# Patient Record
Sex: Male | Born: 1940 | Race: White | Hispanic: No | Marital: Married | Smoking: Former smoker
Health system: Southern US, Community
[De-identification: ages and names within clinical notes are randomized; demographics above are authoritative.]

## PROBLEM LIST (undated history)

## (undated) DIAGNOSIS — I219 Acute myocardial infarction, unspecified: Secondary | ICD-10-CM

## (undated) DIAGNOSIS — K810 Acute cholecystitis: Secondary | ICD-10-CM

## (undated) DIAGNOSIS — Z9221 Personal history of antineoplastic chemotherapy: Secondary | ICD-10-CM

## (undated) DIAGNOSIS — I10 Essential (primary) hypertension: Secondary | ICD-10-CM

## (undated) DIAGNOSIS — I712 Thoracic aortic aneurysm, without rupture, unspecified: Secondary | ICD-10-CM

## (undated) DIAGNOSIS — E7800 Pure hypercholesterolemia, unspecified: Secondary | ICD-10-CM

## (undated) DIAGNOSIS — I639 Cerebral infarction, unspecified: Secondary | ICD-10-CM

## (undated) DIAGNOSIS — Q2112 Patent foramen ovale: Secondary | ICD-10-CM

## (undated) DIAGNOSIS — Z923 Personal history of irradiation: Secondary | ICD-10-CM

## (undated) DIAGNOSIS — C4492 Squamous cell carcinoma of skin, unspecified: Secondary | ICD-10-CM

## (undated) DIAGNOSIS — C4490 Unspecified malignant neoplasm of skin, unspecified: Secondary | ICD-10-CM

## (undated) DIAGNOSIS — C61 Malignant neoplasm of prostate: Secondary | ICD-10-CM

## (undated) DIAGNOSIS — R5383 Other fatigue: Secondary | ICD-10-CM

## (undated) DIAGNOSIS — S02609A Fracture of mandible, unspecified, initial encounter for closed fracture: Secondary | ICD-10-CM

## (undated) DIAGNOSIS — I2510 Atherosclerotic heart disease of native coronary artery without angina pectoris: Secondary | ICD-10-CM

## (undated) DIAGNOSIS — C4491 Basal cell carcinoma of skin, unspecified: Secondary | ICD-10-CM

## (undated) DIAGNOSIS — C109 Malignant neoplasm of oropharynx, unspecified: Principal | ICD-10-CM

## (undated) DIAGNOSIS — Q211 Atrial septal defect: Secondary | ICD-10-CM

## (undated) DIAGNOSIS — I739 Peripheral vascular disease, unspecified: Secondary | ICD-10-CM

## (undated) DIAGNOSIS — R202 Paresthesia of skin: Secondary | ICD-10-CM

## (undated) DIAGNOSIS — K819 Cholecystitis, unspecified: Secondary | ICD-10-CM

## (undated) DIAGNOSIS — R200 Anesthesia of skin: Secondary | ICD-10-CM

## (undated) HISTORY — DX: Basal cell carcinoma of skin, unspecified: C44.91

## (undated) HISTORY — DX: Malignant neoplasm of oropharynx, unspecified: C10.9

## (undated) HISTORY — PX: MULTIPLE TOOTH EXTRACTIONS: SHX2053

## (undated) HISTORY — PX: PEG TUBE PLACEMENT: SUR1034

## (undated) HISTORY — DX: Acute cholecystitis: K81.0

## (undated) HISTORY — DX: Malignant neoplasm of prostate: C61

## (undated) HISTORY — PX: ELBOW SURGERY: SHX618

## (undated) HISTORY — DX: Personal history of irradiation: Z92.3

## (undated) HISTORY — DX: Squamous cell carcinoma of skin, unspecified: C44.92

## (undated) HISTORY — PX: CATARACT EXTRACTION W/ INTRAOCULAR LENS  IMPLANT, BILATERAL: SHX1307

## (undated) HISTORY — DX: Atherosclerotic heart disease of native coronary artery without angina pectoris: I25.10

## (undated) HISTORY — DX: Unspecified malignant neoplasm of skin, unspecified: C44.90

## (undated) HISTORY — DX: Cholecystitis, unspecified: K81.9

## (undated) HISTORY — PX: TONSILLECTOMY: SUR1361

## (undated) HISTORY — DX: Cerebral infarction, unspecified: I63.9

## (undated) HISTORY — DX: Other fatigue: R53.83

---

## 1989-09-17 HISTORY — PX: CORONARY ARTERY BYPASS GRAFT: SHX141

## 1998-10-20 ENCOUNTER — Ambulatory Visit (HOSPITAL_COMMUNITY): Admission: RE | Admit: 1998-10-20 | Discharge: 1998-10-20 | Payer: Self-pay | Admitting: Family Medicine

## 1998-10-20 ENCOUNTER — Encounter: Payer: Self-pay | Admitting: Family Medicine

## 2000-01-24 ENCOUNTER — Ambulatory Visit (HOSPITAL_COMMUNITY): Admission: RE | Admit: 2000-01-24 | Discharge: 2000-01-24 | Payer: Self-pay | Admitting: Gastroenterology

## 2000-05-21 ENCOUNTER — Other Ambulatory Visit: Admission: RE | Admit: 2000-05-21 | Discharge: 2000-05-21 | Payer: Self-pay | Admitting: Plastic Surgery

## 2000-05-21 ENCOUNTER — Encounter (INDEPENDENT_AMBULATORY_CARE_PROVIDER_SITE_OTHER): Payer: Self-pay

## 2000-09-17 DIAGNOSIS — C61 Malignant neoplasm of prostate: Secondary | ICD-10-CM

## 2000-09-17 HISTORY — DX: Malignant neoplasm of prostate: C61

## 2000-09-17 HISTORY — PX: PROSTATECTOMY: SHX69

## 2000-12-23 ENCOUNTER — Ambulatory Visit (HOSPITAL_COMMUNITY): Admission: RE | Admit: 2000-12-23 | Discharge: 2000-12-23 | Payer: Self-pay | Admitting: Urology

## 2000-12-23 ENCOUNTER — Encounter: Payer: Self-pay | Admitting: Urology

## 2000-12-27 ENCOUNTER — Ambulatory Visit (HOSPITAL_COMMUNITY): Admission: RE | Admit: 2000-12-27 | Discharge: 2000-12-27 | Payer: Self-pay | Admitting: Urology

## 2000-12-27 ENCOUNTER — Encounter: Payer: Self-pay | Admitting: Urology

## 2001-01-14 ENCOUNTER — Inpatient Hospital Stay (HOSPITAL_COMMUNITY): Admission: RE | Admit: 2001-01-14 | Discharge: 2001-01-17 | Payer: Self-pay | Admitting: Urology

## 2001-01-14 ENCOUNTER — Encounter (INDEPENDENT_AMBULATORY_CARE_PROVIDER_SITE_OTHER): Payer: Self-pay | Admitting: Specialist

## 2002-09-11 ENCOUNTER — Encounter: Payer: Self-pay | Admitting: Family Medicine

## 2002-09-11 ENCOUNTER — Ambulatory Visit (HOSPITAL_COMMUNITY): Admission: RE | Admit: 2002-09-11 | Discharge: 2002-09-11 | Payer: Self-pay | Admitting: Family Medicine

## 2005-09-17 DIAGNOSIS — I639 Cerebral infarction, unspecified: Secondary | ICD-10-CM

## 2005-09-17 HISTORY — DX: Cerebral infarction, unspecified: I63.9

## 2007-04-09 ENCOUNTER — Ambulatory Visit: Payer: Self-pay | Admitting: Cardiology

## 2007-05-12 ENCOUNTER — Ambulatory Visit: Payer: Self-pay

## 2007-05-12 ENCOUNTER — Encounter: Payer: Self-pay | Admitting: Cardiology

## 2007-05-22 ENCOUNTER — Ambulatory Visit: Payer: Self-pay | Admitting: Cardiology

## 2007-05-22 LAB — CONVERTED CEMR LAB
ALT: 24 units/L (ref 0–53)
AST: 24 units/L (ref 0–37)
Albumin: 3.5 g/dL (ref 3.5–5.2)
Alkaline Phosphatase: 50 units/L (ref 39–117)
BUN: 22 mg/dL (ref 6–23)
Bilirubin, Direct: 0.1 mg/dL (ref 0.0–0.3)
CO2: 28 meq/L (ref 19–32)
Calcium: 9.2 mg/dL (ref 8.4–10.5)
Chloride: 108 meq/L (ref 96–112)
Cholesterol: 131 mg/dL (ref 0–200)
Creatinine, Ser: 1.1 mg/dL (ref 0.4–1.5)
GFR calc Af Amer: 86 mL/min
GFR calc non Af Amer: 71 mL/min
Glucose, Bld: 101 mg/dL — ABNORMAL HIGH (ref 70–99)
HDL: 53.4 mg/dL (ref >39.0)
LDL Cholesterol: 68 mg/dL (ref 0–99)
Potassium: 4.2 meq/L (ref 3.5–5.1)
Sodium: 140 meq/L (ref 135–145)
Total Bilirubin: 1.1 mg/dL (ref 0.3–1.2)
Total CHOL/HDL Ratio: 2.5
Total Protein: 6.2 g/dL (ref 6.0–8.3)
Triglycerides: 48 mg/dL (ref 0–149)
VLDL: 10 mg/dL (ref 0–40)

## 2007-05-27 ENCOUNTER — Ambulatory Visit: Payer: Self-pay | Admitting: Cardiology

## 2007-08-12 ENCOUNTER — Ambulatory Visit: Payer: Self-pay | Admitting: Cardiology

## 2007-09-04 ENCOUNTER — Ambulatory Visit: Payer: Self-pay | Admitting: Cardiology

## 2008-02-18 ENCOUNTER — Ambulatory Visit: Payer: Self-pay | Admitting: Cardiology

## 2008-03-02 ENCOUNTER — Encounter: Admission: RE | Admit: 2008-03-02 | Discharge: 2008-03-02 | Payer: Self-pay | Admitting: Neurology

## 2008-04-19 ENCOUNTER — Encounter: Payer: Self-pay | Admitting: Cardiology

## 2008-04-19 ENCOUNTER — Ambulatory Visit: Payer: Self-pay | Admitting: Internal Medicine

## 2008-04-19 ENCOUNTER — Ambulatory Visit (HOSPITAL_COMMUNITY): Admission: RE | Admit: 2008-04-19 | Discharge: 2008-04-19 | Payer: Self-pay | Admitting: Cardiology

## 2008-04-19 ENCOUNTER — Encounter: Payer: Self-pay | Admitting: Internal Medicine

## 2008-07-20 ENCOUNTER — Ambulatory Visit: Payer: Self-pay | Admitting: Cardiology

## 2008-07-20 LAB — CONVERTED CEMR LAB
BUN: 23 mg/dL (ref 6–23)
CO2: 28 meq/L (ref 19–32)
Calcium: 9.1 mg/dL (ref 8.4–10.5)
Chloride: 107 meq/L (ref 96–112)
Creatinine, Ser: 1 mg/dL (ref 0.4–1.5)
GFR calc Af Amer: 96 mL/min
GFR calc non Af Amer: 79 mL/min
Glucose, Bld: 91 mg/dL (ref 70–99)
Potassium: 4.6 meq/L (ref 3.5–5.1)
Sodium: 141 meq/L (ref 135–145)

## 2008-07-22 ENCOUNTER — Ambulatory Visit: Payer: Self-pay | Admitting: Cardiovascular Disease

## 2008-07-27 ENCOUNTER — Encounter: Admission: RE | Admit: 2008-07-27 | Discharge: 2008-07-27 | Payer: Self-pay | Admitting: Orthopedic Surgery

## 2008-08-19 ENCOUNTER — Ambulatory Visit: Payer: Self-pay | Admitting: Cardiology

## 2008-11-18 ENCOUNTER — Ambulatory Visit: Payer: Self-pay | Admitting: Cardiology

## 2008-12-10 ENCOUNTER — Encounter: Payer: Self-pay | Admitting: Cardiology

## 2009-04-17 HISTORY — PX: CHOLECYSTECTOMY: SHX55

## 2009-05-13 ENCOUNTER — Emergency Department (HOSPITAL_COMMUNITY): Admission: EM | Admit: 2009-05-13 | Discharge: 2009-05-13 | Payer: Self-pay | Admitting: Emergency Medicine

## 2009-05-13 ENCOUNTER — Encounter: Admission: RE | Admit: 2009-05-13 | Discharge: 2009-05-13 | Payer: Self-pay | Admitting: Family Medicine

## 2009-05-16 ENCOUNTER — Inpatient Hospital Stay (HOSPITAL_COMMUNITY): Admission: AD | Admit: 2009-05-16 | Discharge: 2009-05-18 | Payer: Self-pay

## 2009-05-17 ENCOUNTER — Encounter (INDEPENDENT_AMBULATORY_CARE_PROVIDER_SITE_OTHER): Payer: Self-pay

## 2009-05-23 ENCOUNTER — Emergency Department (HOSPITAL_COMMUNITY): Admission: EM | Admit: 2009-05-23 | Discharge: 2009-05-23 | Payer: Self-pay | Admitting: Emergency Medicine

## 2009-07-10 ENCOUNTER — Inpatient Hospital Stay (HOSPITAL_COMMUNITY): Admission: EM | Admit: 2009-07-10 | Discharge: 2009-07-16 | Payer: Self-pay | Admitting: Emergency Medicine

## 2009-07-28 ENCOUNTER — Encounter: Admission: RE | Admit: 2009-07-28 | Discharge: 2009-07-28 | Payer: Self-pay | Admitting: General Surgery

## 2009-08-17 ENCOUNTER — Encounter: Admission: RE | Admit: 2009-08-17 | Discharge: 2009-08-17 | Payer: Self-pay | Admitting: Family Medicine

## 2009-08-30 ENCOUNTER — Ambulatory Visit (HOSPITAL_COMMUNITY): Admission: RE | Admit: 2009-08-30 | Discharge: 2009-08-30 | Payer: Self-pay | Admitting: General Surgery

## 2009-11-23 ENCOUNTER — Encounter (INDEPENDENT_AMBULATORY_CARE_PROVIDER_SITE_OTHER): Payer: Self-pay | Admitting: *Deleted

## 2009-11-23 DIAGNOSIS — E785 Hyperlipidemia, unspecified: Secondary | ICD-10-CM | POA: Insufficient documentation

## 2009-11-23 DIAGNOSIS — Z9089 Acquired absence of other organs: Secondary | ICD-10-CM | POA: Insufficient documentation

## 2009-11-23 DIAGNOSIS — R071 Chest pain on breathing: Secondary | ICD-10-CM | POA: Insufficient documentation

## 2009-11-23 DIAGNOSIS — R5381 Other malaise: Secondary | ICD-10-CM | POA: Insufficient documentation

## 2009-11-23 DIAGNOSIS — R5383 Other fatigue: Secondary | ICD-10-CM

## 2009-11-24 DIAGNOSIS — Z951 Presence of aortocoronary bypass graft: Secondary | ICD-10-CM | POA: Insufficient documentation

## 2009-11-24 DIAGNOSIS — Z8546 Personal history of malignant neoplasm of prostate: Secondary | ICD-10-CM | POA: Insufficient documentation

## 2009-11-24 DIAGNOSIS — Z8679 Personal history of other diseases of the circulatory system: Secondary | ICD-10-CM | POA: Insufficient documentation

## 2009-11-24 DIAGNOSIS — I252 Old myocardial infarction: Secondary | ICD-10-CM | POA: Insufficient documentation

## 2009-11-25 ENCOUNTER — Encounter: Admission: RE | Admit: 2009-11-25 | Discharge: 2009-11-25 | Payer: Self-pay | Admitting: Family Medicine

## 2009-11-28 ENCOUNTER — Ambulatory Visit: Payer: Self-pay | Admitting: Cardiology

## 2009-11-28 DIAGNOSIS — Q211 Atrial septal defect: Secondary | ICD-10-CM | POA: Insufficient documentation

## 2009-11-29 ENCOUNTER — Ambulatory Visit (HOSPITAL_COMMUNITY): Admission: RE | Admit: 2009-11-29 | Discharge: 2009-11-29 | Payer: Self-pay | Admitting: General Surgery

## 2009-11-29 ENCOUNTER — Encounter: Payer: Self-pay | Admitting: Infectious Disease

## 2009-11-29 DIAGNOSIS — K6819 Other retroperitoneal abscess: Secondary | ICD-10-CM | POA: Insufficient documentation

## 2009-12-05 ENCOUNTER — Encounter: Payer: Self-pay | Admitting: Infectious Disease

## 2009-12-07 ENCOUNTER — Encounter: Admission: RE | Admit: 2009-12-07 | Discharge: 2009-12-07 | Payer: Self-pay | Admitting: General Surgery

## 2009-12-07 ENCOUNTER — Encounter: Payer: Self-pay | Admitting: Internal Medicine

## 2009-12-12 ENCOUNTER — Encounter (INDEPENDENT_AMBULATORY_CARE_PROVIDER_SITE_OTHER): Payer: Self-pay | Admitting: *Deleted

## 2009-12-20 ENCOUNTER — Encounter: Admission: RE | Admit: 2009-12-20 | Discharge: 2009-12-20 | Payer: Self-pay | Admitting: General Surgery

## 2009-12-27 ENCOUNTER — Ambulatory Visit: Payer: Self-pay | Admitting: Internal Medicine

## 2009-12-28 ENCOUNTER — Encounter: Payer: Self-pay | Admitting: Internal Medicine

## 2010-01-02 ENCOUNTER — Encounter: Payer: Self-pay | Admitting: Infectious Disease

## 2010-01-22 ENCOUNTER — Telehealth: Payer: Self-pay | Admitting: Internal Medicine

## 2010-01-23 ENCOUNTER — Ambulatory Visit: Payer: Self-pay | Admitting: Internal Medicine

## 2010-01-23 LAB — CONVERTED CEMR LAB
BUN: 15 mg/dL (ref 6–23)
CO2: 23 meq/L (ref 19–32)
Calcium: 8.6 mg/dL (ref 8.4–10.5)
Chloride: 104 meq/L (ref 96–112)
Creatinine, Ser: 0.73 mg/dL (ref 0.40–1.50)
Glucose, Bld: 188 mg/dL — ABNORMAL HIGH (ref 70–99)
HCT: 36.8 % — ABNORMAL LOW (ref 39.0–52.0)
Hemoglobin: 11.4 g/dL — ABNORMAL LOW (ref 13.0–17.0)
MCHC: 31 g/dL (ref 30.0–36.0)
MCV: 81.4 fL (ref 78.0–100.0)
Platelets: 345 10*3/uL (ref 150–400)
Potassium: 4.1 meq/L (ref 3.5–5.3)
RBC: 4.52 M/uL (ref 4.22–5.81)
RDW: 16.4 % — ABNORMAL HIGH (ref 11.5–15.5)
Sodium: 138 meq/L (ref 135–145)
WBC: 9.5 10*3/uL (ref 4.0–10.5)

## 2010-01-24 ENCOUNTER — Encounter: Payer: Self-pay | Admitting: Internal Medicine

## 2010-01-31 ENCOUNTER — Telehealth (INDEPENDENT_AMBULATORY_CARE_PROVIDER_SITE_OTHER): Payer: Self-pay | Admitting: *Deleted

## 2010-02-07 ENCOUNTER — Encounter: Payer: Self-pay | Admitting: Internal Medicine

## 2010-03-15 ENCOUNTER — Encounter: Payer: Self-pay | Admitting: Internal Medicine

## 2010-03-23 ENCOUNTER — Telehealth (INDEPENDENT_AMBULATORY_CARE_PROVIDER_SITE_OTHER): Payer: Self-pay | Admitting: *Deleted

## 2010-04-17 HISTORY — PX: EXPLORATORY LAPAROTOMY: SUR591

## 2010-04-21 ENCOUNTER — Inpatient Hospital Stay (HOSPITAL_COMMUNITY): Admission: EM | Admit: 2010-04-21 | Discharge: 2010-05-01 | Payer: Self-pay | Admitting: Emergency Medicine

## 2010-04-21 ENCOUNTER — Encounter: Admission: RE | Admit: 2010-04-21 | Discharge: 2010-04-21 | Payer: Self-pay | Admitting: Family Medicine

## 2010-04-27 ENCOUNTER — Ambulatory Visit: Payer: Self-pay | Admitting: Internal Medicine

## 2010-05-02 ENCOUNTER — Encounter: Payer: Self-pay | Admitting: Internal Medicine

## 2010-05-05 ENCOUNTER — Encounter: Payer: Self-pay | Admitting: Internal Medicine

## 2010-05-09 ENCOUNTER — Telehealth: Payer: Self-pay | Admitting: Internal Medicine

## 2010-05-09 ENCOUNTER — Encounter: Payer: Self-pay | Admitting: Internal Medicine

## 2010-05-16 ENCOUNTER — Ambulatory Visit: Payer: Self-pay | Admitting: Internal Medicine

## 2010-05-24 ENCOUNTER — Encounter: Payer: Self-pay | Admitting: Internal Medicine

## 2010-05-30 ENCOUNTER — Ambulatory Visit: Payer: Self-pay | Admitting: Internal Medicine

## 2010-06-06 ENCOUNTER — Encounter: Payer: Self-pay | Admitting: Internal Medicine

## 2010-07-05 ENCOUNTER — Encounter: Payer: Self-pay | Admitting: Internal Medicine

## 2010-08-23 ENCOUNTER — Encounter: Payer: Self-pay | Admitting: Cardiology

## 2010-09-05 ENCOUNTER — Ambulatory Visit: Payer: Self-pay | Admitting: Cardiology

## 2010-09-05 ENCOUNTER — Encounter: Payer: Self-pay | Admitting: Cardiology

## 2010-09-05 DIAGNOSIS — I712 Thoracic aortic aneurysm, without rupture: Secondary | ICD-10-CM | POA: Insufficient documentation

## 2010-09-17 DIAGNOSIS — C109 Malignant neoplasm of oropharynx, unspecified: Secondary | ICD-10-CM

## 2010-09-17 HISTORY — DX: Malignant neoplasm of oropharynx, unspecified: C10.9

## 2010-10-08 ENCOUNTER — Encounter: Payer: Self-pay | Admitting: Orthopedic Surgery

## 2010-10-08 ENCOUNTER — Encounter: Payer: Self-pay | Admitting: Internal Medicine

## 2010-10-15 LAB — CONVERTED CEMR LAB
ALT: 21 units/L (ref 0–53)
AST: 17 units/L (ref 0–37)
Albumin: 3.7 g/dL (ref 3.5–5.2)
Alkaline Phosphatase: 65 units/L (ref 39–117)
Bilirubin, Direct: 0.1 mg/dL (ref 0.0–0.3)
Cholesterol: 182 mg/dL (ref 0–200)
HDL: 58.4 mg/dL (ref >39.0)
LDL Cholesterol: 107 mg/dL — ABNORMAL HIGH (ref 0–99)
Total Bilirubin: 1.3 mg/dL — ABNORMAL HIGH (ref 0.3–1.2)
Total CHOL/HDL Ratio: 3.1
Total Protein: 7.2 g/dL (ref 6.0–8.3)
Triglycerides: 81 mg/dL (ref 0–149)
VLDL: 16 mg/dL (ref 0–40)

## 2010-10-19 NOTE — Miscellaneous (Signed)
Summary: [facility]:  [facility]:   Imported By: Florinda Marker 06/08/2010 16:05:12  _____________________________________________________________________  External Attachment:    Type:   Image     Comment:   External Document

## 2010-10-19 NOTE — Assessment & Plan Note (Signed)
Summary: 2WKS F/U [MKJ]   Primary Provider:  Tiburcio Noble  CC:  follow-up visit, more energy, and appetite improving.  History of Present Illness: Jason Noble is in for his routine visit.  He continues to take Augmentin but his last dose was this morning.  He has not had any problems tolerating it.  He's not had any fever, chills, or sweats.  His appetite has improved and he has gained another 5 pounds.  He is not had any further abdominal pain.   Prior Medication List:  AUGMENTIN 875-125 MG TABS (AMOXICILLIN-POT CLAVULANATE) Take 1 tablet by mouth two times a day LIPITOR 40 MG TABS (ATORVASTATIN CALCIUM) Take 1 tablet by mouth once a day per PCP, Dr. Dossie Noble IBUPROFEN 200 MG TABS (IBUPROFEN) Take 1 tablet by mouth every 6 hours per PCP, Dr. Dossie Noble CALCIUM 500 MG TABS (CALCIUM) Take 1 tablet by mouth two times a day VITAMIN C 500 MG TABS (ASCORBIC ACID) Take 1 tablet by mouth two times a day MULTIVITAMINS  TABS (MULTIPLE VITAMIN) Take 1 tablet by mouth once a day VITAMIN E 400 UNIT CAPS (VITAMIN E) Take 1 capsule by mouth two times a day GARLIC OIL 1000 MG CAPS (GARLIC) Take 1 capsule by mouth once a day * UBIG-50 CAPSULE Take 1 capsule by mouth once a day FISH OIL 1000 MG CAPS (OMEGA-3 FATTY ACIDS) Take 1 capsule by mouth two times a day VITAMIN D 2000 UNIT CAPS (CHOLECALCIFEROL) Take 1 capsule by mouth once a day VITAMIN B-2 100 MG TABS (RIBOFLAVIN) Take 1 tablet by mouth once a day GLUCOSAMINE-CHONDROITIN 500-400 MG CAPS (GLUCOSAMINE-CHONDROITIN) Take 1 capsule by mouth once a day PYCNOGENOL COMPLEX  TABS (MISC NATURAL PRODUCTS) Take 1 tablet by mouth once a day RED YEAST RICE EXTRACT 600 MG CAPS (RED YEAST RICE EXTRACT) Take 1 capsule by mouth once a day * MAX GXK (GLUTAMINE, N- ACETYL) two times a day TURMERIC CURCUMIN  CAPS (MISC NATURAL PRODUCTS) Take 1 capsule by mouth two times a day ASPIRIN 81 MG TBEC (ASPIRIN) Take one tablet by mouth daily   Current Allergies (reviewed  today): No known allergies  Vital Signs:  Patient profile:   70 year old male Height:      68 inches (172.72 cm) Weight:      172.5 pounds (78.41 kg) BMI:     26.32 Temp:     97.9 degrees F (36.61 degrees C) oral Pulse rate:   66 / minute BP sitting:   122 / 75  (left arm) Cuff size:   regular  Vitals Entered By: Jason Maduro RN (May 30, 2010 3:05 PM) CC: follow-up visit, more energy, appetite improving Is Patient Diabetic? No Pain Assessment Patient in pain? no      Nutritional Status BMI of 25 - 29 = overweight Nutritional Status Detail appetite "improving"  Have you ever been in a relationship where you felt threatened, hurt or afraid?not asked   Does patient need assistance? Functional Status Self care Ambulation Normal   Physical Exam  General:  alert and well-nourished.   Lungs:  normal breath sounds, no crackles, and no wheezes.   Heart:  normal rate, regular rhythm, and no murmur.   Abdomen:  The incision appears to be healing well without any signs of infection.  His abdomen is soft and nontender.   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 He will stop  Augmentin therapy today. Since he has had such a difficult time with recurring infection over the past year her I have agreed to have him travel on his upcoming trip to [place] with a two week supply of Augmentin.  He should restart it if he has a definite signs of recurrent infection prior to returning to this country.  He knows to call me if he has any concerns about recurrent infection before his next visit. Orders: Est. Patient Level III (16109)  Patient Instructions: 1)  Please schedule a follow-up appointment in 2 months.

## 2010-10-19 NOTE — Letter (Signed)
Summary: Telephone triage form  Telephone triage form   Imported By: Kassie Mends 10/31/2009 11:31:22  _____________________________________________________________________  External Attachment:    Type:   Image     Comment:   External Document

## 2010-10-19 NOTE — Consult Note (Signed)
Summary: [facility]  [facility]   Imported By: Florinda Marker 02/15/2010 13:59:34  _____________________________________________________________________  External Attachment:    Type:   Image     Comment:   External Document

## 2010-10-19 NOTE — Progress Notes (Signed)
Summary: Pt. not eatting, speak to MD  Phone Note Call from Patient Call back at Home Phone [phone]   Caller: Spouse Call For: Jason Noble, [facility] Reason for Call: Talk to Doctor Summary of Call: Wife called stating that her husband was not eating well since he's been on the Invance IV, she states that he is not getting in 500 calories a day. She  wants to know if she needs a return  appointment with Dr. Orvan Falconer, he is going to see the surgeon today. Initial call taken by: Starleen Arms CMA,  May 08, 2010 9:18 AM      Follow-up for Phone Call        [name] has been having severe problems with nausea since he left the hospital on IV Invanz. He saw Dr. Andrey Campanile yesterday who called me to discuss the situation.  We agreed to stop the Invanz and switch him to oral Augmentin.  He says he is already feeling much better with resolution of his nausea.  He has been able to eat today.  I asked his wife to call me tomorrow.  If he continues to do well I will have his PICC removed.  He will continue his Augmentin at least until his visit with me in one week. Follow-up by: Jason Asters MD,  May 09, 2010 12:06 PM    New/Updated Medications: AUGMENTIN 875-125 MG TABS (AMOXICILLIN-POT CLAVULANATE) Take 1 tablet by mouth two times a day  Appended Document: Pt. not eatting, speak to MD Spoke with [facility] RN.  She has already spoken with Dr. Orvan Falconer today.  She was instructed to pull the [facility].

## 2010-10-19 NOTE — Consult Note (Signed)
Summary: [facility]:   [facility]:   Imported By: Florinda Marker 03/08/2010 15:52:54  _____________________________________________________________________  External Attachment:    Type:   Image     Comment:   External Document

## 2010-10-19 NOTE — Progress Notes (Signed)
  Phone Note Call from Patient   Caller: Patient Reason for Call: Acute Illness Summary of Call: Feeling worse recently with return of R sided pain. Worried that infection is back. No fever. I will see him 5/9 at 10:45 AM. Initial call taken by: Cliffton Asters MD,  Jan 22, 2010 2:10 PM

## 2010-10-19 NOTE — Assessment & Plan Note (Addendum)
Summary: f21m   Visit Type:  Follow-up Primary Provider:  Tiburcio Pea  CC:  Some chest tightness.  History of Present Illness: Jason Noble is doing pretty well overall.  He has noted a lit bit of chest tightness.  He has been through an ordeal with regard to his abdominal surgery, but seems overall improved from that standpoint.  We discussed his coronary situation in detail, and his choice was to defer with early followup in the absence of significant symptoms.  We have had previous communication from neuro, and there has been no recommendation regarding PFO closure in light of his CNS event.  Also, with all that went on last year after his abdominal procedure he did not have follow up.  Generally he is doing pretty well.   Problems Prior to Update: 1)  Other Retroperitoneal Abscess  (ICD-567.38) 2)  Patent Foramen Ovale  (ICD-745.5) 3)  Cad, Artery Bypass Graft  (ICD-414.04) 4)  Myocardial Infarction, Hx of  (ICD-412) 5)  Chest Wall Pain, Acute  (ICD-786.52) 6)  Hypercholesterolemia  (ICD-272.0) 7)  Cerebrovascular Accident, Hx of  (ICD-V12.50) 8)  Cholecystectomy, Hx of  (ICD-V45.79) 9)  Malaise and Fatigue  (ICD-780.79) 10)  Prostate Cancer, Hx of  (ICD-V10.46)  Current Medications (verified): 1)  Lipitor 40 Mg Tabs (Atorvastatin Calcium) .... Take 1 Tablet By Mouth Once A Day Per Pcp, Dr. Dossie Arbour 2)  Ibuprofen 200 Mg Tabs (Ibuprofen) .... Take 1 Tablet By Mouth Every 6 Hours Per Pcp, Dr. Dossie Arbour 3)  Calcium 500 Mg Tabs (Calcium) .... Take 1 Tablet By Mouth Two Times A Day 4)  Vitamin C 500 Mg Tabs (Ascorbic Acid) .... Take 1 Tablet By Mouth Two Times A Day 5)  Multivitamins  Tabs (Multiple Vitamin) .... Take 1 Tablet By Mouth Once A Day 6)  Vitamin E 400 Unit Caps (Vitamin E) .... Take 1 Capsule By Mouth Two Times A Day 7)  Garlic Oil 1000 Mg Caps (Garlic) .... Take 1 Capsule By Mouth Once A Day 8)  Ubig-50 Capsule .... Take 1 Capsule By Mouth Once A Day 9)  Fish Oil 1000 Mg Caps  (Omega-3 Fatty Acids) .... Take 1 Capsule By Mouth Two Times A Day 10)  Vitamin D 2000 Unit Caps (Cholecalciferol) .... Take 1 Capsule By Mouth Once A Day 11)  Vitamin B-2 100 Mg Tabs (Riboflavin) .... Take 1 Tablet By Mouth Once A Day 12)  Glucosamine-Chondroitin 500-400 Mg Caps (Glucosamine-Chondroitin) .... Take 1 Capsule By Mouth Once A Day 13)  Pycnogenol Complex  Tabs (Misc Natural Products) .... Take 1 Tablet By Mouth Once A Day 14)  Red Yeast Rice Extract 600 Mg Caps (Red Yeast Rice Extract) .... Take 1 Capsule By Mouth Once A Day 15)  Max Gxk (Glutamine, N- Acetyl) .... Two Times A Day 16)  Turmeric Curcumin  Caps (Misc Natural Products) .... Take 1 Capsule By Mouth Two Times A Day 17)  Aspirin 81 Mg Tbec (Aspirin) .... Take One Tablet By Mouth Daily  Allergies (verified): No Known Drug Allergies  Past History:  Past Medical History: Current Problems:  CAD, ARTERY BYPASS GRAFT (ICD-414.04)- prior coronary   revascularization surgery in 1991 with saphenous vein graft   occlusion and successful percutaneous stenting in May 2008.  MYOCARDIAL INFARCTION, HX OF (ICD-412) CHEST WALL PAIN, ACUTE (ICD-786.52) HYPERCHOLESTEROLEMIA (ICD-272.0) CEREBROVASCULAR ACCIDENT, HX OF (ICD-V12.50) CHOLECYSTECTOMY, HX OF (ICD-V45.79) MALAISE AND FATIGUE (ICD-780.79) PROSTATE CANCER, HX OF (ICD-V10.46) Thoracic aortic enlargement  Vital Signs:  Patient profile:  70 year old male Height:      68 inches Weight:      190.75 pounds BMI:     29.11 Pulse rate:   64 / minute Pulse rhythm:   regular Resp:     18 per minute BP sitting:   154 / 80  (left arm) Cuff size:   large  Vitals Entered By: Vikki Ports (September 05, 2010 11:04 AM)  Physical Exam  General:  Well developed, well nourished, in no acute distress. Head:  normocephalic and atraumatic Eyes:  PERRLA/EOM intact; conjunctiva and lids normal. Chest Wall:  MS well healed Lungs:  Clear bilaterally to auscultation and  percussion. Heart:  PMI non displaced.  Normal S1 and S2.  No definite murmur noted.   Abdomen:  Bowel sounds positive; abdomen soft and non-tender without masses, organomegaly, or hernias noted. No hepatosplenomegaly. Pulses:  pulses normal in all 4 extremities Extremities:  No clubbing or cyanosis. Neurologic:  Alert and oriented x 3.   EKG  Procedure date:  09/05/2010  Findings:      NSR. WNL.  Impression & Recommendations:  Problem # 1:  CAD, ARTERY BYPASS GRAFT (ICD-414.04) Has some mild chest pain from time to time.  Not exertional.  He prefers to be seen in early followup, and will contact us in the case that any symptoms recur.   Has prior SVG intervention.   His updated medication list for this problem includes:    Aspirin Ec 325 Mg Tbec (Aspirin) .Marland Kitchen... Take one tablet by mouth daily  Orders: EKG w/ Interpretation (93000)  Problem # 2:  PATENT FORAMEN OVALE (ICD-745.5) Stable symptoms, without recurrent neurologic events.  Recommendation was for continued antiplatelet treatment.    Problem # 3:  HYPERCHOLESTEROLEMIA (ICD-272.0) Followed by Dr. Tiburcio Pea.  His updated medication list for this problem includes:    Lipitor 40 Mg Tabs (Atorvastatin calcium) .Marland Kitchen... Take 1 tablet by mouth once a day per pcp, dr. 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  Will need repeat study.   Patient Instructions: 1)  Your physician has recommended you make the following change in your medication: INCREASE Aspirin to 325mg  daily 2)  Your physician recommends that you have a FASTING lipid profile with your PCP.  3)  Your physician recommends that you schedule a follow-up appointment in: 2 MONTHS

## 2010-10-19 NOTE — Progress Notes (Signed)
Summary: Pt. canceled CT.  Going to [facility] to see Dr. Marilynn Rail  Phone Note Outgoing Call   Call placed by: Jennet Maduro RN,  Jan 31, 2010 12:06 PM Call placed to: Patient Action Taken: Phone Call Completed Summary of Call: RN called pt to determine status of CT appt.  Pt. stated that he cancelled the CT appt. after discussion w/ Dr. Andrey Campanile.  Pt. requested referral to [facility] to a "biliary" specialist.  Pt. to see Dr. Marilynn Rail @ [facility] 02/07/2010.  Dr. Orvan Falconer informed.  RN offered pt. opportunity to sign ROI for Dr. Blair Dolphin OV notes to be sent to Dr. Marilynn Rail.  Pt. to decide whether to sign ROI. Jennet Maduro RN  Jan 31, 2010 12:09 PM

## 2010-10-19 NOTE — Consult Note (Signed)
Summary: New Pt. Referral: Dr. Andrey Campanile  New Pt. Referral: Dr. Andrey Campanile   Imported By: Florinda Marker 12/29/2009 15:49:34  _____________________________________________________________________  External Attachment:    Type:   Image     Comment:   External Document

## 2010-10-19 NOTE — Assessment & Plan Note (Signed)
Summary: f1y  Medications Added VITAMIN E 400 UNIT CAPS (VITAMIN E) Take 1 capsule by mouth two times a day * UBIG-50 CAPSULE Take 1 capsule by mouth once a day MULTIVITAMINS  TABS (MULTIPLE VITAMIN) Take 1 tablet by mouth once a day FISH OIL 1000 MG CAPS (OMEGA-3 FATTY ACIDS) Take 1 capsule by mouth two times a day VITAMIN D 2000 UNIT CAPS (CHOLECALCIFEROL) Take 1 capsule by mouth once a day VITAMIN B-2 100 MG TABS (RIBOFLAVIN) Take 1 tablet by mouth once a day GLUCOSAMINE-CHONDROITIN 500-400 MG CAPS (GLUCOSAMINE-CHONDROITIN) Take 1 capsule by mouth once a day PYCNOGENOL COMPLEX  TABS (MISC NATURAL PRODUCTS) Take 1 tablet by mouth once a day RED YEAST RICE EXTRACT 600 MG CAPS (RED YEAST RICE EXTRACT) Take 1 capsule by mouth once a day * MAX GXK (GLUTAMINE, N- ACETYL) two times a day TURMERIC CURCUMIN  CAPS (MISC NATURAL PRODUCTS) Take 1 capsule by mouth two times a day ROYAL JELLY 200 MG CAPS (ROYAL JELLY) Take 1 capsule by mouth once a day AUGMENTIN 500-125 MG TABS (AMOXICILLIN-POT CLAVULANATE) Take 1 tablet by mouth two times a day ASPIRIN 81 MG TBEC (ASPIRIN) Take one tablet by mouth daily      Allergies Added: NKDA  Visit Type:  1 year follow up Primary Provider:  Tiburcio Pea  CC:  Irregular heart beat sometimes.  History of Present Illness: Has been having trouble since his orginal gallstone procedure.  Cardiac situation has been ok.  Was on antibiotics.  Had to have an additional surgery laparoscopic surgery.  Had to wait on having plavix.  He develped some fever.  He had night sweats, wtih fevers up to 103.9.  Was seen by Dr. Andrey Campanile and Dr. Tiburcio Pea.  His wbc then came down.  He was splitting wood with his son, and the next day he had pain in his right chest in the back.  Pain over R back is still hurting.  Had CT scan and found an abscess in right upper quadrant.  He is awaiting to go back and see Dr. Andrey Campanile at this time.  His fevers are down currently, and his wbc is apparently down.   While we were doing the office visit, Dr. Andrey Campanile called him on his cell phone.    Current Medications (verified): 1)  Lipitor 40 Mg Tabs (Atorvastatin Calcium) .... Take 1 Tablet By Mouth Once A Day Per Pcp, Dr. Dossie Arbour 2)  Ibuprofen 200 Mg Tabs (Ibuprofen) .... Take 1 Tablet By Mouth Every 6 Hours Per Pcp, Dr. Dossie Arbour 3)  Calcium 500 Mg Tabs (Calcium) .... Take 1 Tablet By Mouth Two Times A Day 4)  Vitamin C 500 Mg Tabs (Ascorbic Acid) .... Take 1 Tablet By Mouth Two Times A Day 5)  Multivitamins  Tabs (Multiple Vitamin) .... Take 1 Tablet By Mouth Once A Day 6)  Vitamin E 400 Unit Caps (Vitamin E) .... Take 1 Capsule By Mouth Two Times A Day 7)  Garlic Oil 1000 Mg Caps (Garlic) .... Take 1 Capsule By Mouth Once A Day 8)  Nitrostat 0.4 Mg Subl (Nitroglycerin) .Marland Kitchen.. 1 Tablet Under Tongue At Onset of Chest Pain; You May Repeat Every 5 Minutes For Up To 3 Doses. 9)  Ubig-50 Capsule .... Take 1 Capsule By Mouth Once A Day 10)  Multivitamins  Tabs (Multiple Vitamin) .... Take 1 Tablet By Mouth Once A Day 11)  Fish Oil 1000 Mg Caps (Omega-3 Fatty Acids) .... Take 1 Capsule By Mouth Two Times A Day  12)  Vitamin D 2000 Unit Caps (Cholecalciferol) .... Take 1 Capsule By Mouth Once A Day 13)  Vitamin B-2 100 Mg Tabs (Riboflavin) .... Take 1 Tablet By Mouth Once A Day 14)  Glucosamine-Chondroitin 500-400 Mg Caps (Glucosamine-Chondroitin) .... Take 1 Capsule By Mouth Once A Day 15)  Pycnogenol Complex  Tabs (Misc Natural Products) .... Take 1 Tablet By Mouth Once A Day 16)  Red Yeast Rice Extract 600 Mg Caps (Red Yeast Rice Extract) .... Take 1 Capsule By Mouth Once A Day 17)  Max Gxk (Glutamine, N- Acetyl) .... Two Times A Day 18)  Turmeric Curcumin  Caps (Misc Natural Products) .... Take 1 Capsule By Mouth Two Times A Day 19)  Royal Jelly 200 Mg Caps (Royal Jelly) .... Take 1 Capsule By Mouth Once A Day 20)  Augmentin 500-125 Mg Tabs (Amoxicillin-Pot Clavulanate) .... Take 1 Tablet By Mouth Two Times  A Day 21)  Aspirin 81 Mg Tbec (Aspirin) .... Take One Tablet By Mouth Daily  Allergies (verified): No Known Drug Allergies  Vital Signs:  Patient profile:   70 year old male Height:      68 inches Weight:      177.50 pounds BMI:     27.09 Pulse rate:   87 / minute Pulse rhythm:   regular Resp:     18 per minute BP sitting:   124 / 70  (left arm) Cuff size:   large  Vitals Entered By: Vikki Ports (November 28, 2009 2:21 PM)  Physical Exam  General:  Well developed, well nourished, in no acute distress. Head:  normocephalic and atraumatic Eyes:  PERRLA/EOM intact; conjunctiva and lids normal. Lungs:  Clear bilaterally to auscultation and percussion. Heart:  PMI non displaced.   Normal S1 and S2.  No S4 gallop.     EKG  Procedure date:  11/28/2009  Findings:      NSR.  Nonspecific ST abnormality.  Impression & Recommendations:  Problem # 1:  CAD, ARTERY BYPASS GRAFT (ICD-414.04)  The following medications were removed from the medication list:    Plavix 75 Mg Tabs (Clopidogrel bisulfate) .Marland Kitchen... Take one tablet by mouth daily His updated medication list for this problem includes:    Nitrostat 0.4 Mg Subl (Nitroglycerin) .Marland Kitchen... 1 tablet under tongue at onset of chest pain; you may repeat every 5 minutes for up to 3 doses.    Aspirin 81 Mg Tbec (Aspirin) .Marland Kitchen... Take one tablet by mouth daily  Orders: EKG w/ Interpretation (93000)  Problem # 2:  PATENT FORAMEN OVALE (ICD-745.5)  Has patent foramen with prior small CVA.  Was placed on antiplatelet agents.  Previously spoke with Dr. Pearlean Brownie, and he has no further episodes.    Orders: EKG w/ Interpretation (93000)  Patient Instructions: 1)  Your physician recommends that you continue on your current medications as directed. Please refer to the Current Medication list given to you today. 2)  Your physician wants you to follow-up in: 6 MONTHS.   You will receive a reminder letter in the mail two months in advance. If you don't  receive a letter, please call our office to schedule the follow-up appointment. 3)  Your physician has requested that you have an echocardiogram in 6 MONTHS.  Echocardiography is a painless test that uses sound waves to create images of your heart. It provides your doctor with information about the size and shape of your heart and how well your heart's chambers and valves are working.  This procedure takes  approximately one hour. There are no restrictions for this procedure.

## 2010-10-19 NOTE — Medication Information (Signed)
Summary: [facility]: RX  [facility]: RX   Imported By: Florinda Marker 05/10/2010 12:22:44  _____________________________________________________________________  External Attachment:    Type:   Image     Comment:   External Document

## 2010-10-19 NOTE — Miscellaneous (Signed)
Summary: [facility]: Home Health Cert. & Plan Of Care  [facility]: Home Health Cert. & Plan Of Care   Imported By: Florinda Marker 05/26/2010 13:58:02  _____________________________________________________________________  External Attachment:    Type:   Image     Comment:   External Document

## 2010-10-19 NOTE — Assessment & Plan Note (Signed)
Summary: 11:30 hsfu need chart/liver abscess   Primary Provider:  Tiburcio Pea  CC:  HSFU, taking augmentin now, and not causing problems with taste like the Invanz did while he was on that.  History of Present Illness: Jason Noble is in for his hospital follow-up visit.  He recently had another recurrence of his polymicrobial intra-abdominal abscess these and was rehospitalized.  He underwent exploratory laparotomy with drainage of the abscess he is on August 10.  He was discharged on IV Invanz but developed anorexia, nausea and intermittent vomiting as soon as he began it.  I spoke with Dr. Gaynelle Adu last week and we agreed to switch him to oral Augmentin.  Since that time he has felt much better.  He has regained his appetite and the nausea and vomiting have resolved.  He is back gaining weight. He has not had any fever or chills.   Prior Medication List:  AUGMENTIN 875-125 MG TABS (AMOXICILLIN-POT CLAVULANATE) Take 1 tablet by mouth two times a day LIPITOR 40 MG TABS (ATORVASTATIN CALCIUM) Take 1 tablet by mouth once a day per PCP, Dr. Dossie Arbour IBUPROFEN 200 MG TABS (IBUPROFEN) Take 1 tablet by mouth every 6 hours per PCP, Dr. Dossie Arbour CALCIUM 500 MG TABS (CALCIUM) Take 1 tablet by mouth two times a day VITAMIN C 500 MG TABS (ASCORBIC ACID) Take 1 tablet by mouth two times a day MULTIVITAMINS  TABS (MULTIPLE VITAMIN) Take 1 tablet by mouth once a day VITAMIN E 400 UNIT CAPS (VITAMIN E) Take 1 capsule by mouth two times a day GARLIC OIL 1000 MG CAPS (GARLIC) Take 1 capsule by mouth once a day * UBIG-50 CAPSULE Take 1 capsule by mouth once a day FISH OIL 1000 MG CAPS (OMEGA-3 FATTY ACIDS) Take 1 capsule by mouth two times a day VITAMIN D 2000 UNIT CAPS (CHOLECALCIFEROL) Take 1 capsule by mouth once a day VITAMIN B-2 100 MG TABS (RIBOFLAVIN) Take 1 tablet by mouth once a day GLUCOSAMINE-CHONDROITIN 500-400 MG CAPS (GLUCOSAMINE-CHONDROITIN) Take 1 capsule by mouth once a day PYCNOGENOL  COMPLEX  TABS (MISC NATURAL PRODUCTS) Take 1 tablet by mouth once a day RED YEAST RICE EXTRACT 600 MG CAPS (RED YEAST RICE EXTRACT) Take 1 capsule by mouth once a day * MAX GXK (GLUTAMINE, N- ACETYL) two times a day TURMERIC CURCUMIN  CAPS (MISC NATURAL PRODUCTS) Take 1 capsule by mouth two times a day ASPIRIN 81 MG TBEC (ASPIRIN) Take one tablet by mouth daily   Current Allergies (reviewed today): No known allergies  Vital Signs:  Patient profile:   70 year old male Height:      68 inches (172.72 cm) Weight:      167.5 pounds (76.14 kg) BMI:     25.56 Temp:     98.1 degrees F (36.72 degrees C) oral Pulse rate:   73 / minute BP sitting:   136 / 85  (left arm) Cuff size:   large  Vitals Entered By: Jennet Maduro RN (May 16, 2010 11:42 AM) CC: HSFU, taking augmentin now, not causing problems with taste like the Invanz did while he was on that Is Patient Diabetic? No Pain Assessment Patient in pain? no      Nutritional Status BMI of 25 - 29 = overweight Nutritional Status Detail qppetite "returning"  Have you ever been in a relationship where you felt threatened, hurt or afraid?not asked   Does patient need assistance? Functional Status Self care Ambulation Normal   Physical Exam  General:  alert and well-nourished.   Lungs:  normal breath sounds, no crackles, and no wheezes.   Heart:  normal rate, regular rhythm, and no murmur.   Abdomen:  PS Steri-Strips on his midline incision.  The incision appears to be healing well without any signs of infection.  His abdomen is soft and nontender.   Impression & Recommendations:  Problem # 1:  OTHER RETROPERITONEAL ABSCESS (ICD-567.38) He has had a very stubborn poly-microbic intra-abdominal infection probably related to his retained gallstones.  He is doing much better and I will continue his Augmentin for two more weeks. Orders: Est. Patient Level III (98119)  Patient Instructions: 1)  Please schedule a follow-up  appointment in 2 weeks.

## 2010-10-19 NOTE — Assessment & Plan Note (Signed)
Summary: f/u appt. per Dr. Orvan Falconer /dde   Primary Provider:  Tiburcio Pea  CC:  f/u from previous visit.  History of Present Illness: [name] called me yesterday to tell me that he has been feeling worse over the last 3 weeks.  He completed one month of Augmentin for his Klebsiella right retroperitoneal abscess on April 15.  At that time he was weak and had lost weight but was feeling much better.  Shortly after that visit he began to develop some aching pain in his right flank.  He noticed that it was worse when he laid on his right side or took a deep breath.  He has also had a few fleeting abdominal pains.  He has not had any fever that he is aware of but has had a few nights when he dampened his T-shirt.  He has also had a dry cough over the last few weeks but believes that the aching pain began after the cough started.  He has not had any sore throat, sinus congestion or shortness of breath. He past not had any nausea, vomiting, or diarrhea.   Current Allergies (reviewed today): No known allergies  Vital Signs:  Patient profile:   70 year old male Height:      68 inches (172.72 cm) Weight:      174.5 pounds (79.32 kg) BMI:     26.63 Temp:     98.8 degrees F (37.11 degrees C) oral Pulse rate:   81 / minute BP sitting:   138 / 80  (left arm) Cuff size:   regular  Vitals Entered By: Jennet Maduro RN (Jan 23, 2010 10:42 AM) CC: f/u from previous visit   Physical Exam  General:  alert and well-nourished.  his weight is unchanged from his last visit. Lungs:  normal breath sounds, no dullness, no crackles, and no wheezes.   Heart:  normal rate, regular rhythm, and no murmur.   Abdomen:  soft, non-tender, normal bowel sounds, no distention, and no masses.  his surgical incisions are well healed.   Impression & Recommendations:  Problem # 1:  OTHER RETROPERITONEAL ABSCESS (ICD-567.38) I am not sure if his abscess has recurred but I will repeat a CT scan to evaluate for  that possibility.  Although he thinks the dry cough again after the pain, it is also possible he has had a mild but persistent case of bronchitis that has triggered pleuritic pain unrelated to the past abscess. Orders: Est. Patient Level III (82956) T-CBC No Diff ([id number]) T-Basic Metabolic Panel ([id number]) CT with Contrast (CT w/ contrast)  Patient Instructions: 1)  I will call you with your CT scan results.

## 2010-10-19 NOTE — Medication Information (Signed)
Summary: [facility]   Imported By: Florinda Marker 05/12/2010 15:49:12  _____________________________________________________________________  External Attachment:    Type:   Image     Comment:   External Document

## 2010-10-19 NOTE — Consult Note (Signed)
Summary: [facility]  [facility]   Imported By: Florinda Marker 07/05/2010 09:13:26  _____________________________________________________________________  External Attachment:    Type:   Image     Comment:   External Document

## 2010-10-19 NOTE — Letter (Signed)
Summary: [facility]: Denial Notice  [facility]: Denial Notice   Imported By: Florinda Marker 05/12/2010 15:38:25  _____________________________________________________________________  External Attachment:    Type:   Image     Comment:   External Document

## 2010-10-19 NOTE — Miscellaneous (Signed)
Summary: HIPAA Restrictions  HIPAA Restrictions   Imported By: Florinda Marker 12/28/2009 16:10:08  _____________________________________________________________________  External Attachment:    Type:   Image     Comment:   External Document

## 2010-10-19 NOTE — Consult Note (Signed)
Summary: Consultation Report  Consultation Report   Imported By: Florinda Marker 01/02/2010 14:27:23  _____________________________________________________________________  External Attachment:    Type:   Image     Comment:   External Document

## 2010-10-19 NOTE — Miscellaneous (Signed)
Summary: [facility]: Health Cert. & Plan Of Care  [facility]: Health Cert. & Plan Of Care   Imported By: Florinda Marker 05/24/2010 14:41:32  _____________________________________________________________________  External Attachment:    Type:   Image     Comment:   External Document

## 2010-10-19 NOTE — Consult Note (Signed)
Summary: Dr. Andrey Campanile  Dr. Andrey Campanile   Imported By: Florinda Marker 03/29/2010 10:16:22  _____________________________________________________________________  External Attachment:    Type:   Image     Comment:   External Document

## 2010-10-19 NOTE — Miscellaneous (Signed)
Summary: Problem list updated  Clinical Lists Changes  Problems: Added new problem of OTHER RETROPERITONEAL ABSCESS (ICD-567.38) - posterior right hemidiaphragm Observations: Added new observation of NKA: T (12/12/2009 12:52)

## 2010-10-19 NOTE — Miscellaneous (Signed)
Summary: Problems, Medications and Allergies  Clinical Lists Changes  Problems: Added new problem of CHEST WALL PAIN, ACUTE (ICD-786.52) Added new problem of MALAISE AND FATIGUE (ICD-780.79) Added new problem of CAD (ICD-414.00) Added new problem of MI (ICD-410.90) - 2008, stent Added new problem of HYPERCHOLESTEROLEMIA (ICD-272.0) Added new problem of CVA (ICD-434.91) - 5/09 Added new problem of CHOLECYSTECTOMY, HX OF (ICD-V45.79) Medications: Added new medication of LIPITOR 40 MG TABS (ATORVASTATIN CALCIUM) Take 1 tablet by mouth once a day per PCP, Dr. Dossie Arbour Added new medication of IBUPROFEN 200 MG TABS (IBUPROFEN) Take 1 tablet by mouth every 6 hours per PCP, Dr. Dossie Arbour Observations: Added new observation of NKA: T (11/23/2009 15:40)

## 2010-10-19 NOTE — Assessment & Plan Note (Signed)
Summary: FEVERS/ABSCESSES/KAM   Primary Provider:  Tiburcio Pea  CC:  post gallbleadder syergery and 2nd surgery in January and drain out 12/20/2009.  History of Present Illness: [name] is a 70 year old who is referred to me by Dr. Gaynelle Adu for evaluation of a retroperitoneal abscess.  He underwent two surgeries last fall for cholecystitis and apparently had some spillage of small gallstones.  He was treated with amoxicillin for about one month in November and December and felt much better.  In January began to have fever and rigors. In February he began to have some aching, right posterior, pleuritic pain. On March 11 the CT scan showed a complex abscess in the right retroperitoneum just between the diaphragm and the liver.  On March 15 a percutaneous drain was placed.  Cultures revealed Klebsiella resistant to amoxicillin but sensitive to Augmentin.  He was started on Augmentin and felt better very quickly a follow-up CT scan last week showed that the abscess had resolved and the drain was pulled on April 5.  He's had no more fever or riders and his appetite is improving.  He has minimal residual discomfort at the site where the drain was.   Current Allergies (reviewed today): No known allergies  Vital Signs:  Patient profile:   70 year old male Height:      68 inches (172.72 cm) Weight:      174 pounds (79.09 kg) BMI:     26.55 Temp:     98.5 degrees F (36.94 degrees C) oral Pulse rate:   76 / minute BP sitting:   136 / 88  (left arm) Cuff size:   regular  Vitals Entered By: Jennet Maduro RN (December 27, 2009 2:01 PM) CC: post gallbleadder syergery and 2nd surgery in January, drain out 12/20/2009 Is Patient Diabetic? No Nutritional Status BMI of 25 - 29 = overweight  Have you ever been in a relationship where you felt threatened, hurt or afraid?not asked, wife present   Does patient need assistance? Functional Status Self care Ambulation Normal   Physical Exam  General:   alert and well-nourished.  he has lost about 20 to 25 pounds over the last several months Lungs:  normal breath sounds, no crackles, and no wheezes.  normal breath sounds, no crackles, and no wheezes.   Heart:  normal rate, regular rhythm, and no murmur.  normal rate, regular rhythm, and no murmur.   Abdomen:  soft, non-tender, normal bowel sounds, and no masses.  he has no CVA tenderness.  He has some residual redness around the previous drain sitesoft, non-tender, normal bowel sounds, and no masses.     Impression & Recommendations:  Problem # 1:  OTHER RETROPERITONEAL ABSCESS (ICD-567.38) I suspect that the abscess is cured after 3 weeks percutaneous drainage and nearly 1 month Augmentin therapy.  I will have him stop Augmentin when he runs out on April 15.  He knows to call me if he has any signs of recurrent infection. Orders: Consultation Level III (65784)  Medications Added to Medication List This Visit: 1)  Augmentin 500-125 Mg Tabs (Amoxicillin-pot clavulanate) .... Take 1 tablet by mouth two times a day  Patient Instructions: 1)  Please schedule a follow-up appointment as needed.

## 2010-10-19 NOTE — Progress Notes (Signed)
Summary: pt needs refill  Phone Note Refill Request Call back at Home Phone [phone] Message from:  Patient  Refills Requested: Medication #1:  LIPITOR 40 MG TABS Take 1 tablet by mouth once a day per PCP wondering he was denied for lipitor  Initial call taken by: Omer Jack,  March 23, 2010 11:33 AM  Follow-up for Phone Call        Rx called to [facility]  pharmacy Lipitor 80 mg. take 1/2 to 1 tablet daily.  # 90 plus 3 refills. Pt. notified Follow-up by: Vikki Ports,  March 23, 2010 1:45 PM

## 2010-10-20 NOTE — Consult Note (Signed)
Summary: New Pt. Referral: [facility]  New Pt. Referral: [facility]   Imported By: Florinda Marker 12/29/2009 15:51:34  _____________________________________________________________________  External Attachment:    Type:   Image     Comment:   External Document

## 2010-11-13 ENCOUNTER — Encounter: Payer: Self-pay | Admitting: Cardiology

## 2010-11-13 ENCOUNTER — Ambulatory Visit (INDEPENDENT_AMBULATORY_CARE_PROVIDER_SITE_OTHER): Payer: Medicare Other | Admitting: Cardiology

## 2010-11-13 DIAGNOSIS — I2510 Atherosclerotic heart disease of native coronary artery without angina pectoris: Secondary | ICD-10-CM

## 2010-11-13 DIAGNOSIS — E7800 Pure hypercholesterolemia, unspecified: Secondary | ICD-10-CM

## 2010-11-13 DIAGNOSIS — I712 Thoracic aortic aneurysm, without rupture: Secondary | ICD-10-CM

## 2010-11-14 NOTE — Letter (Signed)
Summary: [facility] - Office Note  [facility] - Office Note   Imported By: Marylou Mccoy 10/25/2010 15:57:40  _____________________________________________________________________  External Attachment:    Type:   Image     Comment:   External Document

## 2010-11-16 ENCOUNTER — Ambulatory Visit: Payer: Self-pay | Admitting: Cardiology

## 2010-11-23 NOTE — Assessment & Plan Note (Addendum)
Summary: f67m   Visit Type:  follow-up 2 months Primary Provider:  Tiburcio Pea  CC:  pt states he is feeling pretty good and has no complaints today.Marland Kitchen  History of Present Illness: Overall doing well.  Chest pain has resolved.  He and I discussed his prior CT scan, and he is worried about getting to much radiation.  He did not want the CT a year ago.   We have also briefly discussed stenting for ED, but I mentioned that was still in the air.  Denies any progressive symptoms.   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 Take 1 Tablet By Mouth Once A Day Per Pcp, Dr. Dossie Arbour 2)  Calcium 500 Mg Tabs (Calcium) .... Take 1 Tablet By Mouth Two Times A Day 3)  Vitamin C 500 Mg Tabs (Ascorbic Acid) .... Take 1 Tablet By Mouth Two Times A Day 4)  Multivitamins  Tabs (Multiple Vitamin) .... Take 1 Tablet By Mouth Once A Day 5)  Vitamin E 400 Unit Caps (Vitamin E) .... Take 1 Capsule By Mouth Once Time A Day 6)  Garlic Oil 1000 Mg Caps (Garlic) .... Take 1 Capsule By Mouth Once A Day 7)  Ubig-50 Capsule .... Take 1 Capsule By Mouth Once A Day 8)  Fish Oil 1000 Mg Caps (Omega-3 Fatty Acids) .... Take 1 Capsule By Mouth Two Times A Day 9)  Vitamin D 2000 Unit Caps (Cholecalciferol) .... Take 1 Capsule By Mouth Once A Day 10)  Glucosamine-Chondroitin 500-400 Mg Caps (Glucosamine-Chondroitin) .... Take 1 Capsule By Mouth Once A Day 11)  Pycnogenol Complex  Tabs (Misc Natural  Products) .... Take 1 Tablet By Mouth Once A Day 12)  Red Yeast Rice Extract 600 Mg Caps (Red Yeast Rice Extract) .... Take 1 Capsule By Mouth Once A Day 13)  Max Gxk (Glutamine, N- Acetyl) .... Two Times A Day 14)  Turmeric Curcumin  Caps (Misc Natural Products) .... Take 1 Capsule By Mouth Two Times A Day 15)  Aspirin Ec 325 Mg Tbec (Aspirin) .... Take One Tablet By Mouth Daily 16)  B-50 Cr  Cr-Tabs (B Complex-Folic Acid) .... Take 1 Tablet By Mouth Once A Day 17)  Sudafed 30 Mg Tabs (Pseudoephedrine Hcl) .... 2 Tabs 2 Times Per Day As Needed  Allergies (verified): No Known Drug Allergies  Past History:  Past Medical History: Last updated: 09/05/2010 Current Problems:  CAD, ARTERY BYPASS GRAFT (ICD-414.04)- prior coronary   revascularization surgery in 1991 with saphenous vein graft   occlusion and successful percutaneous stenting in May 2008.  MYOCARDIAL INFARCTION, HX OF (ICD-412) CHEST WALL PAIN, ACUTE (ICD-786.52) HYPERCHOLESTEROLEMIA (ICD-272.0) CEREBROVASCULAR ACCIDENT, HX OF (ICD-V12.50) CHOLECYSTECTOMY, HX OF (ICD-V45.79) MALAISE AND FATIGUE (ICD-780.79) PROSTATE CANCER, HX OF (ICD-V10.46) Thoracic aortic enlargement  Past Surgical History: Last updated: 11/24/2009  coronary   revascularization surgery in 1991 with saphenous vein graft    occlusion and successful percutaneous stenting in May 2008.    Prostatectomy.  Coronary artery bypass grafting.    Laparoscopic cholecystectomy.   Tonsillectomy.      Family History: Last updated: 12/07/2009 Mother died at age 86 Heart attack following surgery for colon resection. Father with hx of migraine and headaches Brother who had CABG procedure Sister who has spinal stenosis.  Social History: Last updated: 2009/12/07  He is married, here with his wife and his family.  He   is a former smoker but denies alcohol use.      Vital Signs:  Patient profile:   70 year old male Height:      68 inches Weight:      189  pounds BMI:     28.84 Pulse rate:   60 / minute Resp:     17 per minute BP sitting:   132 / 78  (left arm) Cuff size:   regular  Vitals Entered By: Celestia Khat, CMA (November 13, 2010 2:14 PM)  Physical Exam  General:  Well developed, well nourished, in no acute distress. Head:  normocephalic and atraumatic Eyes:  PERRLA/EOM intact; conjunctiva and lids normal. Lungs:  Clear bilaterally to auscultation and percussion. Heart:  PMI non displaced.  NOrmal S1 and S2.  No murmur, rub, or gallop.   Abdomen:  Bowel sounds positive; abdomen soft and non-tender without masses, organomegaly, or hernias noted. No hepatosplenomegaly. Pulses:  pulses normal in all 4 extremities Extremities:  No clubbing or cyanosis. Neurologic:  Alert and oriented x 3.   CT Scan  Procedure date:  07/22/2008  Findings:       IMPRESSION:    1.  Aneurysm of the ascending thoracic aorta measuring 4.4 x 4.1 cm   in maximum dimensions near the aortic root.  This is minimally   increased from 4.2 x 4.0 cm on prior study approximately 1 year   ago.   2.  Stable mild cardiomegaly.  No active lung disease or other   acute findings.   3.  High attenuation gallbladder sludge incidentally noted; tiny   gallstones cannot be excluded..    Read By:  Danae Orleans,  M.D.  Impression & Recommendations:  Problem # 1:  CAD, ARTERY BYPASS GRAFT (ICD-414.04) symtoms seem stable at present.  Currently off of Plavix.  Happy with current situation. His updated medication list for this problem includes:    Aspirin Ec 325 Mg Tbec (Aspirin) .Marland Kitchen... Take one tablet by mouth daily  Orders: EKG w/ Interpretation (93000)  Problem # 2:  ANEURYSM, THORACIC AORTIC (ICD-441.2) See last CT result.  He is still concerened about radiation.  Will investigate that.    Problem # 3:  HYPERCHOLESTEROLEMIA (ICD-272.0) rationale for agressive lipid management reviewed with patient.  He understands, and is willing to get his numbers  checked at Dr. Tiburcio Pea' office which I endorsed.  His updated medication list for this problem includes:    Lipitor 40 Mg Tabs (Atorvastatin calcium) .Marland Kitchen... Take 1 tablet by mouth once a day  Patient Instructions: 1)  Your physician recommends that you have your cholesterol checked with your Primary Care Provider.  2)  Your physician wants you to follow-up in: 6 MONTHS.  You will receive a reminder letter in the mail two months in advance. If you don't receive a letter, please call our office to schedule the follow-up appointment. 3)  Your physician recommends that you continue on your current medications as directed. Please refer to the Current Medication list given to you today.  Appended Document: f72m Of note, patient  has a PFO which has not been closed, and they have recommended observation.

## 2010-11-27 ENCOUNTER — Encounter: Payer: Self-pay | Admitting: *Deleted

## 2010-11-28 ENCOUNTER — Other Ambulatory Visit: Payer: Self-pay | Admitting: Dermatology

## 2010-12-01 LAB — CBC
HCT: 29.7 % — ABNORMAL LOW (ref 39.0–52.0)
HCT: 30.1 % — ABNORMAL LOW (ref 39.0–52.0)
HCT: 30.5 % — ABNORMAL LOW (ref 39.0–52.0)
HCT: 31 % — ABNORMAL LOW (ref 39.0–52.0)
HCT: 31.2 % — ABNORMAL LOW (ref 39.0–52.0)
HCT: 31.7 % — ABNORMAL LOW (ref 39.0–52.0)
HCT: 32.9 % — ABNORMAL LOW (ref 39.0–52.0)
HCT: 36.2 % — ABNORMAL LOW (ref 39.0–52.0)
Hemoglobin: 10 g/dL — ABNORMAL LOW (ref 13.0–17.0)
Hemoglobin: 10 g/dL — ABNORMAL LOW (ref 13.0–17.0)
Hemoglobin: 10.5 g/dL — ABNORMAL LOW (ref 13.0–17.0)
Hemoglobin: 11.8 g/dL — ABNORMAL LOW (ref 13.0–17.0)
Hemoglobin: 9.4 g/dL — ABNORMAL LOW (ref 13.0–17.0)
Hemoglobin: 9.6 g/dL — ABNORMAL LOW (ref 13.0–17.0)
Hemoglobin: 9.7 g/dL — ABNORMAL LOW (ref 13.0–17.0)
Hemoglobin: 9.9 g/dL — ABNORMAL LOW (ref 13.0–17.0)
MCH: 25.3 pg — ABNORMAL LOW (ref 26.0–34.0)
MCH: 25.5 pg — ABNORMAL LOW (ref 26.0–34.0)
MCH: 25.5 pg — ABNORMAL LOW (ref 26.0–34.0)
MCH: 25.8 pg — ABNORMAL LOW (ref 26.0–34.0)
MCH: 25.8 pg — ABNORMAL LOW (ref 26.0–34.0)
MCH: 25.9 pg — ABNORMAL LOW (ref 26.0–34.0)
MCH: 25.9 pg — ABNORMAL LOW (ref 26.0–34.0)
MCH: 26.2 pg (ref 26.0–34.0)
MCHC: 31.2 g/dL (ref 30.0–36.0)
MCHC: 31.5 g/dL (ref 30.0–36.0)
MCHC: 31.6 g/dL (ref 30.0–36.0)
MCHC: 31.9 g/dL (ref 30.0–36.0)
MCHC: 32.1 g/dL (ref 30.0–36.0)
MCHC: 32.2 g/dL (ref 30.0–36.0)
MCHC: 32.3 g/dL (ref 30.0–36.0)
MCHC: 32.6 g/dL (ref 30.0–36.0)
MCV: 79 fL (ref 78.0–100.0)
MCV: 79.3 fL (ref 78.0–100.0)
MCV: 80.5 fL (ref 78.0–100.0)
MCV: 80.6 fL (ref 78.0–100.0)
MCV: 80.7 fL (ref 78.0–100.0)
MCV: 81.4 fL (ref 78.0–100.0)
MCV: 81.7 fL (ref 78.0–100.0)
MCV: 82.2 fL (ref 78.0–100.0)
Platelets: 252 10*3/uL (ref 150–400)
Platelets: 260 10*3/uL (ref 150–400)
Platelets: 263 10*3/uL (ref 150–400)
Platelets: 286 10*3/uL (ref 150–400)
Platelets: 292 10*3/uL (ref 150–400)
Platelets: 309 10*3/uL (ref 150–400)
Platelets: 319 10*3/uL (ref 150–400)
Platelets: ADEQUATE 10*3/uL (ref 150–400)
RBC: 3.68 MIL/uL — ABNORMAL LOW (ref 4.22–5.81)
RBC: 3.71 MIL/uL — ABNORMAL LOW (ref 4.22–5.81)
RBC: 3.74 MIL/uL — ABNORMAL LOW (ref 4.22–5.81)
RBC: 3.81 MIL/uL — ABNORMAL LOW (ref 4.22–5.81)
RBC: 3.87 MIL/uL — ABNORMAL LOW (ref 4.22–5.81)
RBC: 3.88 MIL/uL — ABNORMAL LOW (ref 4.22–5.81)
RBC: 4.15 MIL/uL — ABNORMAL LOW (ref 4.22–5.81)
RBC: 4.58 MIL/uL (ref 4.22–5.81)
RDW: 15.4 % (ref 11.5–15.5)
RDW: 15.6 % — ABNORMAL HIGH (ref 11.5–15.5)
RDW: 15.7 % — ABNORMAL HIGH (ref 11.5–15.5)
RDW: 15.7 % — ABNORMAL HIGH (ref 11.5–15.5)
RDW: 15.7 % — ABNORMAL HIGH (ref 11.5–15.5)
RDW: 15.8 % — ABNORMAL HIGH (ref 11.5–15.5)
RDW: 16 % — ABNORMAL HIGH (ref 11.5–15.5)
RDW: 16.1 % — ABNORMAL HIGH (ref 11.5–15.5)
WBC: 10.1 10*3/uL (ref 4.0–10.5)
WBC: 14.6 10*3/uL — ABNORMAL HIGH (ref 4.0–10.5)
WBC: 17.2 10*3/uL — ABNORMAL HIGH (ref 4.0–10.5)
WBC: 17.6 10*3/uL — ABNORMAL HIGH (ref 4.0–10.5)
WBC: 18.2 10*3/uL — ABNORMAL HIGH (ref 4.0–10.5)
WBC: 8.1 10*3/uL (ref 4.0–10.5)
WBC: 8.2 10*3/uL (ref 4.0–10.5)
WBC: 9.4 10*3/uL (ref 4.0–10.5)

## 2010-12-01 LAB — COMPREHENSIVE METABOLIC PANEL
ALT: 14 U/L (ref 0–53)
AST: 14 U/L (ref 0–37)
Albumin: 2.1 g/dL — ABNORMAL LOW (ref 3.5–5.2)
Alkaline Phosphatase: 74 U/L (ref 39–117)
BUN: 12 mg/dL (ref 6–23)
CO2: 26 mEq/L (ref 19–32)
Calcium: 8.5 mg/dL (ref 8.4–10.5)
Chloride: 108 mEq/L (ref 96–112)
Creatinine, Ser: 0.86 mg/dL (ref 0.4–1.5)
GFR calc Af Amer: >60 mL/min (ref >60)
GFR calc non Af Amer: >60 mL/min (ref >60)
Glucose, Bld: 86 mg/dL (ref 70–99)
Potassium: 3.9 mEq/L (ref 3.5–5.1)
Sodium: 138 mEq/L (ref 135–145)
Total Bilirubin: 0.4 mg/dL (ref 0.3–1.2)
Total Protein: 5.6 g/dL — ABNORMAL LOW (ref 6.0–8.3)

## 2010-12-01 LAB — BASIC METABOLIC PANEL
BUN: 13 mg/dL (ref 6–23)
BUN: 15 mg/dL (ref 6–23)
BUN: 15 mg/dL (ref 6–23)
BUN: 18 mg/dL (ref 6–23)
BUN: 8 mg/dL (ref 6–23)
BUN: 8 mg/dL (ref 6–23)
CO2: 22 mEq/L (ref 19–32)
CO2: 23 mEq/L (ref 19–32)
CO2: 24 mEq/L (ref 19–32)
CO2: 25 mEq/L (ref 19–32)
CO2: 27 mEq/L (ref 19–32)
CO2: 29 mEq/L (ref 19–32)
Calcium: 7.5 mg/dL — ABNORMAL LOW (ref 8.4–10.5)
Calcium: 7.9 mg/dL — ABNORMAL LOW (ref 8.4–10.5)
Calcium: 8.1 mg/dL — ABNORMAL LOW (ref 8.4–10.5)
Calcium: 8.4 mg/dL (ref 8.4–10.5)
Calcium: 8.5 mg/dL (ref 8.4–10.5)
Calcium: 8.7 mg/dL (ref 8.4–10.5)
Chloride: 103 mEq/L (ref 96–112)
Chloride: 105 mEq/L (ref 96–112)
Chloride: 105 mEq/L (ref 96–112)
Chloride: 106 mEq/L (ref 96–112)
Chloride: 107 mEq/L (ref 96–112)
Chloride: 97 mEq/L (ref 96–112)
Creatinine, Ser: 0.78 mg/dL (ref 0.4–1.5)
Creatinine, Ser: 0.89 mg/dL (ref 0.4–1.5)
Creatinine, Ser: 0.96 mg/dL (ref 0.4–1.5)
Creatinine, Ser: 1.05 mg/dL (ref 0.4–1.5)
Creatinine, Ser: 1.09 mg/dL (ref 0.4–1.5)
Creatinine, Ser: 1.25 mg/dL (ref 0.4–1.5)
GFR calc Af Amer: >60 mL/min (ref >60)
GFR calc Af Amer: >60 mL/min (ref >60)
GFR calc Af Amer: >60 mL/min (ref >60)
GFR calc Af Amer: >60 mL/min (ref >60)
GFR calc Af Amer: >60 mL/min (ref >60)
GFR calc Af Amer: >60 mL/min (ref >60)
GFR calc non Af Amer: 57 mL/min — ABNORMAL LOW (ref >60)
GFR calc non Af Amer: >60 mL/min (ref >60)
GFR calc non Af Amer: >60 mL/min (ref >60)
GFR calc non Af Amer: >60 mL/min (ref >60)
GFR calc non Af Amer: >60 mL/min (ref >60)
GFR calc non Af Amer: >60 mL/min (ref >60)
Glucose, Bld: 106 mg/dL — ABNORMAL HIGH (ref 70–99)
Glucose, Bld: 108 mg/dL — ABNORMAL HIGH (ref 70–99)
Glucose, Bld: 175 mg/dL — ABNORMAL HIGH (ref 70–99)
Glucose, Bld: 91 mg/dL (ref 70–99)
Glucose, Bld: 94 mg/dL (ref 70–99)
Glucose, Bld: 98 mg/dL (ref 70–99)
Potassium: 3.8 mEq/L (ref 3.5–5.1)
Potassium: 3.8 mEq/L (ref 3.5–5.1)
Potassium: 3.9 mEq/L (ref 3.5–5.1)
Potassium: 3.9 mEq/L (ref 3.5–5.1)
Potassium: 4 mEq/L (ref 3.5–5.1)
Potassium: 4.2 mEq/L (ref 3.5–5.1)
Sodium: 131 mEq/L — ABNORMAL LOW (ref 135–145)
Sodium: 135 mEq/L (ref 135–145)
Sodium: 138 mEq/L (ref 135–145)
Sodium: 139 mEq/L (ref 135–145)
Sodium: 139 mEq/L (ref 135–145)
Sodium: 139 mEq/L (ref 135–145)

## 2010-12-01 LAB — CROSSMATCH
ABO/RH(D): O POS
Antibody Screen: NEGATIVE

## 2010-12-01 LAB — DIFFERENTIAL
Basophils Absolute: 0 10*3/uL (ref 0.0–0.1)
Basophils Absolute: 0 10*3/uL (ref 0.0–0.1)
Basophils Relative: 0 % (ref 0–1)
Basophils Relative: 0 % (ref 0–1)
Eosinophils Absolute: 0 10*3/uL (ref 0.0–0.7)
Eosinophils Absolute: 0.1 10*3/uL (ref 0.0–0.7)
Eosinophils Relative: 0 % (ref 0–5)
Eosinophils Relative: 1 % (ref 0–5)
Lymphocytes Relative: 9 % — ABNORMAL LOW (ref 12–46)
Lymphocytes Relative: 9 % — ABNORMAL LOW (ref 12–46)
Lymphs Abs: 1.3 10*3/uL (ref 0.7–4.0)
Lymphs Abs: 1.6 10*3/uL (ref 0.7–4.0)
Monocytes Absolute: 0.7 10*3/uL (ref 0.1–1.0)
Monocytes Absolute: 2 10*3/uL — ABNORMAL HIGH (ref 0.1–1.0)
Monocytes Relative: 11 % (ref 3–12)
Monocytes Relative: 5 % (ref 3–12)
Neutro Abs: 12.5 10*3/uL — ABNORMAL HIGH (ref 1.7–7.7)
Neutro Abs: 14 10*3/uL — ABNORMAL HIGH (ref 1.7–7.7)
Neutrophils Relative %: 80 % — ABNORMAL HIGH (ref 43–77)
Neutrophils Relative %: 85 % — ABNORMAL HIGH (ref 43–77)

## 2010-12-01 LAB — POCT I-STAT 4, (NA,K, GLUC, HGB,HCT)
Glucose, Bld: 114 mg/dL — ABNORMAL HIGH (ref 70–99)
Glucose, Bld: 123 mg/dL — ABNORMAL HIGH (ref 70–99)
HCT: 31 % — ABNORMAL LOW (ref 39.0–52.0)
HCT: 32 % — ABNORMAL LOW (ref 39.0–52.0)
Hemoglobin: 10.5 g/dL — ABNORMAL LOW (ref 13.0–17.0)
Hemoglobin: 10.9 g/dL — ABNORMAL LOW (ref 13.0–17.0)
Potassium: 4 mEq/L (ref 3.5–5.1)
Potassium: 4.2 mEq/L (ref 3.5–5.1)
Sodium: 140 mEq/L (ref 135–145)
Sodium: 141 mEq/L (ref 135–145)

## 2010-12-01 LAB — CULTURE, BLOOD (ROUTINE X 2)
Culture: NO GROWTH
Culture: NO GROWTH

## 2010-12-01 LAB — MAGNESIUM: Magnesium: 1.9 mg/dL (ref 1.5–2.5)

## 2010-12-01 LAB — CULTURE, ROUTINE-ABSCESS

## 2010-12-01 LAB — APTT: aPTT: 33 seconds (ref 24–37)

## 2010-12-01 LAB — PROTIME-INR
INR: 1.29 (ref 0.00–1.49)
Prothrombin Time: 16.3 seconds — ABNORMAL HIGH (ref 11.6–15.2)

## 2010-12-01 LAB — MRSA PCR SCREENING: MRSA by PCR: NEGATIVE

## 2010-12-01 LAB — HEMOGLOBIN A1C
Hgb A1c MFr Bld: 6 % — ABNORMAL HIGH (ref <5.7)
Mean Plasma Glucose: 126 mg/dL — ABNORMAL HIGH (ref <117)

## 2010-12-11 LAB — CBC
HCT: 39.2 % (ref 39.0–52.0)
Hemoglobin: 12.8 g/dL — ABNORMAL LOW (ref 13.0–17.0)
MCHC: 32.7 g/dL (ref 30.0–36.0)
MCV: 81 fL (ref 78.0–100.0)
Platelets: 419 10*3/uL — ABNORMAL HIGH (ref 150–400)
RBC: 4.83 MIL/uL (ref 4.22–5.81)
RDW: 16.1 % — ABNORMAL HIGH (ref 11.5–15.5)
WBC: 14.6 10*3/uL — ABNORMAL HIGH (ref 4.0–10.5)

## 2010-12-11 LAB — CULTURE, ROUTINE-ABSCESS

## 2010-12-11 LAB — PROTIME-INR
INR: 1.15 (ref 0.00–1.49)
Prothrombin Time: 14.6 seconds (ref 11.6–15.2)

## 2010-12-11 LAB — APTT: aPTT: 32 seconds (ref 24–37)

## 2010-12-21 LAB — HEPATIC FUNCTION PANEL
ALT: 106 U/L — ABNORMAL HIGH (ref 0–53)
ALT: 106 U/L — ABNORMAL HIGH (ref 0–53)
ALT: 123 U/L — ABNORMAL HIGH (ref 0–53)
ALT: 133 U/L — ABNORMAL HIGH (ref 0–53)
AST: 110 U/L — ABNORMAL HIGH (ref 0–37)
AST: 60 U/L — ABNORMAL HIGH (ref 0–37)
AST: 63 U/L — ABNORMAL HIGH (ref 0–37)
AST: 69 U/L — ABNORMAL HIGH (ref 0–37)
Albumin: 1.9 g/dL — ABNORMAL LOW (ref 3.5–5.2)
Albumin: 2 g/dL — ABNORMAL LOW (ref 3.5–5.2)
Albumin: 2 g/dL — ABNORMAL LOW (ref 3.5–5.2)
Albumin: 2 g/dL — ABNORMAL LOW (ref 3.5–5.2)
Alkaline Phosphatase: 209 U/L — ABNORMAL HIGH (ref 39–117)
Alkaline Phosphatase: 241 U/L — ABNORMAL HIGH (ref 39–117)
Alkaline Phosphatase: 253 U/L — ABNORMAL HIGH (ref 39–117)
Alkaline Phosphatase: 254 U/L — ABNORMAL HIGH (ref 39–117)
Bilirubin, Direct: 0.4 mg/dL — ABNORMAL HIGH (ref 0.0–0.3)
Bilirubin, Direct: 0.5 mg/dL — ABNORMAL HIGH (ref 0.0–0.3)
Bilirubin, Direct: 0.6 mg/dL — ABNORMAL HIGH (ref 0.0–0.3)
Bilirubin, Direct: 0.6 mg/dL — ABNORMAL HIGH (ref 0.0–0.3)
Indirect Bilirubin: 0.5 mg/dL (ref 0.3–0.9)
Indirect Bilirubin: 0.5 mg/dL (ref 0.3–0.9)
Indirect Bilirubin: 0.7 mg/dL (ref 0.3–0.9)
Indirect Bilirubin: 0.7 mg/dL (ref 0.3–0.9)
Total Bilirubin: 0.9 mg/dL (ref 0.3–1.2)
Total Bilirubin: 1 mg/dL (ref 0.3–1.2)
Total Bilirubin: 1.3 mg/dL — ABNORMAL HIGH (ref 0.3–1.2)
Total Bilirubin: 1.3 mg/dL — ABNORMAL HIGH (ref 0.3–1.2)
Total Protein: 5.8 g/dL — ABNORMAL LOW (ref 6.0–8.3)
Total Protein: 6 g/dL (ref 6.0–8.3)
Total Protein: 6.1 g/dL (ref 6.0–8.3)
Total Protein: 6.3 g/dL (ref 6.0–8.3)

## 2010-12-21 LAB — CBC
HCT: 31.8 % — ABNORMAL LOW (ref 39.0–52.0)
HCT: 32.2 % — ABNORMAL LOW (ref 39.0–52.0)
HCT: 32.3 % — ABNORMAL LOW (ref 39.0–52.0)
HCT: 33.3 % — ABNORMAL LOW (ref 39.0–52.0)
HCT: 35 % — ABNORMAL LOW (ref 39.0–52.0)
HCT: 39.2 % (ref 39.0–52.0)
Hemoglobin: 10.7 g/dL — ABNORMAL LOW (ref 13.0–17.0)
Hemoglobin: 10.9 g/dL — ABNORMAL LOW (ref 13.0–17.0)
Hemoglobin: 10.9 g/dL — ABNORMAL LOW (ref 13.0–17.0)
Hemoglobin: 11.2 g/dL — ABNORMAL LOW (ref 13.0–17.0)
Hemoglobin: 11.7 g/dL — ABNORMAL LOW (ref 13.0–17.0)
Hemoglobin: 13.1 g/dL (ref 13.0–17.0)
MCHC: 33.4 g/dL (ref 30.0–36.0)
MCHC: 33.4 g/dL (ref 30.0–36.0)
MCHC: 33.6 g/dL (ref 30.0–36.0)
MCHC: 33.8 g/dL (ref 30.0–36.0)
MCHC: 33.8 g/dL (ref 30.0–36.0)
MCHC: 33.9 g/dL (ref 30.0–36.0)
MCV: 87.6 fL (ref 78.0–100.0)
MCV: 87.8 fL (ref 78.0–100.0)
MCV: 87.9 fL (ref 78.0–100.0)
MCV: 88.3 fL (ref 78.0–100.0)
MCV: 88.8 fL (ref 78.0–100.0)
MCV: 89 fL (ref 78.0–100.0)
Platelets: 292 10*3/uL (ref 150–400)
Platelets: 299 10*3/uL (ref 150–400)
Platelets: 302 10*3/uL (ref 150–400)
Platelets: 309 10*3/uL (ref 150–400)
Platelets: 333 10*3/uL (ref 150–400)
Platelets: 348 10*3/uL (ref 150–400)
RBC: 3.62 MIL/uL — ABNORMAL LOW (ref 4.22–5.81)
RBC: 3.63 MIL/uL — ABNORMAL LOW (ref 4.22–5.81)
RBC: 3.68 MIL/uL — ABNORMAL LOW (ref 4.22–5.81)
RBC: 3.79 MIL/uL — ABNORMAL LOW (ref 4.22–5.81)
RBC: 3.95 MIL/uL — ABNORMAL LOW (ref 4.22–5.81)
RBC: 4.43 MIL/uL (ref 4.22–5.81)
RDW: 13.2 % (ref 11.5–15.5)
RDW: 13.4 % (ref 11.5–15.5)
RDW: 13.5 % (ref 11.5–15.5)
RDW: 13.6 % (ref 11.5–15.5)
RDW: 13.8 % (ref 11.5–15.5)
RDW: 13.8 % (ref 11.5–15.5)
WBC: 10.1 10*3/uL (ref 4.0–10.5)
WBC: 10.7 10*3/uL — ABNORMAL HIGH (ref 4.0–10.5)
WBC: 12.6 10*3/uL — ABNORMAL HIGH (ref 4.0–10.5)
WBC: 8.6 10*3/uL (ref 4.0–10.5)
WBC: 9.1 10*3/uL (ref 4.0–10.5)
WBC: 9.4 10*3/uL (ref 4.0–10.5)

## 2010-12-21 LAB — BASIC METABOLIC PANEL
BUN: 11 mg/dL (ref 6–23)
BUN: 11 mg/dL (ref 6–23)
BUN: 5 mg/dL — ABNORMAL LOW (ref 6–23)
BUN: 7 mg/dL (ref 6–23)
CO2: 26 mEq/L (ref 19–32)
CO2: 28 mEq/L (ref 19–32)
CO2: 28 mEq/L (ref 19–32)
CO2: 29 mEq/L (ref 19–32)
Calcium: 8.3 mg/dL — ABNORMAL LOW (ref 8.4–10.5)
Calcium: 8.3 mg/dL — ABNORMAL LOW (ref 8.4–10.5)
Calcium: 8.5 mg/dL (ref 8.4–10.5)
Calcium: 8.6 mg/dL (ref 8.4–10.5)
Chloride: 102 mEq/L (ref 96–112)
Chloride: 103 mEq/L (ref 96–112)
Chloride: 104 mEq/L (ref 96–112)
Chloride: 105 mEq/L (ref 96–112)
Creatinine, Ser: 0.7 mg/dL (ref 0.4–1.5)
Creatinine, Ser: 0.86 mg/dL (ref 0.4–1.5)
Creatinine, Ser: 0.88 mg/dL (ref 0.4–1.5)
Creatinine, Ser: 0.88 mg/dL (ref 0.4–1.5)
GFR calc Af Amer: >60 mL/min (ref >60)
GFR calc Af Amer: >60 mL/min (ref >60)
GFR calc Af Amer: >60 mL/min (ref >60)
GFR calc Af Amer: >60 mL/min (ref >60)
GFR calc non Af Amer: >60 mL/min (ref >60)
GFR calc non Af Amer: >60 mL/min (ref >60)
GFR calc non Af Amer: >60 mL/min (ref >60)
GFR calc non Af Amer: >60 mL/min (ref >60)
Glucose, Bld: 113 mg/dL — ABNORMAL HIGH (ref 70–99)
Glucose, Bld: 114 mg/dL — ABNORMAL HIGH (ref 70–99)
Glucose, Bld: 204 mg/dL — ABNORMAL HIGH (ref 70–99)
Glucose, Bld: 90 mg/dL (ref 70–99)
Potassium: 3.9 mEq/L (ref 3.5–5.1)
Potassium: 4.1 mEq/L (ref 3.5–5.1)
Potassium: 4.2 mEq/L (ref 3.5–5.1)
Potassium: 4.3 mEq/L (ref 3.5–5.1)
Sodium: 135 mEq/L (ref 135–145)
Sodium: 136 mEq/L (ref 135–145)
Sodium: 136 mEq/L (ref 135–145)
Sodium: 138 mEq/L (ref 135–145)

## 2010-12-21 LAB — COMPREHENSIVE METABOLIC PANEL
ALT: 157 U/L — ABNORMAL HIGH (ref 0–53)
ALT: 255 U/L — ABNORMAL HIGH (ref 0–53)
ALT: 373 U/L — ABNORMAL HIGH (ref 0–53)
AST: 208 U/L — ABNORMAL HIGH (ref 0–37)
AST: 406 U/L — ABNORMAL HIGH (ref 0–37)
AST: 90 U/L — ABNORMAL HIGH (ref 0–37)
Albumin: 1.8 g/dL — ABNORMAL LOW (ref 3.5–5.2)
Albumin: 2.2 g/dL — ABNORMAL LOW (ref 3.5–5.2)
Albumin: 2.6 g/dL — ABNORMAL LOW (ref 3.5–5.2)
Alkaline Phosphatase: 187 U/L — ABNORMAL HIGH (ref 39–117)
Alkaline Phosphatase: 197 U/L — ABNORMAL HIGH (ref 39–117)
Alkaline Phosphatase: 241 U/L — ABNORMAL HIGH (ref 39–117)
BUN: 13 mg/dL (ref 6–23)
BUN: 5 mg/dL — ABNORMAL LOW (ref 6–23)
BUN: 9 mg/dL (ref 6–23)
CO2: 22 mEq/L (ref 19–32)
CO2: 23 mEq/L (ref 19–32)
CO2: 25 mEq/L (ref 19–32)
Calcium: 8.2 mg/dL — ABNORMAL LOW (ref 8.4–10.5)
Calcium: 8.2 mg/dL — ABNORMAL LOW (ref 8.4–10.5)
Calcium: 8.6 mg/dL (ref 8.4–10.5)
Chloride: 102 mEq/L (ref 96–112)
Chloride: 103 mEq/L (ref 96–112)
Chloride: 107 mEq/L (ref 96–112)
Creatinine, Ser: 0.83 mg/dL (ref 0.4–1.5)
Creatinine, Ser: 0.95 mg/dL (ref 0.4–1.5)
Creatinine, Ser: 0.96 mg/dL (ref 0.4–1.5)
GFR calc Af Amer: >60 mL/min (ref >60)
GFR calc Af Amer: >60 mL/min (ref >60)
GFR calc Af Amer: >60 mL/min (ref >60)
GFR calc non Af Amer: >60 mL/min (ref >60)
GFR calc non Af Amer: >60 mL/min (ref >60)
GFR calc non Af Amer: >60 mL/min (ref >60)
Glucose, Bld: 112 mg/dL — ABNORMAL HIGH (ref 70–99)
Glucose, Bld: 129 mg/dL — ABNORMAL HIGH (ref 70–99)
Glucose, Bld: 136 mg/dL — ABNORMAL HIGH (ref 70–99)
Potassium: 3.8 mEq/L (ref 3.5–5.1)
Potassium: 3.8 mEq/L (ref 3.5–5.1)
Potassium: 4.1 mEq/L (ref 3.5–5.1)
Sodium: 133 mEq/L — ABNORMAL LOW (ref 135–145)
Sodium: 134 mEq/L — ABNORMAL LOW (ref 135–145)
Sodium: 136 mEq/L (ref 135–145)
Total Bilirubin: 1.6 mg/dL — ABNORMAL HIGH (ref 0.3–1.2)
Total Bilirubin: 4.1 mg/dL — ABNORMAL HIGH (ref 0.3–1.2)
Total Bilirubin: 4.4 mg/dL — ABNORMAL HIGH (ref 0.3–1.2)
Total Protein: 5.9 g/dL — ABNORMAL LOW (ref 6.0–8.3)
Total Protein: 6.6 g/dL (ref 6.0–8.3)
Total Protein: 7.2 g/dL (ref 6.0–8.3)

## 2010-12-21 LAB — CROSSMATCH
ABO/RH(D): O POS
Antibody Screen: NEGATIVE

## 2010-12-21 LAB — URINALYSIS, ROUTINE W REFLEX MICROSCOPIC
Glucose, UA: NEGATIVE mg/dL
Hgb urine dipstick: NEGATIVE
Ketones, ur: 15 mg/dL — AB
Nitrite: POSITIVE — AB
Protein, ur: 30 mg/dL — AB
Specific Gravity, Urine: 1.027 (ref 1.005–1.030)
Urobilinogen, UA: 4 mg/dL — ABNORMAL HIGH (ref 0.0–1.0)
pH: 6 (ref 5.0–8.0)

## 2010-12-21 LAB — APTT
aPTT: 41 seconds — ABNORMAL HIGH (ref 24–37)
aPTT: 41 seconds — ABNORMAL HIGH (ref 24–37)

## 2010-12-21 LAB — CULTURE, BLOOD (ROUTINE X 2)
Culture: NO GROWTH
Culture: NO GROWTH

## 2010-12-21 LAB — DIFFERENTIAL
Basophils Absolute: 0 10*3/uL (ref 0.0–0.1)
Basophils Relative: 0 % (ref 0–1)
Eosinophils Absolute: 0 10*3/uL (ref 0.0–0.7)
Eosinophils Relative: 0 % (ref 0–5)
Lymphocytes Relative: 6 % — ABNORMAL LOW (ref 12–46)
Lymphs Abs: 0.8 10*3/uL (ref 0.7–4.0)
Monocytes Absolute: 1 10*3/uL (ref 0.1–1.0)
Monocytes Relative: 8 % (ref 3–12)
Neutro Abs: 10.8 10*3/uL — ABNORMAL HIGH (ref 1.7–7.7)
Neutrophils Relative %: 86 % — ABNORMAL HIGH (ref 43–77)

## 2010-12-21 LAB — URINE MICROSCOPIC-ADD ON

## 2010-12-21 LAB — PROTIME-INR
INR: 1.12 (ref 0.00–1.49)
INR: 1.19 (ref 0.00–1.49)
Prothrombin Time: 14.3 seconds (ref 11.6–15.2)
Prothrombin Time: 15 seconds (ref 11.6–15.2)

## 2010-12-21 LAB — ABO/RH: ABO/RH(D): O POS

## 2010-12-21 LAB — LIPASE, BLOOD: Lipase: 22 U/L (ref 11–59)

## 2010-12-22 LAB — PHOSPHORUS: Phosphorus: 3 mg/dL (ref 2.3–4.6)

## 2010-12-22 LAB — BASIC METABOLIC PANEL
BUN: 7 mg/dL (ref 6–23)
CO2: 24 mEq/L (ref 19–32)
Calcium: 8.6 mg/dL (ref 8.4–10.5)
Chloride: 103 mEq/L (ref 96–112)
Creatinine, Ser: 0.89 mg/dL (ref 0.4–1.5)
GFR calc Af Amer: >60 mL/min (ref >60)
GFR calc non Af Amer: >60 mL/min (ref >60)
Glucose, Bld: 158 mg/dL — ABNORMAL HIGH (ref 70–99)
Potassium: 4.3 mEq/L (ref 3.5–5.1)
Sodium: 136 mEq/L (ref 135–145)

## 2010-12-22 LAB — HEPATIC FUNCTION PANEL
ALT: 34 U/L (ref 0–53)
AST: 36 U/L (ref 0–37)
Albumin: 2.3 g/dL — ABNORMAL LOW (ref 3.5–5.2)
Alkaline Phosphatase: 58 U/L (ref 39–117)
Bilirubin, Direct: 0.2 mg/dL (ref 0.0–0.3)
Indirect Bilirubin: 0.7 mg/dL (ref 0.3–0.9)
Total Bilirubin: 0.9 mg/dL (ref 0.3–1.2)
Total Protein: 6 g/dL (ref 6.0–8.3)

## 2010-12-22 LAB — HEMOGLOBIN AND HEMATOCRIT, BLOOD
HCT: 36.1 % — ABNORMAL LOW (ref 39.0–52.0)
Hemoglobin: 12.3 g/dL — ABNORMAL LOW (ref 13.0–17.0)

## 2010-12-22 LAB — MAGNESIUM: Magnesium: 1.9 mg/dL (ref 1.5–2.5)

## 2010-12-23 LAB — CBC
HCT: 45.4 % (ref 39.0–52.0)
HCT: 38.5 % — ABNORMAL LOW (ref 39.0–52.0)
Hemoglobin: 15.7 g/dL (ref 13.0–17.0)
Hemoglobin: 13.3 g/dL (ref 13.0–17.0)
MCHC: 34.7 g/dL (ref 30.0–36.0)
MCHC: 34.5 g/dL (ref 30.0–36.0)
MCV: 92.3 fL (ref 78.0–100.0)
MCV: 92.6 fL (ref 78.0–100.0)
Platelets: 166 10*3/uL (ref 150–400)
Platelets: 170 10*3/uL (ref 150–400)
RBC: 4.92 MIL/uL (ref 4.22–5.81)
RBC: 4.16 MIL/uL — ABNORMAL LOW (ref 4.22–5.81)
RDW: 12.8 % (ref 11.5–15.5)
RDW: 12.8 % (ref 11.5–15.5)
WBC: 13.7 10*3/uL — ABNORMAL HIGH (ref 4.0–10.5)
WBC: 8.5 10*3/uL (ref 4.0–10.5)

## 2010-12-23 LAB — COMPREHENSIVE METABOLIC PANEL
ALT: 22 U/L (ref 0–53)
ALT: 17 U/L (ref 0–53)
AST: 24 U/L (ref 0–37)
AST: 16 U/L (ref 0–37)
Albumin: 3.6 g/dL (ref 3.5–5.2)
Albumin: 2.8 g/dL — ABNORMAL LOW (ref 3.5–5.2)
Alkaline Phosphatase: 60 U/L (ref 39–117)
Alkaline Phosphatase: 60 U/L (ref 39–117)
BUN: 13 mg/dL (ref 6–23)
BUN: 9 mg/dL (ref 6–23)
CO2: 23 mEq/L (ref 19–32)
CO2: 27 mEq/L (ref 19–32)
Calcium: 9.1 mg/dL (ref 8.4–10.5)
Calcium: 8.8 mg/dL (ref 8.4–10.5)
Chloride: 103 mEq/L (ref 96–112)
Chloride: 102 mEq/L (ref 96–112)
Creatinine, Ser: 0.86 mg/dL (ref 0.4–1.5)
Creatinine, Ser: 0.93 mg/dL (ref 0.4–1.5)
GFR calc Af Amer: >60 mL/min (ref >60)
GFR calc Af Amer: >60 mL/min (ref >60)
GFR calc non Af Amer: >60 mL/min (ref >60)
GFR calc non Af Amer: >60 mL/min (ref >60)
Glucose, Bld: 133 mg/dL — ABNORMAL HIGH (ref 70–99)
Glucose, Bld: 150 mg/dL — ABNORMAL HIGH (ref 70–99)
Potassium: 3.7 mEq/L (ref 3.5–5.1)
Potassium: 3.9 mEq/L (ref 3.5–5.1)
Sodium: 131 mEq/L — ABNORMAL LOW (ref 135–145)
Sodium: 136 mEq/L (ref 135–145)
Total Bilirubin: 1.4 mg/dL — ABNORMAL HIGH (ref 0.3–1.2)
Total Bilirubin: 1.3 mg/dL — ABNORMAL HIGH (ref 0.3–1.2)
Total Protein: 6.9 g/dL (ref 6.0–8.3)
Total Protein: 6.4 g/dL (ref 6.0–8.3)

## 2010-12-23 LAB — DIFFERENTIAL
Basophils Absolute: 0 10*3/uL (ref 0.0–0.1)
Basophils Relative: 0 % (ref 0–1)
Eosinophils Absolute: 0 10*3/uL (ref 0.0–0.7)
Eosinophils Relative: 0 % (ref 0–5)
Lymphocytes Relative: 7 % — ABNORMAL LOW (ref 12–46)
Lymphs Abs: 0.9 10*3/uL (ref 0.7–4.0)
Monocytes Absolute: 0.9 10*3/uL (ref 0.1–1.0)
Monocytes Relative: 7 % (ref 3–12)
Neutro Abs: 11.9 10*3/uL — ABNORMAL HIGH (ref 1.7–7.7)
Neutrophils Relative %: 87 % — ABNORMAL HIGH (ref 43–77)

## 2010-12-23 LAB — ABO/RH: ABO/RH(D): O POS

## 2010-12-23 LAB — TYPE AND SCREEN
ABO/RH(D): O POS
Antibody Screen: NEGATIVE

## 2010-12-23 LAB — LIPASE, BLOOD: Lipase: 10 U/L — ABNORMAL LOW (ref 11–59)

## 2011-01-30 NOTE — H&P (Signed)
NAME:  DEAKIN, LACEK NO.:  [id number]   MEDICAL RECORD NO.:  [id number]          PATIENT TYPE:  INP   LOCATION:  5120                         FACILITY:  [facility]   PHYSICIAN:  Lennie Muckle, MD      DATE OF BIRTH:  [birth year]   DATE OF ADMISSION:  05/16/2009  DATE OF DISCHARGE:                              HISTORY & PHYSICAL   ADMISSION DIAGNOSIS:  Cholecystitis.   Mr. Ashworth is a pleasant 70 year old male who apparently had some mild  onset of abdominal pain on Thursday.  He had a vague pain with some mild  nausea.  Apparently, he had taken some aspirin and felt somewhat better.  The pain abruptly will come up from sleep approximately 3:00 a.m. on  Saturday.  It was unrelenting.  He was noted to have fevers and chills.  He went to the emergency department for further evaluation.  In the  emergency department, he was noted to have a white count of 13.7,  hemoglobin/hematocrit 15 and 45.  Liver enzymes were within normal  limits.  He had a mild elevation of bilirubin of 1.4.  Since he was on  Plavix, he was instructed that he should have close followup.  He did  have Percocet prescribed.  He had continuing chills over the weekend,  persistent pain despite Percocet.  He comes in the office today for  further evaluation.  He did have a low-grade fever in the office of  100.2.  In discussion with me, he had a previous abdominal pain a couple  of weeks ago.  It was mild in nature and was self resolved.  He had  attempted to take antacid and tramadol without relief of this most  recent episode.  His ultrasound on 27th did reveal several stones within  the gallbladder and thickened wall.  No pericholecystic fluid.  He did  have positive Murphy sign.  Apparently, Dr. Gerrit Friends was called but due to  the Plavix felt high risk for surgery for bleeding and therefore was  called the office for a visit.   PAST MEDICAL HISTORY:  Significant for coronary artery disease.  He had  a myocardial infarction in May 2008.  He had a mild stroke in May 2009  and has a patent foraminal ovale.   SURGICAL HISTORY:  Triple bypass in 1991 by Dr. Riley Kill and Dr. Randa Evens,  prostatectomy in 2002 by Dr. Wanda Plump.   MEDICATION:  Lipitor and Plavix.  His last dose of Plavix was Thursday.   No drug allergies.  He states he was taking 81 mg of aspirin prior to  Thursday.   FAMILY HISTORY:  Negative.   SOCIAL HISTORY:  He is married.  He has 3 children.  Tobacco or alcohol  use.   REVIEW OF SYSTEMS:  He had a bypass surgery in 91, had a myocardial  infarction but has no current complaints of chest pain or shortness of  breath.  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  GENITOURINARY:  No difficulty with urinating  and difficulty with starting  or stopping his stream or blood in his  urine.  NEUROLOGIC:  He had a stroke in 2009 with some mild tingling on  the left side of his face.  No weakness.   PHYSICAL EXAMINATION:  GENERAL:  He is a pleasant male, appears  uncomfortable.  VITAL SIGNS:  Temperature of 100.2, blood pressure 154/91, pulse 79, and  weight is 189.  HEENT:  Head is normocephalic.  Sclerae are clear.  Extraocular muscles  are intact.  Pupils equal and round.  NECK:  Supple.  No lymphadenopathy.  CHEST:  Clear to auscultation bilaterally.  CARDIOVASCULAR:  Regular rate and rhythm.  ABDOMEN: He has guarding, does have tenderness in the right upper  quadrant.  No masses or organomegaly.  SKIN:  No jaundice is seen.  MUSCULOSKELETAL:  No deformities or edema.  PSYCHOLOGIC:  Within normal limits.  NEUROLOGIC:  Cranial nerves II through XII are grossly intact.  I find  no focal deficits in upper and lower motor examination.   ASSESSMENT AND PLAN:  1. Acute cholecystitis.  2. Coronary artery disease.   1. In terms of his cholecystitis, I have talked with Dr. Gaynelle Adu      regarding the patient and due to his  persistent pain and chills, I      think he needs to be admitted for IV antibiotics.  He does require      a cholecystectomy.  I talked about the risk of the surgery      including but not limited to bleeding, infection, bile duct injury,      conversion to an open procedure, and possibility of an ERCP      postoperatively.  All questions were answered and he is given      reading material.  2. For cardiac disease, I have talked with Dr. Riley Kill today and Dr.      Riley Kill is requesting the patient to be on 81 mg of aspirin just to      ensure patency of his stents.  I think this was a reasonable risk      and therefore, we will continue to keep him or start aspirin on      admission.  We will hold on his Plavix for now.      Lennie Muckle, MD  Electronically Signed     ALA/MEDQ  D:  05/16/2009  T:  05/17/2009  Job:  [id number]   cc:   Holley Bouche, M.D.

## 2011-01-30 NOTE — Assessment & Plan Note (Signed)
[facility]                            CARDIOLOGY OFFICE NOTE   NAME:Cabacungan, KATHLEEN LIKINS                   MRN:          [id number]  DATE:02/18/2008                            DOB:          1941/03/22    Mr. Lottman came in today for his routine office visit.  On Monday he  noticed some left facial numbness as well as some numbness in the hand  and arm.  It was mainly a sensation difference.  There was not any motor  weakness that we can tell.  He has not had fever, cough for other major  symptoms.  The patient has known coronary artery disease with prior  coronary bypass graft surgery and subsequent percutaneous saphenous vein  graft intervention.  He has also had an echocardiogram that demonstrates  preserved overall LV function with EF of 50-55%.  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  The CT that was done demonstrated an aortic root a 42  x 40 just above the left coronary sinus.  The descending thoracic aortic  area was normal as well.  There was mild dilatation of the celiac trunk  and calcification of the ostium of the SMA and celiac.  We had  recommended a redo at 1 year.  He has not had any progression of  symptoms since that time.   MEDICATIONS:  1. Aspirin 81 mg daily.  2. Essential oils one daily.  3. Calcium 500 mg 2 tablets daily.  4. Vitamin B.  5. Vitamin C.  6. Magnesium.  7. Multivitamin.  8. Vitamin E.  9. CoQ-10.  10.Garlic.  11.Lipitor 40 mg nightly.   Today on examination he is alert and oriented, in no distress.  The blood pressure is 116/80, the pulse is 64.  I do not appreciate significant carotid bruits.  The lung fields are clear.  The cardiac rhythm is regular without a significant murmur.  Gross neurologic exam reveals slight decrease in sensation in the left  face and hand, but this is not all that prominent.  He does not have any  asymmetry  of motor function.  His vision appears to be intact.   His electrocardiogram demonstrates normal sinus rhythm within normal  limits.   IMPRESSION:  The patient may have had a mini-stroke.  I have spoken with  Dr. Lesia Sago, who will see him at 3:45 today.  I have instructed the  patient to increase his aspirin to 325 mg a day.  Whether he needs  further evaluation such as a transesophageal echo, MRI and carotids,  we will wait until he has been seen by Dr. Anne Hahn.  Otherwise, we will  see him in cardiology in 6 months.     [name]. Riley Kill, MD, [facility]  Electronically Signed    TDS/MedQ  DD: 02/18/2008  DT: 02/18/2008  Job #: [id number]   cc:   Holley Bouche, M.D.  Marlan Palau, M.D.

## 2011-01-30 NOTE — Assessment & Plan Note (Signed)
 [facility]                            CARDIOLOGY OFFICE NOTE   NAME:Adames, JESHURUN                     MRN:          [id number]  DATE:11/18/2008                            DOB:          [birth year]    Jason Noble is in for a followup visit.  He really has no complaints.  He  has been seen by neurologists and had a thorough discussion regarding  options.  He has a moderate size PFO demonstrated in August.  Since that  time, he has been on dual antiplatelet therapy, and he did talk about  randomization in the trial.  He has been virtually without symptoms.  He  has had a number of basal cell cancers taken off, specifically 8-10.  He  has also struggled with some impotence, and sciatica.   MEDICATIONS:  1. Oils.  2. Calcium 500 two daily.  3. Vitamin B.  4. Vitamin C.  5. Magnesium daily.  6. Multivitamin daily.  7. Vitamin E 1 daily.  8. Coenzyme Q10 100 mg 2 daily.  9. Garlic 100 mg daily.  10.Lipitor 40 nightly.  11.Plavix 75 mg daily.   PHYSICAL EXAMINATION:  VITAL SIGNS:  The blood pressure is 148/84, the  pulse is 64.  LUNGS:  Fields are clear.  CARDIAC:  Remarkable for a non-displaced PMI.  Normal first and second  heart sounds without murmurs, rubs or gallops.  CHEST:  There is a median sternotomy.  EXTREMITIES:  There is no significant extremity edema.   The electrocardiogram demonstrates normal sinus rhythm essentially  within normal limits.   IMPRESSION:  1. Coronary artery disease status post coronary artery bypass graft      surgery.  2. Hypercholesterolemia, on lipid-lowering therapy.  3. Subsequent myocardial infarction treated with stenting.  4. Occult patent foramen ovale.  The patient has remained on medical      therapy without symptoms and I discussed briefly with him the      possibility of going to the team in [place]      for discussion.  He is at risk for cerebrovascular events      independent of a patent foramen  ovale and I have encouraged him to      remain on antiplatelet therapy.      [name]. Riley Kill, MD, [facility]  Electronically Signed    TDS/MedQ  DD: 02/09/2009  DT: 02/09/2009  Job #: [id number]

## 2011-01-30 NOTE — Assessment & Plan Note (Signed)
[facility]                            CARDIOLOGY OFFICE NOTE   NAME:Jason Noble, Jason Noble                   MRN:          [id number]  DATE:09/04/2007                            DOB:          September 23, 1940    Jason Noble is in for followup.  Clinically he is doing reasonably well.  He  had a myocardial infarction.  He only gets very mild discomfort when he  exerts himself.  He had a 2.75 x 12 Mini Vision stent placed into a  large clot burden in a diagonal graft, but is getting along well since  then.  He would like to stop his Plavix, because he gets bruises on his  arms, a little bit of a cough, and also some nosebleeds.  Importantly, a  CT scan was done.  This was done because of a slightly enlarged aortic  root.  It was minimally dilated.  There was mild fusiform dilatation of  the celiac trunk and __________  vascular calcifications of the SMA and  celiac, he denies chest pain.   MEDICATIONS:  1. Plavix 75 mg daily.  2. Aspirin 81 mg daily.  3. Essential Oils one daily.  4. Calcium.  5. Vitamin B.  6. Vitamin C.  7. Magnesium.  8. Multivitamin.  9. Vitamin E.  10.CoQ 10.  11.Zinc.  12.Garlic daily.  13.He also takes Crestor 20 mg at bedtime.  His LDL was 68.   PHYSICAL:  He is alert and oriented.  blood pressure is 136/90, pulse  60.  LUNG FIELDS:  Clear.  CARDIAC RHYTHM:  Regular, no carotid bruits.   EKG reveals normal sinus rhythm within normal limits done on November  25th.  He was not seen that day because of an emergency.   IMPRESSION:  1. Coronary artery disease status post coronary bypass graft surgery      in 1991 with subsequent vein graft occlusion.  2. Hypercholesterolemia on Crestor and garlic.  3. History of prostate cancer status post resection.  4. History of erectile dysfunction.   PLAN:  1. Return to clinic in 4 months.  2. Continue current medical regimen.  3. A long discussion today about risks and benefits of  Plavix.  He is      out beyond a month following his treatment.  We talked about the      benefits potentially of Plavix in patients with old bypass grafts,      and also with known advanced disease.  However, we will continue      current medical regimen and he will stop his Plavix if he so      chooses.  This would be a reasonable thing to do at this point.     [name]. Riley Kill, MD, [facility]  Electronically Signed    TDS/MedQ  DD: 09/04/2007  DT: 09/05/2007  Job #: [id number]

## 2011-01-30 NOTE — Op Note (Signed)
NAME:  Jason Noble, Jason Noble NO.:  [id number]   MEDICAL RECORD NO.:  [id number]          PATIENT TYPE:  INP   LOCATION:  5120                         FACILITY:  [facility]   PHYSICIAN:  Gaynelle Adu, MD        DATE OF BIRTH:  November 03, 1940   DATE OF PROCEDURE:  05/17/2009  DATE OF DISCHARGE:                               OPERATIVE REPORT   ATTENDING SURGEON:  Mary Sella. Andrey Campanile, MD   ASSISTANT SURGEON:  Dr. Carolynne Edouard.   PREOPERATIVE DIAGNOSIS:  Acute cholecystitis.   POSTOPERATIVE DIAGNOSIS:  Acute cholecystitis.   PROCEDURE:  Laparoscopic cholecystectomy.   ANESTHESIA:  General.   SPECIMEN:  Gallbladder which was sent to pathology.   ESTIMATED BLOOD LOSS:  50 mL.   DRAINS:  A 19-French Jackson-Pratt.   FINDINGS:  An acutely inflamed distended gallbladder which needed to be  aspirated.  The gallbladder was filled with numerous small stones.  The  cystic duct was very thickened.  There was no signs of stones within the  duct.  The two clips that have been placed across the duct would not  stay, therefore, two Endoloops were placed to secure the stump of the  cystic duct.  A 19-French Blake drain was left and this was a very  difficult dissection and thus a 22 modifier is needed and applied to  this case.  An additional 30 minutes was needed to dissect & mobilize  the gallbladder.   INDICATIONS FOR PROCEDURE:  The patient is a 70 year old gentleman who  developed abdominal pain on Thursday with nausea.  The pain worsened and  he awoke from his sleep.  He went to the emergency room and was found on  ultrasound to have gallstones.  His white blood cell count was normal at  that time and he was discharged home with followup with our group.  He  has been on Plavix and therefore he was instructed to hold his Plavix in  anticipation of a gallbladder surgery this week.  His pain worsened and  he was unable to keep any liquids down.  He also complained of  subjective fevers and  chills prompting him to come to our urgent office  and he was subsequently admitted to the hospital last night for  preparation for a lap cholecystectomy this morning.  The risk and  benefits of the procedure were explained in detail to the patient along  with his wife and son.  The risks and benefits including bleeding,  infection, injury to surrounding structures, bile duct, cystic duct  stump leak, need to convert to open, abscess formation, incisional  hernia, bleeding, wound infection, perioperative complications such as  DVT, PE, myocardial infarction, stroke and death were also discussed.  He understood and wished to proceed to surgery.   DESCRIPTION OF PROCEDURE:  After obtaining informed consent, the patient  was brought to the operating room and placed supine on the operating  room table and general endotracheal anesthesia was established.  A  surgical time-out was performed.  His abdomen was prepped and draped in  the usual standard surgical fashion.  He had received  a dose of  antibiotics of Unasyn within the past hour and half.  After the time-out  was completed, the abdomen was prepped and draped.   A vertical infraumbilical incision was made with a #15 blade after the  skin had been infiltrated with 0.25% Marcaine with epi.  The  subcutaneous tissue was spread with [employer].  The fascia was grasped  with Kochers x2.  The fascia was incised with a #15 blade.  The  posterior peritoneum was grasped and incised, as well.  A figure-of-  eight suture was placed using a 0 Vicryl on a UR-6 needle.  The Hasson  trocar was placed and pneumoperitoneum was established.  Laparoscope was  advanced into the abdominal cavity and the abdominal cavity was  surveilled after pneumoperitoneum with 15 mmHg had been established.  The patient was placed in the reversed Trendelenburg position and  rotated slightly to the left.   The gallbladder was very distended and tense.  We placed our  additional  trocars under direct visualization after the skin had been infiltrated  with local.  We placed 12 mm trocar in the epigastric and two 5 mm  trocars in the right hypochondrium.  There was omentum which was adhered  to the gallbladder and  this was carefully peeled away.  There was a  fair amount of inflammation around the gallbladder and the gallbladder  wall itself was very thickened.  It is very distended and difficult to  grasp, therefore, it was aspirated and suctioned out.  This enabled [place]  to grasp the gallbladder somewhat easier.  Then using the suction  irrigator device, blunt dissection was used to gently retract the  peritoneum and the colon and the duodenum and the stomach away from the  infundibulum of the gallbladder with very careful dissection.  This  dissection required an additional 30 minutes.  We also used the suction  irrigator device to help with our dissection.  Then using the hook  electrocautery, we came along the medial and lateral aspects of the  gallbladder peritoneum to incise it.  We were eventually able to come  down to the neck of the gallbladder.  A grasper was placed on the  gallbladder and it was retracted laterally.  Another grasper was used to  retract the gallbladder toward the patient's right shoulder.  We were  able to dissect near the triangle of Calot.  This was done with a blunt  dissection using the suction irrigator mainly.  We identified two ductal  structures entering the gallbladder.  One appeared to be the cystic duct  and one appeared to be the cystic artery.  We were able to  circumferentially dissect around these after approximately 30 minutes of  tedious dissection.  These were the only two structures entering the  gallbladder and we were able to achieve the critical view.  We saw the  liver posteriorly.  Two clips were placed on the downside of the cystic  duct and one on the upside. The cystic duct was very thickened and   enlarged and prior to placing the clips there was no evidence of stones  within the duct.  A clip had been placed on the upside next to the  gallbladder and endoshears were used to transect the cystic duct.  One  of the clips had fallen off the cystic duct stump.  At that point, we  made plans to place an Endoloop and the Endoloop was brought into the  field and  when we reinspected the cystic duct stump, the second clip had  fallen off the cystic duct stump as well.  A [place] was used to grasp  the cystic duct stump and pull it up.  An Endoloop was placed at the  base of the cystic duct stump and it was secured.  Another Endoloop was  also placed in a similar fashion.  The cystic artery was  circumferentially dissected down and two clips were placed on the  downside and one on the upside.  It was then transected with  Endo  Shears.   We then began mobilizing the gallbladder and the gallbladder fossa using  hook electrocautery.  It was very difficult to get in a correct plane.  A portion of the liver capsule was violated and the electrocautery was  used to obtain hemostasis.  At this point as we were retracting on the  gallbladder, a rent was made in the gallbladder and there was free  spillage of small numerous stones.  These were evacuated as quickly as  possible with a large suction irrigator catheter.  We then proceeded  with mobilizing the gallbladder from the gallbladder fossa.  The  posterior wall of the gallbladder and the liver bed were intimately  fused and it was very difficult to get into the correct plane.  We  eventually were able to get somewhat in the correct plane laterally and  medially and mobilized the gallbladder that way however, the posterior  wall of the gallbladder was intimately attached to the liver capsule and  we elected to leave a portion of it behind.  We eventually freed the  gallbladder and an EndoCatch bag was placed through the epigastric  trocar and  the gallbladder was placed in the bag.  It was then placed in  the right upper quadrant.  We then irrigated the right upper quadrant  with a total of 5 liters of saline in order to retrieve all the numerous  small stones that had spilled when mobilizing the gallbladder.  It  appeared that we had gotten all the stones out that we could see.  There  is no evidence of bile leak from the liver bed or cystic duct stump.  We  reinspected the liver capsule and the gallbladder fossa.  We cauterized  the posterior wall of the gallbladder that was left adhered to the  liver.  We also cauterized the edge of the liver where there was a small  amount of oozing.  We reinspected our Endoloop sutures and there was  snug across the cystic duct stump.  Hemostasis have been achieved.  The  irrigation solution all have been evacuated.  We then placed a camera in  the epigastric trocar and placed a mother-in-law grasper through the  umbilical trocar and grasped the endobag and removed the gallbladder.  The gallbladder was still very difficult to remove from the umbilicus.  We actually had to incise our fascial incision in order to retrieve the  gallbladder and the bag out of the abdomen.  Once the gallbladder was  passed off the field,  we then reapproximated the fascia using two  figure-of-eight sutures of 0 Vicryl on the UR-6 needle.  Pneumoperitoneum was reestablished and we surveilled our umbilical  closure.  There was no signs of any omentum or intestine within our  closure.  There was no air leakage from the umbilicus.  We then placed a  19-French drain in the right upper quadrant, it was underneath  the liver  and the cystic duct stump and then the right pericolic gutter was then  brought up through the most lateral right-sided trocar and was secured  to the skin with a 2-0 nylon.  We then removed the trocars under direct  visualization.  There was no evidence of bleeding from remaining 5-mm  trocars  that was removed.  Pneumoperitoneum was released and laparoscope  was removed.  The epigastric trocar was then reapproximated with a  figure-of-eight 0 Vicryl on UR-6 needle.  The subcutaneous tissue was  thoroughly irrigated and  the skin incisions were closed with a 4-0  Monocryl in subcuticular fashion, followed by application of benzoin and  Steri-Strips and a bandage and a Tegaderm was placed at the umbilicus.  The patient was extubated and taken to the recovery room in stable  condition.  All counts were correct x2.  There were no immediate  complications.      Gaynelle Adu, MD  Electronically Signed     EW/MEDQ  D:  05/17/2009  T:  05/18/2009  Job:  [id number]

## 2011-01-30 NOTE — Assessment & Plan Note (Signed)
[facility]                            CARDIOLOGY OFFICE NOTE   NAME:Jason Noble                   MRN:          [id number]  DATE:04/09/2007                            DOB:          12-19-1940    [name] is in for followup. It has been about 4 years since I saw  him last.  He last underwent exercise tolerance testing in 2004.  The  patient underwent coronary artery bypass graft surgery in 1991. He was  recently playing golf at [place], and presented with a sudden onset of  chest pain.  He went to the [facility] at [phone]  [phone] and Dr. __________ did cardiac catheterization.  This demonstrated  continued patency to the internal mammary to a totally occluded LAD. He  had a saphenous vein graft to the obtuse marginal branch which remained  patent.  Other collaterals to the distal right coronary.  An 80% and  100% lesions to the RCA.  He underwent stenting to the diagonal graft,  and there was a large clot burden removed by a fetch catheter and a 2.75  x 12 minivision was deployed in this vessel.  Estimated ejection  fraction was 50% to 55%.  He did note that the aorta appeared to be  globally dilated and he would recommend a follow up in 6 months.  The  patient since discharge from the hospital has been  doing relatively  well.  He has not had recurrent ongoing chest pain.  He and I had a very  thorough discussion of the various parameters and including the need for  aspirin and Plavix, and what the findings include.   CURRENT MEDICATIONS:  Include:  1. Plavix 75 mg daily.  2. Vytorin 10/80 daily.  3. Aspirin 81 mg daily.  4. Essential oils  2 daily.  5. __________ 200 mg daily.  6. Calcium 500 mg 2 daily.  7. Vitamin D 50 one daily.  8. Vitamin C 2 daily.  9. Magnesium 250 mg daily.  10.Multivitamin daily.  11.Vitamin E daily.  12.CoQ10 100 mg daily.  13.Zinc daily and garlic.   PHYSICAL EXAMINATION:  He is  alert and oriented.  No acute distress.  Blood pressure 124/80 in the right arm and 120/81 in the left arm.  The  pulse is 56.  LUNGS:  Fields are clear to auscultation and percussion.  The media  sternotomy is well healed.  CARDIAC:  Rhythm is regular.   EKG done today reveals sinus bradycardia and is otherwise within normal  limits.   IMPRESSION:  1. Long standing coronary artery disease with prior coronary      revascularization surgery in 1991 with saphenous vein graft      occlusion and successful percutaneous stenting in May 2008.  2. Hypercholesterolemia.  Currently on Vytorin and garlic.  3. History of prostate cancer, status post resection.  4. History of erectile dysfunction.   PLAN:  1. We will do a followup exercise tolerance test to help with exercise      prescription.  Give the patient some ideas as to  what he is capable      of doing.  2. We discussed the timing and duration of aspirin and Plavix therapy.      We talked about the potential options and discontinuation of 1      year, continuation of therapy.  3. We talked briefly about seeing a urologist with regard to      discussions regarding mechanisms for treatment of erectile      dysfunction, particularly in light of the fact that drugs in the      past have not been working particularly well.     [name]. Jason Kill, MD, [facility]  Electronically Signed    TDS/MedQ  DD: 04/10/2007  DT: 04/11/2007  Job #: [id number]

## 2011-01-30 NOTE — Procedures (Signed)
[facility]                              EXERCISE TREADMILL   NAME:Jason Noble, Jason Noble                   MRN:          [id number]  DATE:05/12/2007                            DOB:          03/31/1941    Duration of exercise was 10 minutes, maximum heart rate 137, % of PMHR  is 87%, the patient exercised today on the Bruce protocol.  He  experienced no chest pain.  Test was terminated due to fatigue.  At  maximum stress, there was no definite ST depression.  There is minimal J-  point depression.  Followup tracings revealed no significant ST  depression.  Study was felt to be negative for inducible ischemia at a  very good work load.   The patient has had percutaneous stenting with a non-drug eluting  platform.  He asked about duration of Plavix.  I mentioned to him that  we would like to continue him on for at least a year and potentially  thereafter, based on ongoing discussions.  I did tell him that he only  needed this really for a stent.  He also wants to come off of Vytorin  because of all of the negative publicity and I have written him a  prescription for Crestor 20 mg daily.  I have asked him to followup with  Dr. Holley Bouche regarding this.  We will see him back in followup in  3 months.  The patient has also had an echocardiogram, and the  echocardiogram demonstrates upper normal aortic size so when I see him  back we will order a 78-month CT scan to evaluate aortic dimensions.     [name]. Riley Kill, MD, [facility]  Electronically Signed    TDS/MedQ  DD: 05/12/2007  DT: 05/12/2007  Job #: [id number]   cc:   Holley Bouche, M.D.

## 2011-02-02 NOTE — H&P (Signed)
[facility]  Patient:    Jason Noble, Jason Noble                   MRN: [id number] Adm. Date:  [id number] Attending:  Ellwood Handler CC:         Arvella Merles, M.D.   History and Physical  DATE OF BIRTH:  Jul 26, 1941  REFERRING PHYSICIAN:  Arvella Merles, M.D.  INDICATIONS:  A 70 year old white male with prostate cancer.  PAST MEDICAL HISTORY: 1. December 1996 PSA 7.5.  Biopsy by Dr. Vernie Ammons negative for CA. 2. February 1998 PSA 6.2 seen by Dr. Wanda Plump. 3. January 1999 PSA rose to 9.1.  Patient refused office visit with followup    ultrasound and biopsy. 4. Patient had followup with Dr. Ihor Gully and Dr. Georgette Shell from 1998    through 2002. 5. January 2002 PSA 12.1.  Biopsy with Dr. Lindley Magnus showed Gleason 3/4    adenocarcinoma involving less than 10% of the biopsies on the right side    and a Gleason 3+4 adenocarcinoma involving less than 10% of the biopsies    from the left side.  Patient had second opinion with Dr. Vernie Ammons in    [place], Dr. Lindley Magnus in [facility], Dr. Concepcion Living at [facility] Department of Urology, and Dr. Nedra Hai at [facility] Department of Radiation Oncology. 6. March 2002 saw Dr. Abbe Amsterdam.  December 27, 2000 pelvic CT negative for    metastatic disease.  December 23, 2000 bone scan negative for metastatic    disease . 7. Discussed with patient the options for therapy including watchful waiting,    hormonal therapy, radiation therapy either external or seed implant, and    surgical prostatectomy.  Have discussed with patient and family members the    risks and benefits of surgical prostatectomy including bleeding, infection,    injury to adjacent nerves, vascular structures and bowel, impotence,    incontinence, and bladder neck contracture.  Patient understands and agrees    to proceed.  He has chosen to autologously donate 2 units of packed red     cells.  MEDICATIONS:  Lipitor 20 mg p.o. q.d.  ALLERGIES:  Denies.  SOCIAL HISTORY:  Tobacco:  Quit in 1967.  ETOH:  Denies.  PAST MEDICAL HISTORY:  CABG in 1991, umbilical hernia repair in 1996.  PHYSICAL EXAMINATION  GENERAL:  Healthy, active, and alert 70 year old male.  VITAL SIGNS:  Weight 200 pounds, blood pressure 128/81, pulse 84.  NECK:  Negative adenopathy.  Negative bruit.  LUNGS:  Clear to P&A.  HEART:  Regular rate and rhythm without murmur or gallop.  ABDOMEN:  Positive bowel sounds, soft, nontender.  GENITOURINARY:  Bilaterally descended testes.  Normal prostate.  Penis without lesions.  NEUROLOGIC:  Grossly intact.  LABORATORIES:  Pending at the time of this dictation.  PLAN:  Will proceed with prostatectomy this a.m. DD:  01/14/01 TD:  01/14/01 Job: [id number]

## 2011-02-02 NOTE — Op Note (Signed)
[facility]  Patient:    Jason Noble, Jason Noble                   MRN: [id number] Proc. Date: 01/14/01 Adm. Date:  [id number] Attending:  Ellwood Handler CC:         Arvella Merles, M.D.   Operative Report  DATE OF BIRTH:  1941-01-19  REFERRING PHYSICIAN:  Arvella Merles, M.D.  PREOPERATIVE DIAGNOSIS:  Prostate cancer.  POSTOPERATIVE DIAGNOSIS:  Prostate cancer.  PROCEDURE:  Radical retropubic prostatectomy and pelvic lymph node dissection.  ANESTHESIA:  General.  DRAINS:  20 Jamaica Foley, 10 Jamaica Blake drain.  SPECIMENS:  Right and left pelvic lymph nodes, prostate with attached seminal vesicles.  COMPLICATIONS:  None.  DESCRIPTION OF PROCEDURE:  The patient was prepped and draped in the supine position after institution of an adequate level of general anesthesia. Midline infraumbilical incision was carried through the skin and muscular layers of the anterior abdominal wall.  The peritoneal cavity was retracted superiorly to reveal the retropubic space as well as the right and left obturator fossa.  Bookwalter retractor was positioned.  Node dissection carried out between the external iliac vein superiorly and the obturator nerve inferiorly.  Aggregate of soft fibrolymphatic tissue was removed.  No evidence of enlarged or indurated lymph nodes.  All specimens were sent for permanent section, and there was no evidence of vascular or nerve injury.  Endopelvic fascia was then incised lateral to the prostate.  Puboprostatic ligaments were taken down sharply.  Right angled clamp was then passed distal to the prostatic apex, anterior to the urethra.  Dorsal vein complex was oversewed with two ties of 2-0 Vicryl and then divided with the Bovie current. Urethra was easily visualized.  Dissection at the level of the apex freed the neurovascular bundle from the lateral urethra and the distal prostatic apex. McDougal clamp was then used  to gently go around the urethra and elevate it before dividing on a knife on a long handle.  Indwelling Foley catheter was then retracted superiorly.  Right and left prostatic pedicles were then taken down segmentally using a fine right angle clamp and interrupted 2-0 silk ties. The vas and seminal vesicles were then dissected free.  Estimate of prostate volume preoperatively about 90 grams.  The vas and seminal vesicles were likewise quite large.  Once pedicles had been taken down, Allis clamps were placed at the bladder neck.  Fine Vanderbilt forceps used to dissect the muscular fibers at the bladder neck as they entered the base of the prostate. Tapered bladder neck was created which would admit the tip of the ring finger. No need for racquet-handle sutures.  Vas with attached seminal vesicle and prostate was then delivered as a specimen.  Bleeding sites were lightly cauterized.  Mucosa at the bladder neck was then everted with interrupted sutures of 4-0 chromic.  Greenwald sound was then passed per urethra. Stitches of 2-0 Vicryl were then placed at the 12 oclock, 10 oclock, 7 oclock, 5 oclock, and 2 oclock position.  A 20 French silicone catheter was then brought through the urethra and passed into the bladder.  Then 20 cc in 30 cc balloon.  Stitches were then brought through a similar position in the bladder neck at the 12 oclock, 10 oclock, 7 oclock, 5 oclock, and 2 oclock position.  Anastomosis was then sewed down.  Foley catheter was left to straight drain.  A 10 Jamaica Blake drain was  then brought out through a stab incision in the left lower quadrant.  Fascia was reapproximated with #1 PDS.  Skin was closed with skin staples, and patient was returned to recovery in satisfactory condition. DD:  01/14/01 TD:  01/14/01 Job: [id number]

## 2011-02-02 NOTE — Procedures (Signed)
[facility]  Patient:    Jason Noble, Jason Noble                     MRN: [id number] Proc. Date: 01/24/00 Adm. Date:  [id number] Attending:  Orland Mustard CC:         Arvella Merles, M.D.                           Procedure Report  PROCEDURE:  Colonoscopy.  ENDOSCOPIST:  Llana Aliment. Edwards, M.D.  MEDICATIONS:  Fentanyl 75 mcg and Versed 7.5 mg IV.  INDICATION:  Nice 70 year old gentleman with a strong family history of colon cancer, who does have occasional rectal bleeding.  His mother had colon cancer.  SCOPE:  Adult Olympus video colonoscope.  DESCRIPTION OF PROCEDURE:  Procedure had been explained to patient and consent obtained.  With the patient in the left lateral decubitus position, the adult video Olympus colonoscope was inserted and advanced under direct visualization.  The prep was excellent.  Using abdominal pressure and position changes, we were able to advance down into the right lower quadrant.  The ileocecal valve was entered for approximately 1 cm and could not advance further and scope withdrawn.  The cecum, ascending colon, hepatic flexure, transverse colon, splenic flexure, descending and sigmoid colon were seen well.  Moderate diverticular disease in the sigmoid colon.  Scope was withdrawn down into the rectum; some internal hemorrhoids in the rectum.  The scope was withdrawn.  Patient tolerated the procedure well.  ASSESSMENT: 1. No polyps. 2. Diverticulosis. 3. Internal hemorrhoids, probably the source of his rectal bleeding.  PLAN:  Will recommend repeating in five years.  Will give information about hemorrhoids and diverticulosis and will recommend yearly Hemoccults. DD:  01/24/00 TD:  01/25/00 Job: [id number]

## 2011-02-02 NOTE — Discharge Summary (Signed)
[facility]  Patient:    Jason Noble, Jason Noble                   MRN: [id number] Adm. Date:  [id number] Disc. Date: [id number] Attending:  Ellwood Handler CC:         Verl Dicker, M.D.  Patients chart   Discharge Summary  DATE OF BIRTH:  01/13/1941.  HISTORY OF PRESENT ILLNESS:  Indications, medications, allergies, tobacco, ETOH, past medical history, social history, physical exam, and review of systems were outlined in the admitting note.  HOSPITAL COURSE:  The patient was admitted on January 14, 2001.  Underwent radical retropubic prostatectomy and pelvic lymph node dissection.  The patient was given two units of autologous blood during the procedure.  By postoperative day one, BUN 15, creatinine 1.1, hemoglobin 9.9, white count 7.8.  By postoperative day two, hemoglobin 9.6, white count 6.6.  JP output had decreased to 20 cc per shift.  Urine output had increased to 1500 cc per shift.  By postoperative day three, Jackson-Pratt and IV were discontinued.  Foley catheter was left to straight drain.  DISCHARGE MEDICATIONS:  The patient was discharged home with Macrobid, Vicodin, and Detrol LA with plans for office visit and staple removal in about 10 days.  Plans for cystogram and Foley catheter removal in about three weeks. The patient is to call us if he has any questions or problems.  DISCHARGE DIAGNOSIS:  Prostate cancer.  CONDITION ON DISCHARGE:  Fair. DD:  01/29/01 TD:  01/29/01 Job: [id number]

## 2011-03-23 ENCOUNTER — Other Ambulatory Visit: Payer: Self-pay | Admitting: Otolaryngology

## 2011-03-23 DIAGNOSIS — N888 Other specified noninflammatory disorders of cervix uteri: Secondary | ICD-10-CM

## 2011-03-27 ENCOUNTER — Ambulatory Visit
Admission: RE | Admit: 2011-03-27 | Discharge: 2011-03-27 | Disposition: A | Discharge status: Home / Self Care | Payer: Medicare Other | Source: Ambulatory Visit | Attending: Otolaryngology | Admitting: Otolaryngology

## 2011-03-27 DIAGNOSIS — N888 Other specified noninflammatory disorders of cervix uteri: Secondary | ICD-10-CM

## 2011-03-29 ENCOUNTER — Other Ambulatory Visit (HOSPITAL_COMMUNITY)
Admission: RE | Admit: 2011-03-29 | Discharge: 2011-03-29 | Disposition: A | Discharge status: Home / Self Care | Payer: Medicare Other | Source: Ambulatory Visit | Attending: Otolaryngology | Admitting: Otolaryngology

## 2011-03-29 ENCOUNTER — Other Ambulatory Visit: Payer: Self-pay | Admitting: Otolaryngology

## 2011-03-29 DIAGNOSIS — R221 Localized swelling, mass and lump, neck: Secondary | ICD-10-CM | POA: Insufficient documentation

## 2011-03-29 DIAGNOSIS — R220 Localized swelling, mass and lump, head: Secondary | ICD-10-CM | POA: Insufficient documentation

## 2011-04-04 ENCOUNTER — Other Ambulatory Visit (HOSPITAL_COMMUNITY): Payer: Self-pay | Admitting: Otolaryngology

## 2011-04-04 DIAGNOSIS — R221 Localized swelling, mass and lump, neck: Secondary | ICD-10-CM

## 2011-04-10 ENCOUNTER — Encounter (HOSPITAL_COMMUNITY)
Admission: RE | Admit: 2011-04-10 | Discharge: 2011-04-10 | Disposition: A | Discharge status: Home / Self Care | Payer: Medicare Other | Source: Ambulatory Visit | Attending: Otolaryngology | Admitting: Otolaryngology

## 2011-04-10 DIAGNOSIS — R220 Localized swelling, mass and lump, head: Secondary | ICD-10-CM | POA: Insufficient documentation

## 2011-04-10 DIAGNOSIS — R599 Enlarged lymph nodes, unspecified: Secondary | ICD-10-CM | POA: Insufficient documentation

## 2011-04-10 DIAGNOSIS — C01 Malignant neoplasm of base of tongue: Secondary | ICD-10-CM | POA: Insufficient documentation

## 2011-04-10 DIAGNOSIS — R221 Localized swelling, mass and lump, neck: Secondary | ICD-10-CM

## 2011-04-10 LAB — GLUCOSE, CAPILLARY: Glucose-Capillary: 97 mg/dL (ref 70–99)

## 2011-04-10 MED ORDER — FLUDEOXYGLUCOSE F - 18 (FDG) INJECTION
18.3000 | Freq: Once | INTRAVENOUS | Status: AC | PRN
  Administered 2011-04-10: 18.3 via INTRAVENOUS

## 2011-04-18 ENCOUNTER — Other Ambulatory Visit: Payer: Self-pay | Admitting: Otolaryngology

## 2011-04-18 ENCOUNTER — Ambulatory Visit (HOSPITAL_COMMUNITY): Payer: Medicare Other

## 2011-04-18 ENCOUNTER — Ambulatory Visit (HOSPITAL_COMMUNITY)
Admission: RE | Admit: 2011-04-18 | Discharge: 2011-04-18 | Disposition: A | Discharge status: Home / Self Care | Payer: Medicare Other | Source: Ambulatory Visit | Attending: Otolaryngology | Admitting: Otolaryngology

## 2011-04-18 DIAGNOSIS — Z8673 Personal history of transient ischemic attack (TIA), and cerebral infarction without residual deficits: Secondary | ICD-10-CM | POA: Insufficient documentation

## 2011-04-18 DIAGNOSIS — C01 Malignant neoplasm of base of tongue: Secondary | ICD-10-CM | POA: Insufficient documentation

## 2011-04-18 DIAGNOSIS — Z01818 Encounter for other preprocedural examination: Secondary | ICD-10-CM | POA: Insufficient documentation

## 2011-04-18 DIAGNOSIS — Z951 Presence of aortocoronary bypass graft: Secondary | ICD-10-CM | POA: Insufficient documentation

## 2011-04-18 DIAGNOSIS — I2510 Atherosclerotic heart disease of native coronary artery without angina pectoris: Secondary | ICD-10-CM | POA: Insufficient documentation

## 2011-04-18 DIAGNOSIS — Z01812 Encounter for preprocedural laboratory examination: Secondary | ICD-10-CM | POA: Insufficient documentation

## 2011-04-18 DIAGNOSIS — Z79899 Other long term (current) drug therapy: Secondary | ICD-10-CM | POA: Insufficient documentation

## 2011-04-18 DIAGNOSIS — I252 Old myocardial infarction: Secondary | ICD-10-CM | POA: Insufficient documentation

## 2011-04-18 LAB — BASIC METABOLIC PANEL
BUN: 18 mg/dL (ref 6–23)
CO2: 28 mEq/L (ref 19–32)
Calcium: 9.9 mg/dL (ref 8.4–10.5)
Chloride: 104 mEq/L (ref 96–112)
Creatinine, Ser: 1.03 mg/dL (ref 0.50–1.35)
GFR calc Af Amer: >60 mL/min (ref >60)
GFR calc non Af Amer: >60 mL/min (ref >60)
Glucose, Bld: 94 mg/dL (ref 70–99)
Potassium: 4.8 mEq/L (ref 3.5–5.1)
Sodium: 139 mEq/L (ref 135–145)

## 2011-04-18 LAB — CBC
HCT: 42.6 % (ref 39.0–52.0)
Hemoglobin: 14.8 g/dL (ref 13.0–17.0)
MCH: 31 pg (ref 26.0–34.0)
MCHC: 34.7 g/dL (ref 30.0–36.0)
MCV: 89.3 fL (ref 78.0–100.0)
Platelets: 174 10*3/uL (ref 150–400)
RBC: 4.77 MIL/uL (ref 4.22–5.81)
RDW: 12.9 % (ref 11.5–15.5)
WBC: 6.9 10*3/uL (ref 4.0–10.5)

## 2011-04-18 LAB — SURGICAL PCR SCREEN
MRSA, PCR: NEGATIVE
Staphylococcus aureus: NEGATIVE

## 2011-05-07 ENCOUNTER — Ambulatory Visit (HOSPITAL_COMMUNITY): Payer: Medicare Other | Admitting: Dentistry

## 2011-05-07 DIAGNOSIS — C01 Malignant neoplasm of base of tongue: Secondary | ICD-10-CM

## 2011-05-07 DIAGNOSIS — Z0189 Encounter for other specified special examinations: Secondary | ICD-10-CM

## 2011-05-09 ENCOUNTER — Other Ambulatory Visit: Payer: Self-pay | Admitting: Oncology

## 2011-05-09 ENCOUNTER — Encounter (HOSPITAL_BASED_OUTPATIENT_CLINIC_OR_DEPARTMENT_OTHER): Payer: Medicare Other | Admitting: Oncology

## 2011-05-09 ENCOUNTER — Ambulatory Visit
Admission: RE | Admit: 2011-05-09 | Discharge: 2011-05-09 | Disposition: A | Discharge status: Home / Self Care | Payer: Medicare Other | Source: Ambulatory Visit | Attending: Radiation Oncology | Admitting: Radiation Oncology

## 2011-05-09 DIAGNOSIS — F411 Generalized anxiety disorder: Secondary | ICD-10-CM | POA: Insufficient documentation

## 2011-05-09 DIAGNOSIS — R52 Pain, unspecified: Secondary | ICD-10-CM | POA: Insufficient documentation

## 2011-05-09 DIAGNOSIS — C109 Malignant neoplasm of oropharynx, unspecified: Secondary | ICD-10-CM | POA: Insufficient documentation

## 2011-05-09 DIAGNOSIS — R110 Nausea: Secondary | ICD-10-CM | POA: Insufficient documentation

## 2011-05-09 DIAGNOSIS — R070 Pain in throat: Secondary | ICD-10-CM | POA: Insufficient documentation

## 2011-05-09 DIAGNOSIS — C01 Malignant neoplasm of base of tongue: Secondary | ICD-10-CM

## 2011-05-09 DIAGNOSIS — K121 Other forms of stomatitis: Secondary | ICD-10-CM | POA: Insufficient documentation

## 2011-05-09 DIAGNOSIS — Z510 Encounter for antineoplastic radiation therapy: Secondary | ICD-10-CM | POA: Insufficient documentation

## 2011-05-09 LAB — COMPREHENSIVE METABOLIC PANEL
ALT: 15 U/L (ref 0–53)
AST: 19 U/L (ref 0–37)
Albumin: 4.3 g/dL (ref 3.5–5.2)
Alkaline Phosphatase: 64 U/L (ref 39–117)
BUN: 21 mg/dL (ref 6–23)
CO2: 26 mEq/L (ref 19–32)
Calcium: 9.5 mg/dL (ref 8.4–10.5)
Chloride: 104 mEq/L (ref 96–112)
Creatinine, Ser: 1.21 mg/dL (ref 0.50–1.35)
Glucose, Bld: 97 mg/dL (ref 70–99)
Potassium: 4.2 mEq/L (ref 3.5–5.3)
Sodium: 139 mEq/L (ref 135–145)
Total Bilirubin: 0.9 mg/dL (ref 0.3–1.2)
Total Protein: 7 g/dL (ref 6.0–8.3)

## 2011-05-09 LAB — CBC WITH DIFFERENTIAL/PLATELET
BASO%: 0.3 % (ref 0.0–2.0)
Basophils Absolute: 0 10*3/uL (ref 0.0–0.1)
EOS%: 8.7 % — ABNORMAL HIGH (ref 0.0–7.0)
Eosinophils Absolute: 0.5 10*3/uL (ref 0.0–0.5)
HCT: 42.8 % (ref 38.4–49.9)
HGB: 14.8 g/dL (ref 13.0–17.1)
LYMPH%: 33.7 % (ref 14.0–49.0)
MCH: 31.9 pg (ref 27.2–33.4)
MCHC: 34.5 g/dL (ref 32.0–36.0)
MCV: 92.4 fL (ref 79.3–98.0)
MONO#: 0.4 10*3/uL (ref 0.1–0.9)
MONO%: 7 % (ref 0.0–14.0)
NEUT#: 3.1 10*3/uL (ref 1.5–6.5)
NEUT%: 50.3 % (ref 39.0–75.0)
Platelets: 166 10*3/uL (ref 140–400)
RBC: 4.63 10*6/uL (ref 4.20–5.82)
RDW: 13.4 % (ref 11.0–14.6)
WBC: 6.2 10*3/uL (ref 4.0–10.3)
lymph#: 2.1 10*3/uL (ref 0.9–3.3)

## 2011-05-10 NOTE — Op Note (Signed)
  NAME:  Jason Noble, Jason Noble            ACCOUNT NO.:  [id number]  MEDICAL RECORD NO.:  [id number]  LOCATION:  [facility]                         FACILITY:  [facility]  PHYSICIAN:  Jefry H. 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  POSTOPERATIVE DIAGNOSIS:  Right base of tongue carcinoma.  PROCEDURE:  Esophagoscopy with direct laryngoscopy and biopsy of right base of tongue with frozen section evaluation.  SURGEON:  Jefry H. Pollyann Kennedy, MD  HISTORY:  This 70 year old presented with a right-sided neck mass and on needle aspirate was consistent with metastatic carcinoma.  PET scan revealed a very subtle finding in the right oropharynx.  Risks, benefits, alternatives, and complications of the procedure were explained to the patient who seemed to understand and agreed to surgery.  PROCEDURE IN DETAIL:  The patient was taken to the operating room and placed on the operating table in supine position.  Following induction of general endotracheal anesthesia, the patient was draped in a standard fashion. 1. Esophagoscopy.  A rigid cervical esophagoscope was entered into the     oral cavity, passed through the cricopharyngeal orifice and into     the esophagus and passed down as far as the scope would allow.  It     was slowly withdrawn while suctioning secretions and inspecting     circumferentially the wall of the esophagus.  There were no mucosal     lesions identified. 2. Direct laryngoscopy with biopsy.  The Jako laryngoscope was used to     evaluate the larynx, hypopharynx, and oropharyngeal structures.  A     subtle slight paleness and friability was then observed in the     right base of tongue.  The rest of the exam was unremarkable.     Palpation of this area confirmed a very subtle firmness  submucosally.  A large straight biopsy forceps was used to remove     two fragments of tissue which were sent for frozen section     evaluation.  This was positive for invasive squamous cell     carcinoma.  The scope was removed.  The patient was awakened,     extubated, and transferred to recovery in stable condition.     Jefry H. Pollyann Kennedy, MD     JHR/MEDQ  D:  04/18/2011  T:  04/18/2011  Job:  [id number]  Electronically Signed by Serena Colonel MD on 05/10/2011 01:07:43 PM

## 2011-05-16 ENCOUNTER — Encounter: Payer: Medicare Other | Admitting: Oncology

## 2011-05-16 ENCOUNTER — Other Ambulatory Visit: Payer: Self-pay | Admitting: Oncology

## 2011-05-16 ENCOUNTER — Encounter (HOSPITAL_BASED_OUTPATIENT_CLINIC_OR_DEPARTMENT_OTHER): Payer: Medicare Other | Admitting: Oncology

## 2011-05-16 DIAGNOSIS — C01 Malignant neoplasm of base of tongue: Secondary | ICD-10-CM

## 2011-05-16 LAB — COMPREHENSIVE METABOLIC PANEL
ALT: 17 U/L (ref 0–53)
AST: 19 U/L (ref 0–37)
Albumin: 3.7 g/dL (ref 3.5–5.2)
Alkaline Phosphatase: 70 U/L (ref 39–117)
BUN: 20 mg/dL (ref 6–23)
CO2: 29 mEq/L (ref 19–32)
Calcium: 9.8 mg/dL (ref 8.4–10.5)
Chloride: 105 mEq/L (ref 96–112)
Creatinine, Ser: 1.01 mg/dL (ref 0.50–1.35)
Glucose, Bld: 84 mg/dL (ref 70–99)
Potassium: 4.5 mEq/L (ref 3.5–5.3)
Sodium: 140 mEq/L (ref 135–145)
Total Bilirubin: 0.6 mg/dL (ref 0.3–1.2)
Total Protein: 7.1 g/dL (ref 6.0–8.3)

## 2011-05-16 LAB — CBC WITH DIFFERENTIAL/PLATELET
BASO%: 0.4 % (ref 0.0–2.0)
Basophils Absolute: 0 10*3/uL (ref 0.0–0.1)
EOS%: 6.9 % (ref 0.0–7.0)
Eosinophils Absolute: 0.4 10*3/uL (ref 0.0–0.5)
HCT: 41.4 % (ref 38.4–49.9)
HGB: 14.2 g/dL (ref 13.0–17.1)
LYMPH%: 33.6 % (ref 14.0–49.0)
MCH: 31.9 pg (ref 27.2–33.4)
MCHC: 34.4 g/dL (ref 32.0–36.0)
MCV: 92.6 fL (ref 79.3–98.0)
MONO#: 0.4 10*3/uL (ref 0.1–0.9)
MONO%: 7 % (ref 0.0–14.0)
NEUT#: 2.8 10*3/uL (ref 1.5–6.5)
NEUT%: 52.1 % (ref 39.0–75.0)
Platelets: 177 10*3/uL (ref 140–400)
RBC: 4.47 10*6/uL (ref 4.20–5.82)
RDW: 13.4 % (ref 11.0–14.6)
WBC: 5.3 10*3/uL (ref 4.0–10.3)
lymph#: 1.8 10*3/uL (ref 0.9–3.3)

## 2011-05-23 ENCOUNTER — Other Ambulatory Visit: Payer: Self-pay | Admitting: Oncology

## 2011-05-23 DIAGNOSIS — C029 Malignant neoplasm of tongue, unspecified: Secondary | ICD-10-CM

## 2011-05-24 ENCOUNTER — Ambulatory Visit: Payer: Medicare Other | Attending: Oncology | Admitting: *Deleted

## 2011-05-24 DIAGNOSIS — R1310 Dysphagia, unspecified: Secondary | ICD-10-CM | POA: Insufficient documentation

## 2011-05-24 DIAGNOSIS — IMO0001 Reserved for inherently not codable concepts without codable children: Secondary | ICD-10-CM | POA: Insufficient documentation

## 2011-05-30 ENCOUNTER — Ambulatory Visit (HOSPITAL_COMMUNITY): Payer: Self-pay

## 2011-05-30 ENCOUNTER — Other Ambulatory Visit (HOSPITAL_COMMUNITY): Payer: Self-pay

## 2011-06-01 ENCOUNTER — Encounter (HOSPITAL_BASED_OUTPATIENT_CLINIC_OR_DEPARTMENT_OTHER): Payer: Medicare Other | Admitting: Oncology

## 2011-06-01 DIAGNOSIS — K08409 Partial loss of teeth, unspecified cause, unspecified class: Secondary | ICD-10-CM

## 2011-06-01 DIAGNOSIS — C01 Malignant neoplasm of base of tongue: Secondary | ICD-10-CM

## 2011-06-01 DIAGNOSIS — K08109 Complete loss of teeth, unspecified cause, unspecified class: Secondary | ICD-10-CM

## 2011-06-01 DIAGNOSIS — Z0189 Encounter for other specified special examinations: Secondary | ICD-10-CM

## 2011-06-13 ENCOUNTER — Ambulatory Visit (HOSPITAL_COMMUNITY)
Admission: RE | Admit: 2011-06-13 | Discharge: 2011-06-13 | Disposition: A | Discharge status: Home / Self Care | Payer: Medicare Other | Source: Ambulatory Visit | Attending: Oncology | Admitting: Oncology

## 2011-06-13 ENCOUNTER — Ambulatory Visit (HOSPITAL_COMMUNITY): Payer: Medicare Other

## 2011-06-13 DIAGNOSIS — C760 Malignant neoplasm of head, face and neck: Secondary | ICD-10-CM | POA: Insufficient documentation

## 2011-06-13 DIAGNOSIS — C029 Malignant neoplasm of tongue, unspecified: Secondary | ICD-10-CM

## 2011-06-13 LAB — CBC
HCT: 43.2 % (ref 39.0–52.0)
Hemoglobin: 14.8 g/dL (ref 13.0–17.0)
MCH: 30.8 pg (ref 26.0–34.0)
MCHC: 34.3 g/dL (ref 30.0–36.0)
MCV: 89.8 fL (ref 78.0–100.0)
Platelets: 226 10*3/uL (ref 150–400)
RBC: 4.81 MIL/uL (ref 4.22–5.81)
RDW: 12.5 % (ref 11.5–15.5)
WBC: 7.2 10*3/uL (ref 4.0–10.5)

## 2011-06-13 LAB — PROTIME-INR
INR: 1.03 (ref 0.00–1.49)
Prothrombin Time: 13.7 seconds (ref 11.6–15.2)

## 2011-06-13 LAB — APTT: aPTT: 33 seconds (ref 24–37)

## 2011-06-18 ENCOUNTER — Other Ambulatory Visit: Payer: Self-pay | Admitting: Oncology

## 2011-06-18 ENCOUNTER — Encounter (HOSPITAL_BASED_OUTPATIENT_CLINIC_OR_DEPARTMENT_OTHER): Payer: Medicare Other | Admitting: Oncology

## 2011-06-18 DIAGNOSIS — C01 Malignant neoplasm of base of tongue: Secondary | ICD-10-CM

## 2011-06-18 LAB — CBC WITH DIFFERENTIAL/PLATELET
BASO%: 0.4 % (ref 0.0–2.0)
Basophils Absolute: 0 10*3/uL (ref 0.0–0.1)
EOS%: 9.7 % — ABNORMAL HIGH (ref 0.0–7.0)
Eosinophils Absolute: 0.7 10*3/uL — ABNORMAL HIGH (ref 0.0–0.5)
HCT: 41.5 % (ref 38.4–49.9)
HGB: 14.2 g/dL (ref 13.0–17.1)
LYMPH%: 33.3 % (ref 14.0–49.0)
MCH: 31.5 pg (ref 27.2–33.4)
MCHC: 34.3 g/dL (ref 32.0–36.0)
MCV: 91.8 fL (ref 79.3–98.0)
MONO#: 0.6 10*3/uL (ref 0.1–0.9)
MONO%: 7.8 % (ref 0.0–14.0)
NEUT#: 3.5 10*3/uL (ref 1.5–6.5)
NEUT%: 48.8 % (ref 39.0–75.0)
Platelets: 206 10*3/uL (ref 140–400)
RBC: 4.52 10*6/uL (ref 4.20–5.82)
RDW: 12.9 % (ref 11.0–14.6)
WBC: 7.1 10*3/uL (ref 4.0–10.3)
lymph#: 2.4 10*3/uL (ref 0.9–3.3)

## 2011-06-18 LAB — COMPREHENSIVE METABOLIC PANEL
ALT: 13 U/L (ref 0–53)
AST: 16 U/L (ref 0–37)
Albumin: 4 g/dL (ref 3.5–5.2)
Alkaline Phosphatase: 83 U/L (ref 39–117)
BUN: 27 mg/dL — ABNORMAL HIGH (ref 6–23)
CO2: 22 mEq/L (ref 19–32)
Calcium: 9.5 mg/dL (ref 8.4–10.5)
Chloride: 105 mEq/L (ref 96–112)
Creatinine, Ser: 0.91 mg/dL (ref 0.50–1.35)
Glucose, Bld: 89 mg/dL (ref 70–99)
Potassium: 4.5 mEq/L (ref 3.5–5.3)
Sodium: 137 mEq/L (ref 135–145)
Total Bilirubin: 0.5 mg/dL (ref 0.3–1.2)
Total Protein: 6.9 g/dL (ref 6.0–8.3)

## 2011-06-20 ENCOUNTER — Encounter: Payer: Self-pay | Admitting: *Deleted

## 2011-06-22 ENCOUNTER — Encounter (HOSPITAL_BASED_OUTPATIENT_CLINIC_OR_DEPARTMENT_OTHER): Payer: Medicare Other | Admitting: Oncology

## 2011-06-22 ENCOUNTER — Other Ambulatory Visit: Payer: Self-pay | Admitting: Oncology

## 2011-06-22 DIAGNOSIS — C01 Malignant neoplasm of base of tongue: Secondary | ICD-10-CM

## 2011-06-22 LAB — CBC WITH DIFFERENTIAL/PLATELET
BASO%: 0 % (ref 0.0–2.0)
Basophils Absolute: 0 10*3/uL (ref 0.0–0.1)
EOS%: 2.9 % (ref 0.0–7.0)
Eosinophils Absolute: 0.2 10*3/uL (ref 0.0–0.5)
HCT: 39.6 % (ref 38.4–49.9)
HGB: 13.7 g/dL (ref 13.0–17.1)
LYMPH%: 20.3 % (ref 14.0–49.0)
MCH: 31.5 pg (ref 27.2–33.4)
MCHC: 34.5 g/dL (ref 32.0–36.0)
MCV: 91.2 fL (ref 79.3–98.0)
MONO#: 0.5 10*3/uL (ref 0.1–0.9)
MONO%: 8.8 % (ref 0.0–14.0)
NEUT#: 4 10*3/uL (ref 1.5–6.5)
NEUT%: 68 % (ref 39.0–75.0)
Platelets: 217 10*3/uL (ref 140–400)
RBC: 4.34 10*6/uL (ref 4.20–5.82)
RDW: 13 % (ref 11.0–14.6)
WBC: 5.9 10*3/uL (ref 4.0–10.3)
lymph#: 1.2 10*3/uL (ref 0.9–3.3)

## 2011-06-22 LAB — BASIC METABOLIC PANEL
BUN: 23 mg/dL (ref 6–23)
CO2: 29 mEq/L (ref 19–32)
Calcium: 9.7 mg/dL (ref 8.4–10.5)
Chloride: 99 mEq/L (ref 96–112)
Creatinine, Ser: 0.97 mg/dL (ref 0.50–1.35)
Glucose, Bld: 120 mg/dL — ABNORMAL HIGH (ref 70–99)
Potassium: 4.3 mEq/L (ref 3.5–5.3)
Sodium: 134 mEq/L — ABNORMAL LOW (ref 135–145)

## 2011-06-25 ENCOUNTER — Other Ambulatory Visit: Payer: Self-pay | Admitting: Oncology

## 2011-06-25 ENCOUNTER — Encounter (HOSPITAL_BASED_OUTPATIENT_CLINIC_OR_DEPARTMENT_OTHER): Payer: Medicare Other | Admitting: Oncology

## 2011-06-25 DIAGNOSIS — C109 Malignant neoplasm of oropharynx, unspecified: Secondary | ICD-10-CM

## 2011-06-25 DIAGNOSIS — H918X9 Other specified hearing loss, unspecified ear: Secondary | ICD-10-CM

## 2011-06-25 DIAGNOSIS — C01 Malignant neoplasm of base of tongue: Secondary | ICD-10-CM

## 2011-06-25 DIAGNOSIS — B977 Papillomavirus as the cause of diseases classified elsewhere: Secondary | ICD-10-CM

## 2011-06-25 DIAGNOSIS — Z23 Encounter for immunization: Secondary | ICD-10-CM

## 2011-06-25 DIAGNOSIS — Z5111 Encounter for antineoplastic chemotherapy: Secondary | ICD-10-CM

## 2011-06-25 LAB — CBC WITH DIFFERENTIAL/PLATELET
BASO%: 0.3 % (ref 0.0–2.0)
Basophils Absolute: 0 10*3/uL (ref 0.0–0.1)
EOS%: 6 % (ref 0.0–7.0)
Eosinophils Absolute: 0.4 10*3/uL (ref 0.0–0.5)
HCT: 40.2 % (ref 38.4–49.9)
HGB: 13.9 g/dL (ref 13.0–17.1)
LYMPH%: 21.5 % (ref 14.0–49.0)
MCH: 30.8 pg (ref 27.2–33.4)
MCHC: 34.6 g/dL (ref 32.0–36.0)
MCV: 88.9 fL (ref 79.3–98.0)
MONO#: 0.6 10*3/uL (ref 0.1–0.9)
MONO%: 9.1 % (ref 0.0–14.0)
NEUT#: 4.3 10*3/uL (ref 1.5–6.5)
NEUT%: 63.1 % (ref 39.0–75.0)
Platelets: 194 10*3/uL (ref 140–400)
RBC: 4.52 10*6/uL (ref 4.20–5.82)
RDW: 12.5 % (ref 11.0–14.6)
WBC: 6.8 10*3/uL (ref 4.0–10.3)
lymph#: 1.5 10*3/uL (ref 0.9–3.3)
nRBC: 0 % (ref 0–0)

## 2011-06-25 LAB — BASIC METABOLIC PANEL
BUN: 19 mg/dL (ref 6–23)
CO2: 24 mEq/L (ref 19–32)
Calcium: 8.9 mg/dL (ref 8.4–10.5)
Chloride: 102 mEq/L (ref 96–112)
Creatinine, Ser: 0.87 mg/dL (ref 0.50–1.35)
Glucose, Bld: 186 mg/dL — ABNORMAL HIGH (ref 70–99)
Potassium: 4.7 mEq/L (ref 3.5–5.3)
Sodium: 134 mEq/L — ABNORMAL LOW (ref 135–145)

## 2011-06-28 ENCOUNTER — Encounter: Payer: Self-pay | Admitting: Oncology

## 2011-06-28 DIAGNOSIS — C109 Malignant neoplasm of oropharynx, unspecified: Secondary | ICD-10-CM | POA: Insufficient documentation

## 2011-06-29 ENCOUNTER — Encounter (HOSPITAL_BASED_OUTPATIENT_CLINIC_OR_DEPARTMENT_OTHER): Payer: Medicare Other | Admitting: Oncology

## 2011-06-29 ENCOUNTER — Other Ambulatory Visit: Payer: Self-pay | Admitting: Oncology

## 2011-06-29 DIAGNOSIS — C01 Malignant neoplasm of base of tongue: Secondary | ICD-10-CM

## 2011-06-29 LAB — COMPREHENSIVE METABOLIC PANEL
ALT: 21 U/L (ref 0–53)
AST: 22 U/L (ref 0–37)
Albumin: 3.2 g/dL — ABNORMAL LOW (ref 3.5–5.2)
Alkaline Phosphatase: 84 U/L (ref 39–117)
BUN: 21 mg/dL (ref 6–23)
CO2: 29 mEq/L (ref 19–32)
Calcium: 9.5 mg/dL (ref 8.4–10.5)
Chloride: 98 mEq/L (ref 96–112)
Creatinine, Ser: 0.9 mg/dL (ref 0.50–1.35)
Glucose, Bld: 109 mg/dL — ABNORMAL HIGH (ref 70–99)
Potassium: 4.3 mEq/L (ref 3.5–5.3)
Sodium: 133 mEq/L — ABNORMAL LOW (ref 135–145)
Total Bilirubin: 0.5 mg/dL (ref 0.3–1.2)
Total Protein: 6.9 g/dL (ref 6.0–8.3)

## 2011-06-29 LAB — CBC WITH DIFFERENTIAL/PLATELET
BASO%: 0.3 % (ref 0.0–2.0)
Basophils Absolute: 0 10*3/uL (ref 0.0–0.1)
EOS%: 1.5 % (ref 0.0–7.0)
Eosinophils Absolute: 0.1 10*3/uL (ref 0.0–0.5)
HCT: 37.5 % — ABNORMAL LOW (ref 38.4–49.9)
HGB: 12.8 g/dL — ABNORMAL LOW (ref 13.0–17.1)
LYMPH%: 14.7 % (ref 14.0–49.0)
MCH: 31.3 pg (ref 27.2–33.4)
MCHC: 34.2 g/dL (ref 32.0–36.0)
MCV: 91.5 fL (ref 79.3–98.0)
MONO#: 0.4 10*3/uL (ref 0.1–0.9)
MONO%: 8.1 % (ref 0.0–14.0)
NEUT#: 3.9 10*3/uL (ref 1.5–6.5)
NEUT%: 75.4 % — ABNORMAL HIGH (ref 39.0–75.0)
Platelets: 186 10*3/uL (ref 140–400)
RBC: 4.09 10*6/uL — ABNORMAL LOW (ref 4.20–5.82)
RDW: 12.8 % (ref 11.0–14.6)
WBC: 5.2 10*3/uL (ref 4.0–10.3)
lymph#: 0.8 10*3/uL — ABNORMAL LOW (ref 0.9–3.3)

## 2011-07-02 ENCOUNTER — Encounter (HOSPITAL_BASED_OUTPATIENT_CLINIC_OR_DEPARTMENT_OTHER): Payer: No Typology Code available for payment source | Admitting: Oncology

## 2011-07-02 ENCOUNTER — Encounter: Payer: Self-pay | Admitting: *Deleted

## 2011-07-02 DIAGNOSIS — C01 Malignant neoplasm of base of tongue: Secondary | ICD-10-CM

## 2011-07-02 DIAGNOSIS — Z5111 Encounter for antineoplastic chemotherapy: Secondary | ICD-10-CM

## 2011-07-05 ENCOUNTER — Other Ambulatory Visit: Payer: Self-pay | Admitting: Oncology

## 2011-07-06 ENCOUNTER — Encounter (HOSPITAL_BASED_OUTPATIENT_CLINIC_OR_DEPARTMENT_OTHER): Payer: Medicare Other | Admitting: Oncology

## 2011-07-06 ENCOUNTER — Other Ambulatory Visit: Payer: Self-pay | Admitting: Oncology

## 2011-07-06 DIAGNOSIS — C01 Malignant neoplasm of base of tongue: Secondary | ICD-10-CM

## 2011-07-06 LAB — CBC WITH DIFFERENTIAL/PLATELET
BASO%: 0.2 % (ref 0.0–2.0)
Basophils Absolute: 0 10*3/uL (ref 0.0–0.1)
EOS%: 1.1 % (ref 0.0–7.0)
Eosinophils Absolute: 0.1 10*3/uL (ref 0.0–0.5)
HCT: 34.9 % — ABNORMAL LOW (ref 38.4–49.9)
HGB: 12.1 g/dL — ABNORMAL LOW (ref 13.0–17.1)
LYMPH%: 13.6 % — ABNORMAL LOW (ref 14.0–49.0)
MCH: 31.9 pg (ref 27.2–33.4)
MCHC: 34.7 g/dL (ref 32.0–36.0)
MCV: 92.1 fL (ref 79.3–98.0)
MONO#: 0.5 10*3/uL (ref 0.1–0.9)
MONO%: 9.4 % (ref 0.0–14.0)
NEUT#: 3.9 10*3/uL (ref 1.5–6.5)
NEUT%: 75.7 % — ABNORMAL HIGH (ref 39.0–75.0)
Platelets: 167 10*3/uL (ref 140–400)
RBC: 3.8 10*6/uL — ABNORMAL LOW (ref 4.20–5.82)
RDW: 13 % (ref 11.0–14.6)
WBC: 5.2 10*3/uL (ref 4.0–10.3)
lymph#: 0.7 10*3/uL — ABNORMAL LOW (ref 0.9–3.3)

## 2011-07-06 LAB — COMPREHENSIVE METABOLIC PANEL
ALT: 14 U/L (ref 0–53)
AST: 14 U/L (ref 0–37)
Albumin: 3.6 g/dL (ref 3.5–5.2)
Alkaline Phosphatase: 74 U/L (ref 39–117)
BUN: 28 mg/dL — ABNORMAL HIGH (ref 6–23)
CO2: 27 mEq/L (ref 19–32)
Calcium: 8.8 mg/dL (ref 8.4–10.5)
Chloride: 100 mEq/L (ref 96–112)
Creatinine, Ser: 1.06 mg/dL (ref 0.50–1.35)
Glucose, Bld: 101 mg/dL — ABNORMAL HIGH (ref 70–99)
Potassium: 5 mEq/L (ref 3.5–5.3)
Sodium: 135 mEq/L (ref 135–145)
Total Bilirubin: 0.5 mg/dL (ref 0.3–1.2)
Total Protein: 6.2 g/dL (ref 6.0–8.3)

## 2011-07-08 ENCOUNTER — Other Ambulatory Visit: Payer: Self-pay | Admitting: Oncology

## 2011-07-08 ENCOUNTER — Encounter: Payer: Self-pay | Admitting: Oncology

## 2011-07-09 ENCOUNTER — Encounter (HOSPITAL_BASED_OUTPATIENT_CLINIC_OR_DEPARTMENT_OTHER): Payer: Medicare Other | Admitting: Oncology

## 2011-07-09 DIAGNOSIS — B977 Papillomavirus as the cause of diseases classified elsewhere: Secondary | ICD-10-CM

## 2011-07-09 DIAGNOSIS — Z5111 Encounter for antineoplastic chemotherapy: Secondary | ICD-10-CM

## 2011-07-09 DIAGNOSIS — C01 Malignant neoplasm of base of tongue: Secondary | ICD-10-CM

## 2011-07-10 ENCOUNTER — Other Ambulatory Visit (HOSPITAL_COMMUNITY): Payer: Self-pay | Admitting: Dentistry

## 2011-07-10 DIAGNOSIS — C01 Malignant neoplasm of base of tongue: Secondary | ICD-10-CM

## 2011-07-10 DIAGNOSIS — Z09 Encounter for follow-up examination after completed treatment for conditions other than malignant neoplasm: Secondary | ICD-10-CM

## 2011-07-13 ENCOUNTER — Encounter: Payer: Self-pay | Admitting: *Deleted

## 2011-07-13 ENCOUNTER — Other Ambulatory Visit: Payer: Self-pay | Admitting: Oncology

## 2011-07-13 ENCOUNTER — Encounter (HOSPITAL_BASED_OUTPATIENT_CLINIC_OR_DEPARTMENT_OTHER): Payer: Medicare Other | Admitting: Oncology

## 2011-07-13 DIAGNOSIS — C01 Malignant neoplasm of base of tongue: Secondary | ICD-10-CM

## 2011-07-13 LAB — CBC WITH DIFFERENTIAL/PLATELET
BASO%: 0.3 % (ref 0.0–2.0)
Basophils Absolute: 0 10*3/uL (ref 0.0–0.1)
EOS%: 1.9 % (ref 0.0–7.0)
Eosinophils Absolute: 0.1 10*3/uL (ref 0.0–0.5)
HCT: 32.8 % — ABNORMAL LOW (ref 38.4–49.9)
HGB: 11.3 g/dL — ABNORMAL LOW (ref 13.0–17.1)
LYMPH%: 15.8 % (ref 14.0–49.0)
MCH: 32.1 pg (ref 27.2–33.4)
MCHC: 34.4 g/dL (ref 32.0–36.0)
MCV: 93.2 fL (ref 79.3–98.0)
MONO#: 0.4 10*3/uL (ref 0.1–0.9)
MONO%: 14.8 % — ABNORMAL HIGH (ref 0.0–14.0)
NEUT#: 2 10*3/uL (ref 1.5–6.5)
NEUT%: 67.2 % (ref 39.0–75.0)
Platelets: 149 10*3/uL (ref 140–400)
RBC: 3.52 10*6/uL — ABNORMAL LOW (ref 4.20–5.82)
RDW: 13.4 % (ref 11.0–14.6)
WBC: 3 10*3/uL — ABNORMAL LOW (ref 4.0–10.3)
lymph#: 0.5 10*3/uL — ABNORMAL LOW (ref 0.9–3.3)

## 2011-07-13 LAB — COMPREHENSIVE METABOLIC PANEL
ALT: 12 U/L (ref 0–53)
AST: 14 U/L (ref 0–37)
Albumin: 3 g/dL — ABNORMAL LOW (ref 3.5–5.2)
Alkaline Phosphatase: 84 U/L (ref 39–117)
BUN: 26 mg/dL — ABNORMAL HIGH (ref 6–23)
CO2: 31 mEq/L (ref 19–32)
Calcium: 9.1 mg/dL (ref 8.4–10.5)
Chloride: 99 mEq/L (ref 96–112)
Creatinine, Ser: 0.91 mg/dL (ref 0.50–1.35)
Glucose, Bld: 117 mg/dL — ABNORMAL HIGH (ref 70–99)
Potassium: 4.4 mEq/L (ref 3.5–5.3)
Sodium: 135 mEq/L (ref 135–145)
Total Bilirubin: 0.3 mg/dL (ref 0.3–1.2)
Total Protein: 6.3 g/dL (ref 6.0–8.3)

## 2011-07-16 ENCOUNTER — Encounter (HOSPITAL_BASED_OUTPATIENT_CLINIC_OR_DEPARTMENT_OTHER): Payer: Medicare Other | Admitting: Oncology

## 2011-07-16 DIAGNOSIS — C01 Malignant neoplasm of base of tongue: Secondary | ICD-10-CM

## 2011-07-16 DIAGNOSIS — Z5111 Encounter for antineoplastic chemotherapy: Secondary | ICD-10-CM

## 2011-07-17 ENCOUNTER — Other Ambulatory Visit: Payer: Self-pay | Admitting: Oncology

## 2011-07-19 ENCOUNTER — Encounter: Payer: Self-pay | Admitting: *Deleted

## 2011-07-20 ENCOUNTER — Other Ambulatory Visit: Payer: Self-pay | Admitting: Oncology

## 2011-07-20 LAB — CBC WITH DIFFERENTIAL/PLATELET
BASO%: 0.1 % (ref 0.0–2.0)
Basophils Absolute: 0 10*3/uL (ref 0.0–0.1)
EOS%: 0.9 % (ref 0.0–7.0)
Eosinophils Absolute: 0 10*3/uL (ref 0.0–0.5)
HCT: 34.6 % — ABNORMAL LOW (ref 38.4–49.9)
HGB: 11.8 g/dL — ABNORMAL LOW (ref 13.0–17.1)
LYMPH%: 9.7 % — ABNORMAL LOW (ref 14.0–49.0)
MCH: 31.9 pg (ref 27.2–33.4)
MCHC: 34.1 g/dL (ref 32.0–36.0)
MCV: 93.4 fL (ref 79.3–98.0)
MONO#: 0.4 10*3/uL (ref 0.1–0.9)
MONO%: 9 % (ref 0.0–14.0)
NEUT#: 3.3 10*3/uL (ref 1.5–6.5)
NEUT%: 80.3 % — ABNORMAL HIGH (ref 39.0–75.0)
Platelets: 176 10*3/uL (ref 140–400)
RBC: 3.7 10*6/uL — ABNORMAL LOW (ref 4.20–5.82)
RDW: 14.4 % (ref 11.0–14.6)
WBC: 4.1 10*3/uL (ref 4.0–10.3)
lymph#: 0.4 10*3/uL — ABNORMAL LOW (ref 0.9–3.3)

## 2011-07-20 LAB — COMPREHENSIVE METABOLIC PANEL
ALT: 14 U/L (ref 0–53)
AST: 17 U/L (ref 0–37)
Albumin: 3.4 g/dL — ABNORMAL LOW (ref 3.5–5.2)
Alkaline Phosphatase: 88 U/L (ref 39–117)
BUN: 28 mg/dL — ABNORMAL HIGH (ref 6–23)
CO2: 30 mEq/L (ref 19–32)
Calcium: 9.5 mg/dL (ref 8.4–10.5)
Chloride: 96 mEq/L (ref 96–112)
Creatinine, Ser: 0.94 mg/dL (ref 0.50–1.35)
Glucose, Bld: 100 mg/dL — ABNORMAL HIGH (ref 70–99)
Potassium: 5 mEq/L (ref 3.5–5.3)
Sodium: 132 mEq/L — ABNORMAL LOW (ref 135–145)
Total Bilirubin: 0.6 mg/dL (ref 0.3–1.2)
Total Protein: 6.8 g/dL (ref 6.0–8.3)

## 2011-07-21 ENCOUNTER — Encounter: Payer: Self-pay | Admitting: *Deleted

## 2011-07-21 ENCOUNTER — Other Ambulatory Visit: Payer: Self-pay | Admitting: Oncology

## 2011-07-23 ENCOUNTER — Ambulatory Visit (HOSPITAL_BASED_OUTPATIENT_CLINIC_OR_DEPARTMENT_OTHER): Payer: Medicare Other | Admitting: Oncology

## 2011-07-23 ENCOUNTER — Encounter: Payer: Self-pay | Admitting: Oncology

## 2011-07-23 ENCOUNTER — Other Ambulatory Visit: Payer: Self-pay | Admitting: *Deleted

## 2011-07-23 ENCOUNTER — Ambulatory Visit: Payer: Self-pay

## 2011-07-23 ENCOUNTER — Ambulatory Visit: Payer: Self-pay | Admitting: *Deleted

## 2011-07-23 ENCOUNTER — Ambulatory Visit
Admission: RE | Admit: 2011-07-23 | Discharge: 2011-07-23 | Disposition: A | Discharge status: Home / Self Care | Payer: Medicare Other | Source: Ambulatory Visit | Attending: Radiation Oncology | Admitting: Radiation Oncology

## 2011-07-23 ENCOUNTER — Ambulatory Visit (HOSPITAL_BASED_OUTPATIENT_CLINIC_OR_DEPARTMENT_OTHER): Payer: Medicare Other

## 2011-07-23 VITALS — BP 116/71 | HR 71 | Temp 98.2°F | Ht 69.5 in | Wt 181.5 lb

## 2011-07-23 DIAGNOSIS — C109 Malignant neoplasm of oropharynx, unspecified: Secondary | ICD-10-CM

## 2011-07-23 DIAGNOSIS — B977 Papillomavirus as the cause of diseases classified elsewhere: Secondary | ICD-10-CM

## 2011-07-23 DIAGNOSIS — C01 Malignant neoplasm of base of tongue: Secondary | ICD-10-CM

## 2011-07-23 DIAGNOSIS — Z5111 Encounter for antineoplastic chemotherapy: Secondary | ICD-10-CM

## 2011-07-23 DIAGNOSIS — K5900 Constipation, unspecified: Secondary | ICD-10-CM

## 2011-07-23 DIAGNOSIS — K1231 Oral mucositis (ulcerative) due to antineoplastic therapy: Secondary | ICD-10-CM

## 2011-07-23 DIAGNOSIS — K117 Disturbances of salivary secretion: Secondary | ICD-10-CM

## 2011-07-23 MED ORDER — POTASSIUM CHLORIDE 2 MEQ/ML IV SOLN
Freq: Once | INTRAVENOUS | Status: AC
  Administered 2011-07-23: 11:00:00 via INTRAVENOUS
  Filled 2011-07-23: qty 10

## 2011-07-23 MED ORDER — SODIUM CHLORIDE 0.9 % IV SOLN
40.0000 mg/m2 | Freq: Once | INTRAVENOUS | Status: AC
  Administered 2011-07-23: 81 mg via INTRAVENOUS
  Filled 2011-07-23: qty 81

## 2011-07-23 MED ORDER — DEXAMETHASONE SODIUM PHOSPHATE 4 MG/ML IJ SOLN
20.0000 mg | Freq: Once | INTRAMUSCULAR | Status: AC
  Administered 2011-07-23: 20 mg via INTRAVENOUS

## 2011-07-23 MED ORDER — LACTULOSE 10 GM/15ML PO SOLN: 30.0000 g | ORAL | Status: AC | PRN

## 2011-07-23 MED ORDER — OSMOLITE 1.5 CAL PO LIQD: 237.0000 mL | Freq: Three times a day (TID) | ORAL | Status: DC | PRN

## 2011-07-23 MED ORDER — ONDANSETRON 16 MG/50ML IVPB (CHCC)
16.0000 mg | Freq: Once | INTRAVENOUS | Status: AC
  Administered 2011-07-23: 16 mg via INTRAVENOUS

## 2011-07-23 MED ORDER — MORPHINE SULFATE 4 MG/ML IJ SOLN
4.0000 mg | Freq: Once | INTRAMUSCULAR | Status: AC
  Administered 2011-07-23: 4 mg via INTRAVENOUS

## 2011-07-23 NOTE — Patient Instructions (Signed)
Pt aware of next appointment. Drink plenty of fluids

## 2011-07-23 NOTE — Progress Notes (Signed)
Pt voided 250ccs prior to Cisplatin, voided 625 during remainder of treatment.

## 2011-07-23 NOTE — Progress Notes (Deleted)
The patient reports that he has had some issues with constipation.  He continues to tolerate 7 cans of Jevity 1.5 daily to provide 2485 calories, 106 grams of protein and 2520 mL of free water.  His weight is stable at 181.8 pounds.  He reports increased vomiting for which the physician has prescribed increased Ativan and increased peroxide rinses. The patient would like to change the tube feedings to provide a bit less fiber.  NUTRITION DIAGNOSIS:  Inadequate oral intake continues.  INTERVENTION:  We will change tube feedings to 4 cans of Jevity 1.5 plus 3 cans of Osmolite 1.5 to total 7 cans of formula daily via PEG.  The patient will begin with one can of Jevity 1.5 via his PEG with 120 mL of free water before and after each bolus.  He will alternate Jevity 1.5 with Osmolite 1.5  throughout the day every 2-3 hours to give him a total of 7 cans daily.  He will continue to eat and drink as he can.  MONITORY EVALUATION AND GOALS:  The patient will tolerate tube feeding changes to promote regular bowel movements.  NEXT VISIT:  Monday, November 12th.    ______________________________ Zenovia Jarred, RD, LDN Clinical Nutrition Specialist BN/MEDQ  D:  07/23/2011  T:  07/23/2011  Job:  [id number]

## 2011-07-24 ENCOUNTER — Telehealth: Payer: Self-pay | Admitting: Radiation Oncology

## 2011-07-24 ENCOUNTER — Ambulatory Visit
Admission: RE | Admit: 2011-07-24 | Discharge: 2011-07-24 | Disposition: A | Discharge status: Home / Self Care | Payer: Medicare Other | Source: Ambulatory Visit | Attending: Radiation Oncology | Admitting: Radiation Oncology

## 2011-07-24 ENCOUNTER — Encounter: Payer: Self-pay | Admitting: Certified Registered Nurse Anesthetist

## 2011-07-24 NOTE — Telephone Encounter (Signed)
Patient phoned today wanting to know "what lotion to put on his burns?" Instructed patient to continue to use Biafine on treatment area. Instructed patient that he could use Biafine up to three times a day on the treated area but to ensure no lotion was on the treated area 4 hours prior to XRT. Hiccups persist despite prescribed medication. Encouraged patient to take medication for hiccups 30 minutes prior to XRT so that treatment is not delayed due to movement. Patient reports persistent nausea and vomiting relieved by several scheduled anti emetics and anti anxiety. Reinforced self care education and encouraged patient reminding him that he is on the "home stretch and almost completed treatment." Patient reports understanding of education and relief of anxiety. Encouraged patient to contact staff with future needs.

## 2011-07-24 NOTE — Progress Notes (Deleted)
[facility] OFFICE PROGRESS NOTE  No primary provider on file. No primary provider on file.  DIAGNOSIS: No diagnosis found.  CURRENT THERAPY:  INTERVAL HISTORY: Jason Noble 70 y.o. male returns for *** regular *** visit for followup of ***   MEDICAL HISTORY: Past Medical History  Diagnosis Date  . 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 Coronary heart disease   . Skin cancer     basal cell, squamous cell  . Prostate cancer 2002  . Kidney stones   . Basal cell carcinoma of skin   . Squamous cell carcinoma of skin   . Cholecystitis   . Abscess of gallbladder   . CVA (cerebral infarction) 2007    INTERIM HISTORY: has HYPERCHOLESTEROLEMIA; MYOCARDIAL INFARCTION, HX OF; CAD, ARTERY BYPASS GRAFT; OTHER RETROPERITONEAL ABSCESS; PATENT FORAMEN OVALE; MALAISE AND FATIGUE; CHEST WALL PAIN, ACUTE; PROSTATE CANCER, HX OF; CEREBROVASCULAR ACCIDENT, HX OF; CHOLECYSTECTOMY, HX OF; ANEURYSM, THORACIC AORTIC; and Oropharynx cancer on his problem list.    ALLERGIES:   has no known allergies.  MEDICATIONS: Jason Noble does not currently have medications on file.  SURGICAL HISTORY:  Past Surgical History  Procedure Date  . Prostatectomy   . Peg tube placement 06/13/11 at [facility]    REVIEW OF SYSTEMS:  {Ros - complete:30496}   PHYSICAL EXAMINATION: ECOG PERFORMANCE STATUS: {CHL ONC ECOG ZO:1096045409}  Filed Vitals:   07/23/11 0848  BP: 116/71  Pulse: 71  Temp: 98.2 F (36.8 C)    GENERAL:{CHL ONC PE [phone]} SKIN: {CHL ONC PE WJXB:[id number]} HEAD: {CHL ONC PE HYQM:5784696295} EYES: {CHL ONC PE MWUX:3244010272} EARS: {CHL ONC PE ZDGU:4403474259} OROPHARYNX:{CHL ONC PE OROPHARYNX:[phone]}  NECK: {CHL ONC PE DGLO:7564332951} LYMPH:  {CHL ONC PE OACZY:6063016010} BREAST:{CHL ONC PE [phone]} LUNGS: {CHL ONC PE XNATF:5732202542} HEART: {CHL ONC PE HCWCB:7628315176} ABDOMEN:{CHL ONC PE ABDOMEN:[phone]} BACK: {CHL ONC PE  HYWV:3710626948} EXTREMITIES:{CHL ONC PE [phone]}  NEURO: {CHL ONC PE [phone]} PELVIC:{CHL ONC PE PELVIC:[phone]} RECTAL: {CHL ONC PE [phone]}    LABORATORY DATA:

## 2011-07-24 NOTE — Progress Notes (Signed)
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  CURRENT THERAPY:  started concurrent chemoradiation therapy with weekly cisplatin and daily radiation therapy on June 18, 2011.  INTERVAL HISTORY:  Jason Noble returns to the clinic today with his wife for regular followup.  He reports that he has been having more mucositis.  He is only doing hydrogen peroxide mouth rinse twice a day as opposed to 4 times a day as per my previous instruction.  He has odynophagia/dysphagia from xerostomia and mucositis and has, therefore, only been drinking water by mouth and is using the PEG tube exclusively for nutritional requirements.  The mouth rinses get worse with eating and drinking and, therefore, he has only been doing water.  He takes fentanyl patch 12 mg/hr change every 3 days; however, he still requires Vicodin elixir up to 4-6 times a day.  He has constipation; however, he is not doing the stool softeners every day.  Once in awhile, he does the MiraLax and also has to do manual decompression.  The PEG tube is clean, dry, and intact without any major problems per his report.  He has nausea because of a gagging sensation when he wakes up in the morning. However, he does not have intractable nausea or vomiting.  He denies any tinnitus, hearing loss, or headache.  He does have some skin breakthrough on the radiation therapy site.  However, on the right side, he is using Biafine cream per Radiation Oncology.  Current Outpatient Prescriptions  Medication Sig Dispense Refill  . LORazepam (ATIVAN) 0.5 MG tablet Take 0.5 mg by mouth every 6 (six) hours as needed.        . prochlorperazine (COMPAZINE) 10 MG tablet Take 10 mg by mouth every 6 (six) hours as needed.        . vitamin E 400 UNIT capsule Take 400 Units by mouth daily.        . Ascorbic Acid (VITAMIN C) 500 MG tablet Take 500 mg by mouth 2 (two) times daily.        Marland Kitchen  aspirin EC 325 MG EC tablet Take 325 mg by mouth daily.        Marland Kitchen atorvastatin (LIPITOR) 40 MG tablet Take 40 mg by mouth daily.        Marland Kitchen b complex vitamins capsule Take 1 capsule by mouth daily.        . B Complex-Folic Acid (B-50 CR PO) Take 1 tablet by mouth daily.        . Cholecalciferol (VITAMIN D) 2000 UNITS CAPS Take 1 capsule by mouth daily.        . Garlic Oil 1000 MG CAPS Take 1 capsule by mouth daily.        . Glucosamine-Chondroitin 500-400 MG CAPS Take 1 capsule by mouth daily.        Marland Kitchen HYDROcodone-acetaminophen (LORTAB) 7.5-500 MG/15ML solution Take 15 mLs by mouth every 6 (six) hours as needed. lortab elixir 7.5/500 mg take 15 cc by mouth every 4-6 hours prn pain dispense 500 cc bottle no refills       . Jevity 1.5 Cal (JEVITY 1.5) LIQD 237 mLs by PEG Tube route 4 (four) times daily. Change TF to 3 cans of Osmolite 1.5 and 4 cans of Jevity 1.5 via PEG daily as tolerated.  Give 1 can every 2-3 hours beginning with Jevity 1.5 and alternating with Osmolite 1.5.  Flush with 120 ml free water  before and after bolus feedings.  Please deliver TF today.      . lactulose (CHRONULAC) 10 GM/15ML solution Take 45 mLs (30 g total) by mouth every three (3) days as needed.  240 mL  0  . Misc Natural Products (PYCNOGENOL COMPLEX) TABS Take 1 tablet by mouth daily.        . Misc Natural Products (TURMERIC CURCUMIN) CAPS Take 1 capsule by mouth 2 (two) times daily.        . Misc Natural Products TABS Take 1 tablet by mouth daily.        . multivitamin (THERAGRAN) per tablet Take 1 tablet by mouth daily.        . Nutritional Supplements (FEEDING SUPPLEMENT, OSMOLITE 1.5 CAL,) LIQD 237 mLs by PEG Tube route 3 (three) times daily. Change TF to 3 cans of Osmolite 1.5 and 4 cans of Jevity 1.5 via PEG daily as tolerated.  Give 1 can every 2-3 hours beginning with Jevity 1.5 and alternating with Osmolite 1.5.  Flush with 120 ml free water before and after bolus feedings.  Please deliver TF today.        .  Omega-3 Fatty Acids (FISH OIL) 1000 MG CAPS Take 1 capsule by mouth 2 (two) times daily.        . ondansetron (ZOFRAN) 8 MG tablet Take 8 mg by mouth every 12 (twelve) hours as needed.        . pseudoephedrine (SUDAFED) 30 MG tablet Take 60 mg by mouth 2 (two) times daily as needed.        . Red Yeast Rice 600 MG CAPS Take 1 capsule by mouth daily.          PHYSICAL EXAMINATION:  Unchanged from before except for erythematous oropharynx.  LABS: CBC    Component Value Date/Time   WBC 7.2 06/13/2011 1052   RBC 4.81 06/13/2011 1052   HGB 11.8* 07/20/2011 1500   HGB 14.8 06/13/2011 1052   HCT 34.6* 07/20/2011 1500   HCT 43.2 06/13/2011 1052   PLT 176 07/20/2011 1500   PLT 226 06/13/2011 1052   MCV 93.4 07/20/2011 1500   MCV 89.8 06/13/2011 1052   MCH 30.8 06/13/2011 1052   MCHC 34.3 06/13/2011 1052   RDW 12.5 06/13/2011 1052   LYMPHSABS 1.3 04/26/2010 1530   MONOABS 0.7 04/26/2010 1530   EOSABS 0.0 07/20/2011 1500   EOSABS 0.1 04/26/2010 1530   BASOSABS 0.0 07/20/2011 1500   BASOSABS 0.0 04/26/2010 1530   BMET    Component Value Date/Time   NA 132* 07/20/2011 1500   K 5.0 07/20/2011 1500   CL 96 07/20/2011 1500   CO2 30 07/20/2011 1500   GLUCOSE 100* 07/20/2011 1500   BUN 28* 07/20/2011 1500   CREATININE 0.94 07/20/2011 1500   CALCIUM 9.5 07/20/2011 1500   GFRNONAA >60 04/18/2011 1046   GFRAA >60 04/18/2011 1046    ASSESSMENT AND PLAN: 1. Oropharynx cancer.  I discussed with Jason Noble that he is doing     relatively well on chemoradiation therapy.  He is having grade 1-2     fatigue, grade 2-3 mucositis, and grade 1-2 skin changes, and these     are all expected from therapy.  I advised him to proceed with his     cisplatin today as his lab tests have been within normal range.  I     will see him next week with potential visit for last dose of     chemotherapy  at that time. 2. Mucositis pain from chemoradiation therapy.  Advised to increase     fentanyl patch to 2 patches 12 mcg/hr and  reserve Vicodin for     breakthrough pain.  I advised him that 25 mg/hr is very low dose.     If he has oversedation, I need to decrease the dose.  However, 25     is acceptable because he is still requiring up to 6 times a day     breakthrough pain medication. 3. Constipation from pain medication.  Advised Jason Noble and his     wife to do Colace twice a day 100 mg p.o. b.i.d. and reserve     laxative such as MiraLax, milk of magnesia, magnesium citrate if he     has no BM once every 3 days.  I gave prescription for lactulose 20     mg once every 3 days if he has no bowel movements with over-the-     counter laxatives. 4.  Xerostomia and thick phlegm, I advised him to do upwards of 3-4 times a day dilute hydrogen peroxide, 1 part peroxide to 4 parts water, gargle and spit to clear his thick phlegm out. 5.  Grade 2 calorie-protein malnutrition: He has been doing less oral intake and depends more on PEG tube feedings for nutritional requirements. I advised him to do as much oral intake and swallowing exersize to decrease the risk of esophageal stricture.  6.  History of coronary artery disease.  Status post bypass long time ago and one episode of in-graft stenosis with stent placement.  Since 2008, he has been stable, no CHF.   He is on aspirin, statin and fish oil.  I defer to his cardiologist for management. 7. History of prostate cancer in 2002.  He is under care of his urologist and is reportedly in NED. 8.  History of smoking for 5 years and quit in 1967.   He is not smoking now.  ______________________________ Exie Parody, M.D. HTH/MEDQ  D:  07/23/2011  T:  07/23/2011  Job:  [id number]

## 2011-07-25 ENCOUNTER — Other Ambulatory Visit: Payer: Self-pay | Admitting: Oncology

## 2011-07-25 ENCOUNTER — Ambulatory Visit
Admission: RE | Admit: 2011-07-25 | Discharge: 2011-07-25 | Disposition: A | Discharge status: Home / Self Care | Payer: Medicare Other | Source: Ambulatory Visit | Attending: Radiation Oncology | Admitting: Radiation Oncology

## 2011-07-26 ENCOUNTER — Encounter (HOSPITAL_COMMUNITY): Payer: Self-pay | Admitting: *Deleted

## 2011-07-26 ENCOUNTER — Ambulatory Visit: Admission: RE | Admit: 2011-07-26 | Payer: Medicare Other | Source: Ambulatory Visit

## 2011-07-26 ENCOUNTER — Other Ambulatory Visit: Payer: Self-pay | Admitting: Oncology

## 2011-07-26 ENCOUNTER — Ambulatory Visit: Payer: Self-pay | Admitting: *Deleted

## 2011-07-26 ENCOUNTER — Telehealth: Payer: Self-pay | Admitting: *Deleted

## 2011-07-26 ENCOUNTER — Telehealth: Payer: Self-pay | Admitting: Radiation Oncology

## 2011-07-26 ENCOUNTER — Inpatient Hospital Stay (HOSPITAL_COMMUNITY)
Admission: AD | Admit: 2011-07-26 | Discharge: 2011-07-31 | DRG: 154 | Disposition: A | Discharge status: Home / Self Care | Payer: Medicare Other | Source: Ambulatory Visit | Attending: Oncology | Admitting: Oncology

## 2011-07-26 DIAGNOSIS — R5383 Other fatigue: Secondary | ICD-10-CM

## 2011-07-26 DIAGNOSIS — Z931 Gastrostomy status: Secondary | ICD-10-CM

## 2011-07-26 DIAGNOSIS — L538 Other specified erythematous conditions: Secondary | ICD-10-CM | POA: Diagnosis present

## 2011-07-26 DIAGNOSIS — Z8673 Personal history of transient ischemic attack (TIA), and cerebral infarction without residual deficits: Secondary | ICD-10-CM

## 2011-07-26 DIAGNOSIS — R5381 Other malaise: Secondary | ICD-10-CM | POA: Diagnosis present

## 2011-07-26 DIAGNOSIS — C109 Malignant neoplasm of oropharynx, unspecified: Secondary | ICD-10-CM | POA: Diagnosis present

## 2011-07-26 DIAGNOSIS — E440 Moderate protein-calorie malnutrition: Secondary | ICD-10-CM | POA: Diagnosis present

## 2011-07-26 DIAGNOSIS — R238 Other skin changes: Secondary | ICD-10-CM | POA: Diagnosis present

## 2011-07-26 DIAGNOSIS — D61810 Antineoplastic chemotherapy induced pancytopenia: Secondary | ICD-10-CM | POA: Diagnosis present

## 2011-07-26 DIAGNOSIS — K117 Disturbances of salivary secretion: Principal | ICD-10-CM | POA: Diagnosis present

## 2011-07-26 DIAGNOSIS — K121 Other forms of stomatitis: Secondary | ICD-10-CM | POA: Diagnosis present

## 2011-07-26 DIAGNOSIS — R112 Nausea with vomiting, unspecified: Secondary | ICD-10-CM | POA: Diagnosis present

## 2011-07-26 DIAGNOSIS — R1115 Cyclical vomiting syndrome unrelated to migraine: Secondary | ICD-10-CM | POA: Diagnosis present

## 2011-07-26 DIAGNOSIS — T451X5A Adverse effect of antineoplastic and immunosuppressive drugs, initial encounter: Secondary | ICD-10-CM | POA: Diagnosis present

## 2011-07-26 HISTORY — DX: Other fatigue: R53.83

## 2011-07-26 HISTORY — DX: Acute myocardial infarction, unspecified: I21.9

## 2011-07-26 LAB — CBC
HCT: 31.7 % — ABNORMAL LOW (ref 39.0–52.0)
Hemoglobin: 11 g/dL — ABNORMAL LOW (ref 13.0–17.0)
MCH: 31.9 pg (ref 26.0–34.0)
MCHC: 34.7 g/dL (ref 30.0–36.0)
MCV: 91.9 fL (ref 78.0–100.0)
Platelets: 170 10*3/uL (ref 150–400)
RBC: 3.45 MIL/uL — ABNORMAL LOW (ref 4.22–5.81)
RDW: 15.1 % (ref 11.5–15.5)
WBC: 4 10*3/uL (ref 4.0–10.5)

## 2011-07-26 LAB — CREATININE, SERUM
Creatinine, Ser: 0.94 mg/dL (ref 0.50–1.35)
GFR calc Af Amer: >90 mL/min (ref >90)
GFR calc non Af Amer: 83 mL/min — ABNORMAL LOW (ref >90)

## 2011-07-26 MED ORDER — ATORVASTATIN CALCIUM 40 MG PO TABS
40.0000 mg | ORAL_TABLET | Freq: Every day | ORAL | Status: DC
  Administered 2011-07-26 – 2011-07-28 (×3): 40 mg
  Filled 2011-07-26 (×5): qty 1

## 2011-07-26 MED ORDER — ACETAMINOPHEN 325 MG PO TABS: 650.0000 mg | ORAL_TABLET | ORAL | Status: DC | PRN

## 2011-07-26 MED ORDER — ACETAMINOPHEN 650 MG/20.3ML PO SUSP: 650.0000 mg | ORAL | Status: DC | PRN

## 2011-07-26 MED ORDER — HYDROCORTISONE 2.5 % RE CREA: 1.0000 "application " | TOPICAL_CREAM | Freq: Two times a day (BID) | RECTAL | Status: DC | PRN

## 2011-07-26 MED ORDER — SALINE SPRAY 0.65 % NA SOLN
1.0000 | NASAL | Status: DC | PRN
  Administered 2011-07-27: 1 via NASAL
  Filled 2011-07-26: qty 44

## 2011-07-26 MED ORDER — CHLORHEXIDINE GLUCONATE 0.12 % MT SOLN
15.0000 mL | Freq: Two times a day (BID) | OROMUCOSAL | Status: DC
  Administered 2011-07-27 – 2011-07-31 (×8): 15 mL via OROMUCOSAL
  Filled 2011-07-26 (×11): qty 15

## 2011-07-26 MED ORDER — ZOLPIDEM TARTRATE 5 MG PO TABS
5.0000 mg | ORAL_TABLET | Freq: Every evening | ORAL | Status: DC | PRN
  Filled 2011-07-26: qty 1

## 2011-07-26 MED ORDER — ALUM & MAG HYDROXIDE-SIMETH 200-200-20 MG/5ML PO SUSP: 60.0000 mL | ORAL | Status: DC | PRN

## 2011-07-26 MED ORDER — MORPHINE SULFATE 4 MG/ML IJ SOLN
4.0000 mg | INTRAMUSCULAR | Status: DC | PRN
  Administered 2011-07-26 (×2): 4 mg via INTRAVENOUS
  Filled 2011-07-26 (×2): qty 1

## 2011-07-26 MED ORDER — ASPIRIN EC 325 MG PO TBEC
325.0000 mg | DELAYED_RELEASE_TABLET | Freq: Every day | ORAL | Status: DC
  Administered 2011-07-26: 325 mg via ORAL
  Filled 2011-07-26 (×4): qty 1

## 2011-07-26 MED ORDER — PROCHLORPERAZINE MALEATE 10 MG PO TABS
10.0000 mg | ORAL_TABLET | Freq: Four times a day (QID) | ORAL | Status: DC | PRN
  Filled 2011-07-26: qty 1

## 2011-07-26 MED ORDER — ONDANSETRON 8 MG/NS 50 ML IVPB
8.0000 mg | Freq: Three times a day (TID) | INTRAVENOUS | Status: DC | PRN
  Filled 2011-07-26: qty 8

## 2011-07-26 MED ORDER — JEVITY 1.5 CAL PO LIQD: ORAL | Status: DC

## 2011-07-26 MED ORDER — ENOXAPARIN SODIUM 40 MG/0.4ML ~~LOC~~ SOLN
40.0000 mg | SUBCUTANEOUS | Status: DC
  Administered 2011-07-26 – 2011-07-30 (×5): 40 mg via SUBCUTANEOUS
  Filled 2011-07-26 (×6): qty 0.4

## 2011-07-26 MED ORDER — GUAIFENESIN-DM 100-10 MG/5ML PO SYRP: 10.0000 mL | ORAL_SOLUTION | ORAL | Status: DC | PRN

## 2011-07-26 MED ORDER — ONDANSETRON HCL 4 MG PO TABS: 4.0000 mg | ORAL_TABLET | Freq: Three times a day (TID) | ORAL | Status: DC | PRN

## 2011-07-26 MED ORDER — ONDANSETRON HCL 4 MG/2ML IJ SOLN
4.0000 mg | Freq: Three times a day (TID) | INTRAMUSCULAR | Status: DC | PRN
  Administered 2011-07-26 – 2011-07-31 (×10): 4 mg via INTRAVENOUS
  Filled 2011-07-26 (×10): qty 2

## 2011-07-26 MED ORDER — HYDROGEN PEROXIDE 3 % EX SOLN
Freq: Four times a day (QID) | CUTANEOUS | Status: DC
  Administered 2011-07-27 – 2011-07-30 (×13): via TOPICAL
  Filled 2011-07-26 (×3): qty 473

## 2011-07-26 MED ORDER — OSMOLITE 1.5 CAL PO LIQD
1000.0000 mL | ORAL | Status: DC
  Administered 2011-07-26: 1000 mL
  Filled 2011-07-26 (×4): qty 1000

## 2011-07-26 MED ORDER — BIOTENE DRY MOUTH MT LIQD
15.0000 mL | Freq: Two times a day (BID) | OROMUCOSAL | Status: DC
  Administered 2011-07-26 – 2011-07-30 (×9): 15 mL via OROMUCOSAL

## 2011-07-26 MED ORDER — PRUTECT EX EMUL
Freq: Two times a day (BID) | CUTANEOUS | Status: DC
  Administered 2011-07-26: 23:00:00 via TOPICAL
  Administered 2011-07-27: 1 via TOPICAL
  Administered 2011-07-28 – 2011-07-29 (×3): via TOPICAL
  Administered 2011-07-29: 1 via TOPICAL
  Administered 2011-07-30 (×2): via TOPICAL
  Filled 2011-07-26 (×5): qty 45

## 2011-07-26 MED ORDER — ROSUVASTATIN CALCIUM 20 MG PO TABS
20.0000 mg | ORAL_TABLET | ORAL | Status: DC
  Filled 2011-07-26: qty 1

## 2011-07-26 MED ORDER — METOCLOPRAMIDE HCL 5 MG/ML IJ SOLN
10.0000 mg | Freq: Four times a day (QID) | INTRAMUSCULAR | Status: DC | PRN
  Administered 2011-07-27 – 2011-07-31 (×2): 10 mg via INTRAVENOUS
  Filled 2011-07-26 (×3): qty 2

## 2011-07-26 MED ORDER — SENNOSIDES-DOCUSATE SODIUM 8.6-50 MG PO TABS
2.0000 | ORAL_TABLET | Freq: Two times a day (BID) | ORAL | Status: DC | PRN
  Filled 2011-07-26: qty 2

## 2011-07-26 MED ORDER — ONDANSETRON 4 MG PO TBDP
4.0000 mg | ORAL_TABLET | Freq: Three times a day (TID) | ORAL | Status: DC | PRN
  Filled 2011-07-26: qty 2

## 2011-07-26 MED ORDER — PROCHLORPERAZINE MALEATE 10 MG PO TABS: 10.0000 mg | ORAL_TABLET | Freq: Four times a day (QID) | ORAL | Status: DC | PRN

## 2011-07-26 MED ORDER — OSMOLITE 1.5 CAL PO LIQD: ORAL | Status: DC

## 2011-07-26 MED ORDER — SODIUM CHLORIDE 0.9 % IV SOLN
INTRAVENOUS | Status: DC
Start: 2011-07-26 — End: 2011-07-30
  Administered 2011-07-26 – 2011-07-28 (×4): via INTRAVENOUS
  Administered 2011-07-28: 1000 mL via INTRAVENOUS
  Administered 2011-07-28: 02:00:00 via INTRAVENOUS
  Administered 2011-07-29: 1000 mL via INTRAVENOUS
  Administered 2011-07-29 – 2011-07-30 (×3): via INTRAVENOUS

## 2011-07-26 MED ORDER — MORPHINE SULFATE 10 MG/ML IJ SOLN
6.0000 mg | INTRAMUSCULAR | Status: DC | PRN
  Administered 2011-07-26 – 2011-07-27 (×2): 2 mg via INTRAVENOUS
  Administered 2011-07-27: 05:00:00 via INTRAVENOUS
  Administered 2011-07-27: 2 mg via INTRAVENOUS
  Filled 2011-07-26 (×4): qty 1

## 2011-07-26 MED ORDER — PROMETHAZINE HCL 25 MG RE SUPP: 25.0000 mg | Freq: Every day | RECTAL | Status: DC | PRN

## 2011-07-26 MED ORDER — ATORVASTATIN CALCIUM 40 MG PO TABS
40.0000 mg | ORAL_TABLET | Freq: Every day | ORAL | Status: DC
  Filled 2011-07-26: qty 1

## 2011-07-26 MED ORDER — SENNOSIDES-DOCUSATE SODIUM 8.6-50 MG PO TABS: 2.0000 | ORAL_TABLET | Freq: Two times a day (BID) | ORAL | Status: DC | PRN

## 2011-07-26 NOTE — H&P (Signed)
Jason Noble is an 70 y.o. male.   Chief Complaint: Intractable nausea vomiting,  severe fatigue, and skin burn of the neck.  HPI:  He was diagnosed with HPV positive oropharynx was NUMBER and started on concurrent chemotherapy and radiation therapy with once a week cis-platinum started on 06/18/2011. His last dose of cis-platinum was on Monday, 07/23/2011. Since that since he is been having progressively worsening nausea vomiting. He has been taking all prescribed antiemetics including Compazine Zofran Ativan and ferritin Phenergan without relief.   The nausea and vomiting is described as severe. Today he has had about 12 episodes of nausea vomiting. Nausea vomiting is always after an attempt at using PEG tube. However he also has nausea vomiting not associated with feeding.  He reports severe fatigue. He has not been able to do much around the house He has been mainly bedbound.  He feels hopelessness and depressed however denies suicidal muscle radiation.  With further chemoradiation therapy has also been having worsening desquamation of the skin of his neck. He does not have any prone discharge of the neck and upper does have tender on palpation. He has had severe dysphagia, xerostomia, and gagging sensation despite numerous attempts at mouth rinses with hydrogen peroxide.  He reports feeling chill and cold however no Frank temperature. He denies headache, confusion, tinnitus, hearing loss, breath or cough, shortness of breath, abdominal pain, P2 pain or discharge, melena, hematochezia, hematuria, low back pain, low leg weakness, bowel or bladder incontinence.       Past Medical History  Diagnosis Date  . Cholecystitis   . Abscess of gallbladder   . CVA (cerebral infarction) 2007  . 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 Skin cancer     basal cell, squamous cell  . Prostate cancer 2002  . Basal cell carcinoma of skin   . Squamous cell carcinoma of skin   . Myocardial infarction    approx. 2--8  . Coronary heart disease     has a stent   Past Surgical History  Procedure Date  . Prostatectomy   . Peg tube placement 06/13/11 at [facility]  . Coronary artery bypass graft 1991  . Cholecystectomy [id number]    3 surgeries involved, laparotomy  . Tonsillectomy   . Wrist surgery approx. 39 yrs ago    removal left radial head  . Multiple tooth extractions     prior to radiation   Family History  Problem Relation Age of Onset  . Stroke Mother   . Coronary artery disease Brother     had bypass   Social History:  reports that he quit smoking about 45 years ago. He has never used smokeless tobacco. He reports that he does not drink alcohol or use illicit drugs.  Allergies: No Known Allergies Medications Prior to Admission  Medication Dose Route Frequency Provider Last Rate Last Dose  . 0.9 %  sodium chloride infusion   Intravenous Continuous Jethro Bolus, MD 125 mL/hr at 07/26/11 1600    . acetaminophen (TYLENOL) tablet 650 mg  650 mg Oral Q4H PRN Jethro Bolus, MD      . alum & mag hydroxide-simeth (MAALOX/MYLANTA) 200-200-20 MG/5ML suspension 60 mL  60 mL Oral Q4H PRN Jethro Bolus, MD      . aspirin EC tablet 325 mg  325 mg Oral Daily Jethro Bolus, MD   325 mg at 07/26/11 1855  . atorvastatin (LIPITOR) tablet 40 mg  40 mg Oral q1800 Jethro Bolus, MD      .  enoxaparin (LOVENOX) injection 40 mg  40 mg Subcutaneous Q24H Jethro Bolus, MD   40 mg at 07/26/11 1856  . feeding supplement (OSMOLITE 1.5 CAL) liquid 1,000 mL  1,000 mL Per Tube Continuous Jethro Bolus, MD 60 mL/hr at 07/26/11 1815 1,000 mL at 07/26/11 1815  . guaiFENesin-dextromethorphan (ROBITUSSIN DM) 100-10 MG/5ML syrup 10 mL  10 mL Oral Q4H PRN Jethro Bolus, MD      . hydrocortisone (ANUSOL-HC) 2.5 % rectal cream 1 application  1 application Rectal BID PRN Jethro Bolus, MD      . hydrogen peroxide 3 % external solution   Topical QID Jethro Bolus, MD      . metoCLOPramide (REGLAN) injection 10 mg  10 mg Intravenous Q6H PRN Jethro Bolus, MD      . morphine injection 6  mg  6 mg Intravenous Q3H PRN Jethro Bolus, MD      . ondansetron (ZOFRAN) tablet 4-8 mg  4-8 mg Oral Q8H PRN Jethro Bolus, MD       Or  . ondansetron (ZOFRAN-ODT) disintegrating tablet 4-8 mg  4-8 mg Oral Q8H PRN Jethro Bolus, MD       Or  . ondansetron (ZOFRAN) injection 4 mg  4 mg Intravenous Q8H PRN Jethro Bolus, MD   4 mg at 07/26/11 1530   Or  . ondansetron (ZOFRAN) 8 mg/NS 50 ml IVPB  8 mg Intravenous Q8H PRN Jethro Bolus, MD      . prochlorperazine (COMPAZINE) tablet 10 mg  10 mg Oral Q6H PRN Jethro Bolus, MD      . promethazine (PHENERGAN) suppository 25 mg  25 mg Rectal Daily PRN Jethro Bolus, MD      . PRUTECT emulsion   Topical BID Victorino December, MD      . senna-docusate (Senokot-S) tablet 2 tablet  2 tablet Oral BID PRN Jethro Bolus, MD      . DISCONTD: morphine 4 MG/ML injection 4 mg  4 mg Intravenous Q4H PRN Jethro Bolus, MD   4 mg at 07/26/11 1804  . DISCONTD: rosuvastatin (CRESTOR) tablet 20 mg  20 mg Oral Q24H Jethro Bolus, MD       Medications Prior to Admission  Medication Sig Dispense Refill  . aspirin EC 325 MG EC tablet Take 325 mg by mouth daily.        Marland Kitchen atorvastatin (LIPITOR) 40 MG tablet Take 40 mg by mouth daily.        Marland Kitchen HYDROcodone-acetaminophen (LORTAB) 7.5-500 MG/15ML solution Take 15 mLs by mouth every 6 (six) hours as needed. lortab elixir 7.5/500 mg take 15 cc by mouth every 4-6 hours prn pain dispense 500 cc bottle no refills      . lactulose (CHRONULAC) 10 GM/15ML solution Take 45 mLs (30 g total) by mouth every three (3) days as needed.  240 mL  0  . LORazepam (ATIVAN) 0.5 MG tablet Take 0.5 mg by mouth every 6 (six) hours as needed.       . ondansetron (ZOFRAN) 8 MG tablet Take 8 mg by mouth every 12 (twelve) hours as needed.       . prochlorperazine (COMPAZINE) 10 MG tablet Take 10 mg by mouth every 6 (six) hours as needed.       . promethazine (PHENERGAN) 25 MG suppository INSERT 1 SUPPOSITORY RECTALLY EVERY 6 HOURS AS NEEDED FOR NAUSEA  12 suppository  0  . DISCONTD: Jevity 1.5 Cal (JEVITY 1.5)  LIQD 237 mLs by PEG Tube route 4 (four) times daily.  Change TF to 3 cans of Osmolite 1.5 and 4 cans of Jevity 1.5 via PEG daily as tolerated.  Give 1 can every 2-3 hours beginning with Jevity 1.5 and alternating with Osmolite 1.5.  Flush with 120 ml free water before and after bolus feedings.  Please deliver TF today.      Marland Kitchen DISCONTD: Nutritional Supplements (FEEDING SUPPLEMENT, OSMOLITE 1.5 CAL,) LIQD 237 mLs by PEG Tube route 3 (three) times daily. Change TF to 3 cans of Osmolite 1.5 and 4 cans of Jevity 1.5 via PEG daily as tolerated.  Give 1 can every 2-3 hours beginning with Jevity 1.5 and alternating with Osmolite 1.5.  Flush with 120 ml free water before and after bolus feedings.  Please deliver TF today.         Results for orders placed during the hospital encounter of 07/26/11 (from the past 48 hour(s))  CBC     Status: Abnormal   Collection Time   07/26/11  4:32 PM      Component Value Range Comment   WBC 4.0  4.0 - 10.5 (K/uL)    RBC 3.45 (*) 4.22 - 5.81 (MIL/uL)    Hemoglobin 11.0 (*) 13.0 - 17.0 (g/dL)    HCT 16.1 (*) 09.6 - 52.0 (%)    MCV 91.9  78.0 - 100.0 (fL)    MCH 31.9  26.0 - 34.0 (pg)    MCHC 34.7  30.0 - 36.0 (g/dL)    RDW 04.5  40.9 - 81.1 (%)    Platelets 170  150 - 400 (K/uL)   CREATININE, SERUM     Status: Abnormal   Collection Time   07/26/11  4:32 PM      Component Value Range Comment   Creatinine, Ser 0.94  0.50 - 1.35 (mg/dL)    GFR calc non Af Amer 83 (*) >90 (mL/min)    GFR calc Af Amer >90  >90 (mL/min)     Pertinent items are noted in HPI.   Blood pressure 169/95, pulse 67, temperature 98.9 F (37.2 C), temperature source Oral, resp. rate 16, height 5\' 9"  (1.753 m), weight 177 lb 7.5 oz (80.5 kg), SpO2 96.00%.  ECOG 2. PHYSICAL EXAM:   General:  Thin-appearing man shivering.  Eyes:  no scleral icterus.  ENT:  There were no oropharyngeal lesions.  Neck was without thyromegaly.  The skin of the neck showedsign of desquamation and tender to touch but  without purulent discharge.  Lymph Node Survey:  His right cervical lymph node level II is now about 1x1cm now.  Respiratory: lungs were clear bilaterally without wheezing or crackles.  Cardiovascular:  Regular rate and rhythm, S1/S2, without murmur, rub or gallop.  There was no pedal edema.  GI:  abdomen was soft, flat, nontender, nondistended, without organomegaly.  PEG tube was dry, clean, and intact.Muscoloskeletal:  no spinal tenderness of palpation of vertebral spine.  Skin exam was without echymosis, petichae.  Neuro exam was nonfocal.  Patient was able to get on and off exam table without assistance.  Gait was wobbly.  Patient was alerted and oriented.  Attention was good.   Language was appropriate.  Mood was depressed but he denied suicidal/homocidal ideation.  Speech was not pressured.  Thought content was not tangential.     Assessment/Plan  1.  nausea vomiting: This is mostly secondary to chemoradiotherapy. He is moving his bowels. Examination does not show evidence of bowel obstruction as he had normal active  bowel sounds and no increased tympany.  There is low concern for a intracranial met with his disease. Given the timing at the end of the course of chemoradaition therapy this is most likely treatment related.  Since I have low concern for obstruction, therefore abdominal KUB is not  indicated. However if despite conservative management and he has worsening symptoms I will consider further workup.  Plan is for normal saline IV fluids and discontinuation of oral intake. I would try antiemetics using Compazine Phenergan Ativan and and on Reglan for intractable nausea vomiting.  If these measures do not improve his symptoms tomorrow, I may consider adding on dexamethasone for delayed nausea vomiting.  2.  oropharynx squamous carcinoma: Mr. Phineas Douglas and his wife are adamantly against radiation therapy for the next few days. I discussed with them that holding radiation therapy in this kind of  cancer may decrease the chance of response.  However he also has grade 3 fatigue and grade 3 skin changes. I  recommended him to talk with Dr. Dorothy Puffer whom I called personally and appreciate his involvement of the patient during this hospital course.  Given his side effects, he wanted to discontinue all further chemotherapy. He only has one more session of chemotherapy on Monday, 07/30/2011 which will be discontinued.   3.  Fatigue:  This is grade 3 and is a typical reaction in the treatment of oropharynx squamous cell carcinoma.  I encouraged him and his wife that with holding therapy for the next few days hopefully his symptoms will improve. If it doesn't I may consider physical and occupational therapy consultations.  4.  xerostomia/dysphasia/mucositis: I discussed with Mr. Peschel and his wife that these are typical side effects of therapy again. I recommended him to be consistent with oral care using diluted hydrogen peroxide solution.  I will also order biotin oral care to maintain his dental health.  5.  pain control for #4:  I recommend discontinued the hydrocodone/Tylenol solution as his pain is at the level where that pain med is not very effective.  I recommended using intravenous morphine sulfate. If he requires very frequent pain medications more than once every 3 hours, I may consider starting morphine sulfate PCA.  6.  calorie protein malnutrition: This is moderate: This is due to mucositis that is severe. As he could not tolerate bolus tube feeding via the PEG tube I recommended starting continuous PEG tube feeding using a pump.  I requested additional consultation for system. However I started him mouth with Osmolite 1.5 at 40 mL per hour with advancing by 10 cc per hour q shift with goal 60 cc an hour at this time.  7.  history of CVA and CAD: Not active at this time  FULL CODE Lovenox for DVT prophy.   HA,HUAN 07/26/2011, 8:30 PM

## 2011-07-26 NOTE — Telephone Encounter (Signed)
Phoned this patient's cell phone and spoke with his wife. Jason Noble, patient's wife, reports he was admitted by Dr. Gaylyn Rong to [facility] room 1344. Instructed Jason Noble this Clinical research associate would check on them again tomorrow morning. Informed Dr. Mitzi Hansen of these findings. Dr. Mitzi Hansen confirmed he and Dr. Gaylyn Rong had spoken. Informed Dr. Mitzi Hansen the patient wished to be seen by him tonight. Informed therapist, Dell Ponto, on TOMO that the patient would not be treated today and this writer will check the patient's status tomorrow morning.

## 2011-07-26 NOTE — Telephone Encounter (Signed)
PT.'S WIFE STATES THE FOUR MEDICATIONS PT. HAS IS NOT WORKING.

## 2011-07-26 NOTE — Progress Notes (Deleted)
I received a phone call from Dr. Ha's nurse informing me that Mr. Suydam has had lots of nausea and vomiting over the last 12 hours and that he is no longer tolerating feedings.  Patient's wife is distraught and concerned that the patient has increased pain and the pain medication is not working,  his nausea medication is not working and she cannot get him to tolerate any nutrition at this point. Dr. Ha is requesting continuous feedings.  Therefore I have written tube feeding orders to continue the same combination of Jevity 1.5 and Osmolite 1.5, however to begin continuous tube feeding at 40 mL an hour and increase q.4 hours to goal at 70 mL an hour as tolerated.  I have also asked for [facility] to deliver a pump to the patient.  In the meantime, I have called Mrs. Baccam and she reports that at this point she believes that the patient will be admitted to the hospital, so she is not sure that she is going to need the feeding pump at this time. However, I have explained to her that we will just play it by ear and we will allow the patient to choose his feeding method as tolerated once his pain and nausea are controlled.  I will be in contact with the inpatient dietitian to communicate the patient's history and monitor his tolerance of feeding.  I will follow up with the patient after discharge.    ______________________________ Barbara Neff, RD, LDN Clinical Nutrition Specialist BN/MEDQ  D:  07/26/2011  T:  07/26/2011  Job:  [id number] 

## 2011-07-26 NOTE — Telephone Encounter (Signed)
Return called to [place], patient's wife. Olegario Messier reports this patient will not be in for treatment today since he is very weak and has "hardly slept because of nausea and frequent vomiting." Encouraged Olegario Messier to bring her husband in to see Dr. Mitzi Hansen because he may be dehydrated. Olegario Messier reports she is awaiting a return call from Dr. Gaylyn Rong because she thinks he may need to be admitted for dehydration. Instructed Olegario Messier this Clinical research associate would phone her back later this afternoon to check on everyone. Informed Dr. Mitzi Hansen of these findings.

## 2011-07-26 NOTE — Telephone Encounter (Signed)
PT.'S WIFE CALLED. HER HUSBAND HAD CHEMO ON 07/23/11. HE HAS HAD NAUSEA AND VOMITING "ALL DAY YESTERDAY AND ALL LAST NIGHT". AT LEAST 12 TIMES IN THE LAST 24 HOURS. PHONE [phone].

## 2011-07-27 ENCOUNTER — Ambulatory Visit
Admission: RE | Admit: 2011-07-27 | Discharge: 2011-07-27 | Disposition: A | Discharge status: Home / Self Care | Payer: Medicare Other | Source: Ambulatory Visit | Attending: Radiation Oncology | Admitting: Radiation Oncology

## 2011-07-27 ENCOUNTER — Other Ambulatory Visit: Payer: Self-pay | Admitting: Oncology

## 2011-07-27 ENCOUNTER — Other Ambulatory Visit: Payer: Self-pay | Admitting: Lab

## 2011-07-27 ENCOUNTER — Inpatient Hospital Stay (HOSPITAL_COMMUNITY): Payer: Medicare Other

## 2011-07-27 DIAGNOSIS — C01 Malignant neoplasm of base of tongue: Secondary | ICD-10-CM | POA: Diagnosis present

## 2011-07-27 DIAGNOSIS — R071 Chest pain on breathing: Secondary | ICD-10-CM

## 2011-07-27 LAB — BASIC METABOLIC PANEL
BUN: 20 mg/dL (ref 6–23)
CO2: 27 mEq/L (ref 19–32)
Calcium: 8.7 mg/dL (ref 8.4–10.5)
Chloride: 101 mEq/L (ref 96–112)
Creatinine, Ser: 0.9 mg/dL (ref 0.50–1.35)
GFR calc Af Amer: >90 mL/min (ref >90)
GFR calc non Af Amer: 84 mL/min — ABNORMAL LOW (ref >90)
Glucose, Bld: 128 mg/dL — ABNORMAL HIGH (ref 70–99)
Potassium: 4.3 mEq/L (ref 3.5–5.1)
Sodium: 134 mEq/L — ABNORMAL LOW (ref 135–145)

## 2011-07-27 MED ORDER — ZOLPIDEM TARTRATE 5 MG PO TABS
5.0000 mg | ORAL_TABLET | Freq: Every evening | ORAL | Status: DC | PRN
  Administered 2011-07-30: 5 mg
  Filled 2011-07-27 (×2): qty 1

## 2011-07-27 MED ORDER — JEVITY 1.5 CAL PO LIQD
1000.0000 mL | ORAL | Status: DC
  Administered 2011-07-27: 16:00:00
  Administered 2011-07-29 – 2011-07-30 (×2): 1000 mL
  Filled 2011-07-27 (×4): qty 1000

## 2011-07-27 MED ORDER — NON FORMULARY: 1000.0000 mL | Status: DC

## 2011-07-27 MED ORDER — JEVITY 1.5 CAL PO LIQD
1000.0000 mL | ORAL | Status: DC
  Filled 2011-07-27 (×2): qty 1000

## 2011-07-27 MED ORDER — MORPHINE SULFATE 2 MG/ML IJ SOLN
2.0000 mg | INTRAMUSCULAR | Status: DC | PRN
  Administered 2011-07-27 – 2011-07-30 (×8): 2 mg via INTRAVENOUS
  Filled 2011-07-27 (×10): qty 1

## 2011-07-27 MED ORDER — BIAFINE EX EMUL
Freq: Two times a day (BID) | CUTANEOUS | Status: DC
  Administered 2011-07-27: 11:00:00 via TOPICAL

## 2011-07-27 MED ORDER — FENTANYL 25 MCG/HR TD PT72
25.0000 ug | MEDICATED_PATCH | TRANSDERMAL | Status: DC
  Administered 2011-07-27 – 2011-07-30 (×2): 25 ug via TRANSDERMAL
  Filled 2011-07-27 (×3): qty 1

## 2011-07-27 MED ORDER — JEVITY 1.5 CAL PO LIQD
1000.0000 mL | ORAL | Status: DC
  Filled 2011-07-27: qty 1000

## 2011-07-27 MED ORDER — OSMOLITE 1.5 CAL PO LIQD
1000.0000 mL | ORAL | Status: DC
  Administered 2011-07-28 – 2011-07-29 (×2): 1000 mL
  Filled 2011-07-27 (×2): qty 1000

## 2011-07-27 NOTE — Progress Notes (Deleted)
The patient reports that he has had some issues with constipation.  He continues to tolerate 7 cans of Jevity 1.5 daily to provide 2485 calories, 106 grams of protein and 2520 mL of free water.  His weight is stable at 181.8 pounds.  He reports increased vomiting for which the physician has prescribed increased Ativan and increased peroxide rinses. The patient would like to change the tube feedings to provide a bit less fiber.  NUTRITION DIAGNOSIS:  Inadequate oral intake continues.  INTERVENTION:  We will change tube feedings to 4 cans of Jevity 1.5 plus 3 cans of Osmolite 1.5 to total 7 cans of formula daily via PEG.  The patient will begin with one can of Jevity 1.5 via his PEG with 120 mL of free water before and after each bolus.  He will alternate Jevity 1.5 with Osmolite 1.5  throughout the day every 2-3 hours to give him a total of 7 cans daily.  He will continue to eat and drink as he can.  MONITORY EVALUATION AND GOALS:  The patient will tolerate tube feeding changes to promote regular bowel movements.  NEXT VISIT:  Monday, November 12th.    ______________________________ Barbara Neff, RD, LDN Clinical Nutrition Specialist BN/MEDQ  D:  07/23/2011  T:  07/23/2011  Job:  [id number] 

## 2011-07-27 NOTE — Progress Notes (Signed)
[facility] Radiation Oncology Weekly Treatment Note    Name: Jason Noble Date: 07/27/2011 MRN: [id number] DOB: Oct 28, 1940  Status:inpatient    Current dose: 5936  Current fraction:28  Planned dose:6996  Planned fraction:33   MEDICATIONS: No current facility-administered medications for this encounter. No current outpatient prescriptions on file. Facility-Administered Medications Ordered in Other Encounters: 0.9 %  sodium chloride infusion, , Intravenous, Continuous, Jethro Bolus, MD, Last Rate: 125 mL/hr at 07/27/11 1737;  Acetaminophen SUSP 650 mg, 650 mg, Per Tube, Q4H PRN, Victorino December, MD;  alum & mag hydroxide-simeth (MAALOX/MYLANTA) 200-200-20 MG/5ML suspension 60 mL, 60 mL, Per Tube, Q4H PRN, Victorino December, MD antiseptic oral rinse (BIOTENE) solution 15 mL, 15 mL, Mouth Rinse, q12n4p, Jethro Bolus, MD, 15 mL at 07/26/11 2145;  aspirin EC tablet 325 mg, 325 mg, Oral, Daily, Jethro Bolus, MD, 325 mg at 07/26/11 1855;  atorvastatin (LIPITOR) tablet 40 mg, 40 mg, Per Tube, q1800, Victorino December, MD, 40 mg at 07/26/11 2237;  chlorhexidine (PERIDEX) 0.12 % solution 15 mL, 15 mL, Mouth Rinse, BID, Jethro Bolus, MD, 15 mL at 07/27/11 0834 enoxaparin (LOVENOX) injection 40 mg, 40 mg, Subcutaneous, Q24H, Jethro Bolus, MD, 40 mg at 07/27/11 1605;  feeding supplement (OSMOLITE 1.5 CAL) liquid 1,000 mL, 1,000 mL, Per Tube, Continuous, Jethro Bolus, MD;  fentaNYL (DURAGESIC - dosed mcg/hr) patch 25 mcg, 25 mcg, Transdermal, Q72H, Jethro Bolus, MD, 25 mcg at 07/27/11 1111;  guaiFENesin-dextromethorphan (ROBITUSSIN DM) 100-10 MG/5ML syrup 10 mL, 10 mL, Per Tube, Q4H PRN, Victorino December, MD hydrocortisone (ANUSOL-HC) 2.5 % rectal cream 1 application, 1 application, Rectal, BID PRN, Jethro Bolus, MD;  hydrogen peroxide 3 % external solution, , Topical, QID, Jethro Bolus, MD;  Jevity 1.5 Cal (JEVITY 1.5) liquid 1,000 mL, 1,000 mL, Per Tube, Continuous, Jethro Bolus, MD, Last Rate: 60 mL/hr at 07/27/11 1558;  metoCLOPramide  (REGLAN) injection 10 mg, 10 mg, Intravenous, Q6H PRN, Jethro Bolus, MD, 10 mg at 07/27/11 1602 morphine 2 MG/ML injection 2 mg, 2 mg, Intravenous, Q2H PRN, Jethro Bolus, MD, 2 mg at 07/27/11 1605;  ondansetron (ZOFRAN) 8 mg/NS 50 ml IVPB, 8 mg, Intravenous, Q8H PRN, Jethro Bolus, MD;  ondansetron (ZOFRAN) injection 4 mg, 4 mg, Intravenous, Q8H PRN, Jethro Bolus, MD, 4 mg at 07/27/11 1110;  ondansetron (ZOFRAN) tablet 4-8 mg, 4-8 mg, Oral, Q8H PRN, Jethro Bolus, MD;  ondansetron (ZOFRAN-ODT) disintegrating tablet 4-8 mg, 4-8 mg, Oral, Q8H PRN, Jethro Bolus, MD prochlorperazine (COMPAZINE) tablet 10 mg, 10 mg, Per Tube, Q6H PRN, Victorino December, MD;  promethazine (PHENERGAN) suppository 25 mg, 25 mg, Rectal, Daily PRN, Jethro Bolus, MD;  PRUTECT emulsion, , Topical, BID, Victorino December, MD;  senna-docusate (Senokot-S) tablet 2 tablet, 2 tablet, Per Tube, BID PRN, Victorino December, MD;  sodium chloride (OCEAN) 0.65 % nasal spray 1 spray, 1 spray, Each Nare, PRN, Victorino December, MD, 1 spray at 07/27/11 0300 topical emolient (BIAFINE) emulsion, , Topical, BID, Jonna Coup, MD;  zolpidem (AMBIEN) tablet 5 mg, 5 mg, Per Tube, QHS,MR X 1, Jethro Bolus, MD;  DISCONTD: acetaminophen (TYLENOL) tablet 650 mg, 650 mg, Oral, Q4H PRN, Jethro Bolus, MD;  DISCONTD: alum & mag hydroxide-simeth (MAALOX/MYLANTA) 200-200-20 MG/5ML suspension 60 mL, 60 mL, Oral, Q4H PRN, Jethro Bolus, MD;  DISCONTD: atorvastatin (LIPITOR) tablet 40 mg, 40 mg, Oral, q1800, Jethro Bolus, MD DISCONTD: feeding supplement (OSMOLITE 1.5 CAL) liquid 1,000 mL, 1,000 mL, Per Tube, Continuous, Jethro Bolus, MD, Last Rate: 60  mL/hr at 07/26/11 1815, 1,000 mL at 07/26/11 1815;  DISCONTD: guaiFENesin-dextromethorphan (ROBITUSSIN DM) 100-10 MG/5ML syrup 10 mL, 10 mL, Oral, Q4H PRN, Jethro Bolus, MD;  DISCONTD: Jevity 1.5 Cal (JEVITY 1.5) liquid 1,000 mL, 1,000 mL, Per Tube, Q24H, Jethro Bolus, MD DISCONTD: Jevity 1.5 Cal (JEVITY 1.5) liquid 1,000 mL, 1,000 mL, Per Tube, Continuous, Jethro Bolus, MD;  DISCONTD: morphine 4 MG/ML  injection 4 mg, 4 mg, Intravenous, Q4H PRN, Jethro Bolus, MD, 4 mg at 07/26/11 1804;  DISCONTD: morphine injection 6 mg, 6 mg, Intravenous, Q3H PRN, Jethro Bolus, MD;  DISCONTD: NON FORMULARY 1,000 mL, 1,000 mL, Gastrostomy Tube, 1 day or 1 dose, Jethro Bolus, MD DISCONTD: prochlorperazine (COMPAZINE) tablet 10 mg, 10 mg, Oral, Q6H PRN, Jethro Bolus, MD;  DISCONTD: rosuvastatin (CRESTOR) tablet 20 mg, 20 mg, Oral, Q24H, Jethro Bolus, MD;  DISCONTD: senna-docusate (Senokot-S) tablet 2 tablet, 2 tablet, Oral, BID PRN, Jethro Bolus, MD;  DISCONTD: zolpidem (AMBIEN) tablet 5 mg, 5 mg, Per Tube, QHS PRN, Victorino December, MD   ALLERGIES: Review of patient's allergies indicates no known allergies.    NARRATIVE: Jason Noble was seen today for weekly treatment management. The chart was checked and MVCT images were reviewed. Pt doing better today - no nausea. Inpatient since yesterday.  PHYSICAL EXAMINATION: increased skin irritation - no moist desquamation; pale mucositis diffusely in oropharynx - no thrush  ASSESSMENT: Patient tolerating treatments OK - just one more week, and I discussed the importance of finishing this with him.   PLAN: Continue treatment as planned.

## 2011-07-27 NOTE — Progress Notes (Deleted)
I received a phone call from Dr. Lodema Pilot nurse informing me that [name] has had lots of nausea and vomiting over the last 12 hours and that he is no longer tolerating feedings.  Patient's wife is distraught and concerned that the patient has increased pain and the pain medication is not working,  his nausea medication is not working and she cannot get him to tolerate any nutrition at this point. Dr. Gaylyn Rong is requesting continuous feedings.  Therefore I have written tube feeding orders to continue the same combination of Jevity 1.5 and Osmolite 1.5, however to begin continuous tube feeding at 40 mL an hour and increase q.4 hours to goal at 70 mL an hour as tolerated.  I have also asked for [facility] to deliver a pump to the patient.  In the meantime, I have called Jason Noble and she reports that at this point she believes that the patient will be admitted to the hospital, so she is not sure that she is going to need the feeding pump at this time. However, I have explained to her that we will just play it by ear and we will allow the patient to choose his feeding method as tolerated once his pain and nausea are controlled.  I will be in contact with the inpatient dietitian to communicate the patient's history and monitor his tolerance of feeding.  I will follow up with the patient after discharge.    ______________________________ Zenovia Jarred, RD, LDN Clinical Nutrition Specialist BN/MEDQ  D:  07/26/2011  T:  07/26/2011  Job:  [id number]

## 2011-07-27 NOTE — Progress Notes (Signed)
[facility] INPATIENT PROGRESS NOTE  Name: Jason Noble      MRN: [id number]    Location: 1344/1344-01  Date: 07/27/2011 Time:7:55 AM   Subjective: Interval History:Jason Noble reports feeling much better than yesterday. He no longer feels very fatigued or shivering anymore. His nausea vomiting have really improved with IV fluid and IV antiemetics medication.  He still has some dry heaves. His oral pain is much improved compared to when he was at home. He prefers to use morphine sulfate 2 mg IV every 2 hours is supposed to high-dose with less frequency.  He denies headache, SOB, cough,  palpitation, abdominal pain, bleeding symptom,  Objective: Vital signs in last 24 hours: Temp:  [98.2 F (36.8 C)-98.9 F (37.2 C)] 98.4 F (36.9 C) (11/09 0620) Pulse Rate:  [67-75] 75  (11/09 0620) Resp:  [16-20] 17  (11/09 0620) BP: (151-169)/(80-95) 152/90 mmHg (11/09 0620) SpO2:  [96 %-98 %] 97 % (11/09 0620) Weight:  [177 lb 7.5 oz (80.5 kg)] 177 lb 7.5 oz (80.5 kg) (11/08 1526)    Intake/Output from previous day: 11/08 0701 - 11/09 0700 In: 2149 [I.V.:1625] Out: 2050 [Urine:2050]    PHYSICAL EXAM: General: Thin-appearing man; no more shivering. Eyes: no scleral icterus. ENT: There were no oropharyngeal lesions. Neck was without thyromegaly. The skin of the neck showedsign of desquamation and tender to touch but without purulent discharge. Lymph Node Survey: His right cervical lymph node level II is now about 1x1cm now. Respiratory: lungs were clear bilaterally without wheezing or crackles. Cardiovascular: Regular rate and rhythm, S1/S2, without murmur, rub or gallop. There was no pedal edema. GI: abdomen was soft, flat, nontender, nondistended, without organomegaly. PEG tube was dry, clean, and intact.Muscoloskeletal: no spinal tenderness of palpation of vertebral spine. Skin exam was without echymosis, petichae. Neuro exam was nonfocal. Patient was able to get on and off  exam table without assistance. Gait was stable. Patient was alerted and oriented; much improved from yesterday. Attention was good. Language was appropriate. Mood was normal without depression.  Speech was not pressured. Thought content was not tangential.      Studies/Results: Results for orders placed during the hospital encounter of 07/26/11 (from the past 48 hour(s))  CBC     Status: Abnormal   Collection Time   07/26/11  4:32 PM      Component Value Range Comment   WBC 4.0  4.0 - 10.5 (K/uL)    RBC 3.45 (*) 4.22 - 5.81 (MIL/uL)    Hemoglobin 11.0 (*) 13.0 - 17.0 (g/dL)    HCT 40.9 (*) 81.1 - 52.0 (%)    MCV 91.9  78.0 - 100.0 (fL)    MCH 31.9  26.0 - 34.0 (pg)    MCHC 34.7  30.0 - 36.0 (g/dL)    RDW 91.4  78.2 - 95.6 (%)    Platelets 170  150 - 400 (K/uL)   CREATININE, SERUM     Status: Abnormal   Collection Time   07/26/11  4:32 PM      Component Value Range Comment   Creatinine, Ser 0.94  0.50 - 1.35 (mg/dL)    GFR calc non Af Amer 83 (*) >90 (mL/min)    GFR calc Af Amer >90  >90 (mL/min)   BASIC METABOLIC PANEL     Status: Abnormal   Collection Time   07/27/11  3:59 AM      Component Value Range Comment   Sodium 134 (*) 135 - 145 (  mEq/L)    Potassium 4.3  3.5 - 5.1 (mEq/L)    Chloride 101  96 - 112 (mEq/L)    CO2 27  19 - 32 (mEq/L)    Glucose, Bld 128 (*) 70 - 99 (mg/dL)    BUN 20  6 - 23 (mg/dL)    Creatinine, Ser 1.61  0.50 - 1.35 (mg/dL)    Calcium 8.7  8.4 - 10.5 (mg/dL)    GFR calc non Af Amer 84 (*) >90 (mL/min)    GFR calc Af Amer >90  >90 (mL/min)     Assessment/Plan   1. nausea vomiting: Is much better than yesterday. He only had some dry heaves however no frank nausea vomiting.  Both continue with the oral care using hydrogen peroxide and Yanker suction. No further investigation is needed for nausea/vomiting.  We'll continue with IV fluid normal saline at 125 mL per hour at this time. We'll readdress this this weekend to see if his IVF can be decreased.    Will continue with Compazine, Zofran, Phenergan PR. He also has Reglan; however, has not needed at this time.  2. oropharynx squamous carcinoma:  I have discussed yesterday he had declined any further chemotherapy with the last dose was given next week. I don't have much objection to this as chemotherapy is radiation sensitizer and not the main therapy, and he has recived all previous doses of cisplatin to date. I appreciate Dr. Dorothy Puffer seeeing patient yesterday. As patient is feeling better today than yesterday he agreed to resume radiation today and being off for the weekend.    3. Fatigue: This is better today and his fatigue is only grade 1-2 today.  He is able to ambulate by himself without assistance.  There is no indication for physical and occupation therapy consultation at this time.   4. xerostomia/dysphasia/mucositis: oral mouth care with chlorhexidine in addition to diluted hydrogen peroxide.  5. pain control for #4: Much better tolerated than yesterday. He preferred morphine sulfate 2 mg q. 2 hours as opposed to 6 mg q. 3 hours. I will resume Fentanyl patch at 26mcg/hr which he was using at home for long acting.   6. calorie protein malnutrition: This is moderate.  He is tolerating continuous tube feed with Osmolyte 1.5 at 60 cc per hour without problem. I appreciate nutritional consultation input.  7. history of CVA and CAD: Not active at this time.  8.  left chest wall pain: Most likely secondary to dry heaving. However he is at higher risk of aspiration and pneumonia. I recommended and went ahead and ordered PA/lat CXR today.  9.  skin desquamation: Secondary to treatment.  There is low clinical suspicion for infection. I recommended that patient continue with his skin care with PRUTECT emulsion.  As he is getting radiation today, I will defer silicone dressing by wound care.   8.  Disposition:  Since patient has grade 3 side effects from therapy, will keep patient in house until  he is able to finish radiation therapy without major problem. He is due to receive last dose or radiation a week from today.   FULL CODE  Lovenox for DVT prophy.

## 2011-07-27 NOTE — Progress Notes (Signed)
INITIAL ADULT NUTRITION ASSESSMENT Date: 07/27/2011   Time: 11:01 AM Reason for Assessment: MD consult  ASSESSMENT: Male 70 y.o.  Dx: Intractable nausea vomiting, severe fatigue, and skin burn of the neck  Hx:  Past Medical History  Diagnosis Date  . Cholecystitis   . Abscess of gallbladder   . CVA (cerebral infarction) 2007  . 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 Skin cancer     basal cell, squamous cell  . Prostate cancer 2002  . Basal cell carcinoma of skin   . Squamous cell carcinoma of skin   . Myocardial infarction     approx. 2--8  . Coronary heart disease     has a stent   Related Meds:  Scheduled Meds:   . antiseptic oral rinse  15 mL Mouth Rinse q12n4p  . aspirin EC  325 mg Oral Daily  . atorvastatin  40 mg Per Tube q1800  . chlorhexidine  15 mL Mouth Rinse BID  . enoxaparin  40 mg Subcutaneous Q24H  . fentaNYL  25 mcg Transdermal Q72H  . hydrogen peroxide   Topical QID  . PRUTECT   Topical BID  . zolpidem  5 mg Per Tube QHS,MR X 1  . DISCONTD: atorvastatin  40 mg Oral q1800  . DISCONTD: rosuvastatin  20 mg Oral Q24H   Continuous Infusions:   . sodium chloride 125 mL/hr at 07/27/11 0103  . feeding supplement (OSMOLITE 1.5 CAL) 1,000 mL (07/26/11 1815)   PRN Meds:.Acetaminophen, alum & mag hydroxide-simeth, guaiFENesin-dextromethorphan, hydrocortisone, metoCLOPramide (REGLAN) injection, morphine injection, ondansetron (ZOFRAN) IV, ondansetron (ZOFRAN) IV, ondansetron, ondansetron, prochlorperazine, promethazine, senna-docusate, sodium chloride, DISCONTD: acetaminophen, DISCONTD: alum & mag hydroxide-simeth, DISCONTD: guaiFENesin-dextromethorphan, DISCONTD: morphine, DISCONTD:  morphine injection DISCONTD: prochlorperazine, DISCONTD: senna-docusate, DISCONTD: zolpidem  Ht: 5\' 9"  (175.3 cm)  Wt: 177 lb 7.5 oz (80.5 kg)  Ideal Wt: 72.7kg  % Ideal Wt: 110  Body mass index is 26.21 kg/(m^2).  Food/Nutrition Related Hx: Pt on concurrent chemotherapy  and radiation, followed by outpatient cancer center RD. Pt with PEG, previously on 7 cans of Jevity 1.5, which provided 2485 calories, 106g protein, free water, however pt c/o constipation and wanted less fiber, so pt then switched to 4 cans of Jevity 1.5 and 3 cans of Osmolite 1.5 with free water bolus before and after bolus feeds on 11/5. Pt then c/o nausea and vomiting and MD ordered continuous TF of these 2 formulas, however pt was unable to start this r/t hospital admission. Pt currently on Osmolite 1.5 at 30ml/hr, which provides 2160 calories, 90g protein, free water, which meets 90% of calorie needs, 95% of protein needs. Pt reports stable weight prior to the past 2 weeks in which he experienced severe nausea and vomiting and reports being NPO during this time. Noted almost 5 pound weight loss since 11/5 RD visit, however likely fluid related. Pt denies any nausea and vomiting today, only c/o dry heaves. Pt reports tolerating current formula and rate. Pt requesting Lactulose for bowel movements, as he states that is the only medication that can help him make bowel movements.   Labs:  CMP     Component Value Date/Time   NA 134* 07/27/2011 0359   K 4.3 07/27/2011 0359   CL 101 07/27/2011 0359   CO2 27 07/27/2011 0359   GLUCOSE 128* 07/27/2011 0359   BUN 20 07/27/2011 0359   CREATININE 0.90 07/27/2011 0359   CALCIUM 8.7 07/27/2011 0359   PROT 6.8 07/20/2011  1500   ALBUMIN 3.4* 07/20/2011 1500   AST 17 07/20/2011 1500   ALT 14 07/20/2011 1500   ALKPHOS 88 07/20/2011 1500   BILITOT 0.6 07/20/2011 1500   GFRNONAA 84* 07/27/2011 0359   GFRAA >90 07/27/2011 0359    Intake/Output Summary (Last 24 hours) at 07/27/11 1121 Last data filed at 07/27/11 0500  Gross per 24 hour  Intake   2149 ml  Output   2050 ml  Net     99 ml    Diet Order: Dysphagia 3  Supplements/Tube Feeding: Osmolite 1.5 at 19ml/hr  IVF:    sodium chloride Last Rate: 125 mL/hr at 07/27/11 0103  feeding  supplement (OSMOLITE 1.5 CAL) Last Rate: 1,000 mL (07/26/11 1815)    Estimated Nutritional Needs:   Kcal:2400-2800 Protein:95-120g Fluid:2.4-2.8L  NUTRITION DIAGNOSIS: -Inadequate oral intake (NI-2.1).  Status: Ongoing  RELATED TO: oropharynx cancer, severe dysphagia, xerostomia, and gagging sensation   AS EVIDENCE BY: H&P and pt statement  MONITORING/EVALUATION(Goals): TF to meet > 90% of estimated nutritional needs.   EDUCATION NEEDS: -No education needs identified at this time  INTERVENTION: Recommend continue current TF formula and rate. Hopefully pt's dry heaves will resolve and nausea and vomiting will not return. Noted pt's request for lactulose in chart, awaiting MD order if appropriate. Will monitor.   Dietitian # [phone]  DOCUMENTATION CODES Per approved criteria  -Not Applicable    Marshall Cork 07/27/2011, 11:01 AM

## 2011-07-27 NOTE — Progress Notes (Signed)
10:04 AM This patient remain in house at [facility], room 1344. Spoke with Merlyn Albert, RN assigned to the patient and he confirms patient's status is much improved. Fred, RN confirms nausea, vomiting, pain, and fatigue are improved. Phoned Olegario Messier, patient's wife, on her cell at [phone]. Olegario Messier, patient's wife, confirms this patient is better today and wants to be treated with XRT. Notified Claudia, RT RT on Tomo, and Scott, transporter of these findings. Patient to be treated today at scheduled time of 1415. Dr. Mitzi Hansen notified of these occurences.

## 2011-07-27 NOTE — Progress Notes (Signed)
Mucositis noted in back of throat.  Gargling frequently with 1 part H2O2 to 4 parts water to alleviate and increase expectoration of thickened saliva.  Exclusive enteral feeding via pump. Denies any nausea and diarrhea presently.  Neck very reddened with scabs.  Using Biafine.  Inpatient presently at [facility] in room 1344.  28/33 fractions for base of tongue cancer.

## 2011-07-28 LAB — COMPREHENSIVE METABOLIC PANEL
ALT: 9 U/L (ref 0–53)
AST: 12 U/L (ref 0–37)
Albumin: 2.7 g/dL — ABNORMAL LOW (ref 3.5–5.2)
Alkaline Phosphatase: 73 U/L (ref 39–117)
BUN: 22 mg/dL (ref 6–23)
CO2: 27 mEq/L (ref 19–32)
Calcium: 8.7 mg/dL (ref 8.4–10.5)
Chloride: 103 mEq/L (ref 96–112)
Creatinine, Ser: 0.85 mg/dL (ref 0.50–1.35)
GFR calc Af Amer: >90 mL/min (ref >90)
GFR calc non Af Amer: 86 mL/min — ABNORMAL LOW (ref >90)
Glucose, Bld: 127 mg/dL — ABNORMAL HIGH (ref 70–99)
Potassium: 4 mEq/L (ref 3.5–5.1)
Sodium: 134 mEq/L — ABNORMAL LOW (ref 135–145)
Total Bilirubin: 0.5 mg/dL (ref 0.3–1.2)
Total Protein: 5.5 g/dL — ABNORMAL LOW (ref 6.0–8.3)

## 2011-07-28 LAB — CBC
HCT: 26.7 % — ABNORMAL LOW (ref 39.0–52.0)
Hemoglobin: 9 g/dL — ABNORMAL LOW (ref 13.0–17.0)
MCH: 31.8 pg (ref 26.0–34.0)
MCHC: 33.7 g/dL (ref 30.0–36.0)
MCV: 94.3 fL (ref 78.0–100.0)
Platelets: 147 10*3/uL — ABNORMAL LOW (ref 150–400)
RBC: 2.83 MIL/uL — ABNORMAL LOW (ref 4.22–5.81)
RDW: 15.1 % (ref 11.5–15.5)
WBC: 2.7 10*3/uL — ABNORMAL LOW (ref 4.0–10.5)

## 2011-07-28 MED ORDER — LACTULOSE 10 GM/15ML PO SOLN
30.0000 g | Freq: Once | ORAL | Status: AC
  Administered 2011-07-28: 30 g via ORAL
  Filled 2011-07-28: qty 45

## 2011-07-28 NOTE — Progress Notes (Signed)
Pt requesting lactulose, he states he takes it every 3 days for constipation and it is due tonight. There is no lactulose ordered. MD on call paged and made aware, and an order for a one time dose of lactulose received and given to patient

## 2011-07-28 NOTE — Progress Notes (Signed)
Subjective: Clinically patient seems to be doing well. He is quite anxious about getting better. He is concerned about his caloric intake. He has a feeding tube in place and is receiving Osmolite 1.5-calorie as well as Jevity 1.5-calorie. He is tolerating it quite well. He is currently receiving it at 60 mL per hour. He denies having any nausea or vomiting. For local treatment of his deviation introduced desquamation of the skin of his neck patient is taking biofene which is helping him tremendously. Objective:  Most recent Vital signs: Blood pressure 141/88, pulse 93, temperature 98.5 F (36.9 C), temperature source Oral, resp. rate 20, height 5\' 9"  (1.753 m), weight 177 lb 7.5 oz (80.5 kg), SpO2 99.00%. 5\' 9"  (1.753 m)    Body surface area is 1.98 meters squared.  Vital signs in last 24 hours: Temp:  [98.4 F (36.9 C)-99.2 F (37.3 C)] 98.5 F (36.9 C) (11/10 0630) Pulse Rate:  [68-93] 93  (11/10 0630) Resp:  [18-20] 20  (11/10 0630) BP: (129-160)/(70-88) 141/88 mmHg (11/10 0630) SpO2:  [98 %-99 %] 99 % (11/10 0630) Weight:  [174 lb 8 oz (79.153 kg)] 174 lb 8 oz (79.153 kg) (11/09 1457)  Intake/Output from previous day: 11/09 0701 - 11/10 0700 In: 4323 [P.O.:120; I.V.:3089] Out: 1150 [Urine:1150]  Physical Exam: General appearance: alert, cooperative and appears stated age Neck: no adenopathy, no carotid bruit, no JVD, supple, symmetrical, trachea midline and thyroid not enlarged, symmetric, no tenderness/mass/nodules Resp: clear to auscultation bilaterally Cardio: regular rate and rhythm, S1, S2 normal, no murmur, click, rub or gallop GI: soft, non-tender; bowel sounds normal; no masses,  no organomegaly Extremities: extremities normal, atraumatic, no cyanosis or edema  Lab Results:   Basename 07/28/11 0506 07/26/11 1632  WBC 2.7* 4.0  HGB 9.0* 11.0*  HCT 26.7* 31.7*  PLT 147* 170    CMP:     Component Value Date/Time   NA 134* 07/28/2011 0506   K 4.0 07/28/2011 0506    CL 103 07/28/2011 0506   CO2 27 07/28/2011 0506   GLUCOSE 127* 07/28/2011 0506   BUN 22 07/28/2011 0506   CREATININE 0.85 07/28/2011 0506   CALCIUM 8.7 07/28/2011 0506   PROT 5.5* 07/28/2011 0506   ALBUMIN 2.7* 07/28/2011 0506   AST 12 07/28/2011 0506   ALT 9 07/28/2011 0506   ALKPHOS 73 07/28/2011 0506   BILITOT 0.5 07/28/2011 0506   GFRNONAA 86* 07/28/2011 0506   GFRAA >90 07/28/2011 0506   Micro: Results for orders placed during the hospital encounter of 04/18/11  SURGICAL PCR SCREEN     Status: Normal   Collection Time   04/18/11 11:01 AM      Component Value Range Status Comment   MRSA, PCR NEGATIVE  NEGATIVE  Final    Staphylococcus aureus NEGATIVE  NEGATIVE  Final        Studies/Results: Dg Chest 2 View  07/27/2011  *RADIOLOGY REPORT*  Clinical Data: Shortness of breath, history of throat cancer  CHEST - 2 VIEW  Comparison: 04/18/2011; 04/28/2010; PET CT - 04/10/2011  Findings: Unchanged cardiac silhouette and mediastinal contours with mild tortuosity of the descending thoracic aorta.  Post median sternotomy and CABG.  Minimal linear heterogeneous opacities in the left mid lung.  No focal airspace opacities.  No pleural effusion or pneumothorax.  Unchanged bones.  IMPRESSION: No acute cardiopulmonary disease.  Original Report Authenticated By: Waynard Reeds, M.D.    Medications: I have reviewed the patient's current medications.  Assessment/Plan: 70 y.o.  male with  #1 HPV positive for oropharyngeal carcinoma. Patient is receiving concurrent radiation therapy and chemotherapy.  #2 patient with dysphasia nausea and vomiting secondary to his therapy. He is receiving anti-emetics and pain control and tolerating it well and improving slowly.  #3 pancytopenia: Since counts are still low but he remains asymptomatic and we will continue to follow him.  #4 all questions are answered.      LOS: 2 days   Drue Second, MD Medical/Oncology [facility] [phone] (beeper) [phone] (Office)  07/28/2011, 1:14 PM

## 2011-07-29 DIAGNOSIS — R5383 Other fatigue: Secondary | ICD-10-CM

## 2011-07-29 DIAGNOSIS — C109 Malignant neoplasm of oropharynx, unspecified: Secondary | ICD-10-CM

## 2011-07-29 DIAGNOSIS — R112 Nausea with vomiting, unspecified: Secondary | ICD-10-CM

## 2011-07-29 DIAGNOSIS — K1230 Oral mucositis (ulcerative), unspecified: Secondary | ICD-10-CM

## 2011-07-29 DIAGNOSIS — K121 Other forms of stomatitis: Secondary | ICD-10-CM

## 2011-07-29 DIAGNOSIS — R5381 Other malaise: Secondary | ICD-10-CM

## 2011-07-29 MED ORDER — OXYMETAZOLINE HCL 0.05 % NA SOLN
1.0000 | Freq: Two times a day (BID) | NASAL | Status: DC
  Administered 2011-07-29: 1 via NASAL
  Filled 2011-07-29: qty 15

## 2011-07-29 NOTE — Progress Notes (Signed)
[facility] INPATIENT PROGRESS NOTE  Name: Jason Noble      MRN: [id number]    Location: 1344/1344-01  Date: 07/29/2011 Time:9:02 AM   Subjective: Patient doing better. C/O of nasal congestion.  Objective: Vital signs in last 24 hours: Temp:  [98.6 F (37 C)-99.3 F (37.4 C)] 98.9 F (37.2 C) (11/11 0654) Pulse Rate:  [73-79] 79  (11/11 0654) Resp:  [16-20] 18  (11/11 0654) BP: (141-158)/(64-82) 141/64 mmHg (11/11 0654) SpO2:  [98 %-100 %] 100 % (11/11 0654)    Intake/Output from previous day: 11/10 0701 - 11/11 0700 In: 3244 [P.O.:240; I.V.:4923] Out: 575 [Urine:575]    PHYSICAL EXAM: General: Thin-appearing man; no more shivering.  Eyes: no scleral icterus.  ENT: There were no oropharyngeal lesions.  Neck was without thyromegaly. The skin of the neck showedsign of desquamation and tender to touch but without purulent discharge.  Lymph Node Survey: His right cervical lymph node level II is now about 1x1cm now.  Respiratory: lungs were clear bilaterally without wheezing or crackles.  Cardiovascular: Regular rate and rhythm, S1/S2, without murmur, rub or gallop.  There was no pedal edema.  GI: abdomen was soft, flat, nontender, nondistended, without organomegaly.  PEG tube was dry, clean, and intact.Muscoloskeletal: no spinal tenderness of palpation of vertebral spine. Skin exam was without echymosis, petichae. Neuro exam was nonfocal     Studies/Results: Results for orders placed during the hospital encounter of 07/26/11 (from the past 48 hour(s))  CBC     Status: Abnormal   Collection Time   07/28/11  5:06 AM      Component Value Range Comment   WBC 2.7 (*) 4.0 - 10.5 (K/uL)    RBC 2.83 (*) 4.22 - 5.81 (MIL/uL)    Hemoglobin 9.0 (*) 13.0 - 17.0 (g/dL)    HCT 01.0 (*) 27.2 - 52.0 (%)    MCV 94.3  78.0 - 100.0 (fL)    MCH 31.8  26.0 - 34.0 (pg)    MCHC 33.7  30.0 - 36.0 (g/dL)    RDW 53.6  64.4 - 03.4 (%)    Platelets 147 (*) 150 - 400 (K/uL)    COMPREHENSIVE METABOLIC PANEL     Status: Abnormal   Collection Time   07/28/11  5:06 AM      Component Value Range Comment   Sodium 134 (*) 135 - 145 (mEq/L)    Potassium 4.0  3.5 - 5.1 (mEq/L)    Chloride 103  96 - 112 (mEq/L)    CO2 27  19 - 32 (mEq/L)    Glucose, Bld 127 (*) 70 - 99 (mg/dL)    BUN 22  6 - 23 (mg/dL)    Creatinine, Ser 7.42  0.50 - 1.35 (mg/dL)    Calcium 8.7  8.4 - 10.5 (mg/dL)    Total Protein 5.5 (*) 6.0 - 8.3 (g/dL)    Albumin 2.7 (*) 3.5 - 5.2 (g/dL)    AST 12  0 - 37 (U/L)    ALT 9  0 - 53 (U/L)    Alkaline Phosphatase 73  39 - 117 (U/L)    Total Bilirubin 0.5  0.3 - 1.2 (mg/dL)    GFR calc non Af Amer 86 (*) >90 (mL/min)    GFR calc Af Amer >90  >90 (mL/min)     Assessment/Plan   1. nausea vomiting: Is much better today. He only had some dry heaves however no frank nausea vomiting.  Both continue with the oral  care using hydrogen peroxide and Yanker suction. Will continue with Compazine, Zofran, Phenergan PR. He also has Reglan; however, has not needed at this time.  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 To resume radiation on Monday.  3. Fatigue: This is better today and his fatigue is only grade 1-2 today.  He is able to ambulate by himself without assistance.  There is no indication for physical and occupation therapy consultation at this time.   4. xerostomia/dysphasia/mucositis: oral mouth care with chlorhexidine in addition to diluted hydrogen peroxide.  5. pain control for #4: Much better tolerated than yesterday. OnFentanyl patch at 35mcg/hr which he was using at home for long acting. No PRN IV pain medication needed.  6. calorie protein malnutrition: This is moderate.  He is tolerating continuous tube feed with Osmolyte 1.5 at 60 cc per hour without problem. I appreciate nutritional consultation input.  7. history of CVA and CAD: Not active at this time.  8.  left chest wall pain: Most likely secondary to dry  heaving. However he is at higher risk of aspiration and pneumonia. I recommended and went ahead and ordered PA/lat CXR today.  9.  skin desquamation: Secondary to treatment.  There is low clinical suspicion for infection. I recommended that patient continue with his skin care with PRUTECT emulsion.   8.  Disposition:  Since patient has grade 3 side effects from therapy, will keep patient in house until he is able to finish radiation therapy without major problem.   FULL CODE  Lovenox for DVT prophy.

## 2011-07-30 ENCOUNTER — Ambulatory Visit: Payer: Self-pay

## 2011-07-30 ENCOUNTER — Ambulatory Visit: Payer: Self-pay | Admitting: Oncology

## 2011-07-30 ENCOUNTER — Encounter: Payer: Self-pay | Admitting: Nutrition

## 2011-07-30 ENCOUNTER — Ambulatory Visit
Admission: RE | Admit: 2011-07-30 | Discharge: 2011-07-30 | Disposition: A | Discharge status: Home / Self Care | Payer: Medicare Other | Source: Ambulatory Visit | Attending: Radiation Oncology | Admitting: Radiation Oncology

## 2011-07-30 ENCOUNTER — Ambulatory Visit: Admission: RE | Admit: 2011-07-30 | Payer: Medicare Other | Source: Ambulatory Visit

## 2011-07-30 ENCOUNTER — Telehealth: Payer: Self-pay | Admitting: Radiation Oncology

## 2011-07-30 DIAGNOSIS — E440 Moderate protein-calorie malnutrition: Secondary | ICD-10-CM

## 2011-07-30 DIAGNOSIS — C01 Malignant neoplasm of base of tongue: Secondary | ICD-10-CM

## 2011-07-30 LAB — COMPREHENSIVE METABOLIC PANEL
ALT: 11 U/L (ref 0–53)
AST: 12 U/L (ref 0–37)
Albumin: 2.8 g/dL — ABNORMAL LOW (ref 3.5–5.2)
Alkaline Phosphatase: 79 U/L (ref 39–117)
BUN: 18 mg/dL (ref 6–23)
CO2: 28 mEq/L (ref 19–32)
Calcium: 8.8 mg/dL (ref 8.4–10.5)
Chloride: 101 mEq/L (ref 96–112)
Creatinine, Ser: 0.87 mg/dL (ref 0.50–1.35)
GFR calc Af Amer: >90 mL/min (ref >90)
GFR calc non Af Amer: 85 mL/min — ABNORMAL LOW (ref >90)
Glucose, Bld: 106 mg/dL — ABNORMAL HIGH (ref 70–99)
Potassium: 3.7 mEq/L (ref 3.5–5.1)
Sodium: 134 mEq/L — ABNORMAL LOW (ref 135–145)
Total Bilirubin: 0.4 mg/dL (ref 0.3–1.2)
Total Protein: 6 g/dL (ref 6.0–8.3)

## 2011-07-30 LAB — CBC
HCT: 27.7 % — ABNORMAL LOW (ref 39.0–52.0)
Hemoglobin: 9.4 g/dL — ABNORMAL LOW (ref 13.0–17.0)
MCH: 32 pg (ref 26.0–34.0)
MCHC: 33.9 g/dL (ref 30.0–36.0)
MCV: 94.2 fL (ref 78.0–100.0)
Platelets: 149 10*3/uL — ABNORMAL LOW (ref 150–400)
RBC: 2.94 MIL/uL — ABNORMAL LOW (ref 4.22–5.81)
RDW: 15.3 % (ref 11.5–15.5)
WBC: 1.9 10*3/uL — ABNORMAL LOW (ref 4.0–10.5)

## 2011-07-30 MED ORDER — MORPHINE SULFATE (CONCENTRATE) 20 MG/ML PO SOLN
10.0000 mg | ORAL | Status: DC | PRN
  Administered 2011-07-30 – 2011-07-31 (×5): 10 mg
  Filled 2011-07-30 (×5): qty 1

## 2011-07-30 MED ORDER — LACTULOSE 10 GM/15ML PO SOLN
10.0000 g | Freq: Once | ORAL | Status: DC
  Filled 2011-07-30: qty 15

## 2011-07-30 NOTE — Telephone Encounter (Signed)
10:03 AM Spoke with Arline Asp, RN assigned to this patient today on the oncology floor. Cindy,RN confirms this patient is stable enough to be treated with XRT. Informed Crystal Bell, RT RT on Tomo, this patient is stable and ready to be treated with XRT at his normal treatment time.

## 2011-07-30 NOTE — Progress Notes (Signed)
[facility] INPATIENT PROGRESS NOTE  Name: Jason Noble      MRN: [id number]    Location: 1344/1344-01  Date: 07/30/2011 Time:8:28 AM   Subjective: Interval History:Keahi N Brissette said that his neck pain and oral mucositis pain is moderate around 5/10.  He still requires IV morphine regularly.  He is afraid to increase the dose of  Fentanyl patch at this time.  He still has dry heave from thick phlegm, but no frank nausea/vomiting.  He has insomnia due to thick phlegm and anxiety.  He is able to ambulate the hall about 5-29min each day.  He still has constipation.    Objective: Vital signs in last 24 hours: Temp:  [98.3 F (36.8 C)-98.8 F (37.1 C)] 98.3 F (36.8 C) (11/12 0546) Pulse Rate:  [66-71] 71  (11/12 0546) Resp:  [16-21] 21  (11/12 0546) BP: (99-143)/(64-80) 143/80 mmHg (11/12 0546) SpO2:  [99 %-100 %] 100 % (11/12 0546)    Intake/Output from previous day: 11/11 0701 - 11/12 0700 In: 4504 [I.V.:2832] Out: 300 [Urine:300]    PHYSICAL EXAM:  General: Thin-appearing man; no more shivering. Eyes: no scleral icterus. ENT: There were no oropharyngeal lesions. Neck was without thyromegaly. The skin of the neck showedsign of desquamation and tender to touch but without purulent discharge. Lymph Node Survey: His right cervical lymph node level II is now about 1x1cm now. Respiratory: lungs were clear bilaterally without wheezing or crackles. Cardiovascular: Regular rate and rhythm, S1/S2, without murmur, rub or gallop. There was no pedal edema. GI: abdomen was soft, flat, nontender, nondistended, without organomegaly. PEG tube was dry, clean, and intact.Muscoloskeletal: no spinal tenderness of palpation of vertebral spine. Skin exam was without echymosis, petichae. Neuro exam was nonfocal. Patient was able to ambulate the room without assistance. Gait was stable. Patient was alerted and oriented; much improved from last week. Attention was good. Language was  appropriate. Mood was normal without depression. Speech was not pressured. Thought content was not tangential.    Studies/Results: Results for orders placed during the hospital encounter of 07/26/11 (from the past 48 hour(s))  CBC     Status: Abnormal   Collection Time   07/30/11  5:06 AM      Component Value Range Comment   WBC 1.9 (*) 4.0 - 10.5 (K/uL)    RBC 2.94 (*) 4.22 - 5.81 (MIL/uL)    Hemoglobin 9.4 (*) 13.0 - 17.0 (g/dL)    HCT 40.9 (*) 81.1 - 52.0 (%)    MCV 94.2  78.0 - 100.0 (fL)    MCH 32.0  26.0 - 34.0 (pg)    MCHC 33.9  30.0 - 36.0 (g/dL)    RDW 91.4  78.2 - 95.6 (%)    Platelets 149 (*) 150 - 400 (K/uL)   COMPREHENSIVE METABOLIC PANEL     Status: Abnormal   Collection Time   07/30/11  5:06 AM      Component Value Range Comment   Sodium 134 (*) 135 - 145 (mEq/L)    Potassium 3.7  3.5 - 5.1 (mEq/L)    Chloride 101  96 - 112 (mEq/L)    CO2 28  19 - 32 (mEq/L)    Glucose, Bld 106 (*) 70 - 99 (mg/dL)    BUN 18  6 - 23 (mg/dL)    Creatinine, Ser 2.13  0.50 - 1.35 (mg/dL)    Calcium 8.8  8.4 - 10.5 (mg/dL)    Total Protein 6.0  6.0 - 8.3 (g/dL)  Albumin 2.8 (*) 3.5 - 5.2 (g/dL)    AST 12  0 - 37 (U/L)    ALT 11  0 - 53 (U/L)    Alkaline Phosphatase 79  39 - 117 (U/L)    Total Bilirubin 0.4  0.3 - 1.2 (mg/dL)    GFR calc non Af Amer 85 (*) >90 (mL/min)    GFR calc Af Amer >90  >90 (mL/min)    No results found.   Assessment/Plan  1. nausea vomiting: Is much better than last week. He only had some dry heaves however no frank nausea vomiting. Both continue with the oral care using hydrogen peroxide and Yanker suction. Will discontinue IVF as his renal function has been norma.  Will continue with Compazine, Zofran, Phenergan PR. He also has Reglan; however, has not needed at this time.  2. oropharynx squamous carcinoma:  No more chemo per his request.  He only had one more session today anyway.  He will continue with the last 5 sessions of radiation per his  discussion with Dr. Mitzi Hansen. 3. Fatigue: This is better today and his fatigue is only grade 1-2 today. He is able to ambulate by himself without assistance. There is no indication for physical and occupation therapy consultation at this time.  4. Xerostomia/dysphasia/mucositis: oral mouth care with chlorhexidine in addition to diluted hydrogen peroxide.  5. pain control for #4: will continue with Fentanyl 63mcg/hr change every 3 days.  As his pain is stable, and we are trying to look for an outpt pain control regimen, I will change IV morphine to elixir form.   6. calorie protein malnutrition: This is moderate. He is tolerating continuous tube feed with Osmolyte 1.5 at 60 cc per hour without problem. As his IVF is discontinued today, I appreciate Nutrition input as to how much free water he will need via PEG tube.   7. history of CVA and CAD: Not active at this time.   8 . skin desquamation: Secondary to treatment. There is low clinical suspicion for infection. I recommended that patient continue with his skin care with PRUTECT emulsion. As he is getting radiation today, I will defer silicone dressing by wound care.   9.  Insomnia:  2/2  9.  Disposition:  He would like to see how he tolerate PEG tube with more free water and morphine elixir today.    FULL CODE  Lovenox for DVT prophy.

## 2011-07-30 NOTE — Progress Notes (Signed)
The patient reports that he has had some issues with constipation.  He continues to tolerate 7 cans of Jevity 1.5 daily to provide 2485 calories, 106 grams of protein and 2520 mL of free water.  His weight is stable at 181.8 pounds.  He reports increased vomiting for which the physician has prescribed increased Ativan and increased peroxide rinses. The patient would like to change the tube feedings to provide a bit less fiber.  NUTRITION DIAGNOSIS:  Inadequate oral intake continues.  INTERVENTION:  I will change tube feedings to 4 cans of Jevity 1.5 plus 3 cans of Osmolite 1.5 to total 7 cans of formula daily via PEG.  The patient will begin with one can of Jevity 1.5 via his PEG with 120 mL of free water before and after each bolus.  He will alternate Jevity 1.5 with Osmolite 1.5  throughout the day every 2-3 hours to give him a total of 7 cans daily.  He will continue to eat and drink as he can.  MONITORY EVALUATION AND GOALS:  The patient will tolerate tube feeding changes to promote regular bowel movements and promote weight maintenance.  NEXT VISIT:  Monday, November 12th.    ______________________________ Zenovia Jarred, RD, LDN Clinical Nutrition Specialist BN/MEDQ  D:  07/23/2011  T:  07/23/2011  Job:  [id number]

## 2011-07-30 NOTE — Progress Notes (Signed)
I received a phone call from Dr. Lodema Pilot nurse informing me that Jason Noble has had lots of nausea and vomiting over the last 12 hours and that he is no longer tolerating bolus feedings.  Patient's wife is distraught and concerned that the patient has increased pain and the pain medication is not working,  his nausea medication is not working and she cannot get him to tolerate any nutrition at this point. Dr. Gaylyn Rong is requesting continuous feedings.  Therefore I have written tube feeding orders to continue the same combination of Jevity 1.5 and Osmolite 1.5, however to begin continuous tube feeding at 40 mL an hour and increase q.4 hours to goal at 70 mL an hour as tolerated.  I have also written orders for [facility] to deliver a pump to the patient.  In the meantime, I have called Jason Noble and she reports that at this point she believes that the patient will be admitted to the hospital, so she is not sure she is going to need the feeding pump at this time. However, I have explained to her that we will just play it by ear and we will allow the patient to choose his feeding method as tolerated once his pain and nausea are controlled.  I will be in contact with the inpatient dietitian to communicate the patient's history and monitor his tolerance of feeding.  I will follow up with the patient after discharge.    ______________________________ Zenovia Jarred, RD, LDN Clinical Nutrition Specialist BN/MEDQ  D:  07/26/2011  T:  07/26/2011  Job:  [id number]

## 2011-07-30 NOTE — Consult Note (Signed)
Nutrition Follow Up Note  Diet: Dysphagia 3, 0% intake, pt reliant on TF for meeting nutritional needs  TF: Osmolite 1.5 at 79ml/hr and Jevity 1.5 at 68ml/hr, switches from one formula to the other daily. Osmolite 1.5 and Jevity 1.5 at 60ml/hr provide 2160 calories, 90g protein, free water, which meets 90% of calorie needs, 95% of protein needs. Jevity 1.5 at 48ml/hr provides 32g fiber. Pt reports tolerating TF well, no frank nausea/vomiting, still suctioning out mucous PRN. Noted plans to d/c IVF, recommend water flushes 5 times/day to provide pt with total water intake from PEG of . Explained role and importance of water flushes to pt. Informed pt that he can adjust his water flushes to fit his schedule, as long as it provides 1.2L water.   Scheduled Meds:   . antiseptic oral rinse  15 mL Mouth Rinse q12n4p  . atorvastatin  40 mg Per Tube q1800  . chlorhexidine  15 mL Mouth Rinse BID  . enoxaparin  40 mg Subcutaneous Q24H  . fentaNYL  25 mcg Transdermal Q72H  . hydrogen peroxide   Topical QID  . lactulose  10 g Oral Once  . oxymetazoline  1 spray Each Nare BID  . PRUTECT   Topical BID  . zolpidem  5 mg Per Tube QHS,MR X 1   Continuous Infusions:   . feeding supplement (OSMOLITE 1.5 CAL) 1,000 mL (07/29/11 2252)  . Jevity 1.5 Cal 1,000 mL (07/29/11 0543)  . DISCONTD: sodium chloride 125 mL/hr at 07/30/11 0401   PRN Meds:.Acetaminophen, alum & mag hydroxide-simeth, guaiFENesin-dextromethorphan, hydrocortisone, metoCLOPramide (REGLAN) injection, morphine, ondansetron (ZOFRAN) IV, ondansetron (ZOFRAN) IV, ondansetron, ondansetron, prochlorperazine, promethazine, senna-docusate, sodium chloride, DISCONTD:  morphine injection  CMP     Component Value Date/Time   NA 134* 07/30/2011 0506   K 3.7 07/30/2011 0506   CL 101 07/30/2011 0506   CO2 28 07/30/2011 0506   GLUCOSE 106* 07/30/2011 0506   BUN 18 07/30/2011 0506   CREATININE 0.87 07/30/2011 0506   CALCIUM 8.8  07/30/2011 0506   PROT 6.0 07/30/2011 0506   ALBUMIN 2.8* 07/30/2011 0506   AST 12 07/30/2011 0506   ALT 11 07/30/2011 0506   ALKPHOS 79 07/30/2011 0506   BILITOT 0.4 07/30/2011 0506   GFRNONAA 85* 07/30/2011 0506   GFRAA >90 07/30/2011 0506    Intake/Output Summary (Last 24 hours) at 07/30/11 1151 Last data filed at 07/30/11 0700  Gross per 24 hour  Intake   4504 ml  Output    300 ml  Net   4204 ml   Nutrition diagnosis - Inadequate oral intake - persists  Goal - TF to meet > 90% estimated nutritional needs - met  Plan - Pt tolerating current TF regimen well. Education pt on TF water flushes, education needs addressed. Recommend water flushes 5 times/day once IVF d/c. Will monitor.   Dietitian # [phone]

## 2011-07-31 ENCOUNTER — Ambulatory Visit
Admission: RE | Admit: 2011-07-31 | Discharge: 2011-07-31 | Disposition: A | Discharge status: Home / Self Care | Payer: Medicare Other | Source: Ambulatory Visit | Attending: Radiation Oncology | Admitting: Radiation Oncology

## 2011-07-31 ENCOUNTER — Telehealth: Payer: Self-pay | Admitting: *Deleted

## 2011-07-31 ENCOUNTER — Other Ambulatory Visit: Payer: Self-pay | Admitting: *Deleted

## 2011-07-31 ENCOUNTER — Other Ambulatory Visit: Payer: Self-pay | Admitting: Oncology

## 2011-07-31 DIAGNOSIS — C109 Malignant neoplasm of oropharynx, unspecified: Secondary | ICD-10-CM

## 2011-07-31 DIAGNOSIS — B977 Papillomavirus as the cause of diseases classified elsewhere: Secondary | ICD-10-CM

## 2011-07-31 DIAGNOSIS — C01 Malignant neoplasm of base of tongue: Secondary | ICD-10-CM

## 2011-07-31 MED ORDER — SENNOSIDES-DOCUSATE SODIUM 8.6-50 MG PO TABS: 2.0000 | ORAL_TABLET | Freq: Two times a day (BID) | ORAL | Status: DC | PRN

## 2011-07-31 MED ORDER — JEVITY 1.5 CAL PO LIQD: ORAL | Status: DC

## 2011-07-31 MED ORDER — BIOTENE DRY MOUTH MT LIQD: 15.0000 mL | Freq: Two times a day (BID) | OROMUCOSAL | Status: DC

## 2011-07-31 MED ORDER — BIAFINE EX EMUL
CUTANEOUS | Status: DC | PRN
  Administered 2011-07-31 – 2011-08-04 (×2): via TOPICAL

## 2011-07-31 MED ORDER — BIAFINE EX EMUL
Freq: Two times a day (BID) | CUTANEOUS | Status: AC
  Administered 2011-07-30: 14:00:00 via TOPICAL

## 2011-07-31 MED ORDER — MORPHINE SULFATE (CONCENTRATE) 20 MG/ML PO SOLN: 10.0000 mg | ORAL | Status: DC | PRN

## 2011-07-31 MED ORDER — OSMOLITE 1.5 CAL PO LIQD: 1000.0000 mL | ORAL | Status: DC

## 2011-07-31 MED ORDER — HYDROGEN PEROXIDE 3 % EX SOLN: Freq: Four times a day (QID) | CUTANEOUS | Status: DC

## 2011-07-31 MED ORDER — FENTANYL 25 MCG/HR TD PT72: 1.0000 | MEDICATED_PATCH | TRANSDERMAL | Status: AC

## 2011-07-31 MED ORDER — LIDOCAINE VISCOUS 2 % MT SOLN: 20.0000 mL | Freq: Four times a day (QID) | OROMUCOSAL | Status: DC | PRN

## 2011-07-31 NOTE — Telephone Encounter (Signed)
Call from pt stating his feeding pump being delivered by [facility] but it is not a portable pump as he understood Dr. Gaylyn Rong would order. Pt has daily XRT appts and requires portable pump to provide continuous feedings and able to leave the home often as necessary.  Informed pt I would call [facility] to order portable pump per Dr. Lodema Pilot orders.  Encouraged pt to keep the pump being delivered now to use until a portable type pump can be delievered.  Pt agreed and verbalized understanding.  Called [facility] and spoke w/ several persons.  Was eventually connected w/ Burna Mortimer on Nutrition Care Team. Gave verbal order for portable pump and also faxed Rx for portable pump to [facility] at fax#[phone].

## 2011-07-31 NOTE — Progress Notes (Signed)
Encounter addended by: Ardell Isaacs on: 07/31/2011 11:01 AM<BR>     Documentation filed: Inpatient MAR

## 2011-07-31 NOTE — Progress Notes (Signed)
CM consult done. See CM notes in shadow chart.  Manning, Linda Wyche RN BSN CCM [phone] 07/31/2011    

## 2011-07-31 NOTE — Progress Notes (Signed)
[facility] CSW Psychosocial Assessment  Clinical Social Worker was referred by: Dr. Gaylyn Rong  Clinical Social worker met with pt at bedside.  Patient's knowledge about cancer and its treatment including level of understanding, reactions, goals for care, and expectations: Pt feels that he is well-informed regarding his diagnosis and treatment plan. CSW shared that she has counseled many head and neck cancer patients and they have all dealt with a variety of side effects, both physical and emotional due to the complexity of this cancer.  Pt expressed comfort in knowing that others have similar issues and processed how his cancer affects so many aspects of his life including eating, exercising, communicating, and his overall lifestyle. Pt's biggest concern is returning home and not being able to manage his feedings, medication, etc. CSW assured pt that he will receive education from his inpatient nurse and/or advanced home care.   Characteristics of the patient's support system: Pt identifies his spouse, three sons, and church family as his major support system. CSW inquired specifically on how pt's spouse is adjusting to pt's cancer illness, pt states she has "been a god send" throughout the process. Two of pt's children live in [place] and he speaks with them daily.  Patient and family psychosocial functioning including strengths, limitations, and coping skills: Pt identifies his biggest coping mechanism to be his strong sense of faith. Pt spent a great deal of time sharing how his faith has played a part in his diagnosis and how he communicates with his family and friends.  Identifications of barriers to care: Pt and CSW unable to identify any barriers to care at this time.   Availability of community resources: CSW did discuss matching pt with a head and neck survivor to allow pt to receive perspective from someone who "been through it". Pt somewhat interested, but denied at this time.  Clinical Social  Worker follow up needed: No, please re consult if other needs arise.   CSW completed CSW Psychosocial assessment yellow note, please review in shadow box.

## 2011-07-31 NOTE — Discharge Summary (Signed)
Physician Discharge Summary   Patient ID: Jason Noble [id number] y.o. [birth year]  Admit date: 07/26/2011  Discharge date and time: 07/31/2011  Admitting Physician: Jason Bolus, MD   Discharge Physician:  Jason Bolus, MD   Admission Diagnoses: intractable nausea/vomiting Discharge Diagnoses: intractable nausea/vomiting  Admission Condition: stable    Discharged Condition: stable  Indication for Admission:  intractable nausea/vomiting  Hospital Course:  1. nausea vomiting: due to thick phlegm from xerotomia.  Once he was performing mouth rinses routinely, his thick phlegm improved and his nausea/vomiting improved significantly.  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 He was seen by Dr. Dorothy Noble from Radiation Oncology who convinced patient to resume the last 6 sessions of radiation.  He was able to tolerate it better, and had 3 more sessions upon discharge.  3. Fatigue: He felt better with IVF and pain control during the course of the admission. He was able to ambulate without assistance.   4. Xerostomia/dysphasia/mucositis: improved with oral mouth care with chlorhexidine/biotine  in addition to diluted hydrogen peroxide.  5. pain control for #4: he was resumed on outpatient regimen of Fentanyl patch 58mcg/hr transdermal change every 3 days.  He required IV morphine during the first 4 days of hospital course due to severe mucositis.  During the last 2 days of the hospital course, his pain was better controlled; thus, IV morphine was switched to Morphine Sulfate elixir which he tolerated well and had adequate control of his pain.   He also had left rib pain from most likely dry heaving.  CXR did not show evidence of pathology.  This pain resolved on discharge.  6. calorie protein malnutrition: This was moderate. He is tolerating continuous tube feed with Osmolyte 1.5 alternating with Jevity at 60 cc per hour without problem. He also required IVF during the  fist 4 days of the hospital course due to inability to take adequate free water.  He did not have renal insufficiency.  His IVF was discontinued on the last 24 hours of the hospital course; and he tolerated free water Noble via PEG tube without problem.  7. history of CVA and CAD: Not active during this hospital course.  8 . skin desquamation: Secondary to treatment. The pain improved with PRUTECT emulsion which he will continue as out patient.  Once he finishes radiation and still has severe skin burn, we may consider outpatient wound care consult to see if silicone dressing is appropriate.  9.  Pancytopenia:  Due to recent chemo.  There was no sign of infection or fever during the hospital course.  He did not require transfusion.    Consults:  Nutrition and Radiation Oncology   Significant Diagnostic Studies:  2 view chest XRR on 07/27/2011  CHEST - 2 VIEW  Comparison: 04/18/2011; 04/28/2010; PET CT - 04/10/2011  Findings: Unchanged cardiac silhouette and mediastinal contours  with mild tortuosity of the descending thoracic aorta. Post median  sternotomy and CABG. Minimal linear heterogeneous opacities in the  left mid lung. No focal airspace opacities. No pleural effusion  or pneumothorax. Unchanged bones.  IMPRESSION:  No acute cardiopulmonary disease.     Treatments: radiation per radiation oncology.   Discharge Exam: General: Thin-appearing man; no more shivering. Eyes: no scleral icterus. ENT: There were no oropharyngeal lesions. Neck was without thyromegaly. The skin of the neck showedsign of desquamation and tender to touch but without purulent discharge. Lymph Node Survey: His right cervical lymph node level II is now about  1x1cm now. Respiratory: lungs were clear bilaterally without wheezing or crackles. Cardiovascular: Regular rate and rhythm, S1/S2, without murmur, rub or gallop. There was no pedal edema. GI: abdomen was soft, flat, nontender, nondistended, without organomegaly.  PEG tube was dry, clean, and intact.Muscoloskeletal: no spinal tenderness of palpation of vertebral spine. Skin exam was without echymosis, petichae. Neuro exam was nonfocal. Patient was able to ambulate the room without assistance. Gait was stable. Patient was alerted and oriented; much improved from last week. Attention was good. Language was appropriate. Mood was normal without depression. Speech was not pressured. Thought content was not tangential.    Disposition: Home or Self Care  Patient Instructions:  Current Discharge Medication List    START taking these medications   Details  antiseptic oral rinse (BIOTENE) LIQD 15 mLs by Mouth Rinse route 2 times daily at 12 noon and 4 pm. Qty: 500 mL, Refills: 3    fentaNYL (DURAGESIC - DOSED MCG/HR) 25 MCG/HR Place 1 patch (25 mcg total) onto the skin every 3 (three) days. Qty: 10 patch, Refills: 0    hydrogen peroxide 3 % external solution Apply topically QID. Mix with clean water; 1 part hydrogen peroxide with 4 parts water; gargle and spit every 6 hours for thick phlegm. Qty: 500 mL, Refills: 3    lidocaine (XYLOCAINE) 2 % solution Take 20 mLs by mouth 4 (four) times daily as needed for pain (mouth pain). Qty: 500 mL, Refills: 3    morphine 20 MG/ML concentrated solution Place 0.5 mLs (10 mg total) into feeding tube every 2 (two) hours as needed for pain. Qty: 500 mL, Refills: 0    senna-docusate (SENOKOT-S) 8.6-50 MG per tablet Place 2 tablets into feeding tube 2 (two) times daily as needed for constipation. Qty: 30 tablet, Refills: 3      CONTINUE these medications which have CHANGED   Details  !! Jevity 1.5 Cal (JEVITY 1.5) LIQD Alternate with Osmolite 1.5 EVERY 16 HOURS AND CHANGE TUBING EVERY 24 HOURS.  Rate at 60mL continuously.   Add Free water via PEG tube every 4 hours while awake. Qty: 948 mL, Refills: 30    !! Nutritional Supplements (FEEDING SUPPLEMENT, OSMOLITE 1.5 CAL,) LIQD Place 1,000 mLs into feeding tube  continuous. Qty: 1000 mL, Refills: 30     !! - Potential duplicate medications found. Please discuss with provider.    CONTINUE these medications which have NOT CHANGED   Details  aspirin EC 325 MG EC tablet Take 325 mg by mouth daily.      atorvastatin (LIPITOR) 40 MG tablet Take 40 mg by mouth daily.      lactulose (CHRONULAC) 10 GM/15ML solution Take 45 mLs (30 g total) by mouth every three (3) days as needed. Qty: 240 mL, Refills: 0    LORazepam (ATIVAN) 0.5 MG tablet Take 0.5 mg by mouth every 6 (six) hours as needed.     ondansetron (ZOFRAN) 8 MG tablet Take 8 mg by mouth every 12 (twelve) hours as needed.     PRESCRIPTION MEDICATION Take 5 mLs by mouth every 4 (four) hours as needed. Q-Dryl/Nystat/Almaco/Distille--Swish and swallow 1 teaspoonful (5 m'ls) every 4 hours as needed for mouth soreness     prochlorperazine (COMPAZINE) 10 MG tablet Take 10 mg by mouth every 6 (six) hours as needed.     promethazine (PHENERGAN) 25 MG suppository INSERT 1 SUPPOSITORY RECTALLY EVERY 6 HOURS AS NEEDED FOR NAUSEA Qty: 12 suppository, Refills: 0      STOP taking  these medications     HYDROcodone-acetaminophen (LORTAB) 7.5-500 MG/15ML solution      metoCLOPramide (REGLAN) 10 MG tablet        Activity: activity as tolerated Diet: continuous tube feed via PEG at 28ml/hr alternating Osmolyte 1.5 with Jevity 1.5 every 16 hours.  Free water 240 mL via PEG 5 times daily.  Wound Care: keep wound clean and dry from PEG tube site.   Follow-up with me at [facility] within 10 days.  Please call for appointment.   SignedJethro Noble 07/31/2011 8:32 AM

## 2011-07-31 NOTE — Progress Notes (Signed)
Discharge instructions and prescriptions given.  Left via wheelchair to family waiting.

## 2011-08-01 ENCOUNTER — Telehealth: Payer: Self-pay | Admitting: Oncology

## 2011-08-01 ENCOUNTER — Other Ambulatory Visit: Payer: Self-pay | Admitting: Oncology

## 2011-08-01 ENCOUNTER — Telehealth: Payer: Self-pay | Admitting: *Deleted

## 2011-08-01 ENCOUNTER — Ambulatory Visit
Admission: RE | Admit: 2011-08-01 | Discharge: 2011-08-01 | Disposition: A | Discharge status: Home / Self Care | Payer: Medicare Other | Source: Ambulatory Visit | Attending: Radiation Oncology | Admitting: Radiation Oncology

## 2011-08-01 ENCOUNTER — Other Ambulatory Visit: Payer: Self-pay | Admitting: *Deleted

## 2011-08-01 DIAGNOSIS — C109 Malignant neoplasm of oropharynx, unspecified: Secondary | ICD-10-CM

## 2011-08-01 MED ORDER — MORPHINE SULFATE (CONCENTRATE) 20 MG/ML PO SOLN: 10.0000 mg | ORAL | Status: DC | PRN

## 2011-08-01 MED ORDER — LIDOCAINE VISCOUS 2 % MT SOLN: 20.0000 mL | Freq: Four times a day (QID) | OROMUCOSAL | Status: DC | PRN

## 2011-08-01 MED ORDER — LIDOCAINE VISCOUS 2 % MT SOLN: 20.0000 mL | Freq: Four times a day (QID) | OROMUCOSAL | Status: AC | PRN

## 2011-08-01 NOTE — Telephone Encounter (Signed)
Patient called reporting the Roxanol prescription needs to be re-written.  Morphine 63ml/ml conc.  Take 0.5 ml to 1ml via feeding tube q2 hours prn pain, quantity of 20 ml is what was written per patient.  Due to packaging it can't be filled for the quantity prescribed.  Jason Noble believes the quantity was for 20ml but it comes 30 ml.He also was given Lidocaine 2% which he is to swallow but the packaging says do not put in mouth.  WOULD LIKE TO PICK UP NEW SCRIPTS WHEN HE ARRIVES AT 2:00PM FOR TODAY'S RADIATION.  Can be reached via cell number [phone].  Uses [place] [facility] [phone] opt. 4.  Spoke with [employer] April who says pt. Did pick up of viscous lidocaine 2% and they owe him an additional but they did not label this as not to be placed in mouth.  No information on roxanol so patient has the script.  Also asked if someone could look at the redness with his feeding tube insertion site.  Denies fever, drainage so this nurse said it's okay but to notify radiation when he arrives and a nurse can assess it in the RT dept.

## 2011-08-01 NOTE — Telephone Encounter (Signed)
lmonvm advising the pt of his nov 2012 appt with dr Gaylyn Rong

## 2011-08-01 NOTE — Progress Notes (Signed)
Patient presented to the nurses station requesting to be seen by this Clinical research associate. Escorted patient to exam room. Patient asked this writer to look at his PEG tube and make sure "it looked ok." PEG tube intact without redness, edema or drainage at insertion site. PEG tube properly dressed and secured. Mild erythema noted at the anterior portion of the neck. Patient using Biafine as directed. Encouraged patient to gently cleanse his neck tonight to removed Biafine build up. Patient verbalized understanding.

## 2011-08-02 ENCOUNTER — Telehealth: Payer: Self-pay | Admitting: *Deleted

## 2011-08-02 ENCOUNTER — Ambulatory Visit
Admission: RE | Admit: 2011-08-02 | Discharge: 2011-08-02 | Disposition: A | Discharge status: Home / Self Care | Payer: Medicare Other | Source: Ambulatory Visit | Attending: Radiation Oncology | Admitting: Radiation Oncology

## 2011-08-02 NOTE — Telephone Encounter (Signed)
VM received from pt's wife asking if pt has any f/u visit w/ Dr. Gaylyn Rong scheduled?  Called back and s/w pt and informed of appt on 08/07/11 at 10am.  He verbalized understanding.

## 2011-08-03 ENCOUNTER — Ambulatory Visit
Admission: RE | Admit: 2011-08-03 | Discharge: 2011-08-03 | Disposition: A | Discharge status: Home / Self Care | Payer: Medicare Other | Source: Ambulatory Visit | Attending: Radiation Oncology | Admitting: Radiation Oncology

## 2011-08-03 DIAGNOSIS — C01 Malignant neoplasm of base of tongue: Secondary | ICD-10-CM

## 2011-08-03 NOTE — Progress Notes (Signed)
[facility] Radiation Oncology Weekly Treatment Note    Name: Jason Noble Date: 08/03/2011 MRN: [id number] DOB: [birth year]  Status:outpatient    Current dose: 4098  Current fraction:33  Planned dose:6996  Planned fraction:33   MEDICATIONS:Current outpatient prescriptions:antiseptic oral rinse (BIOTENE) LIQD, 15 mLs by Mouth Rinse route 2 times daily at 12 noon and 4 pm., Disp: 500 mL, Rfl: 3;  aspirin EC 325 MG EC tablet, Take 325 mg by mouth daily.  , Disp: , Rfl: ;  atorvastatin (LIPITOR) 40 MG tablet, Take 40 mg by mouth daily.  , Disp: , Rfl: ;  fentaNYL (DURAGESIC - DOSED MCG/HR) 25 MCG/HR, Place 1 patch (25 mcg total) onto the skin every 3 (three) days., Disp: 10 patch, Rfl: 0 hydrogen peroxide 3 % external solution, Apply topically QID. Mix with clean water; 1 part hydrogen peroxide with 4 parts water; gargle and spit every 6 hours for thick phlegm., Disp: 500 mL, Rfl: 3;  Jevity 1.5 Cal (JEVITY 1.5) LIQD, Alternate with Osmolite 1.5 EVERY 12 HOURS AND CHANGE TUBING EVERY 24 HOURS.  Rate at 60mL continuously.   Add Free water 250 mL via PEG tube 5 times daily., Disp: 948 mL, Rfl: 30 lactulose (CHRONULAC) 10 GM/15ML solution, Take 45 mLs (30 g total) by mouth every three (3) days as needed., Disp: 240 mL, Rfl: 0;  lidocaine (XYLOCAINE) 2 % solution, Take 20 mLs by mouth 4 (four) times daily as needed for pain (swish/gargle/and spit prn pain)., Disp: 500 mL, Rfl: 0;  LORazepam (ATIVAN) 0.5 MG tablet, Take 0.5 mg by mouth every 6 (six) hours as needed. , Disp: , Rfl:  morphine 20 MG/ML concentrated solution, Take 0.5 mLs (10 mg total) by mouth every 2 (two) hours as needed for pain., Disp: 30 mL, Rfl: 0;  Nutritional Supplements (FEEDING SUPPLEMENT, OSMOLITE 1.5 CAL,) LIQD, Place 1,000 mLs into feeding tube continuous. Alternate with Jevity 1.5 EVERY 12 HOURS AND CHANGE TUBING EVERY 24 HOURS.  Rate at 60mL continuously.   Add Free water 250 mL via PEG tube 5 times  daily., Disp: 1000 mL, Rfl: 30 ondansetron (ZOFRAN) 8 MG tablet, Take 8 mg by mouth every 12 (twelve) hours as needed. , Disp: , Rfl: ;  PRESCRIPTION MEDICATION, Take 5 mLs by mouth every 4 (four) hours as needed. Q-Dryl/Nystat/Almaco/Distille--Swish and swallow 1 teaspoonful (5 m'ls) every 4 hours as needed for mouth soreness , Disp: , Rfl: ;  prochlorperazine (COMPAZINE) 10 MG tablet, Take 10 mg by mouth every 6 (six) hours as needed. , Disp: , Rfl:  promethazine (PHENERGAN) 25 MG suppository, INSERT 1 SUPPOSITORY RECTALLY EVERY 6 HOURS AS NEEDED FOR NAUSEA, Disp: 12 suppository, Rfl: 0;  senna-docusate (SENOKOT-S) 8.6-50 MG per tablet, Place 2 tablets into feeding tube 2 (two) times daily as needed for constipation., Disp: 30 tablet, Rfl: 3 No current facility-administered medications for this encounter. Facility-Administered Medications Ordered in Other Encounters: topical emolient (BIAFINE) emulsion, , Topical, PRN, Lurline Hare, MD;  topical emolient (BIAFINE) emulsion, , Topical, BID, Jonna Coup, MD  ALLERGIES: Review of patient's allergies indicates no known allergies.   NARRATIVE: Ewing Schlein Wernert was seen today for weekly treatment management. The chart was checked and MVCT images were reviewed. Pt stable - very glad to be finished today. No new complaints.  PHYSICAL EXAMINATION: weight is 177 lb 14.4 oz (80.695 kg).    Diffuse erythema in neck - no moist desquamation; mucositis in oral cavity - unchanged  ASSESSMENT/ Plan: Completes treatment today. Followup  in one month. Did well at end - feeling much better than 1 week ago.

## 2011-08-07 ENCOUNTER — Ambulatory Visit (HOSPITAL_BASED_OUTPATIENT_CLINIC_OR_DEPARTMENT_OTHER): Payer: Medicare Other | Admitting: Oncology

## 2011-08-07 ENCOUNTER — Encounter: Payer: Medicare Other | Admitting: Nutrition

## 2011-08-07 ENCOUNTER — Other Ambulatory Visit (HOSPITAL_BASED_OUTPATIENT_CLINIC_OR_DEPARTMENT_OTHER): Payer: Medicare Other | Admitting: Lab

## 2011-08-07 VITALS — BP 120/75 | HR 74 | Temp 98.9°F | Ht 69.0 in | Wt 172.2 lb

## 2011-08-07 DIAGNOSIS — C109 Malignant neoplasm of oropharynx, unspecified: Secondary | ICD-10-CM

## 2011-08-07 LAB — CBC WITH DIFFERENTIAL/PLATELET
BASO%: 0.2 % (ref 0.0–2.0)
Basophils Absolute: 0 10*3/uL (ref 0.0–0.1)
EOS%: 0.2 % (ref 0.0–7.0)
Eosinophils Absolute: 0 10*3/uL (ref 0.0–0.5)
HCT: 30.3 % — ABNORMAL LOW (ref 38.4–49.9)
HGB: 10.3 g/dL — ABNORMAL LOW (ref 13.0–17.1)
LYMPH%: 10.9 % — ABNORMAL LOW (ref 14.0–49.0)
MCH: 32.8 pg (ref 27.2–33.4)
MCHC: 34.2 g/dL (ref 32.0–36.0)
MCV: 96.2 fL (ref 79.3–98.0)
MONO#: 0.3 10*3/uL (ref 0.1–0.9)
MONO%: 15.1 % — ABNORMAL HIGH (ref 0.0–14.0)
NEUT#: 1.5 10*3/uL (ref 1.5–6.5)
NEUT%: 73.6 % (ref 39.0–75.0)
Platelets: 249 10*3/uL (ref 140–400)
RBC: 3.15 10*6/uL — ABNORMAL LOW (ref 4.20–5.82)
RDW: 18.3 % — ABNORMAL HIGH (ref 11.0–14.6)
WBC: 2 10*3/uL — ABNORMAL LOW (ref 4.0–10.3)
lymph#: 0.2 10*3/uL — ABNORMAL LOW (ref 0.9–3.3)

## 2011-08-07 LAB — COMPREHENSIVE METABOLIC PANEL
ALT: 10 U/L (ref 0–53)
AST: 11 U/L (ref 0–37)
Albumin: 3.3 g/dL — ABNORMAL LOW (ref 3.5–5.2)
Alkaline Phosphatase: 78 U/L (ref 39–117)
BUN: 20 mg/dL (ref 6–23)
CO2: 29 mEq/L (ref 19–32)
Calcium: 8.7 mg/dL (ref 8.4–10.5)
Chloride: 99 mEq/L (ref 96–112)
Creatinine, Ser: 0.85 mg/dL (ref 0.50–1.35)
Glucose, Bld: 187 mg/dL — ABNORMAL HIGH (ref 70–99)
Potassium: 4.4 mEq/L (ref 3.5–5.3)
Sodium: 135 mEq/L (ref 135–145)
Total Bilirubin: 0.4 mg/dL (ref 0.3–1.2)
Total Protein: 5.8 g/dL — ABNORMAL LOW (ref 6.0–8.3)

## 2011-08-07 MED ORDER — HYDROCODONE-ACETAMINOPHEN 7.5-500 MG/15ML PO SOLN: 15.0000 mL | Freq: Four times a day (QID) | ORAL | Status: AC | PRN

## 2011-08-07 NOTE — Progress Notes (Signed)
[facility] OFFICE PROGRESS NOTE  No primary provider on file.  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   PAST THERAPY: concurrent chemoradiation therapy with weekly cisplatin and daily radiation therapy between June 18, 2011 and August 03, 2011.  CURRENT THERAPY: watchful observation  INTERVAL HISTORY: Jason Noble 70 y.o. male returns for regular follow up.  Since finishing up radiation, his skin burn in his neck has healed up much better.  He denies pain in the neck.  He however still has mucositis requiring pain med.  Mucositis pain is ranked up to 8/10 and decrease to 4-5/10 with pain meds Fentanyl 55mcg/hr change q3days.  He used to take morphine sulfate immediate release in the form of Roxanol.  However, this made him constipated and too drowsy.  Therefore, he switched back to Vicodin elixir instead.  He still has lots of xerostomia and thick phlegm.  He accidentally bit his tongue few days ago, and cannot tolerate diluted hydrogen peroxide.  He did not like the continuous tube feed since the pump continues to beep from malfunction.  He switched back to bolus tube feed yesterday without nausea/vomiting.   He has not been able to tolerate any oral intake except for few sips of water due to mucositis pain.  He denies PEG tube erythema/purulent discharge/or pain.  He is able to walk around his property during the day, however, much much further since he has so much thick phlegm to leave the house.  He denies fever/HA/ tinnitus/ abd pain/ bleeding sx/ depression/ suicidal or homocidal ideation.    MEDICAL HISTORY: Past Medical History  Diagnosis Date  . Cholecystitis   . Abscess of gallbladder   . CVA (cerebral infarction) 2007  . 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 Skin cancer     basal cell, squamous cell  . Prostate cancer 2002  . Basal cell carcinoma of skin   . Squamous cell  carcinoma of skin   . Myocardial infarction     approx. 2--8  . Coronary heart disease     has a stent    SURGICAL HISTORY:  Past Surgical History  Procedure Date  . Prostatectomy   . Peg tube placement 06/13/11 at [facility]  . Coronary artery bypass graft 1991  . Cholecystectomy [id number]    3 surgeries involved, laparotomy  . Tonsillectomy   . Wrist surgery approx. 39 yrs ago    removal left radial head  . Multiple tooth extractions     prior to radiation    MEDICATIONS: Current Outpatient Prescriptions  Medication Sig Dispense Refill  . antiseptic oral rinse (BIOTENE) LIQD 15 mLs by Mouth Rinse route 2 times daily at 12 noon and 4 pm.  500 mL  3  . fentaNYL (DURAGESIC - DOSED MCG/HR) 25 MCG/HR Place 1 patch (25 mcg total) onto the skin every 3 (three) days.  10 patch  0  . hydrogen peroxide 3 % external solution Apply topically QID. Mix with clean water; 1 part hydrogen peroxide with 4 parts water; gargle and spit every 6 hours for thick phlegm.  500 mL  3  . Jevity 1.5 Cal (JEVITY 1.5) LIQD Alternate with Osmolite 1.5 EVERY 12 HOURS AND CHANGE TUBING EVERY 24 HOURS.  Rate at 60mL continuously.   Add Free water 250 mL via PEG tube 5 times daily.  948 mL  30  . lidocaine (XYLOCAINE) 2 %  solution Take 20 mLs by mouth 4 (four) times daily as needed for pain (swish/gargle/and spit prn pain).  500 mL  0  . LORazepam (ATIVAN) 0.5 MG tablet Take 0.5 mg by mouth every 6 (six) hours as needed.       . Nutritional Supplements (FEEDING SUPPLEMENT, OSMOLITE 1.5 CAL,) LIQD Place 1,000 mLs into feeding tube continuous. Alternate with Jevity 1.5 EVERY 12 HOURS AND CHANGE TUBING EVERY 24 HOURS.  Rate at 60mL continuously.   Add Free water 250 mL via PEG tube 5 times daily.  1000 mL  30  . ondansetron (ZOFRAN) 8 MG tablet Take 8 mg by mouth every 12 (twelve) hours as needed.       . prochlorperazine (COMPAZINE) 10 MG tablet Take 10 mg by mouth every 6 (six) hours as needed.       . promethazine  (PHENERGAN) 25 MG suppository INSERT 1 SUPPOSITORY RECTALLY EVERY 6 HOURS AS NEEDED FOR NAUSEA  12 suppository  0  . senna-docusate (SENOKOT-S) 8.6-50 MG per tablet Place 2 tablets into feeding tube 2 (two) times daily as needed for constipation.  30 tablet  3  . aspirin EC 325 MG EC tablet Take 325 mg by mouth daily.        Marland Kitchen atorvastatin (LIPITOR) 40 MG tablet Take 40 mg by mouth daily.        Marland Kitchen HYDROcodone-acetaminophen (LORTAB) 7.5-500 MG/15ML solution Take 15 mLs by mouth every 6 (six) hours as needed for pain.  500 mL  0  . PRESCRIPTION MEDICATION Take 5 mLs by mouth every 4 (four) hours as needed. Q-Dryl/Nystat/Almaco/Distille--Swish and swallow 1 teaspoonful (5 m'ls) every 4 hours as needed for mouth soreness        No current facility-administered medications for this visit.   Facility-Administered Medications Ordered in Other Visits  Medication Dose Route Frequency Provider Last Rate Last Dose  . topical emolient (BIAFINE) emulsion   Topical BID Jonna Coup, MD      . DISCONTD: topical emolient (BIAFINE) emulsion   Topical PRN Lurline Hare, MD        ALLERGIES:   has no allergies on file.  REVIEW OF SYSTEMS:  The rest of the 14-point review of system was negative.   Filed Vitals:   08/07/11 1023  BP: 120/75  Pulse: 74  Temp: 98.9 F (37.2 C)   Wt Readings from Last 3 Encounters:  08/07/11 172 lb 3.2 oz (78.109 kg)  08/03/11 177 lb 14.4 oz (80.695 kg)  07/26/11 177 lb 7.5 oz (80.5 kg)   ECOG Performance status: 1  PHYSICAL EXAMINATION:   General:  well-nourished in no acute distress.  Eyes:  no scleral icterus.  ENT:  There were no oropharyngeal lesions except for erythema from recent radiation.  Neck was without thyromegaly.  Lymphatics:  Positive for a 1x1cm left level 2 node.  Negative for supraclavicular or axillary adenopathy.  Respiratory: lungs were clear bilaterally without wheezing or crackles.  Cardiovascular:  Regular rate and rhythm, S1/S2, without  murmur, rub or gallop.  There was no pedal edema.  GI:  abdomen was soft, flat, nontender, nondistended, without organomegaly.  PEG tube was dry/clean/intact.  Muscoloskeletal:  no spinal tenderness of palpation of vertebral spine.  Skin exam was without echymosis, petichae. There was erythema in his neck without skin breakthrough or purulent discharge.  Neuro exam was nonfocal.  Patient was able to get on and off exam table without assistance.  Gait was normal.  Patient was alerted and  oriented.  Attention was good.   Language was appropriate.  Mood was normal without depression.  Speech was not pressured.  Thought content was not tangential.     LABORATORY/RADIOLOGY DATA:  Lab Results  Component Value Date   WBC 2.0* 08/07/2011   HGB 10.3* 08/07/2011   HCT 30.3* 08/07/2011   PLT 249 08/07/2011   GLUCOSE 106* 07/30/2011   CHOL 182 11/18/2008   TRIG 81 11/18/2008   HDL 58.4 11/18/2008   LDLCALC 107* 11/18/2008   ALT 11 07/30/2011   AST 12 07/30/2011   NA 134* 07/30/2011   K 3.7 07/30/2011   CL 101 07/30/2011   CREATININE 0.87 07/30/2011   BUN 18 07/30/2011   CO2 28 07/30/2011   INR 1.03 06/13/2011   HGBA1C  Value: 6.0 (NOTE)                                                                       According to the ADA Clinical Practice Recommendations for 2011, when HbA1c is used as a screening test:   >=6.5%   Diagnostic of Diabetes Mellitus           (if abnormal result  is confirmed)  5.7-6.4%   Increased risk of developing Diabetes Mellitus  References:Diagnosis and Classification of Diabetes Mellitus,Diabetes Care,2011,34(Suppl 1):S62-S69 and Standards of Medical Care in         Diabetes - 2011,Diabetes Care,2011,34  (Suppl 1):S11-S61.* 04/27/2010    ASSESSMENT AND PLAN:   1. Oropharynx cancer. I discussed with Mr. Ewy that he had finished therapy and is recovering as expected.  He still has grade 1-2  fatigue, grade 2-3 mucositis, and grade 1-2 skin changes.  However, he is recovering  well.     2. Mucositis pain from chemoradiation therapy. I advised him to continue fentanyl patch at 25 mcg/hr and reserve Vicodin for  breakthrough pain. When he comes in next week for refill of fentanyl patch, we will decrease this dose to 24mcg/hr. He and his wife wanted to be off of pain med as quick as possible.  However, I advised them that he goal at this time is to tolerate PO intake before being off of pain med completely.  3. Constipation from pain medication. I advised them again on stool softeners and laxatives.  4. Xerostomia and thick phlegm, I advised him to do upwards of 3-4 times a day using 1:1 viscous lidocaine and robitussin.  He should avoid diluted hydrogen peroxide until his tongue ulcer heals up.  5. Grade 2 calorie-protein malnutrition: He does not like the continuous PEG tube feeding.  I talked with Vernell Leep, RD to discuss with him options for bolus in addition to instruction on free fluid at least 5x/daily.   6. History of coronary artery disease. Status post bypass long time ago and one episode of in-graft stenosis with stent placement. Since 2008, he has been stable, no CHF. He is on aspirin, statin and fish oil. I defer to his cardiologist for management.   7. History of prostate cancer in 2002. He is under care of his urologist and is reportedly in NED.   8. History of smoking for 5 years and quit in 1967. He is not smoking now.  9.  Follow up:  With me in about 1 month to ensure that he is recovering on the right track.    Total length of time of the face-to-face encounter was 30 minutes.  More than 50% of time was spent in counseling and coordination of care.

## 2011-08-07 NOTE — Progress Notes (Signed)
I spoke with Mr. and Mrs. Ratledge today regarding patient's tube feeding since discharge from the hospital.  The patient reports that he has converted back to very small bolus feedings of 4 ounces of tube feeding plus 4 ounces of water every hour.  He is consuming approximately 6 cans of formula a day, half being Jevity 1.5, half being Osmolite 1.5.  He is tolerating bolus feedings well.  He continues to cough up phlegm and mucus, however, he denies vomiting tube feeding. His weight has decreased to 172.2 pounds from 182.3 pounds on October 29.  The patient currently is tolerating approximately 2130 calories from his tube feedings which is about 91% of his minimal caloric needs. The patient understands that he really needs to get in a seventh can of formula a day and he is agreeable to working on this.  In addition, the patient's wife is wondering if the patient could try Juven, which is a supplement containing Revigor, Glutamine and Arginine.  I have some samples for the patient to try.  I have informed him he needs to mix this with 8 ounces of water b.i.d. to put through his feeding tube.  This is not in place of his enteral feeding.  DIAGNOSIS:  Inadequate oral intake continues.  INTERVENTION:  The patient will attempt to increase total enteral nutrition to 7 cans a day to minimize further weight loss.  He will continue free water flushes as prescribed by his physician of approximately 1250 mL daily.  NEXT VISIT:  Tuesday, December 18.    ______________________________ Zenovia Jarred, RD, LDN Clinical Nutrition Specialist BN/MEDQ  D:  08/07/2011  T:  08/07/2011  Job:  [id number]

## 2011-08-13 ENCOUNTER — Other Ambulatory Visit: Payer: Self-pay | Admitting: Oncology

## 2011-08-13 ENCOUNTER — Encounter: Payer: Self-pay | Admitting: Nutrition

## 2011-08-14 NOTE — Progress Notes (Signed)
The patient's wife called me on the telephone today requesting information on receiving more tube feeding.  I also received an e-mail from the patient himself to inform me that he is tolerating bolus tube feedings of 1 can of formula at a time.  He alternates between Jevity 1.5 and Osmolite 1.5 with 8 ounces of water mixed in.  He is doing 7 cans of formula daily.  He is also using 2 packets of Juven b.i.d. through his feeding tube.  The patient has good tolerance of these products.  He is just hoping to be sure that his Home [facility] agency delivers some additional formula so that he does not run out.  NUTRITION DIAGNOSIS:  Inadequate oral intake continues.  INTERVENTION:  The patient will tolerate 7 cans of Osmolite 1.5 and Jevity 1.5 daily to promote weight maintenance.  NEXT VISIT:  Tuesday, December 18th.    ______________________________ Zenovia Jarred, RD, LDN Clinical Nutrition Specialist BN/MEDQ  D:  08/13/2011  T:  08/13/2011  Job:  [id number]

## 2011-08-21 ENCOUNTER — Telehealth: Payer: Self-pay | Admitting: *Deleted

## 2011-08-21 NOTE — Telephone Encounter (Signed)
Pt called, reporting the back of his throat still very sore and "feels burned"  2 1/2 weeks after last XRT.  Pt denies any fevers over 98.94F and denies pain/throat getting any worse.  He voices concern that it doesn't feel much better several weeks post XRT.  Explained to pt this is normal and it takes many months to recover from the side effects produced by XRT on the throat/mouth.  Reviewed using Robitussin/viscous lidocaine, MMW and Hydrogen Peroxide for comfort and to help decrease thick secretions. Instructed to use Lortab elixir as needed which pt states using about twice daily.  Instructed pt to call back if symptoms worsen or any fevers over 100.60F.   Listened to pt and provided support assuring him he is experiencing normal side effects at this point after his treatment.   Pt verbalized understanding.

## 2011-08-27 ENCOUNTER — Other Ambulatory Visit: Payer: Self-pay | Admitting: *Deleted

## 2011-08-27 DIAGNOSIS — R52 Pain, unspecified: Secondary | ICD-10-CM

## 2011-08-27 MED ORDER — HYDROCODONE-ACETAMINOPHEN 7.5-500 MG/15ML PO SOLN: 15.0000 mL | Freq: Four times a day (QID) | ORAL | Status: AC | PRN

## 2011-08-27 NOTE — Telephone Encounter (Signed)
Wife called for refill on pt's lortab elixir.   She reports pt continues to have pain, but his worst discomfort comes from dry mouth and thick secretions.  She also reports temps of 99.33F.   Instructed wife to call if temp over 100.86F,  Productive cough colored sputum, or any other changes.  Explained pts symptoms are normal post XRT.  Reviewed mouth rinses w/ robitussin/lidocaine,  Hydrogen peroxide/water and baking soda and water along w/ MMW PRN.  She verbalized understanding.  Called Lortab elixir into [facility] on [address].

## 2011-09-04 ENCOUNTER — Other Ambulatory Visit (HOSPITAL_BASED_OUTPATIENT_CLINIC_OR_DEPARTMENT_OTHER): Payer: Medicare Other | Admitting: Lab

## 2011-09-04 ENCOUNTER — Other Ambulatory Visit: Payer: Self-pay

## 2011-09-04 ENCOUNTER — Ambulatory Visit (HOSPITAL_BASED_OUTPATIENT_CLINIC_OR_DEPARTMENT_OTHER): Payer: Medicare Other | Admitting: Oncology

## 2011-09-04 ENCOUNTER — Ambulatory Visit: Payer: Medicare Other | Admitting: Nutrition

## 2011-09-04 ENCOUNTER — Telehealth: Payer: Self-pay | Admitting: Oncology

## 2011-09-04 VITALS — BP 108/65 | HR 71 | Temp 98.1°F | Ht 69.0 in | Wt 176.4 lb

## 2011-09-04 DIAGNOSIS — E039 Hypothyroidism, unspecified: Secondary | ICD-10-CM

## 2011-09-04 DIAGNOSIS — C109 Malignant neoplasm of oropharynx, unspecified: Secondary | ICD-10-CM

## 2011-09-04 LAB — CBC WITH DIFFERENTIAL/PLATELET
BASO%: 0.1 % (ref 0.0–2.0)
Basophils Absolute: 0 10*3/uL (ref 0.0–0.1)
EOS%: 1.1 % (ref 0.0–7.0)
Eosinophils Absolute: 0.1 10*3/uL (ref 0.0–0.5)
HCT: 36.8 % — ABNORMAL LOW (ref 38.4–49.9)
HGB: 12.4 g/dL — ABNORMAL LOW (ref 13.0–17.1)
LYMPH%: 7.3 % — ABNORMAL LOW (ref 14.0–49.0)
MCH: 32.9 pg (ref 27.2–33.4)
MCHC: 33.8 g/dL (ref 32.0–36.0)
MCV: 97.4 fL (ref 79.3–98.0)
MONO#: 0.5 10*3/uL (ref 0.1–0.9)
MONO%: 8.4 % (ref 0.0–14.0)
NEUT#: 4.5 10*3/uL (ref 1.5–6.5)
NEUT%: 83.1 % — ABNORMAL HIGH (ref 39.0–75.0)
Platelets: 237 10*3/uL (ref 140–400)
RBC: 3.78 10*6/uL — ABNORMAL LOW (ref 4.20–5.82)
RDW: 17.4 % — ABNORMAL HIGH (ref 11.0–14.6)
WBC: 5.4 10*3/uL (ref 4.0–10.3)
lymph#: 0.4 10*3/uL — ABNORMAL LOW (ref 0.9–3.3)

## 2011-09-04 LAB — BASIC METABOLIC PANEL
BUN: 29 mg/dL — ABNORMAL HIGH (ref 6–23)
CO2: 28 mEq/L (ref 19–32)
Calcium: 9.1 mg/dL (ref 8.4–10.5)
Chloride: 104 mEq/L (ref 96–112)
Creatinine, Ser: 0.86 mg/dL (ref 0.50–1.35)
Glucose, Bld: 134 mg/dL — ABNORMAL HIGH (ref 70–99)
Potassium: 4.2 mEq/L (ref 3.5–5.3)
Sodium: 137 mEq/L (ref 135–145)

## 2011-09-04 NOTE — Progress Notes (Signed)
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   PAST THERAPY: concurrent chemoradiation therapy with weekly cisplatin and daily radiation therapy between June 18, 2011 and August 03, 2011.  CURRENT THERAPY: watchful observation  INTERVAL HISTORY: Jason Noble 70 y.o. male returns for regular follow up with his wife.  He is improving per his report.  He has been able to eat applesauce, scrambled eggs, chicken soup. He still uses about 7 cans of Jevity and Osmolite he is feeding tube every day. He does this by gravity.  He does not like to use the pump anymore.  He still has some xerostomia however he thinks his saliva is coming back slowly. His taste is still abnormal however much better than a month ago. He still is able to feel a very light small residual right cervical level II lymph node. His skin on the neck has completely healed compared to a month ago. His strength is improved as well and he is able to perform chores in the house and is totally independent of activities of daily living.  He denies any PEG tube erythema or purulent discharge or pain.  He no longer requires fentanyl pain patch.  He only uses Vicodin sparingly maybe once or twice a day at most. He also has relief with Tylenol for pain. The pain is mild and moderate in the throat when he swallows. He does not always require pain medication before he swallows.  Patient denies fatigue, headache, visual changes, confusion, drenching night sweats, mucositis, odynophagia, dysphagia, nausea vomiting, jaundice, chest pain, palpitation, shortness of breath, dyspnea on exertion, productive cough, gum bleeding, epistaxis, hematemesis, hemoptysis, abdominal pain, abdominal swelling, early satiety, melena, hematochezia, hematuria, skin rash, spontaneous bleeding, joint swelling, joint pain, heat or cold intolerance,  bowel bladder incontinence, back pain, focal motor weakness, paresthesia, depression, suicidal or homocidal ideation, feeling hopelessness.      MEDICAL HISTORY: Past Medical History  Diagnosis Date  . Cholecystitis   . Abscess of gallbladder   . CVA (cerebral infarction) 2007  . 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 Skin cancer     basal cell, squamous cell  . Prostate cancer 2002  . Basal cell carcinoma of skin   . Squamous cell carcinoma of skin   . Myocardial infarction     approx. 2--8  . Coronary heart disease     has a stent    SURGICAL HISTORY:  Past Surgical History  Procedure Date  . Prostatectomy   . Peg tube placement 06/13/11 at [facility] IR  . Coronary artery bypass graft 1991  . Cholecystectomy [id number]    3 surgeries involved, laparotomy  . Tonsillectomy   . Wrist surgery approx. 39 yrs ago    removal left radial head  . Multiple tooth extractions     prior to radiation    MEDICATIONS: Current Outpatient Prescriptions  Medication Sig Dispense Refill  . antiseptic oral rinse (BIOTENE) LIQD 15 mLs by Mouth Rinse route 2 times daily at 12 noon and 4 pm.  500 mL  3  . aspirin EC 325 MG EC tablet Take 325 mg by mouth daily.        Marland Kitchen atorvastatin (LIPITOR) 40 MG tablet Take 40 mg by mouth daily.        Marland Kitchen HYDROcodone-acetaminophen (LORTAB) 7.5-500 MG/15ML solution Take 15 mLs by mouth every 6 (six) hours  as needed for pain.  480 mL  0  . hydrogen peroxide 3 % external solution Apply topically QID. Mix with clean water; 1 part hydrogen peroxide with 4 parts water; gargle and spit every 6 hours for thick phlegm.  500 mL  3  . Jevity 1.5 Cal (JEVITY 1.5) LIQD Alternate with Osmolite 1.5 EVERY 12 HOURS AND CHANGE TUBING EVERY 24 HOURS.  Rate at 60mL continuously.   Add Free water 250 mL via PEG tube 5 times daily.  948 mL  30  . lactulose (CHRONULAC) 10 GM/15ML solution TAKE 45 MLS (3 TABLESPOONSFUL) BY MOUTH EVERY 3 DAYS AS NEEDED.  240 mL  0  . LORazepam (ATIVAN)  0.5 MG tablet Take 0.5 mg by mouth every 6 (six) hours as needed.       . Nutritional Supplements (FEEDING SUPPLEMENT, OSMOLITE 1.5 CAL,) LIQD Place 1,000 mLs into feeding tube continuous. Alternate with Jevity 1.5 EVERY 12 HOURS AND CHANGE TUBING EVERY 24 HOURS.  Rate at 60mL continuously.   Add Free water 250 mL via PEG tube 5 times daily.  1000 mL  30  . ondansetron (ZOFRAN) 8 MG tablet Take 8 mg by mouth every 12 (twelve) hours as needed.       Marland Kitchen PRESCRIPTION MEDICATION Take 5 mLs by mouth every 4 (four) hours as needed. Q-Dryl/Nystat/Almaco/Distille--Swish and swallow 1 teaspoonful (5 m'ls) every 4 hours as needed for mouth soreness       . prochlorperazine (COMPAZINE) 10 MG tablet Take 10 mg by mouth every 6 (six) hours as needed.       . promethazine (PHENERGAN) 25 MG suppository INSERT 1 SUPPOSITORY RECTALLY EVERY 6 HOURS AS NEEDED FOR NAUSEA  12 suppository  0  . senna-docusate (SENOKOT-S) 8.6-50 MG per tablet Place 2 tablets into feeding tube 2 (two) times daily as needed for constipation.  30 tablet  3   No current facility-administered medications for this visit.   Facility-Administered Medications Ordered in Other Visits  Medication Dose Route Frequency Provider Last Rate Last Dose  . topical emolient (BIAFINE) emulsion   Topical BID Jonna Coup, MD        ALLERGIES:   has no allergies on file.  REVIEW OF SYSTEMS:  The rest of the 14-point review of system was negative.   Filed Vitals:   09/04/11 0859  BP: 108/65  Pulse: 71  Temp: 98.1 F (36.7 C)   Wt Readings from Last 3 Encounters:  09/04/11 176 lb 6.4 oz (80.015 kg)  08/07/11 172 lb 3.2 oz (78.109 kg)  08/03/11 177 lb 14.4 oz (80.695 kg)   ECOG Performance status: 1  PHYSICAL EXAMINATION:   General:  well-nourished in no acute distress.  Eyes:  no scleral icterus.  ENT:  There were no oropharyngeal lesions except for erythema from recent radiation.  Neck was without thyromegaly.  Lymphatics:  Positive for a  1x1cm right level 2 node.  Negative for supraclavicular or axillary adenopathy.  Respiratory: lungs were clear bilaterally without wheezing or crackles.  Cardiovascular:  Regular rate and rhythm, S1/S2, without murmur, rub or gallop.  There was no pedal edema.  GI:  abdomen was soft, flat, nontender, nondistended, without organomegaly.  PEG tube was dry/clean/intact.  Muscoloskeletal:  no spinal tenderness of palpation of vertebral spine.  Skin exam was without echymosis, petichae. There was erythema in his neck without skin breakthrough or purulent discharge.  Neuro exam was nonfocal.  Patient was able to get on and off exam table without assistance.  Gait was normal.  Patient was alerted and oriented.  Attention was good.   Language was appropriate.  Mood was normal without depression.  Speech was not pressured.  Thought content was not tangential.     LABORATORY/RADIOLOGY DATA: WBC 5.4; Hgb 12.4; Plt 237.  ASSESSMENT AND PLAN:   1. Oropharynx cancer. I discussed with Mr. Alcocer that he had finished therapy and is recovering very well.  He has a residual about 1 x 1 cm right cervical level II lymph node.  I discussed with him that this may be negative on a PET scan. As he is further from radiation therapy, this lymph node may shrink even further.  I went ahead and ordered a PET scan to be performed in mid February 2013 to assess response to chemotherapy ration therapy.    2. Mucositis pain from chemoradiation therapy. This is minimal now. He would like to be off of Vicodin and only take Tylenol.  3. Constipation from pain medication. I advised them again on stool softeners and OTC laxatives as supposed to lactulose.Marland Kitchen  4. Xerostomia and thick phlegm:  Improved from before. He continues to do mouth rinses when necessary.  5. Mild calorie-protein malnutrition: He is able to take oral intake with soft food. I advised him to talk with Vernell Leep today to help him in transition from the PEG tube to more  substantive oral intake.  6. History of coronary artery disease. Status post bypass long time ago and one episode of in-graft stenosis with stent placement. Since 2008, he has been stable, no CHF. He is on aspirin, statin and fish oil. I defer to his cardiologist for management.   7. History of prostate cancer in 2002. He is under care of his urologist and is reportedly in NED.   8. History of smoking for 5 years and quit in 1967. He is not smoking now.   9.  Leukocytopenia and anemia: These were from chemotherapy. These have resolved.   10.  Follow up:  With me in February 2013 the day after the PET scan.   Total length of time of the face-to-face encounter was 30 minutes.  More than 50% of time was spent in counseling and coordination of care.

## 2011-09-04 NOTE — Telephone Encounter (Signed)
gve the pt his feb 2013 appt calendar along with the pet scan appt

## 2011-09-04 NOTE — Progress Notes (Signed)
The patient presents to nutrition followup.  Weight was documented as 176.4 pounds which is decreased from about 182 pounds back at the end of October.  The patient continues to use 7 cans of tube feeding daily mixing Jevity 1.5 and Osmolite 1.5.  He reports he generally uses 4 cans of Jevity 1.5 and 3 cans of Osmolite 1.5.  He is generally mixing about 8 ounces of water with 1 can of tube feeding and he prefers to do 1 can every couple hours so that he is feeding 7 times a day.  He is beginning to consume some very soft moist foods.  He reports can drink Ensure Plus, however, he just has not done so yet.  While his mouth has started to heal, he still has taste alterations and does not enjoy eating.  NUTRITION DIAGNOSIS:  Inadequate oral intake continues.  INTERVENTION:  I have educated the patient on soft moist foods to begin to incorporate into his daily meals.  I have encouraged him to drink two Ensure Plus in place of 2 cans of enteral product a day to begin this week.  He will increase by 1 can of Ensure Plus by mouth and decrease one can of enteral product every week until he is no longer using his tube for nutrition support.  The patient is extremely good at documenting his oral intake, so I have explained to him as he is able to increase his oral intake of calories and protein  foods, he can then decrease his intake of Ensure Plus by mouth.  The patient verbalizes understanding and willingness to comply.    MONITORING EVALUATION/GOALS:  He will increase his oral intake and decrease his tube feeding to minimize weight loss.    NEXT VISIT:  Monday, February 18th.    ______________________________ Zenovia Jarred, RD, LDN Clinical Nutrition Specialist BN/MEDQ  D:  09/04/2011  T:  09/04/2011  Job:  [id number]

## 2011-09-05 ENCOUNTER — Other Ambulatory Visit: Payer: Self-pay | Admitting: Oncology

## 2011-09-20 ENCOUNTER — Ambulatory Visit (HOSPITAL_COMMUNITY): Payer: Self-pay | Admitting: Dentistry

## 2011-09-20 ENCOUNTER — Encounter (HOSPITAL_COMMUNITY): Payer: Self-pay | Admitting: Dentistry

## 2011-09-20 VITALS — BP 132/66 | HR 72 | Temp 99.0°F

## 2011-09-20 DIAGNOSIS — R432 Parageusia: Secondary | ICD-10-CM

## 2011-09-20 DIAGNOSIS — Z85810 Personal history of malignant neoplasm of tongue: Secondary | ICD-10-CM | POA: Diagnosis not present

## 2011-09-20 DIAGNOSIS — Z09 Encounter for follow-up examination after completed treatment for conditions other than malignant neoplasm: Secondary | ICD-10-CM | POA: Diagnosis not present

## 2011-09-20 DIAGNOSIS — K117 Disturbances of salivary secretion: Secondary | ICD-10-CM

## 2011-09-20 DIAGNOSIS — R1310 Dysphagia, unspecified: Secondary | ICD-10-CM

## 2011-09-20 MED ORDER — SODIUM FLUORIDE 1.1 % DT PSTE: PASTE | DENTAL | Status: DC

## 2011-09-20 NOTE — Progress Notes (Signed)
Thursday, September 20, 2011  BP: 132/66     P: 72 T       : 99.0            Wgt: 170 now; 182 lbs. to start treatment.  Jason Noble is a 71 year old male previously diagnosed with squamous cell carcinoma of the right base of tongue. Patient referred to Dr. Cherly Anderson for the extraction of tooth numbers 1, 2, 15, 18, 30, 31 and 32 with alveoloplasty.  Patient then underwent chemoradiation therapy with Dr. Mitzi Hansen and Dr. Gaylyn Rong. Patient had chemoradiation treatments from 06/18/2011 through 08/03/2011.  Patient now presents for periodic oral examination after radiation therapy.  REVIEW OF CHIEF COMPLAINTS: DRY MOUTH: Yes HARD TO SWALLOW: Yes  HURT TO SWALLOW: Yes, still has some pain. TASTE CHANGES: Taste is coming back. Can taste some things. SORES IN MOUTH: No TRISMUS: Having no problems with trismus exercises. SYMPTOM RELIEF:  Using salt water and baking soda mouth rinses and Biotene Rinses. HOME Oral Hygiene:  BRUSHING: After every anything FLOSSING: 2 - 3 X a day RINSING: Baking soda and salt water. Sometimes peroxide and water, and Biotene. FLUORIDE: Not doing it consistently, but will start again today. TRISMUS EXERCISES: Maximum interincisal opening :  55 mm   DENTAL EXAM: Oral hygiene (PLAQUE):  Minimal. Good oral hygiene. LOCATION OF MUCOSITIS: Back of throat. DESCRIPTION OF SALIVA:Decreased. Moderate xerostomia with foamy saliva. ANY EXPOSED BONE: none noted. OTHER WATCHED AREAS: #32 extractions site. DX:   1. Xerostomia  2. Dysgeusia 3. Dysphagia 4. Odynophagia 5. Mucositis  RECOMMENDATIONS: 1. Brush after meals and at bedtime. Use fluoride at bedtime. 2. Use trismus exercises as directed. 3. Use Biotene Rinse or salt water/baking soda rinses. 4. Multiple sips of water as needed. 5. Rx; Prevident 5000 Dry Mouth Use as directed. 3.4 oz. Refill for one year. 6. Release of information filled for Dr. Jonni Sanger. Call for an appointment for March of 2013. Call if  problems before then. Dr. Kristin Bruins

## 2011-09-20 NOTE — Progress Notes (Signed)
CC:   Jason Noble, M.D. Jason H. Pollyann Kennedy, MD  DIAGNOSIS:  Squamous cell carcinoma of the base of tongue.  INDICATIONS FOR THERAPY:  Curative.  TREATMENT DATES:  06/18/2011 to 08/03/2011.  SITE AND DOSE:  Jason Noble was treated on our tomotherapy unit using an IMRT technique with daily image guidance.  He was treated to a dose of 69.96 Gy in 33 fractions at 2.12 Gy per fraction.  The patient did receive treatment corresponding to a simultaneous integrated boost technique in which there was a PTV 6270 and PTV 5610, which corresponded to intermediate and lower risk target volumes.  All of these were treated in 33 fractions as well as well with varying radiation doses on a daily basis.  NARRATIVE:  Jason Noble was able to complete his prescribed course of radiation treatment.  The patient did have substantial difficulty with acute toxicity but he was able to resume his treatment after a short break and was able to finish this.  The patient's treatment corresponded as expected primarily to significant irritation of the mouth and throat along with changes in salvation and significant change in taste.  FOLLOW UP APPOINTMENT:  1 month.    ______________________________ Radene Gunning, M.D., Ph.D. JSM/MEDQ  D:  09/10/2011  T:  09/20/2011  Job:  [id number]

## 2011-09-21 NOTE — Progress Notes (Signed)
See progress note dated 09/20/11. Dr. Kristin Bruins

## 2011-09-27 ENCOUNTER — Telehealth: Payer: Self-pay | Admitting: *Deleted

## 2011-09-27 NOTE — Telephone Encounter (Signed)
Pt left VM stating he continues to have a sore throat and low energy.  He requests to speak w/ RN about his symptoms.   Called pt back and left VM I was returning his call and asked him to call back again when able.

## 2011-09-28 ENCOUNTER — Ambulatory Visit (HOSPITAL_BASED_OUTPATIENT_CLINIC_OR_DEPARTMENT_OTHER): Payer: Medicare Other

## 2011-09-28 ENCOUNTER — Other Ambulatory Visit: Payer: Self-pay | Admitting: Oncology

## 2011-09-28 ENCOUNTER — Telehealth: Payer: Self-pay | Admitting: *Deleted

## 2011-09-28 ENCOUNTER — Other Ambulatory Visit: Payer: Self-pay | Admitting: *Deleted

## 2011-09-28 ENCOUNTER — Ambulatory Visit: Payer: Medicare Other | Admitting: Nutrition

## 2011-09-28 DIAGNOSIS — R5383 Other fatigue: Secondary | ICD-10-CM

## 2011-09-28 DIAGNOSIS — C109 Malignant neoplasm of oropharynx, unspecified: Secondary | ICD-10-CM

## 2011-09-28 DIAGNOSIS — R5381 Other malaise: Secondary | ICD-10-CM | POA: Diagnosis not present

## 2011-09-28 LAB — COMPREHENSIVE METABOLIC PANEL
ALT: 13 U/L (ref 0–53)
AST: 18 U/L (ref 0–37)
Albumin: 4 g/dL (ref 3.5–5.2)
Alkaline Phosphatase: 81 U/L (ref 39–117)
BUN: 25 mg/dL — ABNORMAL HIGH (ref 6–23)
CO2: 26 mEq/L (ref 19–32)
Calcium: 9 mg/dL (ref 8.4–10.5)
Chloride: 105 mEq/L (ref 96–112)
Creatinine, Ser: 0.82 mg/dL (ref 0.50–1.35)
Glucose, Bld: 195 mg/dL — ABNORMAL HIGH (ref 70–99)
Potassium: 4.3 mEq/L (ref 3.5–5.3)
Sodium: 137 mEq/L (ref 135–145)
Total Bilirubin: 0.6 mg/dL (ref 0.3–1.2)
Total Protein: 6.6 g/dL (ref 6.0–8.3)

## 2011-09-28 LAB — CBC WITH DIFFERENTIAL/PLATELET
BASO%: 0.1 % (ref 0.0–2.0)
Basophils Absolute: 0 10*3/uL (ref 0.0–0.1)
EOS%: 2.1 % (ref 0.0–7.0)
Eosinophils Absolute: 0.1 10*3/uL (ref 0.0–0.5)
HCT: 39.3 % (ref 38.4–49.9)
HGB: 13.3 g/dL (ref 13.0–17.1)
LYMPH%: 9.5 % — ABNORMAL LOW (ref 14.0–49.0)
MCH: 32.7 pg (ref 27.2–33.4)
MCHC: 33.8 g/dL (ref 32.0–36.0)
MCV: 96.8 fL (ref 79.3–98.0)
MONO#: 0.4 10*3/uL (ref 0.1–0.9)
MONO%: 7.6 % (ref 0.0–14.0)
NEUT#: 4.3 10*3/uL (ref 1.5–6.5)
NEUT%: 80.7 % — ABNORMAL HIGH (ref 39.0–75.0)
Platelets: 191 10*3/uL (ref 140–400)
RBC: 4.07 10*6/uL — ABNORMAL LOW (ref 4.20–5.82)
RDW: 14.6 % (ref 11.0–14.6)
WBC: 5.4 10*3/uL (ref 4.0–10.3)
lymph#: 0.5 10*3/uL — ABNORMAL LOW (ref 0.9–3.3)

## 2011-09-28 LAB — TSH: TSH: 1.361 u[IU]/mL (ref 0.350–4.500)

## 2011-09-28 NOTE — Telephone Encounter (Signed)
Opened in error

## 2011-09-28 NOTE — Telephone Encounter (Signed)
Spoke with pt on phone at 5 pm yesterday (09/27/11) for aprox 15 minutes.  Pt discussed at length his ongoing difficulty w/ swallowing,  Taste changes and dry mouth.  States all these symptoms have improved over past 2 months,  Have not gotten any worse.  He questions though whether his symptoms are normal for 8 weeks post Radiation Tx?  Pt is taking about 75% of his nutrition by mouth, mostly in the form of protein shakes.  Assured pt his symptoms are normal and recovery can feel slow.  Encouraged pt to try to eat more solid foods,  Soft, moist foods and a variety of foods as tolerated.   Pt also c/o ongoing fatigue and questions whether he should still be feeling so tired?  He feels like he needs to nap at least once a day although fights it sometimes so he can sleep better at night.   Reported pt's symptoms to Dr. Gaylyn Rong and he says pt can have labs checked to make sure pt's thyroid ok and no other underlying cause for pt's fatigue.  Pt states can come in Friday at 11 am for lab work.   POF sent to scheduling for lab add on.

## 2011-09-29 NOTE — Progress Notes (Signed)
Mr. Menken stopped by today to give me a brief followup.  He reports that he has extreme fatigue.  He is beginning to transition off his tube feedings to an oral nutrition supplement by mouth as we had discussed on a previous visit.  Currently he is using 3 cans of a combination of Jevity 1.5 and Osmolite 1.5 via his tube and he is drinking 4 cans of a nutritional supplement that totals approximately 350 calories by mouth. His weight is actually increased slightly to 177.2 pounds.  He reports he does not have much taste yet, however, he thinks it is starting to come back.  His skin is healing and his mouth is beginning to heal as well.  He is requesting coupons for Ensure Plus or Boost.  NUTRITION DIAGNOSIS:  Inadequate oral intake continues.  INTERVENTION:  I have provided the patient with a case of Ensure Plus, as well as coupons for him to purchase product at home.  He will continue to titrate his tube feeding down while increasing his oral intake.  He is to begin some soft moist foods when able.  The patient verbalizes understanding.  MONITORING, EVALUATION, AND GOALS:  The patient has been able to increase oral intake and decrease his tube feedings while maintaining his weight.  NEXT VISIT:  Followup visit scheduled Monday, February 18th.    ______________________________ Zenovia Jarred, RD, LDN Clinical Nutrition Specialist BN/MEDQ  D:  09/28/2011  T:  09/29/2011  Job:  [id number]

## 2011-10-01 ENCOUNTER — Telehealth: Payer: Self-pay | Admitting: *Deleted

## 2011-10-01 NOTE — Telephone Encounter (Signed)
Message copied by Wende Mott on Mon Oct 01, 2011 10:58 AM ------      Message from: Jethro Bolus T      Created: Fri Sep 28, 2011 11:29 PM       Please call pt and inform him that his CBC, renal function, and thyroid function are all normal.  Fatigue from chemradiation can last for more than 2 months after finish of treatment.  If he has any exertional component to his fatigue, can it be angina?  In which case, he needs evaluation from his PCP.  Thanks.

## 2011-10-01 NOTE — Telephone Encounter (Signed)
Called pt and informed him of results of his labs were normal.  Explained fatigue is wnl also.  Asked if he is having any sob or chest pains w/ exertion and he denies this.  Instructed pt to see his PCP if any sob, fatigue or chest pains w/ exertion.  He verbalized understanding.   Pt states traveling in a few weeks and need note for [employer] to bring his boost on flight.  Instructed pt to check w/ [employer] and let us know exactly what note needs to say.  He agreed.

## 2011-10-05 ENCOUNTER — Telehealth: Payer: Self-pay | Admitting: *Deleted

## 2011-10-05 NOTE — Telephone Encounter (Signed)
Call from pt requesting note from Dr. Gaylyn Rong to state that he may bring "Medically Necessary Liquids" with him on board flight.  He is flying out of town in 1 1/2 weeks and needs to bring Boost w/ him to drink as a good portion of his nutrition continues to come from liquid nutritional supplements.  Note left on Dr. Lodema Pilot desk for him to sign.  Informed pt we will call him when it is ready to be picked up.

## 2011-10-10 NOTE — Progress Notes (Unsigned)
Left message on voicemail for patient to pick up note for permission to bring 'medically necessary liquids' on his flight, signed by Dr. Gaylyn Rong.

## 2011-10-11 ENCOUNTER — Encounter: Payer: Self-pay | Admitting: *Deleted

## 2011-10-11 NOTE — Progress Notes (Signed)
Pt came by and picked up signed note from Dr. Gaylyn Rong to be able to bring Medically Necessary liquids (Boost) on board airplane w/ him for upcoming flight to [facility] for son's wedding.

## 2011-10-29 ENCOUNTER — Telehealth: Payer: Self-pay | Admitting: *Deleted

## 2011-10-29 NOTE — Telephone Encounter (Signed)
Pt called requesting PEG tube removal be ordered so if his PET scan this week is ok, then he can have it removed soon.  States PEG removal could just be canceled if his PET is not good.  Pt states he is is eating 100% PO but has lost a few pounds.  Informed pt that Dr. Gaylyn Rong likes to see a good PET scan report and know that pt is eating 100% PO and gaining weight for PEG removal.  Also informed that Dr. Gaylyn Rong prefers order for PEG removal wait until he sees pt on office visit next week.  We don't want to hold a appt in IR if it might be canceled.  Informed that it probably can be done w/i one to two weeks of ordering.  Pt verbalized understanding and agreed to plan.

## 2011-11-01 ENCOUNTER — Encounter (HOSPITAL_COMMUNITY)
Admission: RE | Admit: 2011-11-01 | Discharge: 2011-11-01 | Disposition: A | Discharge status: Home / Self Care | Payer: Medicare Other | Source: Ambulatory Visit | Attending: Oncology | Admitting: Oncology

## 2011-11-01 ENCOUNTER — Encounter (HOSPITAL_COMMUNITY): Payer: Self-pay

## 2011-11-01 ENCOUNTER — Other Ambulatory Visit (HOSPITAL_BASED_OUTPATIENT_CLINIC_OR_DEPARTMENT_OTHER): Payer: Medicare Other | Admitting: Lab

## 2011-11-01 DIAGNOSIS — K4090 Unilateral inguinal hernia, without obstruction or gangrene, not specified as recurrent: Secondary | ICD-10-CM | POA: Diagnosis not present

## 2011-11-01 DIAGNOSIS — E039 Hypothyroidism, unspecified: Secondary | ICD-10-CM

## 2011-11-01 DIAGNOSIS — I719 Aortic aneurysm of unspecified site, without rupture: Secondary | ICD-10-CM | POA: Insufficient documentation

## 2011-11-01 DIAGNOSIS — C069 Malignant neoplasm of mouth, unspecified: Secondary | ICD-10-CM | POA: Diagnosis not present

## 2011-11-01 DIAGNOSIS — Z09 Encounter for follow-up examination after completed treatment for conditions other than malignant neoplasm: Secondary | ICD-10-CM | POA: Diagnosis not present

## 2011-11-01 DIAGNOSIS — K5900 Constipation, unspecified: Secondary | ICD-10-CM | POA: Insufficient documentation

## 2011-11-01 DIAGNOSIS — C109 Malignant neoplasm of oropharynx, unspecified: Secondary | ICD-10-CM

## 2011-11-01 DIAGNOSIS — R5381 Other malaise: Secondary | ICD-10-CM | POA: Diagnosis not present

## 2011-11-01 DIAGNOSIS — M899 Disorder of bone, unspecified: Secondary | ICD-10-CM | POA: Diagnosis not present

## 2011-11-01 LAB — GLUCOSE, CAPILLARY: Glucose-Capillary: 90 mg/dL (ref 70–99)

## 2011-11-01 LAB — CBC WITH DIFFERENTIAL/PLATELET
BASO%: 0.3 % (ref 0.0–2.0)
Basophils Absolute: 0 10*3/uL (ref 0.0–0.1)
EOS%: 2.2 % (ref 0.0–7.0)
Eosinophils Absolute: 0.1 10*3/uL (ref 0.0–0.5)
HCT: 41.6 % (ref 38.4–49.9)
HGB: 14.2 g/dL (ref 13.0–17.1)
LYMPH%: 10.1 % — ABNORMAL LOW (ref 14.0–49.0)
MCH: 32.3 pg (ref 27.2–33.4)
MCHC: 34 g/dL (ref 32.0–36.0)
MCV: 95 fL (ref 79.3–98.0)
MONO#: 0.6 10*3/uL (ref 0.1–0.9)
MONO%: 9.7 % (ref 0.0–14.0)
NEUT#: 4.4 10*3/uL (ref 1.5–6.5)
NEUT%: 77.7 % — ABNORMAL HIGH (ref 39.0–75.0)
Platelets: 198 10*3/uL (ref 140–400)
RBC: 4.38 10*6/uL (ref 4.20–5.82)
RDW: 12.8 % (ref 11.0–14.6)
WBC: 5.7 10*3/uL (ref 4.0–10.3)
lymph#: 0.6 10*3/uL — ABNORMAL LOW (ref 0.9–3.3)

## 2011-11-01 LAB — COMPREHENSIVE METABOLIC PANEL
ALT: 12 U/L (ref 0–53)
AST: 16 U/L (ref 0–37)
Albumin: 3.8 g/dL (ref 3.5–5.2)
Alkaline Phosphatase: 80 U/L (ref 39–117)
BUN: 17 mg/dL (ref 6–23)
CO2: 24 mEq/L (ref 19–32)
Calcium: 9.4 mg/dL (ref 8.4–10.5)
Chloride: 105 mEq/L (ref 96–112)
Creatinine, Ser: 0.9 mg/dL (ref 0.50–1.35)
Glucose, Bld: 88 mg/dL (ref 70–99)
Potassium: 4.3 mEq/L (ref 3.5–5.3)
Sodium: 141 mEq/L (ref 135–145)
Total Bilirubin: 0.7 mg/dL (ref 0.3–1.2)
Total Protein: 6.5 g/dL (ref 6.0–8.3)

## 2011-11-01 LAB — TSH: TSH: 0.881 u[IU]/mL (ref 0.350–4.500)

## 2011-11-01 MED ORDER — FLUDEOXYGLUCOSE F - 18 (FDG) INJECTION
17.2000 | Freq: Once | INTRAVENOUS | Status: AC | PRN
  Administered 2011-11-01: 17.2 via INTRAVENOUS

## 2011-11-05 ENCOUNTER — Telehealth: Payer: Self-pay | Admitting: Oncology

## 2011-11-05 ENCOUNTER — Ambulatory Visit (HOSPITAL_BASED_OUTPATIENT_CLINIC_OR_DEPARTMENT_OTHER): Payer: Medicare Other | Admitting: Oncology

## 2011-11-05 ENCOUNTER — Ambulatory Visit: Payer: Medicare Other | Admitting: Nutrition

## 2011-11-05 VITALS — BP 134/82 | HR 81 | Temp 98.1°F | Ht 69.0 in | Wt 172.2 lb

## 2011-11-05 DIAGNOSIS — C109 Malignant neoplasm of oropharynx, unspecified: Secondary | ICD-10-CM

## 2011-11-05 DIAGNOSIS — R5381 Other malaise: Secondary | ICD-10-CM | POA: Diagnosis not present

## 2011-11-05 DIAGNOSIS — R5383 Other fatigue: Secondary | ICD-10-CM

## 2011-11-05 NOTE — Progress Notes (Signed)
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   PAST THERAPY: concurrent chemoradiation therapy with weekly cisplatin and daily radiation therapy between June 18, 2011 and August 03, 2011.  CURRENT THERAPY: watchful observation  INTERVAL HISTORY: Jason Noble 71 y.o. male returns for regular follow up with his wife.  He reports feeling much better now.  He has not used his PEG tube for more than one month.  He is taking in oral with no food restriction.  He has mild pain in the right jaw when he chews.  He is able to eat given bread, chicken, and beef. He denies any palpable node swelling.  He has good stamina and has been very active around the house.  He does have tinnitus and at time slight hearing loss at high frequency.  He noticed that his voice has got much lower.  He denies SOB, hoarse voice, odynophagia, dysphagia.   Patient denies fatigue, headache, visual changes, confusion, drenching night sweats, palpable lymph node swelling, mucositis, nausea vomiting, jaundice, chest pain, palpitation, shortness of breath, dyspnea on exertion, productive cough, gum bleeding, epistaxis, hematemesis, hemoptysis, abdominal pain, abdominal swelling, early satiety, melena, hematochezia, hematuria, skin rash, spontaneous bleeding, joint swelling, joint pain, heat or cold intolerance, bowel bladder incontinence, back pain, focal motor weakness, paresthesia, depression, suicidal or homocidal ideation, feeling hopelessness.    MEDICAL HISTORY: Past Medical History  Diagnosis Date  . Cholecystitis   . Abscess of gallbladder   . CVA (cerebral infarction) 2007  . 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 Skin cancer     basal cell, squamous cell  . Prostate cancer 2002  . Basal cell carcinoma of skin   . Squamous cell carcinoma of skin   . Myocardial infarction       approx. 2--8  . Coronary heart disease     has a stent    SURGICAL HISTORY:  Past Surgical History  Procedure Date  . Prostatectomy   . Peg tube placement 06/13/11 at [facility]  . Coronary artery bypass graft 1991  . Cholecystectomy [id number]    3 surgeries involved, laparotomy  . Tonsillectomy   . Wrist surgery approx. 39 yrs ago    removal left radial head  . Multiple tooth extractions     prior to radiation    MEDICATIONS: Current Outpatient Prescriptions  Medication Sig Dispense Refill  . antiseptic oral rinse (BIOTENE) LIQD 15 mLs by Mouth Rinse route 2 times daily at 12 noon and 4 pm.  500 mL  3  . aspirin EC 325 MG EC tablet Take 325 mg by mouth daily.        Marland Kitchen atorvastatin (LIPITOR) 40 MG tablet Take 40 mg by mouth daily.        . hydrogen peroxide 3 % external solution Apply topically QID. Mix with clean water; 1 part hydrogen peroxide with 4 parts water; gargle and spit every 6 hours for thick phlegm.  500 mL  3  . Jevity 1.5 Cal (JEVITY 1.5) LIQD Alternate with Osmolite 1.5 EVERY 12 HOURS AND CHANGE TUBING EVERY 24 HOURS.  Rate at 60mL continuously.   Add Free water 250 mL via PEG tube 5 times daily.  948 mL  30  . lactulose (CHRONULAC) 10 GM/15ML solution TAKE 45 MLS (3 TABLESPOONSFUL) BY MOUTH EVERY 3 DAYS AS NEEDED.  240 mL  0  . LORazepam (ATIVAN) 0.5 MG tablet Take 0.5 mg by mouth every 6 (six) hours as needed.       . Nutritional Supplements (FEEDING SUPPLEMENT, OSMOLITE 1.5 CAL,) LIQD Place 1,000 mLs into feeding tube continuous. Alternate with Jevity 1.5 EVERY 12 HOURS AND CHANGE TUBING EVERY 24 HOURS.  Rate at 60mL continuously.   Add Free water 250 mL via PEG tube 5 times daily.  1000 mL  30  . ondansetron (ZOFRAN) 8 MG tablet Take 8 mg by mouth every 12 (twelve) hours as needed.       Marland Kitchen PRESCRIPTION MEDICATION Take 5 mLs by mouth every 4 (four) hours as needed. Q-Dryl/Nystat/Almaco/Distille--Swish and swallow 1 teaspoonful (5 m'ls) every 4 hours as needed for  mouth soreness       . prochlorperazine (COMPAZINE) 10 MG tablet Take 10 mg by mouth every 6 (six) hours as needed.       . promethazine (PHENERGAN) 25 MG suppository INSERT 1 SUPPOSITORY RECTALLY EVERY 6 HOURS AS NEEDED FOR NAUSEA  12 suppository  0  . SENNA S 8.6-50 MG per tablet PLACE 2 TABLETS INTO FEEDING TUBE 2 TIMES DAILY AS NEEDED FOR CONSTIPATION.  100 tablet  0  . Sodium Fluoride (PREVIDENT 5000 DRY MOUTH) 1.1 % PSTE Apply thin ribbon of cream to toothbrush. Brush teeth for 2 minutes at bedtime. Spit out excess. Do not swallow.   1 Bottle  one year   No current facility-administered medications for this visit.   Facility-Administered Medications Ordered in Other Visits  Medication Dose Route Frequency Provider Last Rate Last Dose  . topical emolient (BIAFINE) emulsion   Topical BID Jonna Coup, MD        ALLERGIES:   has no allergies on file.  REVIEW OF SYSTEMS:  The rest of the 14-point review of system was negative.   Filed Vitals:   11/05/11 1034  BP: 134/82  Pulse: 81  Temp: 98.1 F (36.7 C)   Wt Readings from Last 3 Encounters:  11/05/11 172 lb 3.2 oz (78.109 kg)  09/28/11 177 lb 3.2 oz (80.377 kg)  09/04/11 176 lb 6.4 oz (80.015 kg)   ECOG Performance status: 0-1  PHYSICAL EXAMINATION:   General:  well-nourished in no acute distress.  Eyes:  no scleral icterus.  ENT:  There were no oropharyngeal lesion.  I did not see in exposed bone in the gum.  There was no tender to palpation of the right lateral mandible. Neck was without thyromegaly.  Lymphatics: No palpable node.   Respiratory: lungs were clear bilaterally without wheezing or crackles.  Cardiovascular:  Regular rate and rhythm, S1/S2, without murmur, rub or gallop.  There was no pedal edema.  GI:  abdomen was soft, flat, nontender, nondistended, without organomegaly.  PEG tube was dry/clean/intact.  Muscoloskeletal:  no spinal tenderness of palpation of vertebral spine.  Skin exam was without echymosis,  petichae. There was erythema in his neck without skin breakthrough or purulent discharge.  Neuro exam was nonfocal.  Patient was able to get on and off exam table without assistance.  Gait was normal.  Patient was alerted and oriented.  Attention was good.   Language was appropriate.  Mood was normal without depression.  Speech was not pressured.  Thought content was not tangential.     LABORATORY/RADIOLOGY DATA: WBC 5.7; Hgb 14.2; Plt 198. Cr 0.9; LFT within normal limit.  TSH 0.881  IMAGING:   PET scan 11/01/11:  I personally reviewed these images and showed  the pictures to Jason Noble and his wife.  There was no more uptake in right base of tongue or right cervical node.  However, there was uptake in the right lateral aspect of the mandible.    ASSESSMENT AND PLAN:   1. Oropharynx cancer:  No evidence of residual disease.  There is no indication for routine CT neck per NCCN guideline for surveillance of patients with HNSCC.  He does not want routine surveillance CT due to concern for radiation exposure. I stressed the importance of follow up with Rad Onc and ENT where laryngoscopy can be performed for routine surveillance.  I will see him in about 6 months.  2.  Right lateral mandibular pain and uptake on PET scan. Most likely residual uptake from the site of extracted impacted wisdom tooth.  I have low pretest probability for osteoradiation necrosis (ORN).  I personally discussed the case with Dr. Kristin Bruins and requested follow up with him to rule out ORN.   3. Mild calorie-protein malnutrition: He is doing stable on oral intake for more than 1 month.  I referred him to IR for removal of PEG tube.   4. History of coronary artery disease. Status post bypass long time ago and one episode of in-graft stenosis with stent placement. Since 2008, he has been stable, no CHF. He is on aspirin, statin and fish oil. I defer to his cardiologist for management.   5. History of prostate cancer in 2002. He  is under care of his urologist and is reportedly in NED.   6.  History of smoking for 5 years and quit in 1967. He is not smoking now.  I advised him to refrain from smoking, chewing tobacco and heavy EtOH consumption to decrease the risk of recurrent cancer.   10.  Follow up:  With me in about 6 months.

## 2011-11-05 NOTE — Telephone Encounter (Signed)
Gv pt appt for aug2013.  scheduled pt for peg tube removal on 02/21 @ [facility]

## 2011-11-05 NOTE — Progress Notes (Signed)
Mr. Molder returns for nutrition follow-up.  He and his wife were very pleased that his PET scan is clean.  He is having his feeding tube removed in a couple of days.  He has been able to eat 100% of his meals by mouth for the last few weeks, although his weight has decreased to 172 pounds from 177.2 pounds back in January.  He continues to use 3 supplemental shakes daily in addition to a variety of foods and liquids.  NUTRITION DIAGNOSIS:  Inadequate oral intake has resolved.    I am very pleased for the Peschells that he is able to have his feeding tube removed.  I have reinforced the importance of continued adequate intake and encouraged him to call me with any questions or concerns.    ______________________________ Zenovia Jarred, RD, LDN Clinical Nutrition Specialist BN/MEDQ  D:  11/05/2011  T:  11/05/2011  Job:  [id number]

## 2011-11-07 ENCOUNTER — Ambulatory Visit (HOSPITAL_COMMUNITY): Payer: Self-pay | Admitting: Dentistry

## 2011-11-07 VITALS — BP 138/77 | HR 81 | Temp 98.1°F

## 2011-11-07 DIAGNOSIS — K117 Disturbances of salivary secretion: Secondary | ICD-10-CM

## 2011-11-07 DIAGNOSIS — K08199 Complete loss of teeth due to other specified cause, unspecified class: Secondary | ICD-10-CM

## 2011-11-07 DIAGNOSIS — Z85810 Personal history of malignant neoplasm of tongue: Secondary | ICD-10-CM | POA: Diagnosis not present

## 2011-11-07 DIAGNOSIS — Z09 Encounter for follow-up examination after completed treatment for conditions other than malignant neoplasm: Secondary | ICD-10-CM | POA: Diagnosis not present

## 2011-11-07 NOTE — Progress Notes (Signed)
Wednesday, November 07, 2011   BP: 138/77          P:  81        T: 98.1   Jason Noble presents for periodic oral examination and Orthopantogram. Patient recently had PET scan that revealed high SUV reading in the right mandible area. Previous areas of elevated SUV readings in the lymph nodes were no longer noted consistent with response to chemoradiation therapy. 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  Subjective: Patient currently is not complaining of any exposed bone in the lower right mandible. Patient does have some sensitivity with chewing on the right side, however, he primarily chews on the left side. Patient denies having any significant trismus symptoms. Patient denies any significant sensitivity to palpation in the area of the right mandible.  DENTAL EXAM General:  Patient is a well-developed, well-nourished male in no acute distress.  Vitals: As above. Extraoral Exam: There is no palpable submandibular lymphadenopathy. The patient denies acute TMJ symptoms. The patient denies tenderness to palpation of the right mandible and coronoid process area. Intraoral exam: Patient has xerostomia. There are no abscesses is noted. There is no exposed mandible noted. There is a small bony spicule measuring 0.5 mm in the area of number 30. This spicule was removed with no complications today.  Caries: No obvious dental caries are noted. Endodontic: The patient denies acute pulpitis symptoms. C&B: Crown and bridge restorations as before. Prosthodontic: No partial dentures. Occlusion: Stable, but less than ideal occlusion. Radiographic Interpretation: Orthopantogram was taken today. This is compared to the previous dental x-ray taken on 05/07/2011. Tooth numbers 1, 2, 15, 18, 30, 31, and impacted tooth #32 have been removed since the initial radiograph. The extraction  sites appear to be healing in well.   Assessments: 1. History of recent PET scan on 11/01/11 that revealed a radiolucent lesion in the mandible. 2. No exposed bone noted during the intraoral exam today. 3. History of tenderness with chewing on right side by patient report. 4. Multiple missing teeth. 5. Poor occlusal scheme but stable occlusion. 6. Bony spicule area #30 that was removed today.  Plan/Recommendations: 1. I will discuss findings with Dr. Cherly Anderson (Oral Surgeon) and refer to Dr. Retta Mac for evaluation as indicated. Will forward copy of radiographs as indicated. 2. Discussion with Dr. Dorothy Puffer (Radiation Oncology) re: PET scan findings and have discussion with him re: possible osteoradionecrosis versus residual activity associated from surgical extraction followed by radiation therapy.   Dr. Cindra Eves

## 2011-11-08 ENCOUNTER — Ambulatory Visit (HOSPITAL_COMMUNITY)
Admission: RE | Admit: 2011-11-08 | Discharge: 2011-11-08 | Disposition: A | Discharge status: Home / Self Care | Payer: Medicare Other | Source: Ambulatory Visit | Attending: Oncology | Admitting: Oncology

## 2011-11-08 ENCOUNTER — Telehealth (HOSPITAL_COMMUNITY): Payer: Self-pay | Admitting: Dentistry

## 2011-11-08 DIAGNOSIS — C109 Malignant neoplasm of oropharynx, unspecified: Secondary | ICD-10-CM

## 2011-11-08 DIAGNOSIS — Z431 Encounter for attention to gastrostomy: Secondary | ICD-10-CM | POA: Insufficient documentation

## 2011-11-08 DIAGNOSIS — R5383 Other fatigue: Secondary | ICD-10-CM

## 2011-11-08 MED ORDER — DIAZEPAM 5 MG PO TABS
10.0000 mg | ORAL_TABLET | Freq: Once | ORAL | Status: AC
  Administered 2011-11-08: 10 mg via ORAL

## 2011-11-08 MED ORDER — DIAZEPAM 5 MG PO TABS
ORAL_TABLET | ORAL | Status: AC
  Administered 2011-11-08: 10 mg via ORAL
  Filled 2011-11-08: qty 2

## 2011-11-08 MED ORDER — LIDOCAINE VISCOUS 2 % MT SOLN
20.0000 mL | Freq: Once | OROMUCOSAL | Status: DC
  Filled 2011-11-08: qty 20

## 2011-11-08 NOTE — Procedures (Signed)
Successful bedside removal of gastrostomy tube.  No immediate post procedural complications.  

## 2011-11-08 NOTE — Progress Notes (Signed)
20 Fr g tube removed by Dr Grace Isaac.

## 2012-01-11 DIAGNOSIS — D3130 Benign neoplasm of unspecified choroid: Secondary | ICD-10-CM | POA: Diagnosis not present

## 2012-01-11 DIAGNOSIS — H4320 Crystalline deposits in vitreous body, unspecified eye: Secondary | ICD-10-CM | POA: Diagnosis not present

## 2012-01-11 DIAGNOSIS — H524 Presbyopia: Secondary | ICD-10-CM | POA: Diagnosis not present

## 2012-01-21 ENCOUNTER — Encounter: Payer: Self-pay | Admitting: *Deleted

## 2012-01-25 ENCOUNTER — Ambulatory Visit
Admission: RE | Admit: 2012-01-25 | Discharge: 2012-01-25 | Disposition: A | Discharge status: Home / Self Care | Payer: Medicare Other | Source: Ambulatory Visit | Attending: Radiation Oncology | Admitting: Radiation Oncology

## 2012-01-25 ENCOUNTER — Encounter: Payer: Self-pay | Admitting: Radiation Oncology

## 2012-01-25 VITALS — BP 116/74 | HR 69 | Resp 18 | Wt 163.1 lb

## 2012-01-25 DIAGNOSIS — Z7982 Long term (current) use of aspirin: Secondary | ICD-10-CM | POA: Insufficient documentation

## 2012-01-25 DIAGNOSIS — K117 Disturbances of salivary secretion: Secondary | ICD-10-CM | POA: Insufficient documentation

## 2012-01-25 DIAGNOSIS — C01 Malignant neoplasm of base of tongue: Secondary | ICD-10-CM | POA: Diagnosis not present

## 2012-01-25 DIAGNOSIS — Z9221 Personal history of antineoplastic chemotherapy: Secondary | ICD-10-CM | POA: Insufficient documentation

## 2012-01-25 DIAGNOSIS — Z79899 Other long term (current) drug therapy: Secondary | ICD-10-CM | POA: Insufficient documentation

## 2012-01-25 MED ORDER — LARYNGOSCOPY SOLUTION RAD-ONC
15.0000 mL | Freq: Once | TOPICAL | Status: AC
  Administered 2012-01-25: 15 mL via TOPICAL
  Filled 2012-01-25: qty 15

## 2012-01-25 NOTE — Progress Notes (Signed)
Addendum, :patient was scoped larygesopy right nostril by Dr.Moody, 2 pictures taken, patient tolerated well, 6 month f/u appt

## 2012-01-25 NOTE — Progress Notes (Signed)
Patient presents to the clinic today unaccompanied for a follow up appointment with Dr. Mitzi Hansen. Patient is alert and oriented to person, place, and time. No distress noted. Steady gait noted. Pleasant affect noted. Patient reports a dull constant ache in his right lower jaw bone 3 on a scale of 0-10. Patient has lost 9 pounds since February 2012 despite supplementing with 2-3 cans of ensure per day. Patient's feeding tube removed in February. Patient reports decreased appetite. Patient reports continued dry mouth. Patient reports using Biotene but, verbalizes it doesn't help. Patient's voice is very raspy. Patient reports he is unable to sing in church. Reported all findings to Dr. Mitzi Hansen.

## 2012-01-30 ENCOUNTER — Encounter: Payer: Self-pay | Admitting: *Deleted

## 2012-01-30 ENCOUNTER — Telehealth: Payer: Self-pay | Admitting: *Deleted

## 2012-01-30 NOTE — Telephone Encounter (Signed)
Patient called, stating his rx wasn't in at [facility] from [facility], their pharmacy number is [phone],will ask MD and call in rx ,Rx for salagen 5mg  1 po tid dispense#90 with 3 refills called to pharmacist Weyman Croon, thanked her and then called patient back [phone],left vm rx will be ready in 30 minutes 4:17 PM

## 2012-01-31 NOTE — Progress Notes (Signed)
Radiation Oncology         [phone] ________________________________  Name: Jason Noble MRN: [id number]  Date: 01/31/2012  DOB: 1941/05/23  Follow-Up Visit Note  CC: [name], MD, MD  Serena Colonel, MD, Jethro Bolus, M.D.  Diagnosis:   Squamous cell carcinoma of the base of tongue  Interval Since Last Radiation:  6 months   Narrative:  The patient returns today for routine follow-up.  The patient overall has been fairly stable clinically. He had his feeding tube removed in February of this year. He has been drinking 2-3 cans of Ensure per day. The patient still has been losing some weight. This is associated with a decrease in appetite and xerostomia. He is using by 18 but this is not fully helping. The patient is interested in other means of trying to help with the xerostomia. The patient does complain of some right-sided jaw pain which is a dull ache. This has been present for sometime in no clear cause has been found for this although the patient did have dental extractions especially in this area. No exposed bone on workup. The patient did have a PET scan completed on 11/01/2011 since he was last seen. There is no residual hypermetabolic activity within the neck. There is a hypermetabolic lucent lesion in the right mandible which was felt to be consistent with the dental extraction mentioned above.                              ALLERGIES:   has no known allergies.  Meds: Current Outpatient Prescriptions  Medication Sig Dispense Refill  . antiseptic oral rinse (BIOTENE) LIQD 15 mLs by Mouth Rinse route 2 times daily at 12 noon and 4 pm.  500 mL  3  . aspirin EC 325 MG EC tablet Take 325 mg by mouth daily.        Marland Kitchen atorvastatin (LIPITOR) 40 MG tablet Take 40 mg by mouth daily.        . hydrogen peroxide 3 % external solution Apply topically QID. Mix with clean water; 1 part hydrogen peroxide with 4 parts water; gargle and spit every 6 hours for thick phlegm.  500 mL  3  .  Jevity 1.5 Cal (JEVITY 1.5) LIQD Alternate with Osmolite 1.5 EVERY 12 HOURS AND CHANGE TUBING EVERY 24 HOURS.  Rate at 60mL continuously.   Add Free water 250 mL via PEG tube 5 times daily.  948 mL  30  . lactulose (CHRONULAC) 10 GM/15ML solution TAKE 45 MLS (3 TABLESPOONSFUL) BY MOUTH EVERY 3 DAYS AS NEEDED.  240 mL  0  . LORazepam (ATIVAN) 0.5 MG tablet Take 0.5 mg by mouth every 6 (six) hours as needed.       . Nutritional Supplements (FEEDING SUPPLEMENT, OSMOLITE 1.5 CAL,) LIQD Place 1,000 mLs into feeding tube continuous. Alternate with Jevity 1.5 EVERY 12 HOURS AND CHANGE TUBING EVERY 24 HOURS.  Rate at 60mL continuously.   Add Free water 250 mL via PEG tube 5 times daily.  1000 mL  30  . ondansetron (ZOFRAN) 8 MG tablet Take 8 mg by mouth every 12 (twelve) hours as needed.       . pilocarpine (SALAGEN) 5 MG tablet Take 5 mg by mouth 3 (three) times daily. Take 1 po tid, dispense#90 with 3 refills, 01/30/12 to [facility] [phone]      . PRESCRIPTION MEDICATION Take 5 mLs by mouth every 4 (four)  hours as needed. Q-Dryl/Nystat/Almaco/Distille--Swish and swallow 1 teaspoonful (5 m'ls) every 4 hours as needed for mouth soreness       . prochlorperazine (COMPAZINE) 10 MG tablet Take 10 mg by mouth every 6 (six) hours as needed.       . promethazine (PHENERGAN) 25 MG suppository INSERT 1 SUPPOSITORY RECTALLY EVERY 6 HOURS AS NEEDED FOR NAUSEA  12 suppository  0  . SENNA S 8.6-50 MG per tablet PLACE 2 TABLETS INTO FEEDING TUBE 2 TIMES DAILY AS NEEDED FOR CONSTIPATION.  100 tablet  0  . Sodium Fluoride (PREVIDENT 5000 DRY MOUTH) 1.1 % PSTE Apply thin ribbon of cream to toothbrush. Brush teeth for 2 minutes at bedtime. Spit out excess. Do not swallow.   1 Bottle  one year   No current facility-administered medications for this encounter.   Facility-Administered Medications Ordered in Other Encounters  Medication Dose Route Frequency Provider Last Rate Last Dose  . topical emolient (BIAFINE)  emulsion   Topical BID Jonna Coup, MD        Physical Findings: The patient is in no acute distress. Patient is alert and oriented.  weight is 163 lb 1.6 oz (73.982 kg). His blood pressure is 116/74 and his pulse is 69. His respiration is 18. .   General: Well-developed, in no acute distress HEENT: Normocephalic, atraumatic; oral cavity clear; decreased saliva present although there is some. No suspicious findings along the right mandible. Neck: Supple without any lymphadenopathy Cardiovascular: Regular rate and rhythm Respiratory: Clear to auscultation bilaterally GI: Soft, nontender, normal bowel sounds Extremities: No edema present Neuro: No focal deficits Fiber-optic exam: After the use of topical anesthetic a scope was passed through the right near. Good visualization was obtained and no lesions were seen within the oropharynx hypopharynx or larynx. The base of tongue lesion was clear. Radiation effect is seen with some fibrosis of the epiglottis.   Lab Findings: Lab Results  Component Value Date   WBC 5.7 11/01/2011   HGB 14.2 11/01/2011   HCT 41.6 11/01/2011   MCV 95.0 11/01/2011   PLT 198 11/01/2011     Radiographic Findings: No results found.  Impression/ Plan:    71 year old male status post chemotherapy radiotherapy for his squamous cell carcinoma of the base of tongue. The patient has done fairly well I believe with treatment and has had a nice response clinically on exam and on imaging and he remains clinically NED. The patient is continuing to have some symptoms including a dull ache along the right jaw which I believe will continue to improve. My suspicion for osteoradionecrosis at this time is low and I think that this can be followed. The patient is bothered by xerostomia and we discussed the possibility of trying Salagen. He is interested in this and I will call this in to his drugstore. Otherwise the patient will followup in our clinic in 6 months for ongoing  followup.   I spent 15 minutes with the patient today, the majority of which was spent counseling the patient on the diagnosis of cancer and coordinating care.    Radene Gunning, M.D., Ph.D.

## 2012-02-27 DIAGNOSIS — E785 Hyperlipidemia, unspecified: Secondary | ICD-10-CM | POA: Diagnosis not present

## 2012-02-27 DIAGNOSIS — C61 Malignant neoplasm of prostate: Secondary | ICD-10-CM | POA: Diagnosis not present

## 2012-02-27 DIAGNOSIS — E291 Testicular hypofunction: Secondary | ICD-10-CM | POA: Diagnosis not present

## 2012-02-28 DIAGNOSIS — Z8589 Personal history of malignant neoplasm of other organs and systems: Secondary | ICD-10-CM | POA: Diagnosis not present

## 2012-02-28 DIAGNOSIS — K921 Melena: Secondary | ICD-10-CM | POA: Diagnosis not present

## 2012-02-28 DIAGNOSIS — Z800 Family history of malignant neoplasm of digestive organs: Secondary | ICD-10-CM | POA: Diagnosis not present

## 2012-02-29 DIAGNOSIS — K921 Melena: Secondary | ICD-10-CM | POA: Diagnosis not present

## 2012-03-03 ENCOUNTER — Other Ambulatory Visit: Payer: Self-pay | Admitting: Gastroenterology

## 2012-03-03 DIAGNOSIS — Z800 Family history of malignant neoplasm of digestive organs: Secondary | ICD-10-CM | POA: Diagnosis not present

## 2012-03-03 DIAGNOSIS — D128 Benign neoplasm of rectum: Secondary | ICD-10-CM | POA: Diagnosis not present

## 2012-03-03 DIAGNOSIS — D129 Benign neoplasm of anus and anal canal: Secondary | ICD-10-CM | POA: Diagnosis not present

## 2012-03-03 DIAGNOSIS — D126 Benign neoplasm of colon, unspecified: Secondary | ICD-10-CM | POA: Diagnosis not present

## 2012-03-03 DIAGNOSIS — K921 Melena: Secondary | ICD-10-CM | POA: Diagnosis not present

## 2012-03-03 DIAGNOSIS — K648 Other hemorrhoids: Secondary | ICD-10-CM | POA: Diagnosis not present

## 2012-03-05 ENCOUNTER — Other Ambulatory Visit (HOSPITAL_COMMUNITY): Payer: Self-pay | Admitting: Dentistry

## 2012-03-05 ENCOUNTER — Encounter (HOSPITAL_COMMUNITY): Payer: Self-pay

## 2012-03-05 ENCOUNTER — Ambulatory Visit (HOSPITAL_COMMUNITY)
Admission: RE | Admit: 2012-03-05 | Discharge: 2012-03-05 | Disposition: A | Discharge status: Home / Self Care | Payer: Medicare Other | Source: Ambulatory Visit | Attending: Dentistry | Admitting: Dentistry

## 2012-03-05 DIAGNOSIS — Z8589 Personal history of malignant neoplasm of other organs and systems: Secondary | ICD-10-CM | POA: Insufficient documentation

## 2012-03-05 DIAGNOSIS — C760 Malignant neoplasm of head, face and neck: Secondary | ICD-10-CM | POA: Diagnosis not present

## 2012-03-05 DIAGNOSIS — R220 Localized swelling, mass and lump, head: Secondary | ICD-10-CM | POA: Insufficient documentation

## 2012-03-05 DIAGNOSIS — R51 Headache: Secondary | ICD-10-CM | POA: Diagnosis not present

## 2012-03-05 DIAGNOSIS — G9389 Other specified disorders of brain: Secondary | ICD-10-CM | POA: Insufficient documentation

## 2012-03-05 DIAGNOSIS — R221 Localized swelling, mass and lump, neck: Secondary | ICD-10-CM

## 2012-03-05 MED ORDER — IOHEXOL 300 MG/ML  SOLN
75.0000 mL | Freq: Once | INTRAMUSCULAR | Status: AC | PRN
  Administered 2012-03-05: 75 mL via INTRAVENOUS

## 2012-03-11 ENCOUNTER — Encounter (HOSPITAL_BASED_OUTPATIENT_CLINIC_OR_DEPARTMENT_OTHER): Payer: Medicare Other | Attending: General Surgery

## 2012-03-11 ENCOUNTER — Other Ambulatory Visit (HOSPITAL_BASED_OUTPATIENT_CLINIC_OR_DEPARTMENT_OTHER): Payer: Self-pay | Admitting: General Surgery

## 2012-03-11 ENCOUNTER — Ambulatory Visit (HOSPITAL_COMMUNITY)
Admission: RE | Admit: 2012-03-11 | Discharge: 2012-03-11 | Disposition: A | Discharge status: Home / Self Care | Payer: Medicare Other | Source: Ambulatory Visit | Attending: General Surgery | Admitting: General Surgery

## 2012-03-11 DIAGNOSIS — Z01818 Encounter for other preprocedural examination: Secondary | ICD-10-CM | POA: Insufficient documentation

## 2012-03-11 DIAGNOSIS — M278 Other specified diseases of jaws: Secondary | ICD-10-CM | POA: Diagnosis not present

## 2012-03-11 DIAGNOSIS — Y849 Medical procedure, unspecified as the cause of abnormal reaction of the patient, or of later complication, without mention of misadventure at the time of the procedure: Secondary | ICD-10-CM | POA: Insufficient documentation

## 2012-03-11 DIAGNOSIS — Z951 Presence of aortocoronary bypass graft: Secondary | ICD-10-CM | POA: Insufficient documentation

## 2012-03-11 DIAGNOSIS — C01 Malignant neoplasm of base of tongue: Secondary | ICD-10-CM | POA: Insufficient documentation

## 2012-03-11 DIAGNOSIS — T8189XA Other complications of procedures, not elsewhere classified, initial encounter: Secondary | ICD-10-CM | POA: Insufficient documentation

## 2012-03-11 DIAGNOSIS — Y842 Radiological procedure and radiotherapy as the cause of abnormal reaction of the patient, or of later complication, without mention of misadventure at the time of the procedure: Secondary | ICD-10-CM | POA: Insufficient documentation

## 2012-03-11 DIAGNOSIS — R918 Other nonspecific abnormal finding of lung field: Secondary | ICD-10-CM | POA: Diagnosis not present

## 2012-03-11 DIAGNOSIS — Z87891 Personal history of nicotine dependence: Secondary | ICD-10-CM | POA: Diagnosis not present

## 2012-03-11 DIAGNOSIS — S0180XA Unspecified open wound of other part of head, initial encounter: Secondary | ICD-10-CM | POA: Diagnosis not present

## 2012-03-12 NOTE — Progress Notes (Signed)
[facility]  NAME:  Jason Noble, Jason Noble NO.:  [id number]  MEDICAL RECORD NO.:  [id number]      DATE OF BIRTH:  07-11-1941  PHYSICIAN:  Ardath Sax, M.D.           VISIT DATE:                                  OFFICE VISIT   Jason Noble is a 71 year old gentleman who unfortunately had a diagnosis of carcinoma of the base of his tongue and underwent 33 radiation treatments and also about 3 months of chemotherapy.  He was sent here by Dr. Retta Mac, an oral surgeon who followed him through this radiation.  Following the radiation and chemotherapy, he developed some radiation osteonecrosis.  He lost several teeth and his right lower mandible.  He has considerable soft tissue fibrosis.  He is unable to open his mouth sufficiently to eat properly.  He is presently just on liquids.  He is unable to chew any solid food.  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  He has a great deal of pain, and Dr. Retta Mac has sent him to the [facility] to see if we could evaluate him and perhaps would be able to help his situation with hyperbaric oxygen treatments.  I certainly concur with Dr. Retta Mac that hyperbaric oxygen would help these tissues heal from the radiation damage and perhaps would loosen up the fibrosis and help him so he could open his mouth wide enough so that he can eat and perhaps alleviate the pain.  He is in pain and requires narcotics to control the pain so that he can sleep and swallow at least liquids.  Other than that, he is reasonably healthy.  His vital signs are normal.  He has undergone a triple bypass and he has had gallbladder surgery, and he has had a prostatectomy.  We are writing this letter to his insurance company for them to give [place] permission to give this gentleman hyperbaric oxygen so as to alleviate his pain and facilitate him to be  able to properly eat. Because of his inability to eat, he has lost 25 pounds, so we will be following [name] here in the clinic and letter from his insurance company for hyperbaric oxygen treatments.     Ardath Sax, M.D.     PP/MEDQ  D:  03/12/2012  T:  03/12/2012  Job:  [id number]

## 2012-03-13 ENCOUNTER — Telehealth: Payer: Self-pay | Admitting: Cardiology

## 2012-03-13 NOTE — Telephone Encounter (Signed)
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  Rusty would like a note from Dr Riley Kill that the pt is okay to undergo these treatments from a cardiac standpoint (fax [phone]  Attn: Toney Sang).  This pt has not been evaluated by Dr Riley Kill since 09/05/10. I made Rusty aware of this information and that Dr Riley Kill would have to see the pt before providing any form of documentation. Rusty said he mainly wants Dr Riley Kill to know that the pt is undergoing these treatments.  Toney Sang will speak with the Hyperbaric coordinator and if a note is needed then he will contact our office to schedule the pt for an appointment.

## 2012-03-13 NOTE — Telephone Encounter (Signed)
New problem:  Per Rusty calling from  [facility] [address]  patient having upcoming procedure - hyper batric - . Need to discuss care.

## 2012-03-14 DIAGNOSIS — M278 Other specified diseases of jaws: Secondary | ICD-10-CM | POA: Diagnosis not present

## 2012-03-14 DIAGNOSIS — C01 Malignant neoplasm of base of tongue: Secondary | ICD-10-CM | POA: Diagnosis not present

## 2012-03-17 ENCOUNTER — Encounter (HOSPITAL_BASED_OUTPATIENT_CLINIC_OR_DEPARTMENT_OTHER): Payer: Medicare Other | Attending: General Surgery

## 2012-03-17 DIAGNOSIS — C01 Malignant neoplasm of base of tongue: Secondary | ICD-10-CM | POA: Diagnosis not present

## 2012-03-17 DIAGNOSIS — T66XXXA Radiation sickness, unspecified, initial encounter: Secondary | ICD-10-CM | POA: Insufficient documentation

## 2012-03-17 DIAGNOSIS — M278 Other specified diseases of jaws: Secondary | ICD-10-CM | POA: Diagnosis not present

## 2012-03-17 DIAGNOSIS — Y842 Radiological procedure and radiotherapy as the cause of abnormal reaction of the patient, or of later complication, without mention of misadventure at the time of the procedure: Secondary | ICD-10-CM | POA: Insufficient documentation

## 2012-03-18 DIAGNOSIS — T66XXXA Radiation sickness, unspecified, initial encounter: Secondary | ICD-10-CM | POA: Diagnosis not present

## 2012-03-18 DIAGNOSIS — C01 Malignant neoplasm of base of tongue: Secondary | ICD-10-CM | POA: Diagnosis not present

## 2012-03-18 DIAGNOSIS — M278 Other specified diseases of jaws: Secondary | ICD-10-CM | POA: Diagnosis not present

## 2012-03-19 DIAGNOSIS — C01 Malignant neoplasm of base of tongue: Secondary | ICD-10-CM | POA: Diagnosis not present

## 2012-03-19 DIAGNOSIS — T66XXXA Radiation sickness, unspecified, initial encounter: Secondary | ICD-10-CM | POA: Diagnosis not present

## 2012-03-19 DIAGNOSIS — M278 Other specified diseases of jaws: Secondary | ICD-10-CM | POA: Diagnosis not present

## 2012-03-21 DIAGNOSIS — C01 Malignant neoplasm of base of tongue: Secondary | ICD-10-CM | POA: Diagnosis not present

## 2012-03-21 DIAGNOSIS — T66XXXA Radiation sickness, unspecified, initial encounter: Secondary | ICD-10-CM | POA: Diagnosis not present

## 2012-03-21 DIAGNOSIS — M278 Other specified diseases of jaws: Secondary | ICD-10-CM | POA: Diagnosis not present

## 2012-03-24 DIAGNOSIS — T66XXXA Radiation sickness, unspecified, initial encounter: Secondary | ICD-10-CM | POA: Diagnosis not present

## 2012-03-24 DIAGNOSIS — M278 Other specified diseases of jaws: Secondary | ICD-10-CM | POA: Diagnosis not present

## 2012-03-24 DIAGNOSIS — C01 Malignant neoplasm of base of tongue: Secondary | ICD-10-CM | POA: Diagnosis not present

## 2012-03-25 DIAGNOSIS — T66XXXA Radiation sickness, unspecified, initial encounter: Secondary | ICD-10-CM | POA: Diagnosis not present

## 2012-03-25 DIAGNOSIS — C01 Malignant neoplasm of base of tongue: Secondary | ICD-10-CM | POA: Diagnosis not present

## 2012-03-25 DIAGNOSIS — M278 Other specified diseases of jaws: Secondary | ICD-10-CM | POA: Diagnosis not present

## 2012-03-26 DIAGNOSIS — C01 Malignant neoplasm of base of tongue: Secondary | ICD-10-CM | POA: Diagnosis not present

## 2012-03-26 DIAGNOSIS — M278 Other specified diseases of jaws: Secondary | ICD-10-CM | POA: Diagnosis not present

## 2012-03-26 DIAGNOSIS — T66XXXA Radiation sickness, unspecified, initial encounter: Secondary | ICD-10-CM | POA: Diagnosis not present

## 2012-03-27 DIAGNOSIS — T66XXXA Radiation sickness, unspecified, initial encounter: Secondary | ICD-10-CM | POA: Diagnosis not present

## 2012-03-27 DIAGNOSIS — M278 Other specified diseases of jaws: Secondary | ICD-10-CM | POA: Diagnosis not present

## 2012-03-27 DIAGNOSIS — C01 Malignant neoplasm of base of tongue: Secondary | ICD-10-CM | POA: Diagnosis not present

## 2012-03-28 DIAGNOSIS — T66XXXA Radiation sickness, unspecified, initial encounter: Secondary | ICD-10-CM | POA: Diagnosis not present

## 2012-03-28 DIAGNOSIS — C01 Malignant neoplasm of base of tongue: Secondary | ICD-10-CM | POA: Diagnosis not present

## 2012-03-28 DIAGNOSIS — M278 Other specified diseases of jaws: Secondary | ICD-10-CM | POA: Diagnosis not present

## 2012-03-31 DIAGNOSIS — M278 Other specified diseases of jaws: Secondary | ICD-10-CM | POA: Diagnosis not present

## 2012-03-31 DIAGNOSIS — T66XXXA Radiation sickness, unspecified, initial encounter: Secondary | ICD-10-CM | POA: Diagnosis not present

## 2012-03-31 DIAGNOSIS — C01 Malignant neoplasm of base of tongue: Secondary | ICD-10-CM | POA: Diagnosis not present

## 2012-04-01 DIAGNOSIS — T66XXXA Radiation sickness, unspecified, initial encounter: Secondary | ICD-10-CM | POA: Diagnosis not present

## 2012-04-01 DIAGNOSIS — C01 Malignant neoplasm of base of tongue: Secondary | ICD-10-CM | POA: Diagnosis not present

## 2012-04-01 DIAGNOSIS — M278 Other specified diseases of jaws: Secondary | ICD-10-CM | POA: Diagnosis not present

## 2012-04-02 DIAGNOSIS — T66XXXA Radiation sickness, unspecified, initial encounter: Secondary | ICD-10-CM | POA: Diagnosis not present

## 2012-04-02 DIAGNOSIS — M278 Other specified diseases of jaws: Secondary | ICD-10-CM | POA: Diagnosis not present

## 2012-04-02 DIAGNOSIS — C01 Malignant neoplasm of base of tongue: Secondary | ICD-10-CM | POA: Diagnosis not present

## 2012-04-03 DIAGNOSIS — C01 Malignant neoplasm of base of tongue: Secondary | ICD-10-CM | POA: Diagnosis not present

## 2012-04-03 DIAGNOSIS — T66XXXA Radiation sickness, unspecified, initial encounter: Secondary | ICD-10-CM | POA: Diagnosis not present

## 2012-04-03 DIAGNOSIS — M278 Other specified diseases of jaws: Secondary | ICD-10-CM | POA: Diagnosis not present

## 2012-04-04 DIAGNOSIS — C01 Malignant neoplasm of base of tongue: Secondary | ICD-10-CM | POA: Diagnosis not present

## 2012-04-04 DIAGNOSIS — T66XXXA Radiation sickness, unspecified, initial encounter: Secondary | ICD-10-CM | POA: Diagnosis not present

## 2012-04-04 DIAGNOSIS — M278 Other specified diseases of jaws: Secondary | ICD-10-CM | POA: Diagnosis not present

## 2012-04-07 DIAGNOSIS — M278 Other specified diseases of jaws: Secondary | ICD-10-CM | POA: Diagnosis not present

## 2012-04-07 DIAGNOSIS — C01 Malignant neoplasm of base of tongue: Secondary | ICD-10-CM | POA: Diagnosis not present

## 2012-04-07 DIAGNOSIS — T66XXXA Radiation sickness, unspecified, initial encounter: Secondary | ICD-10-CM | POA: Diagnosis not present

## 2012-04-08 ENCOUNTER — Encounter (HOSPITAL_BASED_OUTPATIENT_CLINIC_OR_DEPARTMENT_OTHER): Payer: Medicare Other

## 2012-04-08 DIAGNOSIS — C01 Malignant neoplasm of base of tongue: Secondary | ICD-10-CM | POA: Diagnosis not present

## 2012-04-08 DIAGNOSIS — T66XXXA Radiation sickness, unspecified, initial encounter: Secondary | ICD-10-CM | POA: Diagnosis not present

## 2012-04-08 DIAGNOSIS — M278 Other specified diseases of jaws: Secondary | ICD-10-CM | POA: Diagnosis not present

## 2012-04-09 DIAGNOSIS — C01 Malignant neoplasm of base of tongue: Secondary | ICD-10-CM | POA: Diagnosis not present

## 2012-04-09 DIAGNOSIS — T66XXXA Radiation sickness, unspecified, initial encounter: Secondary | ICD-10-CM | POA: Diagnosis not present

## 2012-04-09 DIAGNOSIS — M278 Other specified diseases of jaws: Secondary | ICD-10-CM | POA: Diagnosis not present

## 2012-04-10 DIAGNOSIS — M278 Other specified diseases of jaws: Secondary | ICD-10-CM | POA: Diagnosis not present

## 2012-04-10 DIAGNOSIS — T66XXXA Radiation sickness, unspecified, initial encounter: Secondary | ICD-10-CM | POA: Diagnosis not present

## 2012-04-10 DIAGNOSIS — C01 Malignant neoplasm of base of tongue: Secondary | ICD-10-CM | POA: Diagnosis not present

## 2012-04-11 DIAGNOSIS — C01 Malignant neoplasm of base of tongue: Secondary | ICD-10-CM | POA: Diagnosis not present

## 2012-04-11 DIAGNOSIS — T66XXXA Radiation sickness, unspecified, initial encounter: Secondary | ICD-10-CM | POA: Diagnosis not present

## 2012-04-11 DIAGNOSIS — M278 Other specified diseases of jaws: Secondary | ICD-10-CM | POA: Diagnosis not present

## 2012-04-14 DIAGNOSIS — T66XXXA Radiation sickness, unspecified, initial encounter: Secondary | ICD-10-CM | POA: Diagnosis not present

## 2012-04-14 DIAGNOSIS — M278 Other specified diseases of jaws: Secondary | ICD-10-CM | POA: Diagnosis not present

## 2012-04-14 DIAGNOSIS — C01 Malignant neoplasm of base of tongue: Secondary | ICD-10-CM | POA: Diagnosis not present

## 2012-04-15 DIAGNOSIS — M278 Other specified diseases of jaws: Secondary | ICD-10-CM | POA: Diagnosis not present

## 2012-04-15 DIAGNOSIS — C01 Malignant neoplasm of base of tongue: Secondary | ICD-10-CM | POA: Diagnosis not present

## 2012-04-15 DIAGNOSIS — T66XXXA Radiation sickness, unspecified, initial encounter: Secondary | ICD-10-CM | POA: Diagnosis not present

## 2012-04-16 DIAGNOSIS — T66XXXA Radiation sickness, unspecified, initial encounter: Secondary | ICD-10-CM | POA: Diagnosis not present

## 2012-04-16 DIAGNOSIS — C01 Malignant neoplasm of base of tongue: Secondary | ICD-10-CM | POA: Diagnosis not present

## 2012-04-16 DIAGNOSIS — M278 Other specified diseases of jaws: Secondary | ICD-10-CM | POA: Diagnosis not present

## 2012-04-17 ENCOUNTER — Encounter: Payer: Self-pay | Admitting: Oncology

## 2012-04-17 ENCOUNTER — Other Ambulatory Visit (HOSPITAL_BASED_OUTPATIENT_CLINIC_OR_DEPARTMENT_OTHER): Payer: Medicare Other | Admitting: Lab

## 2012-04-17 ENCOUNTER — Ambulatory Visit (HOSPITAL_BASED_OUTPATIENT_CLINIC_OR_DEPARTMENT_OTHER): Payer: Medicare Other | Admitting: Oncology

## 2012-04-17 ENCOUNTER — Encounter (HOSPITAL_BASED_OUTPATIENT_CLINIC_OR_DEPARTMENT_OTHER): Payer: Medicare Other | Attending: General Surgery

## 2012-04-17 ENCOUNTER — Telehealth: Payer: Self-pay | Admitting: Oncology

## 2012-04-17 VITALS — BP 153/88 | HR 57 | Temp 97.3°F | Ht 69.0 in | Wt 156.7 lb

## 2012-04-17 DIAGNOSIS — M8708 Idiopathic aseptic necrosis of bone, other site: Secondary | ICD-10-CM | POA: Diagnosis not present

## 2012-04-17 DIAGNOSIS — C01 Malignant neoplasm of base of tongue: Secondary | ICD-10-CM | POA: Diagnosis not present

## 2012-04-17 DIAGNOSIS — R5383 Other fatigue: Secondary | ICD-10-CM | POA: Diagnosis not present

## 2012-04-17 DIAGNOSIS — K117 Disturbances of salivary secretion: Secondary | ICD-10-CM

## 2012-04-17 DIAGNOSIS — Y842 Radiological procedure and radiotherapy as the cause of abnormal reaction of the patient, or of later complication, without mention of misadventure at the time of the procedure: Secondary | ICD-10-CM | POA: Insufficient documentation

## 2012-04-17 DIAGNOSIS — C109 Malignant neoplasm of oropharynx, unspecified: Secondary | ICD-10-CM

## 2012-04-17 DIAGNOSIS — T66XXXS Radiation sickness, unspecified, sequela: Secondary | ICD-10-CM | POA: Insufficient documentation

## 2012-04-17 DIAGNOSIS — R5381 Other malaise: Secondary | ICD-10-CM | POA: Diagnosis not present

## 2012-04-17 DIAGNOSIS — E441 Mild protein-calorie malnutrition: Secondary | ICD-10-CM

## 2012-04-17 DIAGNOSIS — Z87891 Personal history of nicotine dependence: Secondary | ICD-10-CM

## 2012-04-17 LAB — CBC WITH DIFFERENTIAL/PLATELET
BASO%: 0.3 % (ref 0.0–2.0)
Basophils Absolute: 0 10*3/uL (ref 0.0–0.1)
EOS%: 4 % (ref 0.0–7.0)
Eosinophils Absolute: 0.1 10*3/uL (ref 0.0–0.5)
HCT: 37.5 % — ABNORMAL LOW (ref 38.4–49.9)
HGB: 12.6 g/dL — ABNORMAL LOW (ref 13.0–17.1)
LYMPH%: 21.7 % (ref 14.0–49.0)
MCH: 30.9 pg (ref 27.2–33.4)
MCHC: 33.5 g/dL (ref 32.0–36.0)
MCV: 92 fL (ref 79.3–98.0)
MONO#: 0.3 10*3/uL (ref 0.1–0.9)
MONO%: 9.2 % (ref 0.0–14.0)
NEUT#: 2.4 10*3/uL (ref 1.5–6.5)
NEUT%: 64.8 % (ref 39.0–75.0)
Platelets: 184 10*3/uL (ref 140–400)
RBC: 4.08 10*6/uL — ABNORMAL LOW (ref 4.20–5.82)
RDW: 14 % (ref 11.0–14.6)
WBC: 3.8 10*3/uL — ABNORMAL LOW (ref 4.0–10.3)
lymph#: 0.8 10*3/uL — ABNORMAL LOW (ref 0.9–3.3)

## 2012-04-17 LAB — COMPREHENSIVE METABOLIC PANEL
ALT: 10 U/L (ref 0–53)
AST: 15 U/L (ref 0–37)
Albumin: 3.7 g/dL (ref 3.5–5.2)
Alkaline Phosphatase: 68 U/L (ref 39–117)
BUN: 22 mg/dL (ref 6–23)
CO2: 28 mEq/L (ref 19–32)
Calcium: 9.5 mg/dL (ref 8.4–10.5)
Chloride: 106 mEq/L (ref 96–112)
Creatinine, Ser: 0.88 mg/dL (ref 0.50–1.35)
Glucose, Bld: 88 mg/dL (ref 70–99)
Potassium: 4.6 mEq/L (ref 3.5–5.3)
Sodium: 139 mEq/L (ref 135–145)
Total Bilirubin: 0.5 mg/dL (ref 0.3–1.2)
Total Protein: 6 g/dL (ref 6.0–8.3)

## 2012-04-17 LAB — TSH: TSH: 1.36 u[IU]/mL (ref 0.350–4.500)

## 2012-04-17 NOTE — Progress Notes (Signed)
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   PAST THERAPY: concurrent chemoradiation therapy with weekly cisplatin and daily radiation therapy between June 18, 2011 and August 03, 2011.   CURRENT THERAPY: watchful observation  INTERVAL HISTORY: Jason Noble 71 y.o. male returns for regular follow up by himself.  He was diagnosed with right low jaw radiation-induced osteonecrosis.  He has been with Dr. Retta Mac with several courses of antibiotics and surgical debridement.  He has a difficult time open his jaw wide.  He only has mild discomfort in the bilateral jaw but no pain that requires frequent pain med.  He is able to eat all kinds of foods without restriction. However has very low appetite and has weight loss the past few months especially with the osteonecrosis.  He still has xerostomia and he requires Salagen to improve his xerostomia. He denies dysphagia, odynophagia. He has thickening of the submental area however no discrete lymph node swelling. Despite the weight loss, he is still very strong. He is independent of all activities of daily living and he is able to drive himself to clinic today.  Patient denies fever, anorexia, weight loss, fatigue, headache, visual changes, confusion, drenching night sweats, palpable lymph node swelling, nausea vomiting, jaundice, chest pain, palpitation, shortness of breath, dyspnea on exertion, productive cough, gum bleeding, epistaxis, hematemesis, hemoptysis, abdominal pain, abdominal swelling, early satiety, melena, hematochezia, hematuria, skin rash, spontaneous bleeding, heat or cold intolerance, bowel bladder incontinence, back pain, focal motor weakness, paresthesia, depression, suicidal or homocidal ideation, feeling  hopelessness.   Past Medical History  Diagnosis Date  . Cholecystitis   . Abscess of gallbladder   . CVA (cerebral infarction) 2007  . 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 Skin cancer     basal cell, squamous cell  . Prostate cancer 2002  . Basal cell carcinoma of skin   . Squamous cell carcinoma of skin   . Myocardial infarction     approx. 2--8  . Coronary heart disease     has a stent  . History of radiation therapy 06/18/2011- 08/03/2011    squamous cell tongue  . Fatigue 07/26/2011    Past Surgical History  Procedure Date  . Prostatectomy   . Peg tube placement 06/13/11 at [facility]  . Coronary artery bypass graft 1991  . Cholecystectomy [id number],7846    3 surgeries involved, laparotomy  . Tonsillectomy   . Wrist surgery approx. 39 yrs ago    removal left radial head  . Multiple tooth extractions     prior to radiation    Current Outpatient Prescriptions  Medication Sig Dispense Refill  . amoxicillin-clavulanate (AUGMENTIN) 875-125 MG per tablet 875-125mg  BID      . antiseptic oral rinse (BIOTENE) LIQD 15 mLs by Mouth Rinse route 2 times daily at 12 noon and 4 pm.  500 mL  3  . HYDROcodone-acetaminophen (NORCO) 7.5-325 MG per tablet PRN      . hydrogen peroxide 3 % external solution Apply topically QID. Mix with clean water; 1 part hydrogen peroxide with 4 parts water; gargle and spit every 6 hours for thick phlegm.  500 mL  3  . pilocarpine (SALAGEN) 5 MG tablet Take 5 mg by mouth 3 (three) times daily. Take 1 po tid, dispense#90 with  3 refills, 01/30/12 to [facility] [phone]      . Sodium Fluoride (PREVIDENT 5000 DRY MOUTH) 1.1 % PSTE Apply thin ribbon of cream to toothbrush. Brush teeth for 2 minutes at bedtime. Spit out excess. Do not swallow.   1 Bottle  one year  . tretinoin (RETIN-A) 0.025 % cream As Directed      . aspirin EC 325 MG EC tablet Take 325 mg by mouth daily.         No current facility-administered medications for this visit.    Facility-Administered Medications Ordered in Other Visits  Medication Dose Route Frequency Provider Last Rate Last Dose  . topical emolient (BIAFINE) emulsion   Topical BID Jonna Coup, MD        ALLERGIES:   has no known allergies.  REVIEW OF SYSTEMS:  The rest of the 14-point review of system was negative.   Filed Vitals:   04/17/12 1036  BP: 153/88  Pulse: 57  Temp: 97.3 F (36.3 C)   Wt Readings from Last 3 Encounters:  04/17/12 156 lb 11.2 oz (71.079 kg)  01/25/12 163 lb 1.6 oz (73.982 kg)  11/05/11 172 lb 3.2 oz (78.109 kg)   ECOG Performance status: 0  PHYSICAL EXAMINATION:   General:  well-nourished in no acute distress.  Eyes:  no scleral icterus.  ENT:  There were no oropharyngeal lesions.  Neck was without thyromegaly.  Lymphatics:  Negative cervical, supraclavicular or axillary adenopathy.  There was submental edema without discrete mass. Respiratory: lungs were clear bilaterally without wheezing or crackles.  Cardiovascular:  Regular rate and rhythm, S1/S2, without murmur, rub or gallop.  There was no pedal edema.  GI:  abdomen was soft, flat, nontender, nondistended, without organomegaly.  Muscoloskeletal:  no spinal tenderness of palpation of vertebral spine.  Skin exam was without echymosis, petichae.  Neuro exam was nonfocal.  Patient was able to get on and off exam table without assistance.  Gait was normal.  Patient was alerted and oriented.  Attention was good.   Language was appropriate.  Mood was normal without depression.  Speech was not pressured.  Thought content was not tangential.    LABORATORY/RADIOLOGY DATA:  Lab Results  Component Value Date   WBC 3.8* 04/17/2012   HGB 12.6* 04/17/2012   HCT 37.5* 04/17/2012   PLT 184 04/17/2012   GLUCOSE 88 04/17/2012   CHOL 182 11/18/2008   TRIG 81 11/18/2008   HDL 58.4 11/18/2008   LDLCALC 107* 11/18/2008   ALKPHOS 68 04/17/2012   ALT 10 04/17/2012   AST 15 04/17/2012   NA 139 04/17/2012   K 4.6 04/17/2012   CL 106 04/17/2012    CREATININE 0.88 04/17/2012   BUN 22 04/17/2012   CO2 28 04/17/2012   INR 1.03 06/13/2011   HGBA1C  Value: 6.0 (NOTE)                                                                       According to the ADA Clinical Practice Recommendations for 2011, when HbA1c is used as a screening test:   >=6.5%   Diagnostic of Diabetes Mellitus           (if abnormal result  is confirmed)  5.7-6.4%  Increased risk of developing Diabetes Mellitus  References:Diagnosis and Classification of Diabetes Mellitus,Diabetes Care,2011,34(Suppl 1):S62-S69 and Standards of Medical Care in         Diabetes - 2011,Diabetes Care,2011,34  (Suppl 1):S11-S61.* 04/27/2010    ASSESSMENT AND PLAN:   1. Oropharynx cancer: No evidence of residual disease.  I personally reviewed and showed him the images of the latest CT neck.   Since there was recent radiation necrosis, I requested another CT neck in about 6 months to ensure resolution.   I stressed the importance of follow up with Rad Onc and ENT where laryngoscopy can be performed for routine surveillance. I will see him in about 6 months.   2. Mild calorie-protein malnutrition: Hopefully with resolution of the necrosis, he will have less pain and eat more.   3.  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   4. History of coronary artery disease. Status post bypass long time ago and one episode of in-graft stenosis with stent placement. Since 2008, he has been stable, no CHF. He is on aspirin.  With his weight loss, he had self d/c his statin. I defer to his cardiologist for management.   5. History of prostate cancer in 2002. He is under care of his urologist and is reportedly in NED.   6. History of smoking for 5 years and quit in 1967. He is not smoking now. I advised him to refrain from smoking, chewing tobacco and heavy EtOH consumption to decrease the risk of recurrent cancer.   7. Follow up: With me in about 6 months.    The length of time of the face-to-face encounter was 15 minutes. More  than 50% of time was spent counseling and coordination of care.

## 2012-04-17 NOTE — Telephone Encounter (Signed)
appts made and printed for pt.  Pt states we were to refer him for his neck-some type of wrap??????sent inbox to dr ha and will call pt aom

## 2012-04-17 NOTE — Patient Instructions (Addendum)
1.  CT result 02/2012:  Findings: Anterior left frontal lobe encephalomalacia extending to  the left frontal horn is chronic. Other visualized brain parenchyma  is within normal limits. Negative visualized orbit soft tissues.  Visualized paranasal sinuses and mastoids are clear.  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 Mild  Calcified atherosclerosis at the skull base. Visualized major  vascular structures are enhancing. The cervical internal carotid  arteries are tortuous.  Severe osteolysis of the posterior body and angle of the right  mandible is re-identified. There is cortical destruction medially  and laterally such that pathologic fracture has occurred. This is  nondisplaced. Hypodensity tracks from the abnormal bone through  the lateral cortical defect on series 3 image 23, but does not  significantly deform the adjacent masseter muscle. There is now  gas within the destroyed segment of bone.  Elsewhere in the mandible is sequelae of tooth extraction, but no  other destructive osseous lesion. Similar to extraction changes  seen at the posterior alveolar process of the maxilla.  There is also increased asymmetric enlargement of the right  submandibular gland which appears indistinct and inflamed. There  is surrounding right submandibular space, and more generalized  bilateral submandibular stranding and thickening of the platysma.  No associated drainable fluid collection or abscess. No  sialolithiasis identified.  The parapharyngeal spaces are spared. The retropharyngeal space is  not affected. No associated lymphadenopathy is identified.  Asymmetry of the supraglottic larynx on the right at the  aryepiglottic fold (series 3 image 9) appears increased. Other  pharyngeal contours are within normal limits and there is diffuse  pharyngeal mucosal thickening compatible with sequelae of XRT.    IMPRESSION:   1. Interval progression of right mandible bone  destruction and  pathologic fracture centered at the angle. Gas now within the  lytic bone. Subtle low density tracking through the lateral  cortical defect minimally affecting the right masseter muscle, but  no drainable fluid collection or abscess.  Favor osteonecrosis (AVN or radio osteonecrosis) over a malignant  or metastatic process in the bone.  2. Acute inflammation of the right submandibular gland and more  generalized submandibular fat stranding and thickening of the  platysma. This might be a sequelae of #1, but alternatively could  be coincidental infectious sialoadenitis or possibly sequelae of  XRT.  3. Soft tissue asymmetry of the right supraglottic larynx at the  aryepiglottic fold appears increased since February. Attention  directed on follow-up.  4. No upper cervical lymphadenopathy.  Furthermore I discussed this case with Dr. Retta Mac by telephone at  the time of this addendum. He advises that the patient recently  stopped antibiotic therapy that was treating a nonhealing/infected  right impacted right mandible wisdom tooth extraction site that  corresponds to the bulk of the mandible defect on this study.  I suspect therefore the constellation of findings now all reflect  spread of that infection to the right submandibular space and right  submandibular gland. Again, no drainable fluid collection at this  time.  B.  Chest Xray 02/2012  Findings: Prior median sternotomy. Midline trachea. Normal heart  size. Tortuous descending thoracic aorta. No pleural effusion or  pneumothorax. Favor osseous summation shadow or nipple shadow  projecting over the left lung base on the frontal.   IMPRESSION:  1. No acute cardiopulmonary disease.  2. Probable osseous summation or nipple shadow projecting over the  left lung base on the frontal. No pulmonary nodule in this area on  11/01/2011.  Recommend attention on follow-up.  C.  Follow up:  CT neck and visit in about 6  months.

## 2012-04-18 DIAGNOSIS — M8708 Idiopathic aseptic necrosis of bone, other site: Secondary | ICD-10-CM | POA: Diagnosis not present

## 2012-04-18 DIAGNOSIS — T66XXXS Radiation sickness, unspecified, sequela: Secondary | ICD-10-CM | POA: Diagnosis not present

## 2012-04-21 DIAGNOSIS — M8708 Idiopathic aseptic necrosis of bone, other site: Secondary | ICD-10-CM | POA: Diagnosis not present

## 2012-04-21 DIAGNOSIS — T66XXXS Radiation sickness, unspecified, sequela: Secondary | ICD-10-CM | POA: Diagnosis not present

## 2012-04-22 DIAGNOSIS — T66XXXS Radiation sickness, unspecified, sequela: Secondary | ICD-10-CM | POA: Diagnosis not present

## 2012-04-22 DIAGNOSIS — M8708 Idiopathic aseptic necrosis of bone, other site: Secondary | ICD-10-CM | POA: Diagnosis not present

## 2012-04-23 DIAGNOSIS — S02650A Fracture of angle of mandible, unspecified side, initial encounter for closed fracture: Secondary | ICD-10-CM | POA: Diagnosis not present

## 2012-04-23 DIAGNOSIS — D0000 Carcinoma in situ of oral cavity, unspecified site: Secondary | ICD-10-CM | POA: Diagnosis not present

## 2012-04-23 DIAGNOSIS — M8430XA Stress fracture, unspecified site, initial encounter for fracture: Secondary | ICD-10-CM | POA: Diagnosis not present

## 2012-04-23 DIAGNOSIS — M8448XA Pathological fracture, other site, initial encounter for fracture: Secondary | ICD-10-CM | POA: Diagnosis not present

## 2012-04-24 DIAGNOSIS — T66XXXS Radiation sickness, unspecified, sequela: Secondary | ICD-10-CM | POA: Diagnosis not present

## 2012-04-24 DIAGNOSIS — M8708 Idiopathic aseptic necrosis of bone, other site: Secondary | ICD-10-CM | POA: Diagnosis not present

## 2012-04-25 DIAGNOSIS — T66XXXS Radiation sickness, unspecified, sequela: Secondary | ICD-10-CM | POA: Diagnosis not present

## 2012-04-25 DIAGNOSIS — M8708 Idiopathic aseptic necrosis of bone, other site: Secondary | ICD-10-CM | POA: Diagnosis not present

## 2012-04-28 DIAGNOSIS — M8708 Idiopathic aseptic necrosis of bone, other site: Secondary | ICD-10-CM | POA: Diagnosis not present

## 2012-04-28 DIAGNOSIS — T66XXXS Radiation sickness, unspecified, sequela: Secondary | ICD-10-CM | POA: Diagnosis not present

## 2012-04-29 DIAGNOSIS — T66XXXS Radiation sickness, unspecified, sequela: Secondary | ICD-10-CM | POA: Diagnosis not present

## 2012-04-29 DIAGNOSIS — M8708 Idiopathic aseptic necrosis of bone, other site: Secondary | ICD-10-CM | POA: Diagnosis not present

## 2012-04-30 DIAGNOSIS — M8708 Idiopathic aseptic necrosis of bone, other site: Secondary | ICD-10-CM | POA: Diagnosis not present

## 2012-04-30 DIAGNOSIS — T66XXXS Radiation sickness, unspecified, sequela: Secondary | ICD-10-CM | POA: Diagnosis not present

## 2012-05-01 DIAGNOSIS — M8708 Idiopathic aseptic necrosis of bone, other site: Secondary | ICD-10-CM | POA: Diagnosis not present

## 2012-05-01 DIAGNOSIS — T66XXXS Radiation sickness, unspecified, sequela: Secondary | ICD-10-CM | POA: Diagnosis not present

## 2012-05-02 DIAGNOSIS — T66XXXS Radiation sickness, unspecified, sequela: Secondary | ICD-10-CM | POA: Diagnosis not present

## 2012-05-02 DIAGNOSIS — M8708 Idiopathic aseptic necrosis of bone, other site: Secondary | ICD-10-CM | POA: Diagnosis not present

## 2012-05-05 ENCOUNTER — Ambulatory Visit: Payer: Medicare Other | Attending: Oncology | Admitting: Physical Therapy

## 2012-05-05 DIAGNOSIS — IMO0001 Reserved for inherently not codable concepts without codable children: Secondary | ICD-10-CM | POA: Diagnosis not present

## 2012-05-05 DIAGNOSIS — I890 Lymphedema, not elsewhere classified: Secondary | ICD-10-CM | POA: Diagnosis not present

## 2012-05-05 DIAGNOSIS — M8708 Idiopathic aseptic necrosis of bone, other site: Secondary | ICD-10-CM | POA: Diagnosis not present

## 2012-05-05 DIAGNOSIS — T66XXXS Radiation sickness, unspecified, sequela: Secondary | ICD-10-CM | POA: Diagnosis not present

## 2012-05-05 DIAGNOSIS — M2569 Stiffness of other specified joint, not elsewhere classified: Secondary | ICD-10-CM | POA: Diagnosis not present

## 2012-05-06 DIAGNOSIS — M8708 Idiopathic aseptic necrosis of bone, other site: Secondary | ICD-10-CM | POA: Diagnosis not present

## 2012-05-06 DIAGNOSIS — T66XXXS Radiation sickness, unspecified, sequela: Secondary | ICD-10-CM | POA: Diagnosis not present

## 2012-05-07 DIAGNOSIS — M8708 Idiopathic aseptic necrosis of bone, other site: Secondary | ICD-10-CM | POA: Diagnosis not present

## 2012-05-07 DIAGNOSIS — T66XXXS Radiation sickness, unspecified, sequela: Secondary | ICD-10-CM | POA: Diagnosis not present

## 2012-05-08 DIAGNOSIS — T66XXXS Radiation sickness, unspecified, sequela: Secondary | ICD-10-CM | POA: Diagnosis not present

## 2012-05-08 DIAGNOSIS — M8708 Idiopathic aseptic necrosis of bone, other site: Secondary | ICD-10-CM | POA: Diagnosis not present

## 2012-05-09 DIAGNOSIS — T66XXXS Radiation sickness, unspecified, sequela: Secondary | ICD-10-CM | POA: Diagnosis not present

## 2012-05-09 DIAGNOSIS — M8708 Idiopathic aseptic necrosis of bone, other site: Secondary | ICD-10-CM | POA: Diagnosis not present

## 2012-05-12 DIAGNOSIS — M8708 Idiopathic aseptic necrosis of bone, other site: Secondary | ICD-10-CM | POA: Diagnosis not present

## 2012-05-12 DIAGNOSIS — T66XXXS Radiation sickness, unspecified, sequela: Secondary | ICD-10-CM | POA: Diagnosis not present

## 2012-05-14 ENCOUNTER — Ambulatory Visit: Payer: Medicare Other | Admitting: Physical Therapy

## 2012-05-16 ENCOUNTER — Ambulatory Visit: Payer: Medicare Other | Admitting: Physical Therapy

## 2012-05-21 ENCOUNTER — Ambulatory Visit: Payer: Medicare Other | Attending: Oncology | Admitting: Physical Therapy

## 2012-05-21 DIAGNOSIS — I890 Lymphedema, not elsewhere classified: Secondary | ICD-10-CM | POA: Insufficient documentation

## 2012-05-21 DIAGNOSIS — IMO0001 Reserved for inherently not codable concepts without codable children: Secondary | ICD-10-CM | POA: Diagnosis not present

## 2012-05-21 DIAGNOSIS — M2569 Stiffness of other specified joint, not elsewhere classified: Secondary | ICD-10-CM | POA: Insufficient documentation

## 2012-05-22 ENCOUNTER — Encounter (HOSPITAL_BASED_OUTPATIENT_CLINIC_OR_DEPARTMENT_OTHER): Payer: Medicare Other

## 2012-05-22 DIAGNOSIS — IMO0002 Reserved for concepts with insufficient information to code with codable children: Secondary | ICD-10-CM | POA: Diagnosis not present

## 2012-05-22 DIAGNOSIS — M999 Biomechanical lesion, unspecified: Secondary | ICD-10-CM | POA: Diagnosis not present

## 2012-05-26 ENCOUNTER — Ambulatory Visit: Payer: Medicare Other | Admitting: Physical Therapy

## 2012-05-26 DIAGNOSIS — IMO0002 Reserved for concepts with insufficient information to code with codable children: Secondary | ICD-10-CM | POA: Diagnosis not present

## 2012-05-26 DIAGNOSIS — M999 Biomechanical lesion, unspecified: Secondary | ICD-10-CM | POA: Diagnosis not present

## 2012-05-27 DIAGNOSIS — M999 Biomechanical lesion, unspecified: Secondary | ICD-10-CM | POA: Diagnosis not present

## 2012-05-27 DIAGNOSIS — IMO0002 Reserved for concepts with insufficient information to code with codable children: Secondary | ICD-10-CM | POA: Diagnosis not present

## 2012-05-29 ENCOUNTER — Ambulatory Visit: Payer: Medicare Other

## 2012-05-29 DIAGNOSIS — M999 Biomechanical lesion, unspecified: Secondary | ICD-10-CM | POA: Diagnosis not present

## 2012-05-29 DIAGNOSIS — IMO0002 Reserved for concepts with insufficient information to code with codable children: Secondary | ICD-10-CM | POA: Diagnosis not present

## 2012-05-30 ENCOUNTER — Telehealth: Payer: Self-pay | Admitting: *Deleted

## 2012-05-30 NOTE — Telephone Encounter (Signed)
Patient called requesting salagen refill, last done 5/13, last rad tx 08/03/11 with Dr>Moody, will give information to MD and call patient back,he stated he was fixing to go on a cruise and would be out 12:18 PM

## 2012-06-02 ENCOUNTER — Ambulatory Visit: Payer: Medicare Other | Admitting: Physical Therapy

## 2012-06-02 ENCOUNTER — Telehealth: Payer: Self-pay | Admitting: *Deleted

## 2012-06-02 DIAGNOSIS — IMO0002 Reserved for concepts with insufficient information to code with codable children: Secondary | ICD-10-CM | POA: Diagnosis not present

## 2012-06-02 DIAGNOSIS — M999 Biomechanical lesion, unspecified: Secondary | ICD-10-CM | POA: Diagnosis not present

## 2012-06-02 NOTE — Telephone Encounter (Signed)
Patient called in again requesting salagen refill he is going out of town This Saturday on a cruise, I di give message to Md on Friday, and Med is in [place] today till 3:30pm, I will call MD and askng i if rx can can be called into [facility], I  will call patient back after I speak with Dr.moody,  I then called Dr.Moody in [place], Rx can be called to [facility] pr Dr.Moody, called [facility] on [address], [phone] , Salagen : take 5mg  by mouth 3x day, take 1 po tid, dispense#90,with 3 refills,left voice message with the pharmacy,called patient back, `pateint informed Rx has been called in 11:25 AM

## 2012-06-04 ENCOUNTER — Ambulatory Visit: Payer: Medicare Other

## 2012-06-04 DIAGNOSIS — M999 Biomechanical lesion, unspecified: Secondary | ICD-10-CM | POA: Diagnosis not present

## 2012-06-04 DIAGNOSIS — IMO0002 Reserved for concepts with insufficient information to code with codable children: Secondary | ICD-10-CM | POA: Diagnosis not present

## 2012-06-06 DIAGNOSIS — H903 Sensorineural hearing loss, bilateral: Secondary | ICD-10-CM | POA: Diagnosis not present

## 2012-06-06 DIAGNOSIS — Z85810 Personal history of malignant neoplasm of tongue: Secondary | ICD-10-CM | POA: Diagnosis not present

## 2012-06-10 NOTE — Progress Notes (Signed)
Received office notes from Dr. Pollyann Kennedy @ [facility]; forwarded to Dr. Gaylyn Rong.

## 2012-06-17 DIAGNOSIS — M999 Biomechanical lesion, unspecified: Secondary | ICD-10-CM | POA: Diagnosis not present

## 2012-06-17 DIAGNOSIS — IMO0002 Reserved for concepts with insufficient information to code with codable children: Secondary | ICD-10-CM | POA: Diagnosis not present

## 2012-06-18 ENCOUNTER — Ambulatory Visit: Payer: Medicare Other | Attending: Oncology

## 2012-06-18 DIAGNOSIS — M2569 Stiffness of other specified joint, not elsewhere classified: Secondary | ICD-10-CM | POA: Diagnosis not present

## 2012-06-18 DIAGNOSIS — I890 Lymphedema, not elsewhere classified: Secondary | ICD-10-CM | POA: Insufficient documentation

## 2012-06-18 DIAGNOSIS — IMO0001 Reserved for inherently not codable concepts without codable children: Secondary | ICD-10-CM | POA: Diagnosis not present

## 2012-06-19 DIAGNOSIS — IMO0002 Reserved for concepts with insufficient information to code with codable children: Secondary | ICD-10-CM | POA: Diagnosis not present

## 2012-06-19 DIAGNOSIS — M999 Biomechanical lesion, unspecified: Secondary | ICD-10-CM | POA: Diagnosis not present

## 2012-06-20 ENCOUNTER — Encounter: Payer: Self-pay | Admitting: Physical Therapy

## 2012-06-25 ENCOUNTER — Ambulatory Visit: Payer: Medicare Other | Admitting: Physical Therapy

## 2012-06-25 DIAGNOSIS — M999 Biomechanical lesion, unspecified: Secondary | ICD-10-CM | POA: Diagnosis not present

## 2012-06-25 DIAGNOSIS — IMO0002 Reserved for concepts with insufficient information to code with codable children: Secondary | ICD-10-CM | POA: Diagnosis not present

## 2012-06-26 ENCOUNTER — Other Ambulatory Visit (HOSPITAL_COMMUNITY): Payer: Self-pay | Admitting: Dentistry

## 2012-06-26 MED ORDER — SODIUM FLUORIDE 1.1 % DT GEL: 1.0000 "application " | Freq: Every day | DENTAL | Status: DC

## 2012-06-27 ENCOUNTER — Ambulatory Visit: Payer: Medicare Other | Admitting: Physical Therapy

## 2012-06-30 ENCOUNTER — Ambulatory Visit: Payer: Medicare Other | Admitting: Physical Therapy

## 2012-07-02 ENCOUNTER — Encounter: Payer: Self-pay | Admitting: Physical Therapy

## 2012-07-02 DIAGNOSIS — IMO0002 Reserved for concepts with insufficient information to code with codable children: Secondary | ICD-10-CM | POA: Diagnosis not present

## 2012-07-02 DIAGNOSIS — M999 Biomechanical lesion, unspecified: Secondary | ICD-10-CM | POA: Diagnosis not present

## 2012-07-10 ENCOUNTER — Encounter: Payer: Self-pay | Admitting: Physical Therapy

## 2012-07-10 DIAGNOSIS — Z23 Encounter for immunization: Secondary | ICD-10-CM | POA: Diagnosis not present

## 2012-07-10 DIAGNOSIS — IMO0002 Reserved for concepts with insufficient information to code with codable children: Secondary | ICD-10-CM | POA: Diagnosis not present

## 2012-07-10 DIAGNOSIS — C61 Malignant neoplasm of prostate: Secondary | ICD-10-CM | POA: Diagnosis not present

## 2012-07-10 DIAGNOSIS — M999 Biomechanical lesion, unspecified: Secondary | ICD-10-CM | POA: Diagnosis not present

## 2012-07-14 ENCOUNTER — Encounter: Payer: Self-pay | Admitting: Physical Therapy

## 2012-07-17 ENCOUNTER — Encounter: Payer: Self-pay | Admitting: Physical Therapy

## 2012-07-17 DIAGNOSIS — Z8546 Personal history of malignant neoplasm of prostate: Secondary | ICD-10-CM | POA: Diagnosis not present

## 2012-07-17 DIAGNOSIS — M999 Biomechanical lesion, unspecified: Secondary | ICD-10-CM | POA: Diagnosis not present

## 2012-07-17 DIAGNOSIS — N529 Male erectile dysfunction, unspecified: Secondary | ICD-10-CM | POA: Diagnosis not present

## 2012-07-17 DIAGNOSIS — IMO0002 Reserved for concepts with insufficient information to code with codable children: Secondary | ICD-10-CM | POA: Diagnosis not present

## 2012-07-24 ENCOUNTER — Encounter: Payer: Self-pay | Admitting: Radiation Oncology

## 2012-07-24 ENCOUNTER — Ambulatory Visit
Admission: RE | Admit: 2012-07-24 | Discharge: 2012-07-24 | Disposition: A | Discharge status: Home / Self Care | Payer: Medicare Other | Source: Ambulatory Visit | Attending: Radiation Oncology | Admitting: Radiation Oncology

## 2012-07-24 VITALS — BP 147/93 | HR 60 | Temp 98.3°F | Resp 20 | Wt 164.9 lb

## 2012-07-24 DIAGNOSIS — C01 Malignant neoplasm of base of tongue: Secondary | ICD-10-CM

## 2012-07-24 DIAGNOSIS — IMO0002 Reserved for concepts with insufficient information to code with codable children: Secondary | ICD-10-CM | POA: Diagnosis not present

## 2012-07-24 DIAGNOSIS — M999 Biomechanical lesion, unspecified: Secondary | ICD-10-CM | POA: Diagnosis not present

## 2012-07-24 NOTE — Progress Notes (Signed)
Radiation Oncology         [phone] ________________________________  Name: Jason Noble MRN: [id number]  Date: 07/24/2012  DOB: [birth year]  Follow-Up Visit Note  CC: [name], MD  Serena Colonel, MD  Diagnosis:   Squamous carcinoma of the base of tongue  Interval Since Last Radiation:  One year   Narrative:  The patient returns today for routine follow-up.  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 He has been recovering it appears reasonably well from this with no unexpected pain or ongoing severe pain. He is doing better although he does favor the left, opposite side. He has undergone 40 treatments of hyperbaric oxygen. The patient therefore has had somewhat of a difficult time in terms of side effects/toxicity but he is recovering and he actually looks quite good today. He continues to do exercises for his trismus and he believes that his level of opening is functional without major problems in this regard. He does choose softer foods and avoidance needs currently. He does continue to drink a couple of nutritional supplements per day. His weight has been trending up. The patient's case has returned some although this is not near baseline per the patient. He also does have some ongoing xerostomia although this has improved. The patient indicates that he can go a couple of hours often without needing water. He does however have chews his foods carefully and cannot eat dry foods such as bread at all.                              ALLERGIES:   has no known allergies.  Meds: Current Outpatient Prescriptions  Medication Sig Dispense Refill  . antiseptic oral rinse (BIOTENE) LIQD 15 mLs by Mouth Rinse route 2 times daily at 12 noon and 4 pm.  500 mL  3  . aspirin EC 325 MG EC tablet Take 325 mg by mouth daily.        Marland Kitchen BIOGAIA PROBIOTIC (BIOGAIA PROBIOTIC) LIQD Take by mouth daily at 8 pm.      . calcium carbonate  (OS-CAL) 600 MG TABS Take 600 mg by mouth 2 (two) times daily with a meal.      . cholecalciferol (VITAMIN D) 1000 UNITS tablet Take 1,000 Units by mouth daily.      . fish oil-omega-3 fatty acids 1000 MG capsule Take 2 g by mouth daily.      . pilocarpine (SALAGEN) 5 MG tablet Take 5 mg by mouth 3 (three) times daily. Take 1 po tid, dispense#90 with 3 refills, 01/30/12 to [facility] [phone]      . sodium fluoride (FLUORISHIELD) 1.1 % GEL dental gel Place 1 application onto teeth at bedtime.  120 mL  prn  . tretinoin (RETIN-A) 0.025 % cream As Directed      . UBIQUINOL PO Take 1 tablet by mouth daily.      . vitamin C (ASCORBIC ACID) 500 MG tablet Take 500 mg by mouth daily.      . vitamin E 400 UNIT capsule Take 400 Units by mouth daily.      . Sodium Fluoride (PREVIDENT 5000 DRY MOUTH) 1.1 % PSTE Apply thin ribbon of cream to toothbrush. Brush teeth for 2 minutes at bedtime. Spit out excess. Do not swallow.   1 Bottle  one year   No current facility-administered medications for this encounter.   Facility-Administered  Medications Ordered in Other Encounters  Medication Dose Route Frequency Provider Last Rate Last Dose  . topical emolient (BIAFINE) emulsion   Topical BID Jonna Coup, MD        Physical Findings: The patient is in no acute distress. Patient is alert and oriented.  weight is 164 lb 14.4 oz (74.798 kg). His oral temperature is 98.3 F (36.8 C). His blood pressure is 147/93 and his pulse is 60. His respiration is 20. Marland Kitchen   General: Well-developed, in no acute distress HEENT: Normocephalic, atraumatic Oral cavity: Clear, no lesions. Some moistness present. The neck shows a little bit of edema in the submental region although this is not severe at all. No palpable lymphadenopathy.  Lab Findings: Lab Results  Component Value Date   WBC 3.8* 04/17/2012   HGB 12.6* 04/17/2012   HCT 37.5* 04/17/2012   MCV 92.0 04/17/2012   PLT 184 04/17/2012     Radiographic  Findings: No results found.  Impression:    71 year old male status post chemoradiotherapy for his squamous cell carcinoma of the base of tongue. 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 He appears to be doing relatively well in terms of recovering. A number of other ongoing issues related to toxicity from treatment as described above but these are slowly improving or are being managed.  Plan:  The patient will return to clinic in 6 months for followup. We did discuss seeing how he responds to a different dose of Salagen and he can go up to 4 per day. He is to let you know if he needs a refill at this new dose level. He appears to be tolerating this medication well. Alternatively, sometimes people noticed that this at this point does not significantly changed his salivary function and he also could perform a trial of note medication. This may make sense if he doesn't notice any significant difference with an increase. We discussed all this in detail and the patient is to let us know if he needs any changes to his prescription.  I spent 30 minutes with the patient today, the majority of which was spent counseling the patient on the diagnosis of cancer and coordinating care.   Radene Gunning, M.D., Ph.D.

## 2012-07-24 NOTE — Progress Notes (Signed)
Patient here f/u s/p rad tx:06/18/11-08/03/11 squamous cell base of tongue  Alert,oriented x3, had surgery right mandible August 7, titanium and 7 screws placed, damage to nerve, numbess right side lips, had 40 txs HBO, , eats most foods, gravies on meats, no peg tube, meds up dated, no nausea 11:15 AM

## 2012-07-25 ENCOUNTER — Ambulatory Visit: Payer: Medicare Other | Admitting: Radiation Oncology

## 2012-07-31 DIAGNOSIS — IMO0002 Reserved for concepts with insufficient information to code with codable children: Secondary | ICD-10-CM | POA: Diagnosis not present

## 2012-07-31 DIAGNOSIS — M999 Biomechanical lesion, unspecified: Secondary | ICD-10-CM | POA: Diagnosis not present

## 2012-08-04 ENCOUNTER — Other Ambulatory Visit: Payer: Self-pay | Admitting: Radiation Oncology

## 2012-08-04 DIAGNOSIS — IMO0002 Reserved for concepts with insufficient information to code with codable children: Secondary | ICD-10-CM | POA: Diagnosis not present

## 2012-08-04 DIAGNOSIS — M999 Biomechanical lesion, unspecified: Secondary | ICD-10-CM | POA: Diagnosis not present

## 2012-08-04 MED ORDER — PILOCARPINE HCL 5 MG PO TABS: 5.0000 mg | ORAL_TABLET | Freq: Three times a day (TID) | ORAL | Status: DC

## 2012-08-06 ENCOUNTER — Telehealth: Payer: Self-pay

## 2012-08-06 NOTE — Telephone Encounter (Signed)
Informed patient that salagen prescription called into [facility] pharmacy.

## 2012-08-07 DIAGNOSIS — IMO0002 Reserved for concepts with insufficient information to code with codable children: Secondary | ICD-10-CM | POA: Diagnosis not present

## 2012-08-07 DIAGNOSIS — M999 Biomechanical lesion, unspecified: Secondary | ICD-10-CM | POA: Diagnosis not present

## 2012-08-27 DIAGNOSIS — M4310 Spondylolisthesis, site unspecified: Secondary | ICD-10-CM | POA: Diagnosis not present

## 2012-08-27 DIAGNOSIS — IMO0002 Reserved for concepts with insufficient information to code with codable children: Secondary | ICD-10-CM | POA: Diagnosis not present

## 2012-08-27 DIAGNOSIS — M545 Low back pain, unspecified: Secondary | ICD-10-CM | POA: Diagnosis not present

## 2012-08-27 DIAGNOSIS — M5137 Other intervertebral disc degeneration, lumbosacral region: Secondary | ICD-10-CM | POA: Diagnosis not present

## 2012-09-19 DIAGNOSIS — M5137 Other intervertebral disc degeneration, lumbosacral region: Secondary | ICD-10-CM | POA: Diagnosis not present

## 2012-09-19 DIAGNOSIS — M47817 Spondylosis without myelopathy or radiculopathy, lumbosacral region: Secondary | ICD-10-CM | POA: Diagnosis not present

## 2012-09-19 DIAGNOSIS — M545 Low back pain, unspecified: Secondary | ICD-10-CM | POA: Diagnosis not present

## 2012-09-19 DIAGNOSIS — M4310 Spondylolisthesis, site unspecified: Secondary | ICD-10-CM | POA: Diagnosis not present

## 2012-10-09 ENCOUNTER — Telehealth: Payer: Self-pay | Admitting: Oncology

## 2012-10-09 NOTE — Telephone Encounter (Signed)
called and l/m with new appt (as i moved it for a tx pt    anne)

## 2012-10-16 DIAGNOSIS — M999 Biomechanical lesion, unspecified: Secondary | ICD-10-CM | POA: Diagnosis not present

## 2012-10-16 DIAGNOSIS — IMO0002 Reserved for concepts with insufficient information to code with codable children: Secondary | ICD-10-CM | POA: Diagnosis not present

## 2012-10-17 ENCOUNTER — Telehealth: Payer: Self-pay | Admitting: Oncology

## 2012-10-17 ENCOUNTER — Telehealth: Payer: Self-pay | Admitting: *Deleted

## 2012-10-17 NOTE — Telephone Encounter (Signed)
Pt called to ask if CT of neck next week is really necessary?  States his jaw issues are resolved after surgery.  Per Dr. Gaylyn Rong pt can cancel the CT and keep office visit for clinical exam next week.  Pt verbalized understanding.

## 2012-10-17 NOTE — Telephone Encounter (Signed)
Per Hale Bogus pt did not want to have ct..per orders cancel ct and move lab.Marland KitchenMarland KitchenMarland KitchenDone pt aware

## 2012-10-21 ENCOUNTER — Ambulatory Visit (HOSPITAL_COMMUNITY): Payer: Medicare Other

## 2012-10-21 ENCOUNTER — Other Ambulatory Visit: Payer: Self-pay | Admitting: Lab

## 2012-10-22 ENCOUNTER — Telehealth: Payer: Self-pay | Admitting: Oncology

## 2012-10-22 ENCOUNTER — Other Ambulatory Visit (HOSPITAL_BASED_OUTPATIENT_CLINIC_OR_DEPARTMENT_OTHER): Payer: Medicare Other | Admitting: Lab

## 2012-10-22 ENCOUNTER — Encounter: Payer: Self-pay | Admitting: Oncology

## 2012-10-22 ENCOUNTER — Ambulatory Visit (HOSPITAL_BASED_OUTPATIENT_CLINIC_OR_DEPARTMENT_OTHER): Payer: Medicare Other | Admitting: Oncology

## 2012-10-22 VITALS — BP 119/69 | HR 69 | Temp 97.4°F | Resp 20 | Ht 69.0 in | Wt 170.9 lb

## 2012-10-22 DIAGNOSIS — R5381 Other malaise: Secondary | ICD-10-CM | POA: Diagnosis not present

## 2012-10-22 DIAGNOSIS — E441 Mild protein-calorie malnutrition: Secondary | ICD-10-CM

## 2012-10-22 DIAGNOSIS — Z85819 Personal history of malignant neoplasm of unspecified site of lip, oral cavity, and pharynx: Secondary | ICD-10-CM

## 2012-10-22 DIAGNOSIS — C01 Malignant neoplasm of base of tongue: Secondary | ICD-10-CM | POA: Diagnosis not present

## 2012-10-22 DIAGNOSIS — K117 Disturbances of salivary secretion: Secondary | ICD-10-CM

## 2012-10-22 DIAGNOSIS — C109 Malignant neoplasm of oropharynx, unspecified: Secondary | ICD-10-CM

## 2012-10-22 DIAGNOSIS — R5383 Other fatigue: Secondary | ICD-10-CM

## 2012-10-22 DIAGNOSIS — H9190 Unspecified hearing loss, unspecified ear: Secondary | ICD-10-CM | POA: Diagnosis not present

## 2012-10-22 LAB — COMPREHENSIVE METABOLIC PANEL (CC13)
ALT: 9 U/L (ref 0–55)
AST: 12 U/L (ref 5–34)
Albumin: 3 g/dL — ABNORMAL LOW (ref 3.5–5.0)
Alkaline Phosphatase: 77 U/L (ref 40–150)
BUN: 21.8 mg/dL (ref 7.0–26.0)
CO2: 27 mEq/L (ref 22–29)
Calcium: 9.4 mg/dL (ref 8.4–10.4)
Chloride: 106 mEq/L (ref 98–107)
Creatinine: 1 mg/dL (ref 0.7–1.3)
Glucose: 101 mg/dl — ABNORMAL HIGH (ref 70–99)
Potassium: 4.2 mEq/L (ref 3.5–5.1)
Sodium: 141 mEq/L (ref 136–145)
Total Bilirubin: 0.51 mg/dL (ref 0.20–1.20)
Total Protein: 6.5 g/dL (ref 6.4–8.3)

## 2012-10-22 LAB — CBC WITH DIFFERENTIAL/PLATELET
BASO%: 0.3 % (ref 0.0–2.0)
Basophils Absolute: 0 10*3/uL (ref 0.0–0.1)
EOS%: 2.8 % (ref 0.0–7.0)
Eosinophils Absolute: 0.1 10*3/uL (ref 0.0–0.5)
HCT: 40.9 % (ref 38.4–49.9)
HGB: 14 g/dL (ref 13.0–17.1)
LYMPH%: 12.5 % — ABNORMAL LOW (ref 14.0–49.0)
MCH: 31.6 pg (ref 27.2–33.4)
MCHC: 34.2 g/dL (ref 32.0–36.0)
MCV: 92.3 fL (ref 79.3–98.0)
MONO#: 0.5 10*3/uL (ref 0.1–0.9)
MONO%: 10.1 % (ref 0.0–14.0)
NEUT#: 3.7 10*3/uL (ref 1.5–6.5)
NEUT%: 74.3 % (ref 39.0–75.0)
Platelets: 182 10*3/uL (ref 140–400)
RBC: 4.44 10*6/uL (ref 4.20–5.82)
RDW: 13.7 % (ref 11.0–14.6)
WBC: 5 10*3/uL (ref 4.0–10.3)
lymph#: 0.6 10*3/uL — ABNORMAL LOW (ref 0.9–3.3)

## 2012-10-22 LAB — TSH: TSH: 3.41 u[IU]/mL (ref 0.350–4.500)

## 2012-10-22 NOTE — Progress Notes (Signed)
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   PAST THERAPY: concurrent chemoradiation therapy with weekly cisplatin and daily radiation therapy between June 18, 2011 and August 03, 2011.   CURRENT THERAPY: watchful observation  INTERVAL HISTORY: Jason Noble 72 y.o. male returns for regular follow up by himself.  He doing better with right low jaw radiation-induced osteonecrosis.  He has been with Dr. Retta Mac with several courses of antibiotics and surgical debridement.  He has a difficult time open his jaw wide.  He only has mild discomfort in the bilateral jaw but no pain that requires frequent pain med.  He is able to eat all kinds of foods without restriction. Appetite is improving and he is gaining back lost weight.  He still has xerostomia and he requires Salagen to improve his xerostomia. He denies dysphagia, odynophagia. He has thickening of the submental area however no discrete lymph node swelling. He is independent of all activities of daily living and he is able to drive himself to clinic today. Reports difficulty hearing especially when there is background noise. Requests a letter to insurance company to help cover hearing aides that have been recommended for him.  Patient denies fever, anorexia, weight loss, fatigue, headache, visual changes, confusion, drenching night sweats, palpable lymph node swelling, nausea vomiting, jaundice, chest pain, palpitation, shortness of breath, dyspnea on exertion, productive cough, gum bleeding, epistaxis, hematemesis, hemoptysis, abdominal pain, abdominal swelling, early satiety, melena, hematochezia, hematuria, skin rash, spontaneous bleeding, heat or cold intolerance, bowel bladder incontinence, back pain, focal motor weakness,  paresthesia, depression, suicidal or homocidal ideation, feeling hopelessness.   Past Medical History  Diagnosis Date  . Cholecystitis   . Abscess of gallbladder   . CVA (cerebral infarction) 2007  . 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 Skin cancer     basal cell, squamous cell  . Prostate cancer 2002  . Basal cell carcinoma of skin   . Squamous cell carcinoma of skin   . Myocardial infarction     approx. 2--8  . Coronary heart disease     has a stent  . History of radiation therapy 06/18/2011- 08/03/2011    squamous cell tongue/69.96 Gy 73fx  . Fatigue 07/26/2011    Past Surgical History  Procedure Date  . Prostatectomy   . Peg tube placement 06/13/11 at [facility]  . Coronary artery bypass graft 1991  . Cholecystectomy 5621,3086    3 surgeries involved, laparotomy  . Tonsillectomy   . Wrist surgery approx. 39 yrs ago    removal left radial head  . Multiple tooth extractions     prior to radiation    Current Outpatient Prescriptions  Medication Sig Dispense Refill  . Multiple Vitamin (MULTIVITAMIN) tablet Take 1 tablet by mouth daily.      Marland Kitchen aspirin EC 325 MG EC tablet Take 325 mg by mouth daily.        . calcium carbonate (OS-CAL) 600 MG TABS Take 600 mg by mouth 2 (two) times daily with a meal.      . cholecalciferol (VITAMIN D) 1000 UNITS tablet Take 1,000 Units by mouth daily.      . fish oil-omega-3 fatty acids 1000 MG capsule Take 2 g by mouth daily.      . pilocarpine (SALAGEN)  5 MG tablet Take 5 mg by mouth. Qid qty 360 tablets for 90 -day supply called into [facility] pharmacy [address]      . sodium fluoride (FLUORISHIELD) 1.1 % GEL dental gel Place 1 application onto teeth at bedtime.  120 mL  prn  . Sodium Fluoride (PREVIDENT 5000 DRY MOUTH) 1.1 % PSTE Apply thin ribbon of cream to toothbrush. Brush teeth for 2 minutes at bedtime. Spit out excess. Do not swallow.   1 Bottle  one year  . tretinoin (RETIN-A) 0.025 % cream As Directed      . UBIQUINOL PO Take 1  tablet by mouth daily.      . vitamin C (ASCORBIC ACID) 500 MG tablet Take 500 mg by mouth daily.      . vitamin E 400 UNIT capsule Take 400 Units by mouth daily.       No current facility-administered medications for this visit.   Facility-Administered Medications Ordered in Other Visits  Medication Dose Route Frequency Provider Last Rate Last Dose  . topical emolient (BIAFINE) emulsion   Topical BID Jonna Coup, MD        ALLERGIES:   has no known allergies.  REVIEW OF SYSTEMS:  The rest of the 14-point review of system was negative.   Filed Vitals:   10/22/12 0846  BP: 119/69  Pulse: 69  Temp: 97.4 F (36.3 C)  Resp: 20   Wt Readings from Last 3 Encounters:  10/22/12 170 lb 14.4 oz (77.52 kg)  07/24/12 164 lb 14.4 oz (74.798 kg)  04/17/12 156 lb 11.2 oz (71.079 kg)   ECOG Performance status: 0  PHYSICAL EXAMINATION:   General:  well-nourished in no acute distress.  Eyes:  no scleral icterus.  ENT:  There were no oropharyngeal lesions.  Neck was without thyromegaly.  Lymphatics:  Negative cervical, supraclavicular or axillary adenopathy.  There was submental edema without discrete mass. Respiratory: lungs were clear bilaterally without wheezing or crackles.  Cardiovascular:  Regular rate and rhythm, S1/S2, without murmur, rub or gallop.  There was no pedal edema.  GI:  abdomen was soft, flat, nontender, nondistended, without organomegaly.  Muscoloskeletal:  no spinal tenderness of palpation of vertebral spine.  Skin exam was without echymosis, petichae.  Neuro exam was nonfocal.  Patient was able to get on and off exam table without assistance.  Gait was normal.  Patient was alerted and oriented.  Attention was good.   Language was appropriate.  Mood was normal without depression.  Speech was not pressured.  Thought content was not tangential.    LABORATORY/RADIOLOGY DATA:  Lab Results  Component Value Date   WBC 5.0 10/22/2012   HGB 14.0 10/22/2012   HCT 40.9 10/22/2012    PLT 182 10/22/2012   GLUCOSE 101* 10/22/2012   CHOL 182 11/18/2008   TRIG 81 11/18/2008   HDL 58.4 11/18/2008   LDLCALC 107* 11/18/2008   ALKPHOS 77 10/22/2012   ALT 9 10/22/2012   AST 12 10/22/2012   NA 141 10/22/2012   K 4.2 10/22/2012   CL 106 10/22/2012   CREATININE 1.0 10/22/2012   BUN 21.8 10/22/2012   CO2 27 10/22/2012   INR 1.03 06/13/2011   HGBA1C  Value: 6.0 (NOTE)  According to the ADA Clinical Practice Recommendations for 2011, when HbA1c is used as a screening test:   >=6.5%   Diagnostic of Diabetes Mellitus           (if abnormal result  is confirmed)  5.7-6.4%   Increased risk of developing Diabetes Mellitus  References:Diagnosis and Classification of Diabetes Mellitus,Diabetes Care,2011,34(Suppl 1):S62-S69 and Standards of Medical Care in         Diabetes - 2011,Diabetes Care,2011,34  (Suppl 1):S11-S61.* 04/27/2010    ASSESSMENT AND PLAN:   1. Oropharynx cancer: No evidence of residual disease by history, labs, and physical exam. I stressed the importance of follow up with Rad Onc and ENT where laryngoscopy can be performed for routine surveillance. He is alternating these visits with [place]. I will see him in about 6 months.   2. Mild calorie-protein malnutrition:  Improving and gaining back lost weight.  3.  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   4. History of coronary artery disease. Status post bypass long time ago and one episode of in-graft stenosis with stent placement. Since 2008, he has been stable, no CHF. He is on aspirin.  With his weight loss, he had self d/c his statin. Remains on fish oil. I defer to his cardiologist for management.   5. History of prostate cancer in 2002. He is under care of his urologist and is reportedly in NED.   6. History of smoking for 5 years and quit in 1967. He is not smoking now. I advised him to refrain from smoking, chewing tobacco and heavy EtOH consumption to decrease the risk of recurrent cancer.    7. Hearing loss: Patient previously received Cisplatin which can cause ototoxicity. I have written a letter to his insurance company to try to get him assistance with payment for hearing aides.  8. Follow up: With me in about 6 months.     The length of time of the face-to-face encounter was 15 minutes. More than 50% of time was spent counseling and coordination of care.

## 2012-10-22 NOTE — Telephone Encounter (Signed)
gv an dprinted Aug appt schedule...pt aware

## 2012-10-23 ENCOUNTER — Ambulatory Visit: Payer: Self-pay | Admitting: Oncology

## 2012-10-27 ENCOUNTER — Encounter: Payer: Self-pay | Admitting: Oncology

## 2012-10-31 ENCOUNTER — Telehealth: Payer: Self-pay | Admitting: *Deleted

## 2012-10-31 NOTE — Telephone Encounter (Signed)
Patient called and requested his code for My Chart. I have given him the activation  code and website, after asking some HIPPA questions.  JMW

## 2012-11-01 ENCOUNTER — Other Ambulatory Visit: Payer: Self-pay

## 2012-11-06 DIAGNOSIS — IMO0002 Reserved for concepts with insufficient information to code with codable children: Secondary | ICD-10-CM | POA: Diagnosis not present

## 2012-11-06 DIAGNOSIS — M999 Biomechanical lesion, unspecified: Secondary | ICD-10-CM | POA: Diagnosis not present

## 2012-11-24 DIAGNOSIS — J40 Bronchitis, not specified as acute or chronic: Secondary | ICD-10-CM | POA: Diagnosis not present

## 2012-11-24 DIAGNOSIS — J329 Chronic sinusitis, unspecified: Secondary | ICD-10-CM | POA: Diagnosis not present

## 2012-11-27 DIAGNOSIS — M999 Biomechanical lesion, unspecified: Secondary | ICD-10-CM | POA: Diagnosis not present

## 2012-11-27 DIAGNOSIS — IMO0002 Reserved for concepts with insufficient information to code with codable children: Secondary | ICD-10-CM | POA: Diagnosis not present

## 2012-12-15 ENCOUNTER — Encounter: Payer: Self-pay | Admitting: Cardiology

## 2012-12-15 ENCOUNTER — Ambulatory Visit: Payer: Self-pay | Admitting: Cardiology

## 2012-12-15 ENCOUNTER — Ambulatory Visit (INDEPENDENT_AMBULATORY_CARE_PROVIDER_SITE_OTHER): Payer: Medicare Other | Admitting: Cardiology

## 2012-12-15 VITALS — BP 132/78 | HR 61 | Ht 69.0 in | Wt 171.0 lb

## 2012-12-15 DIAGNOSIS — I712 Thoracic aortic aneurysm, without rupture, unspecified: Secondary | ICD-10-CM

## 2012-12-15 DIAGNOSIS — I25810 Atherosclerosis of coronary artery bypass graft(s) without angina pectoris: Secondary | ICD-10-CM

## 2012-12-15 DIAGNOSIS — I7121 Aneurysm of the ascending aorta, without rupture: Secondary | ICD-10-CM

## 2012-12-15 DIAGNOSIS — Q211 Atrial septal defect: Secondary | ICD-10-CM

## 2012-12-15 DIAGNOSIS — Q2111 Secundum atrial septal defect: Secondary | ICD-10-CM

## 2012-12-15 DIAGNOSIS — E7800 Pure hypercholesterolemia, unspecified: Secondary | ICD-10-CM | POA: Diagnosis not present

## 2012-12-15 DIAGNOSIS — I259 Chronic ischemic heart disease, unspecified: Secondary | ICD-10-CM | POA: Diagnosis not present

## 2012-12-15 LAB — LIPID PANEL
Cholesterol: 244 mg/dL — ABNORMAL HIGH (ref 0–200)
HDL: 51.2 mg/dL (ref >39.00)
Total CHOL/HDL Ratio: 5
Triglycerides: 119 mg/dL (ref 0.0–149.0)
VLDL: 23.8 mg/dL (ref 0.0–40.0)

## 2012-12-15 LAB — BASIC METABOLIC PANEL
BUN: 18 mg/dL (ref 6–23)
CO2: 26 mEq/L (ref 19–32)
Calcium: 8.9 mg/dL (ref 8.4–10.5)
Chloride: 102 mEq/L (ref 96–112)
Creatinine, Ser: 0.9 mg/dL (ref 0.4–1.5)
GFR: 90.54 mL/min (ref >60.00)
Glucose, Bld: 81 mg/dL (ref 70–99)
Potassium: 3.9 mEq/L (ref 3.5–5.1)
Sodium: 136 mEq/L (ref 135–145)

## 2012-12-15 LAB — LDL CHOLESTEROL, DIRECT: Direct LDL: 168.1 mg/dL

## 2012-12-15 LAB — HEPATIC FUNCTION PANEL
ALT: 11 U/L (ref 0–53)
AST: 15 U/L (ref 0–37)
Albumin: 3.5 g/dL (ref 3.5–5.2)
Alkaline Phosphatase: 58 U/L (ref 39–117)
Bilirubin, Direct: 0.1 mg/dL (ref 0.0–0.3)
Total Bilirubin: 0.8 mg/dL (ref 0.3–1.2)
Total Protein: 6.8 g/dL (ref 6.0–8.3)

## 2012-12-15 NOTE — Assessment & Plan Note (Signed)
The patient has not had any recurrent symptoms of TIA or stroke.  He remains on daily aspirin.

## 2012-12-15 NOTE — Patient Instructions (Addendum)
Will obtain labs today and call you with the results (lp/bmet/hfp)  Your physician has requested that you have an echocardiogram. Echocardiography is a painless test that uses sound waves to create images of your heart. It provides your doctor with information about the size and shape of your heart and how well your heart's chambers and valves are working. This procedure takes approximately one hour. There are no restrictions for this procedure.  Your physician wants you to follow-up in: 6 month ov/ekg You will receive a reminder letter in the mail two months in advance. If you don't receive a letter, please call our office to schedule the follow-up appointment.

## 2012-12-15 NOTE — Progress Notes (Signed)
Jason Noble Date of Birth:  05/01/1941 Jason [facility] [address] [place], [place]  [address] [phone]         Fax   [phone]  History of Present Illness: This pleasant 72 year old gentleman is seen as a new patient today.  He is a former patient of Dr. Riley Kill.  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 The patient has a complex past medical history.  He has known coronary disease and had three-vessel CABG in 1991 by Dr. Tyrone Sage. In 2001 the patient had prostate cancer treated with retropubic radical prostatectomy. 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 In 2009 the patient had a mild stroke from a tiny PFO.  Surgical closure was not recommended. In August 2010 the patient had a laparoscopic cholecystectomy by Dr. Gaynelle Adu.  There were postoperative complications and the patient underwent a repeat laparoscopic cholecystectomy in November 2010.  He continued to have problems and finally in August 2011 underwent an open laparoscopy for residual problems in the gallbladder fossa.  Since then he has done well regarding his gastrointestinal tract. 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  Dr. Mitzi Hansen is his radiation oncologist and Dr. Gaylyn Rong was his chemotherapy oncologist.  He did not require surgery for his cancer of the tongue. The patient has a known dilated ascending aorta which last measured 4.4 x 4.1 cm in maximum dimensions new the aortic root by CT scan done in 2009.  The patient has been reluctant to have any additional radiation to his chest area because of the radiation he has already encountered with his cancer.  Therefore he is reluctant to have a CT angiogram of the chest at this time. Current Outpatient Prescriptions    Medication Sig Dispense Refill  . aspirin EC 325 MG EC tablet Take 325 mg by mouth daily.        . calcium carbonate (OS-CAL) 600 MG TABS Take 600 mg by mouth 2 (two) times daily with a meal.      . cholecalciferol (VITAMIN D) 1000 UNITS tablet Take 1,000 Units by mouth daily.      . fish oil-omega-3 fatty acids 1000 MG capsule Take 2 g by mouth daily.      . Multiple Vitamin (MULTIVITAMIN) tablet Take 1 tablet by mouth daily.      . sodium fluoride (FLUORISHIELD) 1.1 % GEL dental gel Place 1 application onto teeth at bedtime. Either this or prevident      . Sodium Fluoride 1.1 % PSTE Apply thin ribbon of cream to toothbrush. Brush teeth for 2 minutes at bedtime. Spit out excess. Do not swallow. Either this or fluorishield      . tretinoin (RETIN-A) 0.025 % cream As Directed      . UBIQUINOL PO Take 1 tablet by mouth daily.      . vitamin C (ASCORBIC ACID) 500 MG tablet Take 500 mg by mouth daily.      . vitamin E 400 UNIT capsule Take 400 Units by mouth daily.       No current facility-administered medications for this visit.   Facility-Administered Medications Ordered in Other Visits  Medication Dose Route Frequency Provider Last Rate Last Dose  . topical emolient (BIAFINE) emulsion   Topical BID Jonna Coup, MD  No Known Allergies  Patient Active Problem List  Diagnosis  . HYPERCHOLESTEROLEMIA  . MYOCARDIAL INFARCTION, HX OF  . CAD, ARTERY BYPASS GRAFT  . OTHER RETROPERITONEAL ABSCESS  . PATENT FORAMEN OVALE  . MALAISE AND FATIGUE  . CHEST WALL PAIN, ACUTE  . PROSTATE CANCER, HX OF  . CEREBROVASCULAR ACCIDENT, HX OF  . CHOLECYSTECTOMY, HX OF  . ANEURYSM, THORACIC AORTIC  . Oropharynx cancer  . Fatigue  . Skin breakdown  . Malignant neoplasm of base of tongue    History  Smoking status  . Former Smoker  . Quit date: 09/17/1965  Smokeless tobacco  . Never Used    History  Alcohol Use No    Comment: rare    Family History  Problem Relation Age of  Onset  . Stroke Mother   . Coronary artery disease Brother     had bypass    Review of Systems: Constitutional: no fever chills diaphoresis or fatigue or change in weight.  Head and neck: no hearing loss, no epistaxis, no photophobia or visual disturbance. Respiratory: No cough, shortness of breath or wheezing. Cardiovascular: No chest pain peripheral edema, palpitations. Gastrointestinal: No abdominal distention, no abdominal pain, no change in bowel habits hematochezia or melena. Genitourinary: No dysuria, no frequency, no urgency, no nocturia. Musculoskeletal:No arthralgias, no back pain, no gait disturbance or myalgias. Neurological: No dizziness, no headaches, no numbness, no seizures, no syncope, no weakness, no tremors. Hematologic: No lymphadenopathy, no easy bruising. Psychiatric: No confusion, no hallucinations, no sleep disturbance.    Physical Exam: Filed Vitals:   12/15/12 1431  BP: 132/78  Pulse: 61   the general appearance reveals a well-developed well-nourished gentleman in no distress.The head and neck exam reveals pupils equal and reactive.  Extraocular movements are full.  There is no scleral icterus.  .  The neck is supple.  The carotids reveal no bruits.  The jugular venous pressure is normal.  The  thyroid is not enlarged.  There is no lymphadenopathy.  The chest is clear to percussion and auscultation.  There are no rales or rhonchi.  Expansion of the chest is symmetrical.  The precordium is quiet.  The first heart sound is normal.  The second heart sound is physiologically split.  There is no murmur gallop rub or click.  There is no abnormal lift or heave.  The abdomen is soft and nontender.  The bowel sounds are normal.  The liver and spleen are not enlarged.  There are no abdominal masses.  There are no abdominal bruits.  Extremities reveal good pedal pulses.  There is no phlebitis or edema.  There is no cyanosis or clubbing.  Strength is normal and symmetrical in  all extremities.  There is no lateralizing weakness.  There are no sensory deficits.  The skin is warm and dry.  There is no rash.  EKG shows normal sinus rhythm and is within normal limits   Assessment / Plan: 1.  Ischemic heart disease status post CABG in 1991 and status post stenting of saphenous vein graft in 2008 with a bare-metal stent, doing well with no recurrent angina. 2. ascending thoracic aortic aneurysm, last checked in 2009 3. history of prostate cancer treated with retropubic radical prostatectomy. 4. past history of cancer of the tongue treated with radiotherapy and chemotherapy 5. past history of cholelithiasis with history of retroperitoneal abscess following surgery. 6. hypercholesterolemia, off statins since June 2013 7. known small PFO with past history of small stroke 8. concerned about  getting too much irradiation  Plan: We're checking fasting lipid panel hepatic function panel and basal metabolic panel today.  Then we will talk to him about restarting statin therapy. Return for a two-dimensional echocardiogram to look at LV function and aortic root size. Recheck in 6 months for followup office visit and EKG

## 2012-12-15 NOTE — Assessment & Plan Note (Signed)
The patient is not having any chest pain.  He has not had any symptoms related to his thoracic aortic aneurysm.  He does not want to have a CT angiogram of his chest.  We will get an echocardiogram and look at his aortic root.

## 2012-12-15 NOTE — Assessment & Plan Note (Signed)
The patient has not been experiencing any recurrent chest pain or angina.  He remains on a daily aspirin.  He has not been on statin therapy since last June.  He does not recall why it was stopped.  We will update his lipid profile

## 2012-12-18 DIAGNOSIS — IMO0002 Reserved for concepts with insufficient information to code with codable children: Secondary | ICD-10-CM | POA: Diagnosis not present

## 2012-12-18 DIAGNOSIS — M999 Biomechanical lesion, unspecified: Secondary | ICD-10-CM | POA: Diagnosis not present

## 2012-12-19 ENCOUNTER — Telehealth: Payer: Self-pay | Admitting: Cardiology

## 2012-12-19 NOTE — Telephone Encounter (Signed)
New Prob    Pt has some questions regarding his upcoming ECHO. Would like to speak to nurse.

## 2012-12-19 NOTE — Telephone Encounter (Signed)
Spoke with pt, he has questions regarding the [place] diagnostic imaging form he was given when his echo was scheduled. Discussed with charmaine hall in pre-cert. She called and spoke with the pt and answered his questions regarding medicare and coverage.

## 2012-12-30 DIAGNOSIS — M999 Biomechanical lesion, unspecified: Secondary | ICD-10-CM | POA: Diagnosis not present

## 2012-12-30 DIAGNOSIS — IMO0002 Reserved for concepts with insufficient information to code with codable children: Secondary | ICD-10-CM | POA: Diagnosis not present

## 2012-12-31 ENCOUNTER — Telehealth: Payer: Self-pay | Admitting: *Deleted

## 2012-12-31 DIAGNOSIS — E7800 Pure hypercholesterolemia, unspecified: Secondary | ICD-10-CM

## 2012-12-31 MED ORDER — ATORVASTATIN CALCIUM 80 MG PO TABS: 80.0000 mg | ORAL_TABLET | Freq: Every day | ORAL | Status: DC

## 2012-12-31 NOTE — Telephone Encounter (Signed)
Advised patient of lab results  

## 2012-12-31 NOTE — Telephone Encounter (Signed)
Message copied by Burnell Blanks on Wed Dec 31, 2012 12:48 PM ------      Message from: Cassell Clement      Created: Mon Dec 15, 2012  7:19 PM       Please report.  Cholesterol and LDL are very high. LDL 168 (ideal goal for him with CAD is 70s).  Add Lipitor 80 mg daily.  Recheck LP, HFP BMET 2 month ------

## 2013-01-12 ENCOUNTER — Ambulatory Visit (HOSPITAL_COMMUNITY): Payer: Medicare Other | Attending: Cardiology | Admitting: Radiology

## 2013-01-12 DIAGNOSIS — I712 Thoracic aortic aneurysm, without rupture, unspecified: Secondary | ICD-10-CM | POA: Insufficient documentation

## 2013-01-12 DIAGNOSIS — I2510 Atherosclerotic heart disease of native coronary artery without angina pectoris: Secondary | ICD-10-CM

## 2013-01-12 NOTE — Progress Notes (Signed)
Echocardiogram performed.  

## 2013-01-21 ENCOUNTER — Telehealth: Payer: Self-pay | Admitting: *Deleted

## 2013-01-21 NOTE — Telephone Encounter (Signed)
Advised patient

## 2013-01-21 NOTE — Telephone Encounter (Signed)
Message copied by Burnell Blanks on Wed Jan 21, 2013  8:53 AM ------      Message from: Cassell Clement      Created: Wed Jan 14, 2013 11:13 AM       Please report.  The echo is satisfactory.  The aortic root is 4.4 and the ascending aorta is 4.5 which is similar to previous study.  The left ventricular ejection fraction is satisfactory at 50-55%.CSD ------

## 2013-01-22 ENCOUNTER — Ambulatory Visit
Admission: RE | Admit: 2013-01-22 | Discharge: 2013-01-22 | Disposition: A | Discharge status: Home / Self Care | Payer: Medicare Other | Source: Ambulatory Visit | Attending: Radiation Oncology | Admitting: Radiation Oncology

## 2013-01-22 VITALS — BP 147/78 | HR 61 | Temp 98.2°F | Ht 69.0 in | Wt 170.6 lb

## 2013-01-22 DIAGNOSIS — C01 Malignant neoplasm of base of tongue: Secondary | ICD-10-CM

## 2013-01-22 DIAGNOSIS — K117 Disturbances of salivary secretion: Secondary | ICD-10-CM | POA: Insufficient documentation

## 2013-01-22 DIAGNOSIS — Z7982 Long term (current) use of aspirin: Secondary | ICD-10-CM | POA: Insufficient documentation

## 2013-01-22 DIAGNOSIS — C109 Malignant neoplasm of oropharynx, unspecified: Secondary | ICD-10-CM

## 2013-01-22 DIAGNOSIS — Z79899 Other long term (current) drug therapy: Secondary | ICD-10-CM | POA: Insufficient documentation

## 2013-01-22 MED ORDER — LARYNGOSCOPY SOLUTION RAD-ONC
15.0000 mL | Freq: Once | TOPICAL | Status: AC
  Administered 2013-01-22: 15 mL via TOPICAL
  Filled 2013-01-22: qty 15

## 2013-01-22 NOTE — Progress Notes (Signed)
Mr. Aspinall here for follow up after treatment to the base of his tongue.  He denies pain and fatigue.  He is swallowing eating fine.  He does need to make sure his food has some liquid in because he has trouble swallowing foods that are dry.  He states his mouth is dry.  He denies nausea.

## 2013-01-27 ENCOUNTER — Encounter: Payer: Self-pay | Admitting: Radiation Oncology

## 2013-01-27 NOTE — Progress Notes (Signed)
Radiation Oncology         [phone] ________________________________  Name: Jason Noble MRN: [id number]  Date: 01/22/2013  DOB: 1941-02-26  Follow-Up Visit Note  CC: [name], MD  Serena Colonel, MD  Diagnosis:   Squamous cell carcinoma of the base of tongue  Interval Since Last Radiation:  18 months   Narrative:  The patient returns today for routine follow-up.  The patient states that he is doing fairly well today. He indicates that he is swallowing fine and he has recovered and healed very well since his oral surgery. The patient does continue to experience ongoing xerostomia. This appears to be fairly stable in this affects what he eats as he does need to avoid foods that are too dry. He continues Salagen and has been taking this twice a day. No jaw pain or oral pain at this time. The patient does have ongoing hearing changes. He reports difficulty with background noise with an ongoing slight buzzing or, in the background. This causes him some difficulty with listening, especially if he cannot also read lips.  He denies new pain in the head neck region or elsewhere. He appears to have good energy level at this time.                              ALLERGIES:  has No Known Allergies.  Meds: Current Outpatient Prescriptions  Medication Sig Dispense Refill  . aspirin EC 325 MG EC tablet Take 325 mg by mouth daily.        Marland Kitchen atorvastatin (LIPITOR) 80 MG tablet Take 1 tablet (80 mg total) by mouth daily.  90 tablet  3  . calcium carbonate (OS-CAL) 600 MG TABS Take 600 mg by mouth 2 (two) times daily with a meal.      . cholecalciferol (VITAMIN D) 1000 UNITS tablet Take 1,000 Units by mouth daily.      . fish oil-omega-3 fatty acids 1000 MG capsule Take 2 g by mouth daily.      . Multiple Vitamin (MULTIVITAMIN) tablet Take 1 tablet by mouth daily.      . pilocarpine (SALAGEN) 5 MG tablet 5 mg 3 (three) times daily.       . sodium fluoride (FLUORISHIELD) 1.1 % GEL dental gel  Place 1 application onto teeth at bedtime. Either this or prevident      . Sodium Fluoride 1.1 % PSTE Apply thin ribbon of cream to toothbrush. Brush teeth for 2 minutes at bedtime. Spit out excess. Do not swallow. Either this or fluorishield      . tretinoin (RETIN-A) 0.025 % cream As Directed      . UBIQUINOL PO Take 1 tablet by mouth daily.      . vitamin C (ASCORBIC ACID) 500 MG tablet Take 500 mg by mouth daily.      . vitamin E 400 UNIT capsule Take 400 Units by mouth daily.       No current facility-administered medications for this encounter.   Facility-Administered Medications Ordered in Other Encounters  Medication Dose Route Frequency Provider Last Rate Last Dose  . topical emolient (BIAFINE) emulsion   Topical BID Jonna Coup, MD        Physical Findings: The patient is in no acute distress. Patient is alert and oriented.  height is 5\' 9"  (1.753 m) and weight is 170 lb 9.6 oz (77.384 kg). His temperature is 98.2 F (36.8 C). His  blood pressure is 147/78 and his pulse is 61. .   General: Well-developed, in no acute distress, the patient appears with a healthy weight today. HEENT: Normocephalic, atraumatic, no intraoral lesions or suspicious findings Neck: No cervical lymphadenopathy, mild radiation change Cardiovascular: Regular rate and rhythm Respiratory: Clear to auscultation bilaterally GI: Soft, nontender, normal bowel sounds Extremities: No edema present  Fiberoptic exam: After the use of topical anesthetic, the flexible laryngoscope was passed through the left naris. Good visualization was obtained. No lesions or suspicious findings within the larynx, hypopharynx, oropharynx or nasopharynx. The vocal cords moved symmetrically bilaterally.    Lab Findings: Lab Results  Component Value Date   WBC 5.0 10/22/2012   HGB 14.0 10/22/2012   HCT 40.9 10/22/2012   MCV 92.3 10/22/2012   PLT 182 10/22/2012     Radiographic Findings: No results found.  Impression:    The  patient remains clinically NED at this time. Continued sequelae from radiotherapy with no new symptoms today.  Plan:  Ongoing followup in 6 months.   I spent 15 minutes with the patient today, the majority of which was spent counseling the patient on the diagnosis of cancer and coordinating care.  Radene Gunning, M.D., Ph.D.

## 2013-01-28 DIAGNOSIS — M999 Biomechanical lesion, unspecified: Secondary | ICD-10-CM | POA: Diagnosis not present

## 2013-01-28 DIAGNOSIS — IMO0002 Reserved for concepts with insufficient information to code with codable children: Secondary | ICD-10-CM | POA: Diagnosis not present

## 2013-02-11 ENCOUNTER — Telehealth: Payer: Self-pay | Admitting: Oncology

## 2013-02-11 NOTE — Telephone Encounter (Signed)
Received a call from [employer] asking for a letter from Dr. Mitzi Hansen about his hearing loss.  Notified Dr. Mitzi Hansen, who had already dictated a letter in Epic.  Printed the letter and sent it to Mr. Kannan.  Called him back and let him know it should arrive in a few days in the mail.

## 2013-03-03 ENCOUNTER — Other Ambulatory Visit: Payer: Self-pay

## 2013-03-10 ENCOUNTER — Other Ambulatory Visit (INDEPENDENT_AMBULATORY_CARE_PROVIDER_SITE_OTHER): Payer: Medicare Other

## 2013-03-10 DIAGNOSIS — E7800 Pure hypercholesterolemia, unspecified: Secondary | ICD-10-CM | POA: Diagnosis not present

## 2013-03-10 LAB — HEPATIC FUNCTION PANEL
ALT: 15 U/L (ref 0–53)
AST: 18 U/L (ref 0–37)
Albumin: 3.6 g/dL (ref 3.5–5.2)
Alkaline Phosphatase: 61 U/L (ref 39–117)
Bilirubin, Direct: 0.1 mg/dL (ref 0.0–0.3)
Total Bilirubin: 1.1 mg/dL (ref 0.3–1.2)
Total Protein: 6.7 g/dL (ref 6.0–8.3)

## 2013-03-10 LAB — LIPID PANEL
Cholesterol: 145 mg/dL (ref 0–200)
HDL: 59.6 mg/dL (ref >39.00)
LDL Cholesterol: 72 mg/dL (ref 0–99)
Total CHOL/HDL Ratio: 2
Triglycerides: 67 mg/dL (ref 0.0–149.0)
VLDL: 13.4 mg/dL (ref 0.0–40.0)

## 2013-03-10 LAB — BASIC METABOLIC PANEL
BUN: 16 mg/dL (ref 6–23)
CO2: 25 mEq/L (ref 19–32)
Calcium: 9.2 mg/dL (ref 8.4–10.5)
Chloride: 108 mEq/L (ref 96–112)
Creatinine, Ser: 1 mg/dL (ref 0.4–1.5)
GFR: 80.86 mL/min (ref >60.00)
Glucose, Bld: 98 mg/dL (ref 70–99)
Potassium: 4.2 mEq/L (ref 3.5–5.1)
Sodium: 141 mEq/L (ref 135–145)

## 2013-03-10 NOTE — Progress Notes (Signed)
Quick Note:  Please report to patient. The recent labs are stable. Continue same medication and careful diet. Cholesterol much better. ______ 

## 2013-03-12 ENCOUNTER — Telehealth: Payer: Self-pay | Admitting: *Deleted

## 2013-03-12 NOTE — Telephone Encounter (Signed)
Advised patient of lab results  

## 2013-03-12 NOTE — Telephone Encounter (Signed)
Message copied by Burnell Blanks on Thu Mar 12, 2013  8:53 AM ------      Message from: Cassell Clement      Created: Tue Mar 10, 2013  3:56 PM       Please report to patient.  The recent labs are stable. Continue same medication and careful diet. Cholesterol much better. ------

## 2013-04-22 ENCOUNTER — Ambulatory Visit (HOSPITAL_BASED_OUTPATIENT_CLINIC_OR_DEPARTMENT_OTHER): Payer: Medicare Other | Admitting: Hematology and Oncology

## 2013-04-22 ENCOUNTER — Other Ambulatory Visit (HOSPITAL_BASED_OUTPATIENT_CLINIC_OR_DEPARTMENT_OTHER): Payer: Medicare Other

## 2013-04-22 ENCOUNTER — Other Ambulatory Visit: Payer: Self-pay

## 2013-04-22 VITALS — BP 152/88 | HR 59 | Temp 97.6°F | Resp 20 | Ht 69.0 in | Wt 173.1 lb

## 2013-04-22 DIAGNOSIS — Z87891 Personal history of nicotine dependence: Secondary | ICD-10-CM | POA: Diagnosis not present

## 2013-04-22 DIAGNOSIS — B977 Papillomavirus as the cause of diseases classified elsewhere: Secondary | ICD-10-CM | POA: Diagnosis not present

## 2013-04-22 DIAGNOSIS — R5381 Other malaise: Secondary | ICD-10-CM | POA: Diagnosis not present

## 2013-04-22 DIAGNOSIS — C109 Malignant neoplasm of oropharynx, unspecified: Secondary | ICD-10-CM

## 2013-04-22 DIAGNOSIS — Z923 Personal history of irradiation: Secondary | ICD-10-CM | POA: Diagnosis not present

## 2013-04-22 DIAGNOSIS — K117 Disturbances of salivary secretion: Secondary | ICD-10-CM | POA: Diagnosis not present

## 2013-04-22 DIAGNOSIS — R5383 Other fatigue: Secondary | ICD-10-CM

## 2013-04-22 LAB — CBC WITH DIFFERENTIAL/PLATELET
BASO%: 0.3 % (ref 0.0–2.0)
Basophils Absolute: 0 10*3/uL (ref 0.0–0.1)
EOS%: 6.7 % (ref 0.0–7.0)
Eosinophils Absolute: 0.3 10*3/uL (ref 0.0–0.5)
HCT: 41.3 % (ref 38.4–49.9)
HGB: 13.7 g/dL (ref 13.0–17.1)
LYMPH%: 20.5 % (ref 14.0–49.0)
MCH: 30.3 pg (ref 27.2–33.4)
MCHC: 33.3 g/dL (ref 32.0–36.0)
MCV: 91.1 fL (ref 79.3–98.0)
MONO#: 0.4 10*3/uL (ref 0.1–0.9)
MONO%: 9.4 % (ref 0.0–14.0)
NEUT#: 2.8 10*3/uL (ref 1.5–6.5)
NEUT%: 63.1 % (ref 39.0–75.0)
Platelets: 159 10*3/uL (ref 140–400)
RBC: 4.53 10*6/uL (ref 4.20–5.82)
RDW: 14.2 % (ref 11.0–14.6)
WBC: 4.4 10*3/uL (ref 4.0–10.3)
lymph#: 0.9 10*3/uL (ref 0.9–3.3)

## 2013-04-22 LAB — COMPREHENSIVE METABOLIC PANEL (CC13)
ALT: 13 U/L (ref 0–55)
AST: 16 U/L (ref 5–34)
Albumin: 3.4 g/dL — ABNORMAL LOW (ref 3.5–5.0)
Alkaline Phosphatase: 69 U/L (ref 40–150)
BUN: 20.4 mg/dL (ref 7.0–26.0)
CO2: 23 mEq/L (ref 22–29)
Calcium: 9.3 mg/dL (ref 8.4–10.4)
Chloride: 110 mEq/L — ABNORMAL HIGH (ref 98–109)
Creatinine: 1 mg/dL (ref 0.7–1.3)
Glucose: 87 mg/dl (ref 70–140)
Potassium: 4 mEq/L (ref 3.5–5.1)
Sodium: 141 mEq/L (ref 136–145)
Total Bilirubin: 0.67 mg/dL (ref 0.20–1.20)
Total Protein: 6.6 g/dL (ref 6.4–8.3)

## 2013-04-22 LAB — TSH CHCC: TSH: 1.773 m(IU)/L (ref 0.320–4.118)

## 2013-04-23 ENCOUNTER — Telehealth: Payer: Self-pay | Admitting: Hematology and Oncology

## 2013-04-23 NOTE — Telephone Encounter (Signed)
, °

## 2013-05-04 ENCOUNTER — Other Ambulatory Visit: Payer: Self-pay | Admitting: Dermatology

## 2013-05-04 DIAGNOSIS — D485 Neoplasm of uncertain behavior of skin: Secondary | ICD-10-CM | POA: Diagnosis not present

## 2013-05-04 DIAGNOSIS — L570 Actinic keratosis: Secondary | ICD-10-CM | POA: Diagnosis not present

## 2013-05-04 DIAGNOSIS — D045 Carcinoma in situ of skin of trunk: Secondary | ICD-10-CM | POA: Diagnosis not present

## 2013-05-04 DIAGNOSIS — Z85828 Personal history of other malignant neoplasm of skin: Secondary | ICD-10-CM | POA: Diagnosis not present

## 2013-05-04 DIAGNOSIS — C44611 Basal cell carcinoma of skin of unspecified upper limb, including shoulder: Secondary | ICD-10-CM | POA: Diagnosis not present

## 2013-05-04 DIAGNOSIS — D0460 Carcinoma in situ of skin of unspecified upper limb, including shoulder: Secondary | ICD-10-CM | POA: Diagnosis not present

## 2013-05-04 DIAGNOSIS — L708 Other acne: Secondary | ICD-10-CM | POA: Diagnosis not present

## 2013-05-04 DIAGNOSIS — C44319 Basal cell carcinoma of skin of other parts of face: Secondary | ICD-10-CM | POA: Diagnosis not present

## 2013-05-04 DIAGNOSIS — L719 Rosacea, unspecified: Secondary | ICD-10-CM | POA: Diagnosis not present

## 2013-05-11 NOTE — Progress Notes (Signed)
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   PAST THERAPY: concurrent chemoradiation therapy with weekly cisplatin and daily radiation therapy between June 18, 2011 and August 03, 2011.   CURRENT THERAPY: watchful observation   INTERVAL HISTORY:  Eual Lindstrom Uriarte 72 y.o. male returns for regular follow up visit. His appetite is as good as it was before. He gained weight. His mouth is dry and chewing gum is helpful. He needs to eat with water. Patient reported diminished hearing after treatment and has much low voice than before. He needs hearing aid and I recommended to take letter from his END physician who check his hearing recently.. The patient denied fever, chills, night sweats.He denied headaches, double vision, blurry vision, nasal congestion, nasal discharge, hearing problems, odynophagia. No chest pain, palpitations, dyspnea, cough, abdominal pain, nausea, vomiting, diarrhea, constipation, hematochezia. The patient denied dysuria, nocturia, polyuria, hematuria, myalgia, numbness, tingling, psychiatric problems.  PAST MEDICAL HISTORY: Past Medical History  Diagnosis Date  . Cholecystitis   . Abscess of gallbladder   . CVA (cerebral infarction) 2007  . 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 Skin cancer     basal cell, squamous cell  . Prostate cancer 2002  . Basal cell carcinoma of skin   . Squamous cell carcinoma of skin   . Myocardial infarction     approx. 2--8  . Coronary heart disease     has a stent  . History of radiation therapy 06/18/2011- 08/03/2011    squamous cell tongue/69.96 Gy 73fx  . Fatigue 07/26/2011    PAST SURGICAL HISTORY: Past Surgical History  Procedure Laterality Date   . Prostatectomy    . Peg tube placement  06/13/11 at [facility]  . Coronary artery bypass graft  1991  . Cholecystectomy  [id number]    3 surgeries involved, laparotomy  . Tonsillectomy    . Wrist surgery  approx. 39 yrs ago    removal left radial head  . Multiple tooth extractions      prior to radiation    FAMILY HISTORY Family History  Problem Relation Age of Onset  . Stroke Mother   . Coronary artery disease Brother     had bypass   HEALTH MAINTENANCE: History  Substance Use Topics  . Smoking status: Former Smoker    Quit date: 09/17/1965  . Smokeless tobacco: Never Used  . Alcohol Use: No     Comment: rare   No Known Allergies  Current Outpatient Prescriptions  Medication Sig Dispense Refill  . aspirin EC 325 MG EC tablet Take 325 mg by mouth daily.        Marland Kitchen atorvastatin (LIPITOR) 80 MG tablet Take 1 tablet (80 mg total) by mouth daily.  90 tablet  3  . calcium carbonate (OS-CAL) 600 MG TABS Take 600 mg by mouth 2 (two) times daily with a meal.      . cholecalciferol (VITAMIN D) 1000 UNITS tablet Take 1,000 Units by mouth daily.      . fish oil-omega-3 fatty acids 1000 MG capsule Take 2 g by mouth daily.      . Multiple Vitamin (MULTIVITAMIN) tablet Take 1 tablet by mouth daily.      . pilocarpine (SALAGEN) 5  MG tablet 5 mg 3 (three) times daily.       . sodium fluoride (FLUORISHIELD) 1.1 % GEL dental gel Place 1 application onto teeth at bedtime. Either this or prevident      . Sodium Fluoride 1.1 % PSTE Apply thin ribbon of cream to toothbrush. Brush teeth for 2 minutes at bedtime. Spit out excess. Do not swallow. Either this or fluorishield      . tretinoin (RETIN-A) 0.025 % cream As Directed      . UBIQUINOL PO Take 1 tablet by mouth daily.      . vitamin C (ASCORBIC ACID) 500 MG tablet Take 500 mg by mouth daily.      . vitamin E 400 UNIT capsule Take 400 Units by mouth daily.       No current facility-administered medications for this visit.    Facility-Administered Medications Ordered in Other Visits  Medication Dose Route Frequency Provider Last Rate Last Dose  . topical emolient (BIAFINE) emulsion   Topical BID Jonna Coup, MD        OBJECTIVE: Filed Vitals:   04/22/13 1018  BP: 152/88  Pulse: 59  Temp: 97.6 F (36.4 C)  Resp: 20     Body mass index is 25.55 kg/(m^2).    ECOG FS:0  PHYSICAL EXAMINATION:  HEENT: Sclerae anicteric.  Conjunctivae were pink. Pupils round and reactive bilaterally. Oral mucosa is moist without ulceration or thrush. No occipital, submandibular, cervical, supraclavicular or axillar adenopathy. Hearing is diminished. Lungs: clear to auscultation without wheezes. No rales or rhonchi. Heart: regular rate and rhythm. No murmur, gallop or rubs. Abdomen: soft, non tender. No guarding or rebound tenderness. Bowel sounds are present. No palpable hepatosplenomegaly. MSK: no focal spinal tenderness. Extremities: No clubbing or cyanosis.No calf tenderness to palpitation, no peripheral edema. The patient had grossly intact strength in upper and lower extremities. Skin exam was without ecchymosis, petechiae. Neuro: non-focal, alert and oriented to time, person and place, appropriate affect  LAB RESULTS:  CMP     Component Value Date/Time   NA 141 04/22/2013 1001   NA 141 03/10/2013 0910   K 4.0 04/22/2013 1001   K 4.2 03/10/2013 0910   CL 108 03/10/2013 0910   CL 106 10/22/2012 0829   CO2 23 04/22/2013 1001   CO2 25 03/10/2013 0910   GLUCOSE 87 04/22/2013 1001   GLUCOSE 98 03/10/2013 0910   GLUCOSE 101* 10/22/2012 0829   BUN 20.4 04/22/2013 1001   BUN 16 03/10/2013 0910   CREATININE 1.0 04/22/2013 1001   CREATININE 1.0 03/10/2013 0910   CALCIUM 9.3 04/22/2013 1001   CALCIUM 9.2 03/10/2013 0910   PROT 6.6 04/22/2013 1001   PROT 6.7 03/10/2013 0910   ALBUMIN 3.4* 04/22/2013 1001   ALBUMIN 3.6 03/10/2013 0910   AST 16 04/22/2013 1001   AST 18 03/10/2013 0910   ALT 13 04/22/2013 1001   ALT 15 03/10/2013 0910   ALKPHOS  69 04/22/2013 1001   ALKPHOS 61 03/10/2013 0910   BILITOT 0.67 04/22/2013 1001   BILITOT 1.1 03/10/2013 0910   GFRNONAA 85* 07/30/2011 0506   GFRAA >90 07/30/2011 0506    I No results found for this basename: SPEP, UPEP,  kappa and lambda light chains    Lab Results  Component Value Date   WBC 4.4 04/22/2013   NEUTROABS 2.8 04/22/2013   HGB 13.7 04/22/2013   HCT 41.3 04/22/2013   MCV 91.1 04/22/2013   PLT 159 04/22/2013  Chemistry      Component Value Date/Time   NA 141 04/22/2013 1001   NA 141 03/10/2013 0910   K 4.0 04/22/2013 1001   K 4.2 03/10/2013 0910   CL 108 03/10/2013 0910   CL 106 10/22/2012 0829   CO2 23 04/22/2013 1001   CO2 25 03/10/2013 0910   BUN 20.4 04/22/2013 1001   BUN 16 03/10/2013 0910   CREATININE 1.0 04/22/2013 1001   CREATININE 1.0 03/10/2013 0910      Component Value Date/Time   CALCIUM 9.3 04/22/2013 1001   CALCIUM 9.2 03/10/2013 0910   ALKPHOS 69 04/22/2013 1001   ALKPHOS 61 03/10/2013 0910   AST 16 04/22/2013 1001   AST 18 03/10/2013 0910   ALT 13 04/22/2013 1001   ALT 15 03/10/2013 0910   BILITOT 0.67 04/22/2013 1001   BILITOT 1.1 03/10/2013 0910     TSH 1.773 STUDIES: No results found.  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  No evidence of disease. I recommended follow up with Rad Onc and ENT where laryngoscopy can be performed for routine surveillance.  I will see him in about 6 months.  2. 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  3. History of smoking for 5 years and quit in 1967. He is not smoking now. I advised him to refrain from smoking, chewing tobacco and heavy alcohol consumption to decrease the risk of recurrent cancer.  4. Hearing loss: Patient previously received Cisplatin which can cause ototoxicity. 5. History of coronary artery disease. Status post bypass long time ago and one episode of in-graft stenosis with stent placement. Since 2008, he has been stable, no CHF. He is  on aspirin. With his weight loss, he had self d/c his statin. Remains on fish oil. I defer to his cardiologist for management.  6. History of prostate cancer in 2002. He is under care of his urologist and is reportedly in NED.  7. Follow up: With in  6 months.    Myra Rude, MD   04/22/2013 6:48 PM

## 2013-06-19 DIAGNOSIS — Z23 Encounter for immunization: Secondary | ICD-10-CM | POA: Diagnosis not present

## 2013-06-23 DIAGNOSIS — C44611 Basal cell carcinoma of skin of unspecified upper limb, including shoulder: Secondary | ICD-10-CM | POA: Diagnosis not present

## 2013-06-23 DIAGNOSIS — D045 Carcinoma in situ of skin of trunk: Secondary | ICD-10-CM | POA: Diagnosis not present

## 2013-07-23 ENCOUNTER — Other Ambulatory Visit: Payer: Self-pay

## 2013-07-23 DIAGNOSIS — M8448XA Pathological fracture, other site, initial encounter for fracture: Secondary | ICD-10-CM | POA: Diagnosis not present

## 2013-07-30 ENCOUNTER — Ambulatory Visit: Payer: Medicare Other | Admitting: Radiation Oncology

## 2013-08-06 ENCOUNTER — Ambulatory Visit: Payer: Medicare Other | Admitting: Radiation Oncology

## 2013-08-12 ENCOUNTER — Other Ambulatory Visit: Payer: Self-pay | Admitting: Dermatology

## 2013-08-12 DIAGNOSIS — L719 Rosacea, unspecified: Secondary | ICD-10-CM | POA: Diagnosis not present

## 2013-08-12 DIAGNOSIS — Z85828 Personal history of other malignant neoplasm of skin: Secondary | ICD-10-CM | POA: Diagnosis not present

## 2013-08-12 DIAGNOSIS — D485 Neoplasm of uncertain behavior of skin: Secondary | ICD-10-CM | POA: Diagnosis not present

## 2013-08-12 DIAGNOSIS — C44611 Basal cell carcinoma of skin of unspecified upper limb, including shoulder: Secondary | ICD-10-CM | POA: Diagnosis not present

## 2013-08-12 DIAGNOSIS — C44519 Basal cell carcinoma of skin of other part of trunk: Secondary | ICD-10-CM | POA: Diagnosis not present

## 2013-08-12 DIAGNOSIS — D0460 Carcinoma in situ of skin of unspecified upper limb, including shoulder: Secondary | ICD-10-CM | POA: Diagnosis not present

## 2013-08-12 DIAGNOSIS — L570 Actinic keratosis: Secondary | ICD-10-CM | POA: Diagnosis not present

## 2013-08-17 DIAGNOSIS — C44319 Basal cell carcinoma of skin of other parts of face: Secondary | ICD-10-CM | POA: Diagnosis not present

## 2013-08-20 ENCOUNTER — Ambulatory Visit: Admission: RE | Admit: 2013-08-20 | Payer: Medicare Other | Source: Ambulatory Visit | Admitting: Radiation Oncology

## 2013-08-21 ENCOUNTER — Telehealth: Payer: Self-pay | Admitting: Nutrition

## 2013-08-21 NOTE — Telephone Encounter (Signed)
Returned phone call to patient.  He wanted to know of a supplement to "build healthy cells".  He was unclear of the specifics.  I educated him about Unjury Whey Protein Powder.  Patient will look into this and call me with further questions.

## 2013-08-25 DIAGNOSIS — L259 Unspecified contact dermatitis, unspecified cause: Secondary | ICD-10-CM | POA: Diagnosis not present

## 2013-08-25 DIAGNOSIS — C44519 Basal cell carcinoma of skin of other part of trunk: Secondary | ICD-10-CM | POA: Diagnosis not present

## 2013-08-26 ENCOUNTER — Ambulatory Visit
Admission: RE | Admit: 2013-08-26 | Discharge: 2013-08-26 | Disposition: A | Discharge status: Home / Self Care | Payer: Medicare Other | Source: Ambulatory Visit | Attending: Radiation Oncology | Admitting: Radiation Oncology

## 2013-08-26 ENCOUNTER — Encounter: Payer: Self-pay | Admitting: Radiation Oncology

## 2013-08-26 VITALS — BP 148/80 | HR 63 | Temp 97.9°F | Resp 20 | Ht 69.0 in | Wt 181.0 lb

## 2013-08-26 DIAGNOSIS — J387 Other diseases of larynx: Secondary | ICD-10-CM | POA: Insufficient documentation

## 2013-08-26 DIAGNOSIS — C01 Malignant neoplasm of base of tongue: Secondary | ICD-10-CM | POA: Diagnosis not present

## 2013-08-26 DIAGNOSIS — K117 Disturbances of salivary secretion: Secondary | ICD-10-CM | POA: Insufficient documentation

## 2013-08-26 DIAGNOSIS — H9190 Unspecified hearing loss, unspecified ear: Secondary | ICD-10-CM | POA: Insufficient documentation

## 2013-08-26 MED ORDER — LARYNGOSCOPY SOLUTION RAD-ONC
15.0000 mL | Freq: Once | TOPICAL | Status: AC
  Administered 2013-08-26: 15 mL via TOPICAL
  Filled 2013-08-26: qty 15

## 2013-08-26 NOTE — Progress Notes (Signed)
Follow up tongue radiation  06/18/11-08/03/11  No c/o pain, froggy voice, taste some there, not normal stated, loss hearing and vision changes, scar on right side of face, skin cell removed, and skin cancer removed on back 08/12/13, no difficulty swallowing , food does stick to inside of mouth little saliva 2:13 PM

## 2013-08-26 NOTE — Progress Notes (Signed)
Radiation Oncology         [phone] ________________________________  Name: Jason Noble MRN: [id number]  Date: 08/26/2013  DOB: 04/03/1941  Follow-Up Visit Note  CC: [name], MD  Serena Colonel, MD  Diagnosis:   Squamous cell carcinoma of the base of tongue  Interval Since Last Radiation:  2 years   Narrative:  The patient returns today for routine follow-up.  The patient is seen today for routine followup. He continues to complain of some long-term issues including hearing loss, change in taste, and xerostomia. He no longer is taking Salagen and does not feel that this may the major difference for him. He he is to require carrying a water bottle around but has not been doing this recently. The patient denies any difficulty swallowing. No odynophagia. He notes that his voice has changed but she notices singing but the voice is fairly strong.                              ALLERGIES:  has No Known Allergies.  Meds: Current Outpatient Prescriptions  Medication Sig Dispense Refill  . aspirin EC 325 MG EC tablet Take 325 mg by mouth daily.        Marland Kitchen atorvastatin (LIPITOR) 80 MG tablet Take 1 tablet (80 mg total) by mouth daily.  90 tablet  3  . calcium carbonate (OS-CAL) 600 MG TABS Take 600 mg by mouth 2 (two) times daily with a meal.      . cholecalciferol (VITAMIN D) 1000 UNITS tablet Take 1,000 Units by mouth daily.      . clobetasol cream (TEMOVATE) 0.05 % Apply 1 application topically 2 (two) times daily.       . fish oil-omega-3 fatty acids 1000 MG capsule Take 2 g by mouth daily.      . Multiple Vitamin (MULTIVITAMIN) tablet Take 1 tablet by mouth daily.      . Sodium Fluoride 1.1 % PSTE Apply thin ribbon of cream to toothbrush. Brush teeth for 2 minutes at bedtime. Spit out excess. Do not swallow. Either this or fluorishield      . tretinoin (RETIN-A) 0.025 % cream As Directed      . UBIQUINOL PO Take 1 tablet by mouth daily.      . vitamin C (ASCORBIC ACID) 500 MG  tablet Take 500 mg by mouth daily.      . vitamin E 400 UNIT capsule Take 400 Units by mouth daily.      . pilocarpine (SALAGEN) 5 MG tablet 5 mg 3 (three) times daily.        No current facility-administered medications for this encounter.   Facility-Administered Medications Ordered in Other Encounters  Medication Dose Route Frequency Provider Last Rate Last Dose  . topical emolient (BIAFINE) emulsion   Topical BID Jonna Coup, MD        Physical Findings: The patient is in no acute distress. Patient is alert and oriented.  height is 5\' 9"  (1.753 m) and weight is 181 lb (82.101 kg). His oral temperature is 97.9 F (36.6 C). His blood pressure is 148/80 and his pulse is 63. His respiration is 20 and oxygen saturation is 100%. .   General: Well-developed, in no acute distress HEENT: Normocephalic, atraumatic; healing incision along the right cheek status post removal of a skin cancer. Oral cavity clear. Decreased saliva present. Cardiovascular: Regular rate and rhythm Respiratory: Clear to auscultation bilaterally GI:  Soft, nontender, normal bowel sounds Extremities: No edema present Fiberoptic exam: After the use of topical anesthetic, the flexible laryngoscope was passed through the right near. Good visualization was obtained. No lesions or suspicious findings within the larynx, hypopharynx, oropharynx or nasopharynx. Fibrosis of the epiglottis is present.   Lab Findings: Lab Results  Component Value Date   WBC 4.4 04/22/2013   HGB 13.7 04/22/2013   HCT 41.3 04/22/2013   MCV 91.1 04/22/2013   PLT 159 04/22/2013     Radiographic Findings: No results found.  Impression:    The patient remains clinically NED. Good visualization of the anatomy on fiberoptic exam today. No suspicious findings. Continued ongoing complaints related to treatment without any major changes in this.  Plan:  The patient will followup in our clinic in 6 months for routine followup.  I spent 30 minutes with  the patient today, the majority of which was spent counseling the patient on the diagnosis of cancer and coordinating care.   Radene Gunning, M.D., Ph.D.

## 2013-08-31 DIAGNOSIS — C44611 Basal cell carcinoma of skin of unspecified upper limb, including shoulder: Secondary | ICD-10-CM | POA: Diagnosis not present

## 2013-08-31 DIAGNOSIS — D0460 Carcinoma in situ of skin of unspecified upper limb, including shoulder: Secondary | ICD-10-CM | POA: Diagnosis not present

## 2013-08-31 DIAGNOSIS — D045 Carcinoma in situ of skin of trunk: Secondary | ICD-10-CM | POA: Diagnosis not present

## 2013-08-31 DIAGNOSIS — C44519 Basal cell carcinoma of skin of other part of trunk: Secondary | ICD-10-CM | POA: Diagnosis not present

## 2013-10-07 ENCOUNTER — Other Ambulatory Visit: Payer: Self-pay

## 2013-10-07 DIAGNOSIS — E7800 Pure hypercholesterolemia, unspecified: Secondary | ICD-10-CM

## 2013-10-07 MED ORDER — ATORVASTATIN CALCIUM 80 MG PO TABS: 80.0000 mg | ORAL_TABLET | Freq: Every day | ORAL | Status: DC

## 2013-10-16 ENCOUNTER — Telehealth: Payer: Self-pay | Admitting: *Deleted

## 2013-10-16 NOTE — Telephone Encounter (Signed)
Lm informed the pt that NIG would like for him to come in a little later on 10/28/13. gv appt for 10/28/13 w/labs@ 1:30p and ov@ 2pm. Made pt aware that i will mail a letter/avs...td

## 2013-10-23 DIAGNOSIS — D485 Neoplasm of uncertain behavior of skin: Secondary | ICD-10-CM | POA: Diagnosis not present

## 2013-10-23 DIAGNOSIS — L708 Other acne: Secondary | ICD-10-CM | POA: Diagnosis not present

## 2013-10-23 DIAGNOSIS — L259 Unspecified contact dermatitis, unspecified cause: Secondary | ICD-10-CM | POA: Diagnosis not present

## 2013-10-23 DIAGNOSIS — Z85828 Personal history of other malignant neoplasm of skin: Secondary | ICD-10-CM | POA: Diagnosis not present

## 2013-10-23 DIAGNOSIS — L570 Actinic keratosis: Secondary | ICD-10-CM | POA: Diagnosis not present

## 2013-10-26 DIAGNOSIS — L821 Other seborrheic keratosis: Secondary | ICD-10-CM | POA: Diagnosis not present

## 2013-10-27 ENCOUNTER — Other Ambulatory Visit: Payer: Self-pay | Admitting: Hematology and Oncology

## 2013-10-27 DIAGNOSIS — C01 Malignant neoplasm of base of tongue: Secondary | ICD-10-CM

## 2013-10-27 DIAGNOSIS — R5383 Other fatigue: Secondary | ICD-10-CM

## 2013-10-28 ENCOUNTER — Other Ambulatory Visit (HOSPITAL_BASED_OUTPATIENT_CLINIC_OR_DEPARTMENT_OTHER): Payer: Medicare Other

## 2013-10-28 ENCOUNTER — Ambulatory Visit (HOSPITAL_BASED_OUTPATIENT_CLINIC_OR_DEPARTMENT_OTHER): Payer: Medicare Other | Admitting: Hematology and Oncology

## 2013-10-28 ENCOUNTER — Telehealth: Payer: Self-pay | Admitting: Hematology and Oncology

## 2013-10-28 VITALS — BP 149/79 | HR 63 | Temp 98.0°F | Resp 20 | Ht 69.0 in | Wt 180.1 lb

## 2013-10-28 DIAGNOSIS — C01 Malignant neoplasm of base of tongue: Secondary | ICD-10-CM

## 2013-10-28 DIAGNOSIS — R5383 Other fatigue: Secondary | ICD-10-CM

## 2013-10-28 DIAGNOSIS — M8708 Idiopathic aseptic necrosis of bone, other site: Secondary | ICD-10-CM

## 2013-10-28 DIAGNOSIS — I10 Essential (primary) hypertension: Secondary | ICD-10-CM

## 2013-10-28 DIAGNOSIS — K117 Disturbances of salivary secretion: Secondary | ICD-10-CM

## 2013-10-28 DIAGNOSIS — Z8546 Personal history of malignant neoplasm of prostate: Secondary | ICD-10-CM | POA: Diagnosis not present

## 2013-10-28 DIAGNOSIS — R5381 Other malaise: Secondary | ICD-10-CM | POA: Diagnosis not present

## 2013-10-28 LAB — CBC WITH DIFFERENTIAL/PLATELET
BASO%: 0.5 % (ref 0.0–2.0)
Basophils Absolute: 0 10*3/uL (ref 0.0–0.1)
EOS%: 5.4 % (ref 0.0–7.0)
Eosinophils Absolute: 0.2 10*3/uL (ref 0.0–0.5)
HCT: 41.6 % (ref 38.4–49.9)
HGB: 13.8 g/dL (ref 13.0–17.1)
LYMPH%: 17.4 % (ref 14.0–49.0)
MCH: 30.6 pg (ref 27.2–33.4)
MCHC: 33.3 g/dL (ref 32.0–36.0)
MCV: 92 fL (ref 79.3–98.0)
MONO#: 0.3 10*3/uL (ref 0.1–0.9)
MONO%: 7.9 % (ref 0.0–14.0)
NEUT#: 2.8 10*3/uL (ref 1.5–6.5)
NEUT%: 68.8 % (ref 39.0–75.0)
Platelets: 160 10*3/uL (ref 140–400)
RBC: 4.52 10*6/uL (ref 4.20–5.82)
RDW: 13.6 % (ref 11.0–14.6)
WBC: 4.1 10*3/uL (ref 4.0–10.3)
lymph#: 0.7 10*3/uL — ABNORMAL LOW (ref 0.9–3.3)

## 2013-10-28 LAB — COMPREHENSIVE METABOLIC PANEL (CC13)
ALT: 17 U/L (ref 0–55)
AST: 19 U/L (ref 5–34)
Albumin: 3.5 g/dL (ref 3.5–5.0)
Alkaline Phosphatase: 71 U/L (ref 40–150)
Anion Gap: 7 mEq/L (ref 3–11)
BUN: 21.6 mg/dL (ref 7.0–26.0)
CO2: 25 mEq/L (ref 22–29)
Calcium: 9.3 mg/dL (ref 8.4–10.4)
Chloride: 108 mEq/L (ref 98–109)
Creatinine: 1.1 mg/dL (ref 0.7–1.3)
Glucose: 144 mg/dl — ABNORMAL HIGH (ref 70–140)
Potassium: 4.4 mEq/L (ref 3.5–5.1)
Sodium: 140 mEq/L (ref 136–145)
Total Bilirubin: 0.61 mg/dL (ref 0.20–1.20)
Total Protein: 6.3 g/dL — ABNORMAL LOW (ref 6.4–8.3)

## 2013-10-28 LAB — T4, FREE: Free T4: 1 ng/dL (ref 0.80–1.80)

## 2013-10-28 LAB — TSH CHCC: TSH: 2.139 m(IU)/L (ref 0.320–4.118)

## 2013-10-28 NOTE — Progress Notes (Signed)
[place] OFFICE PROGRESS NOTE  Patient Care Team: Shirline Frees, MD as PCP - General (Family Medicine) Hillary Bow, MD (Cardiology) Izora Gala, MD as Attending Physician Marye Round, MD (Radiation Oncology) Verlon Setting, MD (Dermatology) , DIAGNOSIS: Squamous cell carcinoma of the right base of the tongue T1, N2, M0 HPV positive  SUMMARY OF ONCOLOGIC HISTORY: This is a pleasant 17 your gentleman was diagnosed with invasive squamous cell carcinoma of the tongue in 2012. According to the patient, it started when he palpated a lump on the right side of his neck. He has extensive evaluation including biopsy, PET/CT scan and surgical evaluation. The patient subsequently was treated with concurrent chemotherapy with weekly cisplatin and radiation therapy between 06/18/2011 to 08/03/2011. According to the patient, he had lost 40 pounds of weight during treatment but subsequently came back 25 pounds of weight. 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 He still had persistent dry mouth and altered taste sensation.  INTERVAL HISTORY: Jason Noble 73 y.o. male returns for further followup. He denies any dysphagia. Denies any pain in his mouth. He has discontinue Salagen as he did not find it helpful. He continue aggressive oral care and see his dentist every 3 months. He denies any new palpable lymphadenopathy. After chemotherapy, he has significant hearing deficits and was told that he have hearing problems requiring hearing aids.  I have reviewed the past medical history, past surgical history, social history and family history with the patient and they are unchanged from previous note.  ALLERGIES:  has No Known Allergies.  MEDICATIONS:  Current Outpatient Prescriptions  Medication Sig Dispense Refill  . aspirin EC 325 MG EC tablet Take 325 mg by  mouth daily.        Marland Kitchen atorvastatin (LIPITOR) 80 MG tablet Take 1 tablet (80 mg total) by mouth daily.  90 tablet  3  . calcium carbonate (OS-CAL) 600 MG TABS Take 600 mg by mouth 2 (two) times daily with a meal.      . cholecalciferol (VITAMIN D) 1000 UNITS tablet Take 1,000 Units by mouth daily.      . clobetasol cream (TEMOVATE) 8.10 % Apply 1 application topically 2 (two) times daily.       . fish oil-omega-3 fatty acids 1000 MG capsule Take 2 g by mouth daily.      . Multiple Vitamin (MULTIVITAMIN) tablet Take 1 tablet by mouth daily.      . Sodium Fluoride 1.1 % PSTE Apply thin ribbon of cream to toothbrush. Brush teeth for 2 minutes at bedtime. Spit out excess. Do not swallow. Either this or fluorishield      . tretinoin (RETIN-A) 0.025 % cream As Directed      . UBIQUINOL PO Take 1 tablet by mouth daily.      . vitamin C (ASCORBIC ACID) 500 MG tablet Take 500 mg by mouth daily.      . vitamin E 400 UNIT capsule Take 400 Units by mouth daily.       No current facility-administered medications for this visit.   Facility-Administered Medications Ordered in Other Visits  Medication Dose Route Frequency Provider Last Rate Last Dose  . topical emolient (BIAFINE) emulsion   Topical BID Marye Round, MD        REVIEW OF SYSTEMS:   Constitutional: Denies fevers, chills or abnormal weight loss Eyes: Denies blurriness of vision Ears, nose,  mouth, throat, and face: Denies mucositis or sore throat Respiratory: Denies cough, dyspnea or wheezes Cardiovascular: Denies palpitation, chest discomfort or lower extremity swelling Gastrointestinal:  Denies nausea, heartburn or change in bowel habits Skin: Denies abnormal skin rashes Lymphatics: Denies new lymphadenopathy or easy bruising Neurological:Denies numbness, tingling or new weaknesses Behavioral/Psych: Mood is stable, no new changes  All other systems were reviewed with the patient and are negative.  PHYSICAL EXAMINATION: ECOG  PERFORMANCE STATUS: 0 - Asymptomatic  Filed Vitals:   10/28/13 1424  BP: 149/79  Pulse: 63  Temp: 98 F (36.7 C)  Resp: 20   Filed Weights   10/28/13 1424  Weight: 180 lb 1.6 oz (81.693 kg)    GENERAL:alert, no distress and comfortable SKIN: skin color, texture, turgor are normal, no rashes or significant lesions EYES: normal, Conjunctiva are pink and non-injected, sclera clear OROPHARYNX:no exudate, no erythema and lips, buccal mucosa, and tongue normal . Poor dentition is noted NECK: supple, thyroid normal size, non-tender, without nodularity LYMPH:  no palpable lymphadenopathy in the cervical, axillary or inguinal LUNGS: clear to auscultation and percussion with normal breathing effort HEART: regular rate & rhythm and no murmurs and no lower extremity edema ABDOMEN:abdomen soft, non-tender and normal bowel sounds Musculoskeletal:no cyanosis of digits and no clubbing  NEURO: alert & oriented x 3 with fluent speech, no focal motor/sensory deficits. Noted hearing deficits  LABORATORY DATA:  I have reviewed the data as listed    Component Value Date/Time   NA 140 10/28/2013 1413   NA 141 03/10/2013 0910   K 4.4 10/28/2013 1413   K 4.2 03/10/2013 0910   CL 108 03/10/2013 0910   CL 106 10/22/2012 0829   CO2 25 10/28/2013 1413   CO2 25 03/10/2013 0910   GLUCOSE 144* 10/28/2013 1413   GLUCOSE 98 03/10/2013 0910   GLUCOSE 101* 10/22/2012 0829   BUN 21.6 10/28/2013 1413   BUN 16 03/10/2013 0910   CREATININE 1.1 10/28/2013 1413   CREATININE 1.0 03/10/2013 0910   CALCIUM 9.3 10/28/2013 1413   CALCIUM 9.2 03/10/2013 0910   PROT 6.3* 10/28/2013 1413   PROT 6.7 03/10/2013 0910   ALBUMIN 3.5 10/28/2013 1413   ALBUMIN 3.6 03/10/2013 0910   AST 19 10/28/2013 1413   AST 18 03/10/2013 0910   ALT 17 10/28/2013 1413   ALT 15 03/10/2013 0910   ALKPHOS 71 10/28/2013 1413   ALKPHOS 61 03/10/2013 0910   BILITOT 0.61 10/28/2013 1413   BILITOT 1.1 03/10/2013 0910   GFRNONAA 85* 07/30/2011 0506   GFRAA >90  07/30/2011 0506    No results found for this basename: SPEP, UPEP,  kappa and lambda light chains    Lab Results  Component Value Date   WBC 4.1 10/28/2013   NEUTROABS 2.8 10/28/2013   HGB 13.8 10/28/2013   HCT 41.6 10/28/2013   MCV 92.0 10/28/2013   PLT 160 10/28/2013      Chemistry      Component Value Date/Time   NA 140 10/28/2013 1413   NA 141 03/10/2013 0910   K 4.4 10/28/2013 1413   K 4.2 03/10/2013 0910   CL 108 03/10/2013 0910   CL 106 10/22/2012 0829   CO2 25 10/28/2013 1413   CO2 25 03/10/2013 0910   BUN 21.6 10/28/2013 1413   BUN 16 03/10/2013 0910   CREATININE 1.1 10/28/2013 1413   CREATININE 1.0 03/10/2013 0910      Component Value Date/Time   CALCIUM 9.3 10/28/2013 1413   CALCIUM  9.2 03/10/2013 0910   ALKPHOS 71 10/28/2013 1413   ALKPHOS 61 03/10/2013 0910   AST 19 10/28/2013 1413   AST 18 03/10/2013 0910   ALT 17 10/28/2013 1413   ALT 15 03/10/2013 0910   BILITOT 0.61 10/28/2013 1413   BILITOT 1.1 03/10/2013 0910     ASSESSMENT & PLAN:  #1 squamous cell carcinoma of the tongue I recommend he continue followup with his ENT surgeon as well as radiation oncologist on a regular basis. I would discontinue routine imaging study. I will see him every 6 months with history, physical examination and blood work #2 persistent dry mouth The patient is comfortable just drink water regularly and discontinued the use of Salagen #3 hearing deficit I furnished him with a letter to help support him to get his hearing aid covered by his insurance. 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 He had retropubic prostatectomy in 2002 and his last PSA were within normal limits #5 oral osteonecrosis of the jaw I encouraged the patient to continue followup with his dentist on a regular basis #6 hypertension #7 history of coronary artery disease status post bypass surgery I recommend him to  followup with his cardiologist on a regular basis. The patient is currently on aspirin, fish oil and Lipitor. If his blood pressure remains elevated he may need to be placed on the blood pressure medicine as well #8 preventive care He has received influenza vaccination recently.  Orders Placed This Encounter  Procedures  . CBC with Differential    Standing Status: Future     Number of Occurrences:      Standing Expiration Date: 10/28/2014  . Comprehensive metabolic panel    Standing Status: Future     Number of Occurrences:      Standing Expiration Date: 10/28/2014  . T4, free    Standing Status: Future     Number of Occurrences:      Standing Expiration Date: 10/28/2014  . TSH    Standing Status: Future     Number of Occurrences:      Standing Expiration Date: 10/28/2014   All questions were answered. The patient knows to call the clinic with any problems, questions or concerns. No barriers to learning was detected. I spent 40 minutes counseling the patient face to face. The total time spent in the appointment was 55 minutes and more than 50% was on counseling and review of test results     [facility], [place], MD 10/28/2013 4:37 PM

## 2013-10-28 NOTE — Telephone Encounter (Signed)
Gave pt appt for lab and MD  for August 2015 °

## 2013-11-10 ENCOUNTER — Ambulatory Visit: Payer: Self-pay | Admitting: Cardiology

## 2013-12-24 ENCOUNTER — Ambulatory Visit: Payer: Self-pay | Admitting: Cardiology

## 2013-12-29 DIAGNOSIS — H6120 Impacted cerumen, unspecified ear: Secondary | ICD-10-CM | POA: Diagnosis not present

## 2013-12-29 DIAGNOSIS — H903 Sensorineural hearing loss, bilateral: Secondary | ICD-10-CM | POA: Diagnosis not present

## 2013-12-29 DIAGNOSIS — Z85810 Personal history of malignant neoplasm of tongue: Secondary | ICD-10-CM | POA: Diagnosis not present

## 2013-12-31 DIAGNOSIS — L570 Actinic keratosis: Secondary | ICD-10-CM | POA: Diagnosis not present

## 2013-12-31 DIAGNOSIS — Z85828 Personal history of other malignant neoplasm of skin: Secondary | ICD-10-CM | POA: Diagnosis not present

## 2013-12-31 DIAGNOSIS — D485 Neoplasm of uncertain behavior of skin: Secondary | ICD-10-CM | POA: Diagnosis not present

## 2014-01-01 DIAGNOSIS — L570 Actinic keratosis: Secondary | ICD-10-CM | POA: Diagnosis not present

## 2014-01-01 DIAGNOSIS — D0460 Carcinoma in situ of skin of unspecified upper limb, including shoulder: Secondary | ICD-10-CM | POA: Diagnosis not present

## 2014-01-08 ENCOUNTER — Ambulatory Visit (INDEPENDENT_AMBULATORY_CARE_PROVIDER_SITE_OTHER): Payer: Medicare Other | Admitting: Cardiology

## 2014-01-08 ENCOUNTER — Encounter: Payer: Self-pay | Admitting: Cardiology

## 2014-01-08 VITALS — BP 136/70 | HR 62 | Ht 69.0 in | Wt 175.0 lb

## 2014-01-08 DIAGNOSIS — I7121 Aneurysm of the ascending aorta, without rupture: Secondary | ICD-10-CM

## 2014-01-08 DIAGNOSIS — I712 Thoracic aortic aneurysm, without rupture, unspecified: Secondary | ICD-10-CM

## 2014-01-08 DIAGNOSIS — Q211 Atrial septal defect: Secondary | ICD-10-CM

## 2014-01-08 DIAGNOSIS — I25810 Atherosclerosis of coronary artery bypass graft(s) without angina pectoris: Secondary | ICD-10-CM | POA: Diagnosis not present

## 2014-01-08 DIAGNOSIS — E7800 Pure hypercholesterolemia, unspecified: Secondary | ICD-10-CM | POA: Diagnosis not present

## 2014-01-08 DIAGNOSIS — I259 Chronic ischemic heart disease, unspecified: Secondary | ICD-10-CM

## 2014-01-08 DIAGNOSIS — Q2111 Secundum atrial septal defect: Secondary | ICD-10-CM

## 2014-01-08 NOTE — Progress Notes (Signed)
Jason Noble Date of Birth:  07-Nov-1940 [facility] [address] [place], [place]  [address] [phone]         Fax   [phone]  History of Present Illness: This pleasant 73 year old gentleman is seen for a one-year followup office visit.  He is a former patient of Dr. Lia Foyer.  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 The patient has a complex past medical history.  He has known coronary disease and had three-vessel CABG in 1991 by Dr. Servando Snare. In 2001 the patient had prostate cancer treated with retropubic radical prostatectomy. 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 In 2009 the patient had a mild stroke from a tiny PFO.  Surgical closure was not recommended. In August 2010 the patient had a laparoscopic cholecystectomy by Dr. Greer Pickerel.  There were postoperative complications and the patient underwent a repeat laparoscopic cholecystectomy in November 2010.  He continued to have problems and finally in August 2011 underwent an open laparoscopy for residual problems in the gallbladder fossa.  Since then he has done well regarding his gastrointestinal tract. 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  Dr. Lisbeth Renshaw is his radiation oncologist and Dr. Lamonte Sakai was his chemotherapy oncologist.  He did not require surgery for his cancer of the tongue. The patient has a known dilated ascending aorta which last measured 4.4 x 4.1 cm in maximum dimensions new the aortic root by CT scan done in 2009.  The patient has been reluctant to have any additional radiation to his chest area because of the radiation he has already encountered with his cancer.  Therefore he is reluctant to have a CT angiogram of the chest at this time.  He had an  echocardiogram on 12/4812 which showed an ejection fraction of 50-55% and his aortic root was 4.4 cm and has a sitting aorta was 4.5 cm. Current Outpatient Prescriptions  Medication Sig Dispense Refill  . aspirin EC 325 MG EC tablet Take 325 mg by mouth daily.        Marland Kitchen atorvastatin (LIPITOR) 80 MG tablet Take 1 tablet (80 mg total) by mouth daily.  90 tablet  3  . calcium carbonate (OS-CAL) 600 MG TABS Take 600 mg by mouth 2 (two) times daily with a meal.      . cholecalciferol (VITAMIN D) 1000 UNITS tablet Take 1,000 Units by mouth daily.      . clobetasol cream (TEMOVATE) 8.41 % Apply 1 application topically 2 (two) times daily.       . fish oil-omega-3 fatty acids 1000 MG capsule Take 2 g by mouth daily.      . Multiple Vitamin (MULTIVITAMIN) tablet Take 1 tablet by mouth daily.      . Sodium Fluoride 1.1 % PSTE Apply thin ribbon of cream to toothbrush. Brush teeth for 2 minutes at bedtime. Spit out excess. Do not swallow. Either this or fluorishield      . tretinoin (RETIN-A) 0.025 % cream As Directed      . UBIQUINOL PO Take 1 tablet by mouth daily.      . vitamin C (ASCORBIC ACID) 500 MG tablet Take 500 mg by mouth daily.      . vitamin E 400 UNIT capsule Take 400 Units by  mouth daily.       No current facility-administered medications for this visit.   Facility-Administered Medications Ordered in Other Visits  Medication Dose Route Frequency Provider Last Rate Last Dose  . topical emolient (BIAFINE) emulsion   Topical BID Marye Round, MD        No Known Allergies  Patient Active Problem List   Diagnosis Date Noted  . Malignant neoplasm of base of tongue 07/27/2011  . Fatigue 07/26/2011  . Skin breakdown 07/26/2011  . ANEURYSM, THORACIC AORTIC 09/05/2010  . OTHER RETROPERITONEAL ABSCESS 11/29/2009  . PATENT FORAMEN OVALE 11/28/2009  . MYOCARDIAL INFARCTION, HX OF 11/24/2009  . CAD, ARTERY BYPASS GRAFT 11/24/2009  . PROSTATE CANCER, HX OF 11/24/2009  . CEREBROVASCULAR  ACCIDENT, HX OF 11/24/2009  . HYPERCHOLESTEROLEMIA 11/23/2009  . MALAISE AND FATIGUE 11/23/2009  . CHEST WALL PAIN, ACUTE 11/23/2009  . CHOLECYSTECTOMY, HX OF 11/23/2009    History  Smoking status  . Former Smoker  . Quit date: 09/17/1965  Smokeless tobacco  . Never Used    History  Alcohol Use No    Comment: rare    Family History  Problem Relation Age of Onset  . Stroke Mother   . Coronary artery disease Brother     had bypass    Review of Systems: Constitutional: no fever chills diaphoresis or fatigue or change in weight.  Head and neck: no hearing loss, no epistaxis, no photophobia or visual disturbance. Respiratory: No cough, shortness of breath or wheezing. Cardiovascular: No chest pain peripheral edema, palpitations. Gastrointestinal: No abdominal distention, no abdominal pain, no change in bowel habits hematochezia or melena. Genitourinary: No dysuria, no frequency, no urgency, no nocturia. Musculoskeletal:No arthralgias, no back pain, no gait disturbance or myalgias. Neurological: No dizziness, no headaches, no numbness, no seizures, no syncope, no weakness, no tremors. Hematologic: No lymphadenopathy, no easy bruising. Psychiatric: No confusion, no hallucinations, no sleep disturbance.    Physical Exam: Filed Vitals:   01/08/14 0959  BP: 136/70  Pulse: 62   the general appearance reveals a well-developed well-nourished gentleman in no distress.The head and neck exam reveals pupils equal and reactive.  Extraocular movements are full.  There is no scleral icterus.  .  The neck is supple.  The carotids reveal no bruits.  The jugular venous pressure is normal.  The  thyroid is not enlarged.  There is no lymphadenopathy.  The chest is clear to percussion and auscultation.  There are no rales or rhonchi.  Expansion of the chest is symmetrical.  The precordium is quiet.  The first heart sound is normal.  The second heart sound is physiologically split.  There is no  murmur gallop rub or click.  There is no abnormal lift or heave.  The abdomen is soft and nontender.  The bowel sounds are normal.  The liver and spleen are not enlarged.  There are no abdominal masses.  There are no abdominal bruits.  Extremities reveal good pedal pulses.  There is no phlebitis or edema.  There is no cyanosis or clubbing.  Strength is normal and symmetrical in all extremities.  There is no lateralizing weakness.  There are no sensory deficits.  The skin is warm and dry.  There is no rash.  EKG shows normal sinus rhythm and is within normal limits   Assessment / Plan: 1.  Ischemic heart disease status post CABG in 1991 and status post stenting of saphenous vein graft in 2008 with a bare-metal stent, doing well with  no recurrent angina. 2. ascending thoracic aortic aneurysm, last checked in 2009 3. history of prostate cancer treated with retropubic radical prostatectomy. 4. past history of cancer of the tongue treated with radiotherapy and chemotherapy 5. past history of cholelithiasis with history of retroperitoneal abscess following surgery. 6. hypercholesterolemia, off statins since June 2013 7. known small PFO with past history of small stroke 8. concerned about getting too much irradiation.  We are now following his aneurysm with echocardiogram  Plan: Continue current medication.  Return next week for fasting lab work.  Recheck in one year for office visit EKG lipid panel hepatic function panel and basal metabolic panel.  After next office visit update his echo.

## 2014-01-08 NOTE — Assessment & Plan Note (Signed)
The patient has not had any recurrent TIA or stroke symptoms. 

## 2014-01-08 NOTE — Assessment & Plan Note (Signed)
The patient has not had any recurrent chest pain or angina.  Of note is since we last saw him his brother aged 73 died of a heart attack.  Also of note is that the patient's father lived to be 102

## 2014-01-08 NOTE — Assessment & Plan Note (Signed)
Patient has a history of hypercholesterolemia.  He did not come fasting today so we will have him return for fasting lab work next week.  He is not having any side effects from his statin therapy

## 2014-01-08 NOTE — Patient Instructions (Signed)
Your physician recommends that you continue on your current medications as directed. Please refer to the Current Medication list given to you today.  Your physician wants you to follow-up in: [place] EKG  You will receive a reminder letter in the mail two months in advance. If you don't receive a letter, please call our office to schedule the follow-up appointment.   RETURN NEXT WEEK (4/29) FOR FASTING LABS

## 2014-01-13 ENCOUNTER — Other Ambulatory Visit (INDEPENDENT_AMBULATORY_CARE_PROVIDER_SITE_OTHER): Payer: Medicare Other

## 2014-01-13 DIAGNOSIS — E7800 Pure hypercholesterolemia, unspecified: Secondary | ICD-10-CM | POA: Diagnosis not present

## 2014-01-13 LAB — BASIC METABOLIC PANEL
BUN: 18 mg/dL (ref 6–23)
CO2: 27 mEq/L (ref 19–32)
Calcium: 9.3 mg/dL (ref 8.4–10.5)
Chloride: 107 mEq/L (ref 96–112)
Creatinine, Ser: 1 mg/dL (ref 0.4–1.5)
GFR: 80.67 mL/min (ref >60.00)
Glucose, Bld: 94 mg/dL (ref 70–99)
Potassium: 4.3 mEq/L (ref 3.5–5.1)
Sodium: 139 mEq/L (ref 135–145)

## 2014-01-13 LAB — LIPID PANEL
Cholesterol: 149 mg/dL (ref 0–200)
HDL: 64.9 mg/dL (ref >39.00)
LDL Cholesterol: 76 mg/dL (ref 0–99)
Total CHOL/HDL Ratio: 2
Triglycerides: 40 mg/dL (ref 0.0–149.0)
VLDL: 8 mg/dL (ref 0.0–40.0)

## 2014-01-13 LAB — HEPATIC FUNCTION PANEL
ALT: 18 U/L (ref 0–53)
AST: 21 U/L (ref 0–37)
Albumin: 3.7 g/dL (ref 3.5–5.2)
Alkaline Phosphatase: 65 U/L (ref 39–117)
Bilirubin, Direct: 0.1 mg/dL (ref 0.0–0.3)
Total Bilirubin: 0.8 mg/dL (ref 0.3–1.2)
Total Protein: 6.4 g/dL (ref 6.0–8.3)

## 2014-01-13 NOTE — Progress Notes (Signed)
Quick Note:  Please report to patient. The recent labs are stable. Continue same medication and careful diet. Lipids all satisfactory. CSD. ______

## 2014-01-14 DIAGNOSIS — D485 Neoplasm of uncertain behavior of skin: Secondary | ICD-10-CM | POA: Diagnosis not present

## 2014-01-14 DIAGNOSIS — D0460 Carcinoma in situ of skin of unspecified upper limb, including shoulder: Secondary | ICD-10-CM | POA: Diagnosis not present

## 2014-01-15 DIAGNOSIS — L570 Actinic keratosis: Secondary | ICD-10-CM | POA: Diagnosis not present

## 2014-01-26 DIAGNOSIS — L570 Actinic keratosis: Secondary | ICD-10-CM | POA: Diagnosis not present

## 2014-01-26 DIAGNOSIS — Z4802 Encounter for removal of sutures: Secondary | ICD-10-CM | POA: Diagnosis not present

## 2014-01-26 DIAGNOSIS — D0460 Carcinoma in situ of skin of unspecified upper limb, including shoulder: Secondary | ICD-10-CM | POA: Diagnosis not present

## 2014-01-27 ENCOUNTER — Telehealth: Payer: Self-pay | Admitting: Cardiology

## 2014-01-27 NOTE — Telephone Encounter (Signed)
New message    Dr. Mare Ferrari gave him a name of Orthopedic.  Patient is asking for that name again.

## 2014-01-27 NOTE — Telephone Encounter (Signed)
Calling wanting to verify Orthopedic doctor that Dr. Mare Ferrari suggested he see.  Thinks it was [facility].  Advised that Dr. Gaynelle Arabian is an orthopedic surgeon.  Gave the  address and phone number to his wife.

## 2014-02-12 DIAGNOSIS — H902 Conductive hearing loss, unspecified: Secondary | ICD-10-CM | POA: Diagnosis not present

## 2014-02-18 DIAGNOSIS — L570 Actinic keratosis: Secondary | ICD-10-CM | POA: Diagnosis not present

## 2014-02-18 DIAGNOSIS — D0460 Carcinoma in situ of skin of unspecified upper limb, including shoulder: Secondary | ICD-10-CM | POA: Diagnosis not present

## 2014-02-18 DIAGNOSIS — D485 Neoplasm of uncertain behavior of skin: Secondary | ICD-10-CM | POA: Diagnosis not present

## 2014-02-23 DIAGNOSIS — Z8546 Personal history of malignant neoplasm of prostate: Secondary | ICD-10-CM | POA: Diagnosis not present

## 2014-02-23 DIAGNOSIS — N529 Male erectile dysfunction, unspecified: Secondary | ICD-10-CM | POA: Diagnosis not present

## 2014-02-24 ENCOUNTER — Ambulatory Visit
Admission: RE | Admit: 2014-02-24 | Discharge: 2014-02-24 | Disposition: A | Discharge status: Home / Self Care | Payer: Medicare Other | Source: Ambulatory Visit | Attending: Radiation Oncology | Admitting: Radiation Oncology

## 2014-02-24 ENCOUNTER — Encounter: Payer: Self-pay | Admitting: Radiation Oncology

## 2014-02-24 VITALS — BP 115/72 | HR 76 | Temp 98.4°F | Resp 14 | Ht 69.0 in | Wt 174.0 lb

## 2014-02-24 DIAGNOSIS — Z7982 Long term (current) use of aspirin: Secondary | ICD-10-CM | POA: Insufficient documentation

## 2014-02-24 DIAGNOSIS — C01 Malignant neoplasm of base of tongue: Secondary | ICD-10-CM | POA: Diagnosis not present

## 2014-02-24 DIAGNOSIS — Z79899 Other long term (current) drug therapy: Secondary | ICD-10-CM | POA: Insufficient documentation

## 2014-02-24 MED ORDER — LARYNGOSCOPY SOLUTION RAD-ONC
15.0000 mL | Freq: Once | TOPICAL | Status: AC
  Administered 2014-02-24: 15 mL via TOPICAL
  Filled 2014-02-24: qty 15

## 2014-02-24 NOTE — Progress Notes (Addendum)
No difficulty breathing. He is currently in no pain. Pt presenting appropriate quality, quantity and organization of sentences. Pt denies dysphagia. The patient eats a regular, healthy diet. Noted mucous membranes moist, good dental hygeine. Pt complains of excessive amount of sputum, which he is spitting into a cup.

## 2014-02-25 DIAGNOSIS — C44320 Squamous cell carcinoma of skin of unspecified parts of face: Secondary | ICD-10-CM | POA: Diagnosis not present

## 2014-02-28 NOTE — Progress Notes (Signed)
Radiation Oncology         [phone] ________________________________  Name: Jason Noble MRN: [id number]  Date: 02/24/2014  DOB: 1941-01-10  Follow-Up Visit Note  CC: Shirline Frees, MD  Izora Gala, MD  Diagnosis:   Squamous cell carcinoma of the base of tongue  Interval Since Last Radiation:  2-1/2 years   Narrative:  The patient returns today for routine follow-up.  The patient states he continues to do fairly well. The patient denies any pain at this time. He really has been focusing on eating a healthy diet and he believes he is doing well with this. No difficulties swallowing or odynophagia. Continued xerostomia but this has improved a little more since he last was seen.                              ALLERGIES:  has No Known Allergies.  Meds: Current Outpatient Prescriptions  Medication Sig Dispense Refill  . aspirin EC 325 MG EC tablet Take 325 mg by mouth daily.        Marland Kitchen atorvastatin (LIPITOR) 80 MG tablet Take 1 tablet (80 mg total) by mouth daily.  90 tablet  3  . calcium carbonate (OS-CAL) 600 MG TABS Take 600 mg by mouth 2 (two) times daily with a meal.      . cholecalciferol (VITAMIN D) 1000 UNITS tablet Take 1,000 Units by mouth daily.      . clobetasol cream (TEMOVATE) 6.96 % Apply 1 application topically 2 (two) times daily.       . fish oil-omega-3 fatty acids 1000 MG capsule Take 2 g by mouth daily.      . Multiple Vitamin (MULTIVITAMIN) tablet Take 1 tablet by mouth daily.      . Sodium Fluoride 1.1 % PSTE Apply thin ribbon of cream to toothbrush. Brush teeth for 2 minutes at bedtime. Spit out excess. Do not swallow. Either this or fluorishield      . tretinoin (RETIN-A) 0.025 % cream As Directed      . UBIQUINOL PO Take 1 tablet by mouth daily.      . vitamin C (ASCORBIC ACID) 500 MG tablet Take 500 mg by mouth daily.      . vitamin E 400 UNIT capsule Take 400 Units by mouth daily.       No current facility-administered medications for this encounter.    Facility-Administered Medications Ordered in Other Encounters  Medication Dose Route Frequency Provider Last Rate Last Dose  . topical emolient (BIAFINE) emulsion   Topical BID Marye Round, MD        Physical Findings: The patient is in no acute distress. Patient is alert and oriented.  height is 5\' 9"  (1.753 m) and weight is 174 lb (78.926 kg). His oral temperature is 98.4 F (36.9 C). His blood pressure is 115/72 and his pulse is 76. His respiration is 14 and oxygen saturation is 97%. .   Oral cavity clear with no suspicious findings. Some moistness present. No cervical lymphadenopathy present. His skin looks very good post radiation treatment Fiberoptic exam: After the use of topical anesthetic, the flexible laryngoscope was passed through the right near. Good visualization was obtained. No lesions or suspicious findings within the larynx, hypopharynx, oropharynx or nasopharynx.   Lab Findings: Lab Results  Component Value Date   WBC 4.1 10/28/2013   HGB 13.8 10/28/2013   HCT 41.6 10/28/2013   MCV 92.0 10/28/2013  PLT 160 10/28/2013     Radiographic Findings: No results found.  Impression:    The patient is doing well at this time clinically, he remains clinically NED with some continued improvement in symptoms such as xerostomia. Fiberoptic exam look good today with no concerning findings.  Plan:  Patient will recurrent to clinic for ongoing followup in 6 months.   Jodelle Gross, M.D., Ph.D.

## 2014-03-04 ENCOUNTER — Ambulatory Visit: Payer: Medicare Other | Admitting: Radiation Oncology

## 2014-03-05 DIAGNOSIS — N529 Male erectile dysfunction, unspecified: Secondary | ICD-10-CM | POA: Diagnosis not present

## 2014-03-12 DIAGNOSIS — H2510 Age-related nuclear cataract, unspecified eye: Secondary | ICD-10-CM | POA: Diagnosis not present

## 2014-03-12 DIAGNOSIS — H5200 Hypermetropia, unspecified eye: Secondary | ICD-10-CM | POA: Diagnosis not present

## 2014-03-12 DIAGNOSIS — D3130 Benign neoplasm of unspecified choroid: Secondary | ICD-10-CM | POA: Diagnosis not present

## 2014-03-15 DIAGNOSIS — Z8546 Personal history of malignant neoplasm of prostate: Secondary | ICD-10-CM | POA: Diagnosis not present

## 2014-03-15 DIAGNOSIS — N529 Male erectile dysfunction, unspecified: Secondary | ICD-10-CM | POA: Diagnosis not present

## 2014-03-23 DIAGNOSIS — M1610 Unilateral primary osteoarthritis, unspecified hip: Secondary | ICD-10-CM | POA: Diagnosis not present

## 2014-04-27 ENCOUNTER — Other Ambulatory Visit (HOSPITAL_BASED_OUTPATIENT_CLINIC_OR_DEPARTMENT_OTHER): Payer: Medicare Other

## 2014-04-27 ENCOUNTER — Encounter: Payer: Self-pay | Admitting: Hematology and Oncology

## 2014-04-27 ENCOUNTER — Ambulatory Visit (HOSPITAL_BASED_OUTPATIENT_CLINIC_OR_DEPARTMENT_OTHER): Payer: Medicare Other | Admitting: Hematology and Oncology

## 2014-04-27 VITALS — BP 135/82 | HR 66 | Temp 98.4°F | Resp 18 | Ht 69.0 in | Wt 171.6 lb

## 2014-04-27 DIAGNOSIS — H9190 Unspecified hearing loss, unspecified ear: Secondary | ICD-10-CM | POA: Insufficient documentation

## 2014-04-27 DIAGNOSIS — C01 Malignant neoplasm of base of tongue: Secondary | ICD-10-CM

## 2014-04-27 DIAGNOSIS — R5381 Other malaise: Secondary | ICD-10-CM | POA: Diagnosis not present

## 2014-04-27 DIAGNOSIS — R682 Dry mouth, unspecified: Secondary | ICD-10-CM

## 2014-04-27 DIAGNOSIS — K117 Disturbances of salivary secretion: Secondary | ICD-10-CM | POA: Diagnosis not present

## 2014-04-27 DIAGNOSIS — R5383 Other fatigue: Secondary | ICD-10-CM

## 2014-04-27 DIAGNOSIS — Z8546 Personal history of malignant neoplasm of prostate: Secondary | ICD-10-CM | POA: Diagnosis not present

## 2014-04-27 DIAGNOSIS — H9193 Unspecified hearing loss, bilateral: Secondary | ICD-10-CM

## 2014-04-27 LAB — COMPREHENSIVE METABOLIC PANEL (CC13)
ALT: 11 U/L (ref 0–55)
AST: 15 U/L (ref 5–34)
Albumin: 3.6 g/dL (ref 3.5–5.0)
Alkaline Phosphatase: 61 U/L (ref 40–150)
Anion Gap: 8 mEq/L (ref 3–11)
BUN: 20.4 mg/dL (ref 7.0–26.0)
CO2: 27 mEq/L (ref 22–29)
Calcium: 9.5 mg/dL (ref 8.4–10.4)
Chloride: 107 mEq/L (ref 98–109)
Creatinine: 1.3 mg/dL (ref 0.7–1.3)
Glucose: 82 mg/dl (ref 70–140)
Potassium: 4.6 mEq/L (ref 3.5–5.1)
Sodium: 142 mEq/L (ref 136–145)
Total Bilirubin: 0.68 mg/dL (ref 0.20–1.20)
Total Protein: 6.2 g/dL — ABNORMAL LOW (ref 6.4–8.3)

## 2014-04-27 LAB — CBC WITH DIFFERENTIAL/PLATELET
BASO%: 0.2 % (ref 0.0–2.0)
Basophils Absolute: 0 10*3/uL (ref 0.0–0.1)
EOS%: 6.5 % (ref 0.0–7.0)
Eosinophils Absolute: 0.3 10*3/uL (ref 0.0–0.5)
HCT: 42.1 % (ref 38.4–49.9)
HGB: 13.9 g/dL (ref 13.0–17.1)
LYMPH%: 16.4 % (ref 14.0–49.0)
MCH: 30.3 pg (ref 27.2–33.4)
MCHC: 33 g/dL (ref 32.0–36.0)
MCV: 91.7 fL (ref 79.3–98.0)
MONO#: 0.5 10*3/uL (ref 0.1–0.9)
MONO%: 8.8 % (ref 0.0–14.0)
NEUT#: 3.6 10*3/uL (ref 1.5–6.5)
NEUT%: 68.1 % (ref 39.0–75.0)
Platelets: 139 10*3/uL — ABNORMAL LOW (ref 140–400)
RBC: 4.59 10*6/uL (ref 4.20–5.82)
RDW: 13.7 % (ref 11.0–14.6)
WBC: 5.2 10*3/uL (ref 4.0–10.3)
lymph#: 0.9 10*3/uL (ref 0.9–3.3)

## 2014-04-27 NOTE — Progress Notes (Signed)
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 According to the patient, it started when he palpated a lump on the right side of his neck. He has extensive evaluation including biopsy, PET/CT scan and surgical evaluation. The patient subsequently was treated with concurrent chemotherapy with weekly cisplatin and radiation therapy between 06/18/2011 to 08/03/2011. According to the patient, he had lost 40 pounds of weight during treatment but subsequently came back 25 pounds of weight. 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 He still had persistent dry mouth and altered taste sensation.  INTERVAL HISTORY: Please see below for problem oriented charting. He still has persistent dry mouth. Denies dysphagia. Denies new lumps in the neck. His appetite is stable, no recent weight loss. He has persistent hearing deficit.  REVIEW OF SYSTEMS:   Constitutional: Denies fevers, chills or abnormal weight loss Eyes: Denies blurriness of vision Ears, nose, mouth, throat, and face: Denies mucositis or sore throat Respiratory: Denies cough, dyspnea or wheezes Cardiovascular: Denies palpitation, chest discomfort or lower extremity swelling Gastrointestinal:  Denies nausea, heartburn or change in bowel habits Skin: Denies abnormal skin rashes Lymphatics: Denies new lymphadenopathy or easy bruising Neurological:Denies  numbness, tingling or new weaknesses Behavioral/Psych: Mood is stable, no new changes  All other systems were reviewed with the patient and are negative.  I have reviewed the past medical history, past surgical history, social history and family history with the patient and they are unchanged from previous note.  ALLERGIES:  has No Known Allergies.  MEDICATIONS:  Current Outpatient Prescriptions  Medication Sig Dispense Refill  . aspirin EC 325 MG EC tablet Take 325 mg by mouth daily.        Marland Kitchen atorvastatin (LIPITOR) 80 MG tablet Take 1 tablet (80 mg total) by mouth daily.  90 tablet  3  . calcium carbonate (OS-CAL) 600 MG TABS Take 600 mg by mouth 2 (two) times daily with a meal.      . cholecalciferol (VITAMIN D) 1000 UNITS tablet Take 1,000 Units by mouth daily.      . clobetasol cream (TEMOVATE) 9.39 % Apply 1 application topically 2 (two) times daily.       . fish oil-omega-3 fatty acids 1000 MG capsule Take 2 g by mouth daily.      . Multiple Vitamin (MULTIVITAMIN) tablet Take 1 tablet by mouth daily.      . Sodium Fluoride 1.1 % PSTE Apply thin ribbon of cream to toothbrush. Brush teeth for 2 minutes at bedtime. Spit out excess. Do not swallow. Either this or fluorishield      . tretinoin (RETIN-A) 0.025 % cream As Directed      . UBIQUINOL PO Take 1 tablet by mouth daily.      . vitamin C (ASCORBIC ACID) 500 MG tablet Take 500 mg by mouth daily.      . vitamin E 400 UNIT capsule Take 400 Units by mouth daily.       No current facility-administered  medications for this visit.   Facility-Administered Medications Ordered in Other Visits  Medication Dose Route Frequency Provider Last Rate Last Dose  . topical emolient (BIAFINE) emulsion   Topical BID Marye Round, MD        PHYSICAL EXAMINATION: ECOG PERFORMANCE STATUS: 0 - Asymptomatic  Filed Vitals:   04/27/14 1439  BP: 135/82  Pulse: 66  Temp: 98.4 F (36.9 C)  Resp: 18   Filed Weights   04/27/14 1439  Weight:  171 lb 9.6 oz (77.837 kg)    GENERAL:alert, no distress and comfortable SKIN: skin color, texture, turgor are normal, no rashes or significant lesions EYES: normal, Conjunctiva are pink and non-injected, sclera clear OROPHARYNX:no exudate, no erythema and lips, buccal mucosa, and tongue normal  NECK: supple, thyroid normal size, non-tender, without nodularity LYMPH:  no palpable lymphadenopathy in the cervical, axillary or inguinal LUNGS: clear to auscultation and percussion with normal breathing effort HEART: regular rate & rhythm and no murmurs and no lower extremity edema ABDOMEN:abdomen soft, non-tender and normal bowel sounds Musculoskeletal:no cyanosis of digits and no clubbing  NEURO: alert & oriented x 3 with fluent speech, no focal motor/sensory deficits. He has hearing aid  LABORATORY DATA:  I have reviewed the data as listed    Component Value Date/Time   NA 142 04/27/2014 1500   NA 139 01/13/2014 1004   K 4.6 04/27/2014 1500   K 4.3 01/13/2014 1004   CL 107 01/13/2014 1004   CL 106 10/22/2012 0829   CO2 27 04/27/2014 1500   CO2 27 01/13/2014 1004   GLUCOSE 82 04/27/2014 1500   GLUCOSE 94 01/13/2014 1004   GLUCOSE 101* 10/22/2012 0829   BUN 20.4 04/27/2014 1500   BUN 18 01/13/2014 1004   CREATININE 1.3 04/27/2014 1500   CREATININE 1.0 01/13/2014 1004   CALCIUM 9.5 04/27/2014 1500   CALCIUM 9.3 01/13/2014 1004   PROT 6.2* 04/27/2014 1500   PROT 6.4 01/13/2014 1004   ALBUMIN 3.6 04/27/2014 1500   ALBUMIN 3.7 01/13/2014 1004   AST 15 04/27/2014 1500   AST 21 01/13/2014 1004   ALT 11 04/27/2014 1500   ALT 18 01/13/2014 1004   ALKPHOS 61 04/27/2014 1500   ALKPHOS 65 01/13/2014 1004   BILITOT 0.68 04/27/2014 1500   BILITOT 0.8 01/13/2014 1004   GFRNONAA 85* 07/30/2011 0506   GFRAA >90 07/30/2011 0506    No results found for this basename: SPEP,  UPEP,   kappa and lambda light chains    Lab Results  Component Value Date   WBC 5.2 04/27/2014   NEUTROABS 3.6 04/27/2014   HGB 13.9  04/27/2014   HCT 42.1 04/27/2014   MCV 91.7 04/27/2014   PLT 139* 04/27/2014      Chemistry      Component Value Date/Time   NA 142 04/27/2014 1500   NA 139 01/13/2014 1004   K 4.6 04/27/2014 1500   K 4.3 01/13/2014 1004   CL 107 01/13/2014 1004   CL 106 10/22/2012 0829   CO2 27 04/27/2014 1500   CO2 27 01/13/2014 1004   BUN 20.4 04/27/2014 1500   BUN 18 01/13/2014 1004   CREATININE 1.3 04/27/2014 1500   CREATININE 1.0 01/13/2014 1004      Component Value Date/Time   CALCIUM 9.5 04/27/2014 1500   CALCIUM 9.3 01/13/2014 1004   ALKPHOS 61 04/27/2014 1500   ALKPHOS 65 01/13/2014 1004   AST 15 04/27/2014 1500   AST 21 01/13/2014 1004   ALT  11 04/27/2014 1500   ALT 18 01/13/2014 1004   BILITOT 0.68 04/27/2014 1500   BILITOT 0.8 01/13/2014 1004      ASSESSMENT & PLAN:  Malignant neoplasm of base of tongue Clinically, he has no signs of recurrence. I would defer to ENT surgeon for future followup. I would discharge him from this clinic.  PROSTATE CANCER, HX OF I would defer to his urologist for PSA monitoring in the future.  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 Hopefully his insurance will pay for hearing aid.  Dry mouth I encouraged frequent oral fluid intake.    Orders Placed This Encounter  Procedures  . Comprehensive metabolic panel   All questions were answered. The patient knows to call the clinic with any problems, questions or concerns. No barriers to learning was detected. I spent 15 minutes counseling the patient face to face. The total time spent in the appointment was 20 minutes and more than 50% was on counseling and review of test results     [facility], [place], MD 04/27/2014 3:41 PM

## 2014-04-27 NOTE — Assessment & Plan Note (Signed)
This is due to ototoxicity from chemotherapy. Hopefully his insurance will pay for hearing aid.

## 2014-04-27 NOTE — Assessment & Plan Note (Signed)
Clinically, he has no signs of recurrence. I would defer to ENT surgeon for future followup. I would discharge him from this clinic.

## 2014-04-27 NOTE — Assessment & Plan Note (Signed)
I would defer to his urologist for PSA monitoring in the future.

## 2014-04-27 NOTE — Assessment & Plan Note (Signed)
I encouraged frequent oral fluid intake.

## 2014-04-28 ENCOUNTER — Other Ambulatory Visit: Payer: Self-pay | Admitting: Dermatology

## 2014-04-28 DIAGNOSIS — L719 Rosacea, unspecified: Secondary | ICD-10-CM | POA: Diagnosis not present

## 2014-04-28 DIAGNOSIS — D485 Neoplasm of uncertain behavior of skin: Secondary | ICD-10-CM | POA: Diagnosis not present

## 2014-04-28 DIAGNOSIS — C4442 Squamous cell carcinoma of skin of scalp and neck: Secondary | ICD-10-CM | POA: Diagnosis not present

## 2014-04-28 DIAGNOSIS — D045 Carcinoma in situ of skin of trunk: Secondary | ICD-10-CM | POA: Diagnosis not present

## 2014-04-28 DIAGNOSIS — Z85828 Personal history of other malignant neoplasm of skin: Secondary | ICD-10-CM | POA: Diagnosis not present

## 2014-04-28 DIAGNOSIS — L570 Actinic keratosis: Secondary | ICD-10-CM | POA: Diagnosis not present

## 2014-04-28 DIAGNOSIS — D0470 Carcinoma in situ of skin of unspecified lower limb, including hip: Secondary | ICD-10-CM | POA: Diagnosis not present

## 2014-04-28 LAB — TSH: TSH: 3.131 u[IU]/mL (ref 0.350–4.500)

## 2014-04-28 LAB — T4, FREE: Free T4: 1.02 ng/dL (ref 0.80–1.80)

## 2014-05-04 DIAGNOSIS — M169 Osteoarthritis of hip, unspecified: Secondary | ICD-10-CM | POA: Diagnosis not present

## 2014-05-04 DIAGNOSIS — M1610 Unilateral primary osteoarthritis, unspecified hip: Secondary | ICD-10-CM | POA: Diagnosis not present

## 2014-06-03 DIAGNOSIS — D0470 Carcinoma in situ of skin of unspecified lower limb, including hip: Secondary | ICD-10-CM | POA: Diagnosis not present

## 2014-06-03 DIAGNOSIS — D045 Carcinoma in situ of skin of trunk: Secondary | ICD-10-CM | POA: Diagnosis not present

## 2014-07-05 DIAGNOSIS — D492 Neoplasm of unspecified behavior of bone, soft tissue, and skin: Secondary | ICD-10-CM | POA: Diagnosis not present

## 2014-07-05 DIAGNOSIS — C4442 Squamous cell carcinoma of skin of scalp and neck: Secondary | ICD-10-CM | POA: Diagnosis not present

## 2014-07-05 DIAGNOSIS — C44310 Basal cell carcinoma of skin of unspecified parts of face: Secondary | ICD-10-CM | POA: Diagnosis not present

## 2014-07-05 DIAGNOSIS — L570 Actinic keratosis: Secondary | ICD-10-CM | POA: Diagnosis not present

## 2014-07-05 DIAGNOSIS — L905 Scar conditions and fibrosis of skin: Secondary | ICD-10-CM | POA: Diagnosis not present

## 2014-07-13 DIAGNOSIS — Z23 Encounter for immunization: Secondary | ICD-10-CM | POA: Diagnosis not present

## 2014-07-15 DIAGNOSIS — B349 Viral infection, unspecified: Secondary | ICD-10-CM | POA: Diagnosis not present

## 2014-07-15 DIAGNOSIS — M791 Myalgia: Secondary | ICD-10-CM | POA: Diagnosis not present

## 2014-07-15 DIAGNOSIS — K4090 Unilateral inguinal hernia, without obstruction or gangrene, not specified as recurrent: Secondary | ICD-10-CM | POA: Diagnosis not present

## 2014-07-19 DIAGNOSIS — L570 Actinic keratosis: Secondary | ICD-10-CM | POA: Diagnosis not present

## 2014-07-28 DIAGNOSIS — Z23 Encounter for immunization: Secondary | ICD-10-CM | POA: Diagnosis not present

## 2014-08-02 DIAGNOSIS — C44319 Basal cell carcinoma of skin of other parts of face: Secondary | ICD-10-CM | POA: Diagnosis not present

## 2014-08-04 ENCOUNTER — Encounter (INDEPENDENT_AMBULATORY_CARE_PROVIDER_SITE_OTHER): Payer: Self-pay | Admitting: General Surgery

## 2014-08-04 DIAGNOSIS — K4090 Unilateral inguinal hernia, without obstruction or gangrene, not specified as recurrent: Secondary | ICD-10-CM | POA: Diagnosis not present

## 2014-08-04 NOTE — Progress Notes (Unsigned)
Patient ID: Jason Noble, male   DOB: 03/19/1941, 73 y.o.   MRN: [id number]  Jason Noble. Tomer 08/04/2014 11:31 AM Location: [facility] Patient #: [id number] DOB: Dec 29, 1940 Married / Language: Cleophus Molt / Race: White Male History of Present Illness Jason Noble; 08/04/2014 1:06 PM) Patient words: right inguinal hernia.  The patient is a 73 year old male    Note:He is referred by Dr. Harlan Stains because of a symptomatic right inguinal hernia. He began having pain in the right groin area and sometimes it was significant. He noticed that when he put pressure on the area the pain improved. He does not see a visible bulge. He saw Dr. Dema Severin and she found a right inguinal hernia. He does presents for further evaluation and treatment of that. He denies difficulty with urination following his radical retropubic prostatectomy many years ago. He has occasional constipation. No chronic cough. His wife is here with him. He is wearing a truss to help with the discomfort.  Other Problems Ventura Sellers, [place]; 08/04/2014 11:32 AM) Cancer Cerebrovascular Accident Cholelithiasis Hypercholesterolemia Inguinal Hernia Myocardial infarction Other disease, cancer, significant illness Prostate Cancer Umbilical Hernia Repair  Past Surgical History Ventura Sellers, [place]; 08/04/2014 11:32 AM) Bypass Surgery for Poor Blood Flow to Legs Coronary Artery Bypass Graft Gallbladder Surgery - Laparoscopic Prostate Surgery - Removal Tonsillectomy Vasectomy Ventral / Umbilical Hernia Surgery Bilateral.  Diagnostic Studies History Ventura Sellers, [place]; 08/04/2014 11:32 AM) Colonoscopy 1-5 years ago  Allergies Ventura Sellers, CMA; 08/04/2014 11:33 AM) No Known Drug Allergies 08/04/2014  Medication History Ventura Sellers, CMA; 08/04/2014 11:37 AM) Atorvastatin Calcium (40MG  Tablet, Oral) Active. Chelated Calcium (200MG  Tablet, Oral)  Active. E 400 Blended (400UNIT Capsule, Oral) Active. Boswellia Serrata Extract Active. Fish Oil (1200MG  Capsule DR, Oral) Active. Ubiquinol (100MG  Capsule, Oral) Active. Turmeric Curcumin (Oral) Active. C 1000-Bioflavonoids-Rose Hips (1000-25MG  Capsule, Oral) Active. Glutamine (Oral) Active. Multi Complete (Oral) Active. Healthy Eyes (Oral) Active. Vitamin D (Cholecalciferol) (1000UNIT Tablet, Oral) Active. Enteric Coated Aspirin (500MG  Tablet DR, Oral) Active.  Social History Ventura Sellers, [place]; 08/04/2014 11:32 AM) Alcohol use Occasional alcohol use. Caffeine use Carbonated beverages, Coffee. No drug use Tobacco use Former smoker.  Family History Ventura Sellers, [place]; 08/04/2014 11:32 AM) Cerebrovascular Accident Brother. Heart Disease Brother, Mother. Heart disease in male family member before age 2     Review of Systems Sharyn Lull R. Brooks CMA; 08/04/2014 11:33 AM) General Not Present- Appetite Loss, Chills, Fatigue, Fever, Night Sweats, Weight Gain and Weight Loss. Skin Not Present- Change in Wart/Mole, Dryness, Hives, Jaundice, New Lesions, Non-Healing Wounds, Rash and Ulcer. HEENT Present- Hearing Loss and Wears glasses/contact lenses. Not Present- Earache, Hoarseness, Nose Bleed, Oral Ulcers, Ringing in the Ears, Seasonal Allergies, Sinus Pain, Sore Throat, Visual Disturbances and Yellow Eyes. Respiratory Not Present- Bloody sputum, Chronic Cough, Difficulty Breathing, Snoring and Wheezing. Breast Not Present- Breast Mass, Breast Pain, Nipple Discharge and Skin Changes. Cardiovascular Not Present- Chest Pain, Difficulty Breathing Lying Down, Leg Cramps, Palpitations, Rapid Heart Rate, Shortness of Breath and Swelling of Extremities. Gastrointestinal Present- Abdominal Pain. Not Present- Bloating, Bloody Stool, Change in Bowel Habits, Chronic diarrhea, Constipation, Difficulty Swallowing, Excessive gas, Gets full quickly at meals, Hemorrhoids,  Indigestion, Nausea, Rectal Pain and Vomiting. Male Genitourinary Present- Impotence. Not Present- Blood in Urine, Change in Urinary Stream, Frequency, Nocturia, Painful Urination, Urgency and Urine Leakage. Musculoskeletal Not Present- Back Pain, Joint Pain, Joint Stiffness, Muscle Pain, Muscle Weakness and Swelling of Extremities. Neurological  Not Present- Decreased Memory, Fainting, Headaches, Numbness, Seizures, Tingling, Tremor, Trouble walking and Weakness. Psychiatric Not Present- Anxiety, Bipolar, Change in Sleep Pattern, Depression, Fearful and Frequent crying. Endocrine Not Present- Cold Intolerance, Excessive Hunger, Hair Changes, Heat Intolerance, Hot flashes and New Diabetes. Hematology Not Present- Easy Bruising, Excessive bleeding, Gland problems, HIV and Persistent Infections.  Vitals [employer] R. Brooks CMA; 08/04/2014 11:32 AM) 08/04/2014 11:31 AM Weight: 174.13 lb Height: 69in Body Surface Area: 1.96 m Body Mass Index: 25.71 kg/m Pulse: 68 (Regular)  BP: 130/70 (Sitting, Left Arm, Standard)     Physical Exam Jason Noble; 08/04/2014 1:07 PM)  The physical exam findings are as follows: Note:General: WDWN in NAD. Pleasant and cooperative.  HEENT: Bilateral hearing aids present  EYES: Wears glasses.  CV: RRR, no murmur, no JVD.  CHEST: Breath sounds equal and clear. Respirations nonlabored.  ABDOMEN: Soft, multiple scars present  ANORECTAL: No fissures. Normal sphincter tone. No masses.  GU: Reducible right inguinal bulge. No left inguinal bulge.  NEUROLOGIC: Alert and oriented, answers questions appropriately.  PSYCHIATRIC: Normal mood, affect , and behavior.    Assessment & Plan Jason Noble; 08/04/2014 1:09 PM)  HERNIA, INGUINAL, RIGHT (550.90  K40.90) Impression: The hernia symptomatic. We discussed open repair with mesh and he is interested in this. Given his history of cardiac disease, we will discuss this with Dr.  Steva Colder to make sure he is not at increased risk.  Plan: Open right inguinal hernia repair with mesh as long as he is not at increased risk. I have explained the procedure, risks, and aftercare of inguinal hernia repair. Risks include but are not limited to bleeding, infection, wound problems, anesthesia, recurrence, bladder or intestine injury, urinary retention, testicular dysfunction, chronic pain, mesh problems. He seems to understand and would like to proceed.  Jason Noble

## 2014-08-09 DIAGNOSIS — H25012 Cortical age-related cataract, left eye: Secondary | ICD-10-CM | POA: Diagnosis not present

## 2014-08-09 DIAGNOSIS — H2512 Age-related nuclear cataract, left eye: Secondary | ICD-10-CM | POA: Diagnosis not present

## 2014-08-09 DIAGNOSIS — H18411 Arcus senilis, right eye: Secondary | ICD-10-CM | POA: Diagnosis not present

## 2014-08-09 DIAGNOSIS — H25042 Posterior subcapsular polar age-related cataract, left eye: Secondary | ICD-10-CM | POA: Diagnosis not present

## 2014-08-16 ENCOUNTER — Encounter (INDEPENDENT_AMBULATORY_CARE_PROVIDER_SITE_OTHER): Payer: Medicare Other | Admitting: Ophthalmology

## 2014-08-16 DIAGNOSIS — D3132 Benign neoplasm of left choroid: Secondary | ICD-10-CM

## 2014-08-16 DIAGNOSIS — H43813 Vitreous degeneration, bilateral: Secondary | ICD-10-CM | POA: Diagnosis not present

## 2014-08-25 ENCOUNTER — Telehealth (INDEPENDENT_AMBULATORY_CARE_PROVIDER_SITE_OTHER): Payer: Self-pay

## 2014-08-25 NOTE — Telephone Encounter (Signed)
Pt seen by Dr. Zella Richer on 08/04/14 for inguinal hernia.  Calling today to f/u on cardiac clearance from Dr. Mare Ferrari, and schedule surgery if possible.  Pt states Dr. Zella Richer spoke with him.  Please advise.

## 2014-08-26 ENCOUNTER — Telehealth: Payer: Self-pay | Admitting: Cardiology

## 2014-08-26 ENCOUNTER — Ambulatory Visit: Admission: RE | Admit: 2014-08-26 | Payer: Medicare Other | Source: Ambulatory Visit | Admitting: Radiation Oncology

## 2014-08-26 NOTE — Telephone Encounter (Signed)
Left a message with Dr. Sherryl Barters nurse to call and f/u on clearance.

## 2014-08-26 NOTE — Telephone Encounter (Signed)
New message      Pt needs to have hernia surgery---did we get the clearance letter that was faxed to [place]?  They did not receive it back and need to know if pt is cleared.  Please call

## 2014-08-26 NOTE — Telephone Encounter (Signed)
Jason Noble from Dr. Bertrum Sol office called to see if Dr. [name] got the surgical clearance on pt that was faxed to [place]. Pt is having hernia repair. The surgery will be scheduled pending the clearance. Pt was seen by Dr. [name]   on 01/08/14. Jason Noble is aware that this message will be send to [facility] Dr. [name] nurse.

## 2014-08-26 NOTE — Telephone Encounter (Signed)
Note sent to Dr. Mare Ferrari.  Waiting for response.

## 2014-08-27 NOTE — Telephone Encounter (Signed)
Spoke with [facility] and  Dr. Mare Ferrari did receive staff message from Dr Zella Richer, but  Dr. Mare Ferrari out of the office until Monday  Will forward to  Dr. Mare Ferrari for review

## 2014-08-29 NOTE — Telephone Encounter (Signed)
I sent Dr. Zella Richer a note giving cardiology clearance for the inguinal hernia surgery.

## 2014-08-30 ENCOUNTER — Encounter (INDEPENDENT_AMBULATORY_CARE_PROVIDER_SITE_OTHER): Payer: Self-pay | Admitting: General Surgery

## 2014-08-30 NOTE — Telephone Encounter (Signed)
Left message for Jason Noble clearance sent by  Dr. Mare Ferrari to Dr Zella Richer

## 2014-08-30 NOTE — Progress Notes (Signed)
Patient ID: Jason Noble, male   DOB: 11/04/1940, 73 y.o.   MRN: [id number] Dr. Brackbill feels it is okay to proceed with right inguinal hernia repair.  Will work on scheduling the operation. 

## 2014-09-16 NOTE — Patient Instructions (Addendum)
Jason Noble  09/16/2014   Your procedure is scheduled on: Thursday 09/23/14  Report to [facility]  Entrance and follow signs to               [place] at 09:30 AM.  Call this number if you have problems the morning of surgery [phone]   Remember:  Do not eat food or drink liquids :After Midnight.                                 You may not have any metal on your body including hair pins and              piercings  Do not wear jewelry, make-up, lotions, powders or perfumes.             Do not wear nail polish.  Do not shave  48 hours prior to surgery.              Men may shave face and neck.  Do not bring valuables to the hospital. [place].  Contacts, dentures or bridgework may not be worn into surgery.       Patients discharged the day of surgery will not be allowed to drive home.  Name and phone number of your driver:Kathy [phone]  _____________________________________________________________________             [facility] - Preparing for Surgery Before surgery, you can play an important role.  Because skin is not sterile, your skin needs to be as free of germs as possible.  You can reduce the number of germs on your skin by washing with CHG (chlorahexidine gluconate) soap before surgery.  CHG is an antiseptic cleaner which kills germs and bonds with the skin to continue killing germs even after washing. Please DO NOT use if you have an allergy to CHG or antibacterial soaps.  If your skin becomes reddened/irritated stop using the CHG and inform your nurse when you arrive at Short Stay. Do not shave (including legs and underarms) for at least 48 hours prior to the first CHG shower.  You may shave your face/neck. Please follow these instructions carefully:  1.  Shower with CHG Soap the night before surgery and the  morning of Surgery.  2.  If you choose to wash your hair, wash your  hair first as usual with your  normal  shampoo.  3.  After you shampoo, rinse your hair and body thoroughly to remove the  shampoo.                            4.  Use CHG as you would any other liquid soap.  You can apply chg directly  to the skin and wash                       Gently with a scrungie or clean washcloth.  5.  Apply the CHG Soap to your body ONLY FROM THE NECK DOWN.   Do not use on face/ open  Wound or open sores. Avoid contact with eyes, ears mouth and genitals (private parts).                       Wash face,  Genitals (private parts) with your normal soap.             6.  Wash thoroughly, paying special attention to the area where your surgery  will be performed.  7.  Thoroughly rinse your body with warm water from the neck down.  8.  DO NOT shower/wash with your normal soap after using and rinsing off  the CHG Soap.                9.  Pat yourself dry with a clean towel.            10.  Wear clean pajamas.            11.  Place clean sheets on your bed the night of your first shower and do not  sleep with pets. Day of Surgery : Do not apply any lotions/deodorants the morning of surgery.  Please wear clean clothes to the hospital/surgery center.  FAILURE TO FOLLOW THESE INSTRUCTIONS MAY RESULT IN THE CANCELLATION OF YOUR SURGERY PATIENT SIGNATURE_________________________________  NURSE SIGNATURE__________________________________  ________________________________________________________________________

## 2014-09-16 NOTE — Progress Notes (Signed)
EKG 01/08/14 on EPIC

## 2014-09-20 ENCOUNTER — Encounter (HOSPITAL_COMMUNITY): Payer: Self-pay

## 2014-09-20 ENCOUNTER — Encounter (HOSPITAL_COMMUNITY)
Admission: RE | Admit: 2014-09-20 | Discharge: 2014-09-20 | Disposition: A | Discharge status: Home / Self Care | Payer: Medicare Other | Source: Ambulatory Visit | Attending: General Surgery | Admitting: General Surgery

## 2014-09-20 DIAGNOSIS — Z87891 Personal history of nicotine dependence: Secondary | ICD-10-CM | POA: Diagnosis not present

## 2014-09-20 DIAGNOSIS — K4090 Unilateral inguinal hernia, without obstruction or gangrene, not specified as recurrent: Secondary | ICD-10-CM | POA: Diagnosis not present

## 2014-09-20 DIAGNOSIS — I252 Old myocardial infarction: Secondary | ICD-10-CM | POA: Diagnosis not present

## 2014-09-20 DIAGNOSIS — Z85828 Personal history of other malignant neoplasm of skin: Secondary | ICD-10-CM | POA: Diagnosis not present

## 2014-09-20 DIAGNOSIS — Z8546 Personal history of malignant neoplasm of prostate: Secondary | ICD-10-CM | POA: Diagnosis not present

## 2014-09-20 DIAGNOSIS — Z79899 Other long term (current) drug therapy: Secondary | ICD-10-CM | POA: Diagnosis not present

## 2014-09-20 DIAGNOSIS — I739 Peripheral vascular disease, unspecified: Secondary | ICD-10-CM | POA: Diagnosis not present

## 2014-09-20 DIAGNOSIS — E780 Pure hypercholesterolemia: Secondary | ICD-10-CM | POA: Diagnosis not present

## 2014-09-20 DIAGNOSIS — Z8673 Personal history of transient ischemic attack (TIA), and cerebral infarction without residual deficits: Secondary | ICD-10-CM | POA: Diagnosis not present

## 2014-09-20 DIAGNOSIS — I2510 Atherosclerotic heart disease of native coronary artery without angina pectoris: Secondary | ICD-10-CM | POA: Diagnosis not present

## 2014-09-20 DIAGNOSIS — Z7982 Long term (current) use of aspirin: Secondary | ICD-10-CM | POA: Diagnosis not present

## 2014-09-20 DIAGNOSIS — Z85818 Personal history of malignant neoplasm of other sites of lip, oral cavity, and pharynx: Secondary | ICD-10-CM | POA: Diagnosis not present

## 2014-09-20 HISTORY — DX: Personal history of antineoplastic chemotherapy: Z92.21

## 2014-09-20 HISTORY — DX: Patent foramen ovale: Q21.12

## 2014-09-20 HISTORY — DX: Atrial septal defect: Q21.1

## 2014-09-20 HISTORY — DX: Pure hypercholesterolemia, unspecified: E78.00

## 2014-09-20 LAB — CBC WITH DIFFERENTIAL/PLATELET
Basophils Absolute: 0 10*3/uL (ref 0.0–0.1)
Basophils Relative: 0 % (ref 0–1)
Eosinophils Absolute: 0.4 10*3/uL (ref 0.0–0.7)
Eosinophils Relative: 6 % — ABNORMAL HIGH (ref 0–5)
HCT: 44.9 % (ref 39.0–52.0)
Hemoglobin: 14.1 g/dL (ref 13.0–17.0)
Lymphocytes Relative: 21 % (ref 12–46)
Lymphs Abs: 1.2 10*3/uL (ref 0.7–4.0)
MCH: 29 pg (ref 26.0–34.0)
MCHC: 31.4 g/dL (ref 30.0–36.0)
MCV: 92.4 fL (ref 78.0–100.0)
Monocytes Absolute: 0.5 10*3/uL (ref 0.1–1.0)
Monocytes Relative: 9 % (ref 3–12)
Neutro Abs: 3.7 10*3/uL (ref 1.7–7.7)
Neutrophils Relative %: 64 % (ref 43–77)
Platelets: 187 10*3/uL (ref 150–400)
RBC: 4.86 MIL/uL (ref 4.22–5.81)
RDW: 13.4 % (ref 11.5–15.5)
WBC: 5.7 10*3/uL (ref 4.0–10.5)

## 2014-09-20 LAB — COMPREHENSIVE METABOLIC PANEL
ALT: 16 U/L (ref 0–53)
AST: 30 U/L (ref 0–37)
Albumin: 3.9 g/dL (ref 3.5–5.2)
Alkaline Phosphatase: 69 U/L (ref 39–117)
Anion gap: 9 (ref 5–15)
BUN: 21 mg/dL (ref 6–23)
CO2: 26 mmol/L (ref 19–32)
Calcium: 9.4 mg/dL (ref 8.4–10.5)
Chloride: 102 mEq/L (ref 96–112)
Creatinine, Ser: 0.94 mg/dL (ref 0.50–1.35)
GFR calc Af Amer: >90 mL/min (ref >90)
GFR calc non Af Amer: 81 mL/min — ABNORMAL LOW (ref >90)
Glucose, Bld: 108 mg/dL — ABNORMAL HIGH (ref 70–99)
Potassium: 4.4 mmol/L (ref 3.5–5.1)
Sodium: 137 mmol/L (ref 135–145)
Total Bilirubin: 0.8 mg/dL (ref 0.3–1.2)
Total Protein: 6.9 g/dL (ref 6.0–8.3)

## 2014-09-20 LAB — PROTIME-INR
INR: 1 (ref 0.00–1.49)
Prothrombin Time: 13.3 seconds (ref 11.6–15.2)

## 2014-09-20 NOTE — Progress Notes (Signed)
Pt refuses all x-rays. Chest x-ray needed at presurgical appointment, pt refused.

## 2014-09-23 ENCOUNTER — Ambulatory Visit (HOSPITAL_COMMUNITY): Payer: Medicare Other | Admitting: Certified Registered"

## 2014-09-23 ENCOUNTER — Ambulatory Visit (HOSPITAL_COMMUNITY)
Admission: RE | Admit: 2014-09-23 | Discharge: 2014-09-23 | Disposition: A | Discharge status: Home / Self Care | Payer: Medicare Other | Source: Ambulatory Visit | Attending: General Surgery | Admitting: General Surgery

## 2014-09-23 ENCOUNTER — Encounter (HOSPITAL_COMMUNITY)
Admission: RE | Disposition: A | Discharge status: Home / Self Care | Payer: Self-pay | Source: Ambulatory Visit | Attending: General Surgery

## 2014-09-23 ENCOUNTER — Encounter (HOSPITAL_COMMUNITY): Payer: Self-pay | Admitting: *Deleted

## 2014-09-23 DIAGNOSIS — I252 Old myocardial infarction: Secondary | ICD-10-CM | POA: Diagnosis not present

## 2014-09-23 DIAGNOSIS — Z87891 Personal history of nicotine dependence: Secondary | ICD-10-CM | POA: Diagnosis not present

## 2014-09-23 DIAGNOSIS — Z85828 Personal history of other malignant neoplasm of skin: Secondary | ICD-10-CM | POA: Insufficient documentation

## 2014-09-23 DIAGNOSIS — I739 Peripheral vascular disease, unspecified: Secondary | ICD-10-CM | POA: Diagnosis not present

## 2014-09-23 DIAGNOSIS — Z7982 Long term (current) use of aspirin: Secondary | ICD-10-CM | POA: Insufficient documentation

## 2014-09-23 DIAGNOSIS — I2510 Atherosclerotic heart disease of native coronary artery without angina pectoris: Secondary | ICD-10-CM | POA: Insufficient documentation

## 2014-09-23 DIAGNOSIS — K4090 Unilateral inguinal hernia, without obstruction or gangrene, not specified as recurrent: Secondary | ICD-10-CM | POA: Insufficient documentation

## 2014-09-23 DIAGNOSIS — Z8673 Personal history of transient ischemic attack (TIA), and cerebral infarction without residual deficits: Secondary | ICD-10-CM | POA: Insufficient documentation

## 2014-09-23 DIAGNOSIS — E780 Pure hypercholesterolemia: Secondary | ICD-10-CM | POA: Insufficient documentation

## 2014-09-23 DIAGNOSIS — Z8546 Personal history of malignant neoplasm of prostate: Secondary | ICD-10-CM | POA: Insufficient documentation

## 2014-09-23 DIAGNOSIS — Z79899 Other long term (current) drug therapy: Secondary | ICD-10-CM | POA: Insufficient documentation

## 2014-09-23 DIAGNOSIS — Z85818 Personal history of malignant neoplasm of other sites of lip, oral cavity, and pharynx: Secondary | ICD-10-CM | POA: Insufficient documentation

## 2014-09-23 HISTORY — PX: INGUINAL HERNIA REPAIR: SHX194

## 2014-09-23 SURGERY — REPAIR, HERNIA, INGUINAL, ADULT
Anesthesia: General | Site: Groin | Laterality: Right

## 2014-09-23 MED ORDER — GLYCOPYRROLATE 0.2 MG/ML IJ SOLN
INTRAMUSCULAR | Status: DC | PRN
  Administered 2014-09-23: 0.2 mg via INTRAVENOUS

## 2014-09-23 MED ORDER — 0.9 % SODIUM CHLORIDE (POUR BTL) OPTIME
TOPICAL | Status: DC | PRN
  Administered 2014-09-23: 1000 mL

## 2014-09-23 MED ORDER — BUPIVACAINE-EPINEPHRINE 0.5% -1:200000 IJ SOLN
INTRAMUSCULAR | Status: DC | PRN
  Administered 2014-09-23: 10 mL

## 2014-09-23 MED ORDER — ACETAMINOPHEN 325 MG PO TABS: 650.0000 mg | ORAL_TABLET | ORAL | Status: DC | PRN

## 2014-09-23 MED ORDER — LIDOCAINE HCL (CARDIAC) 20 MG/ML IV SOLN
INTRAVENOUS | Status: DC | PRN
  Administered 2014-09-23: 40 mg via INTRAVENOUS

## 2014-09-23 MED ORDER — FENTANYL CITRATE 0.05 MG/ML IJ SOLN
INTRAMUSCULAR | Status: AC
  Filled 2014-09-23: qty 2

## 2014-09-23 MED ORDER — DEXAMETHASONE SODIUM PHOSPHATE 10 MG/ML IJ SOLN
INTRAMUSCULAR | Status: DC | PRN
  Administered 2014-09-23: 10 mg via INTRAVENOUS

## 2014-09-23 MED ORDER — CEFAZOLIN SODIUM-DEXTROSE 2-3 GM-% IV SOLR
INTRAVENOUS | Status: AC
  Filled 2014-09-23: qty 50

## 2014-09-23 MED ORDER — LACTATED RINGERS IV SOLN
INTRAVENOUS | Status: DC
  Administered 2014-09-23: 1000 mL via INTRAVENOUS

## 2014-09-23 MED ORDER — ONDANSETRON HCL 4 MG/2ML IJ SOLN: 4.0000 mg | Freq: Once | INTRAMUSCULAR | Status: DC | PRN

## 2014-09-23 MED ORDER — FENTANYL CITRATE 0.05 MG/ML IJ SOLN: 25.0000 ug | INTRAMUSCULAR | Status: DC | PRN

## 2014-09-23 MED ORDER — GLYCOPYRROLATE 0.2 MG/ML IJ SOLN
INTRAMUSCULAR | Status: AC
  Filled 2014-09-23: qty 1

## 2014-09-23 MED ORDER — DEXAMETHASONE SODIUM PHOSPHATE 10 MG/ML IJ SOLN
INTRAMUSCULAR | Status: AC
  Filled 2014-09-23: qty 1

## 2014-09-23 MED ORDER — PROPOFOL 10 MG/ML IV BOLUS
INTRAVENOUS | Status: AC
  Filled 2014-09-23: qty 20

## 2014-09-23 MED ORDER — CEFAZOLIN SODIUM-DEXTROSE 2-3 GM-% IV SOLR
2.0000 g | INTRAVENOUS | Status: AC
  Administered 2014-09-23: 2 g via INTRAVENOUS

## 2014-09-23 MED ORDER — ONDANSETRON HCL 4 MG/2ML IJ SOLN
INTRAMUSCULAR | Status: AC
  Filled 2014-09-23: qty 2

## 2014-09-23 MED ORDER — OXYCODONE HCL 5 MG PO TABS: 5.0000 mg | ORAL_TABLET | ORAL | Status: DC | PRN

## 2014-09-23 MED ORDER — PROPOFOL 10 MG/ML IV BOLUS
INTRAVENOUS | Status: DC | PRN
  Administered 2014-09-23: 150 mg via INTRAVENOUS

## 2014-09-23 MED ORDER — FENTANYL CITRATE 0.05 MG/ML IJ SOLN
INTRAMUSCULAR | Status: DC | PRN
  Administered 2014-09-23: 50 ug via INTRAVENOUS

## 2014-09-23 MED ORDER — SODIUM CHLORIDE 0.9 % IJ SOLN
3.0000 mL | INTRAMUSCULAR | Status: DC | PRN
Start: 2014-09-23 — End: 2014-09-23

## 2014-09-23 MED ORDER — MORPHINE SULFATE 10 MG/ML IJ SOLN: 2.0000 mg | INTRAMUSCULAR | Status: DC | PRN

## 2014-09-23 MED ORDER — ONDANSETRON HCL 4 MG/2ML IJ SOLN
INTRAMUSCULAR | Status: DC | PRN
  Administered 2014-09-23: 4 mg via INTRAVENOUS

## 2014-09-23 MED ORDER — ACETAMINOPHEN 650 MG RE SUPP
650.0000 mg | RECTAL | Status: DC | PRN
  Filled 2014-09-23: qty 1

## 2014-09-23 MED ORDER — BUPIVACAINE-EPINEPHRINE (PF) 0.5% -1:200000 IJ SOLN
INTRAMUSCULAR | Status: AC
  Filled 2014-09-23: qty 30

## 2014-09-23 MED ORDER — EPHEDRINE SULFATE 50 MG/ML IJ SOLN
INTRAMUSCULAR | Status: DC | PRN
  Administered 2014-09-23: 10 mg via INTRAVENOUS

## 2014-09-23 SURGICAL SUPPLY — 40 items
APL SKNCLS STERI-STRIP NONHPOA (GAUZE/BANDAGES/DRESSINGS) ×1
BENZOIN TINCTURE PRP APPL 2/3 (GAUZE/BANDAGES/DRESSINGS) ×2 IMPLANT
BLADE HEX COATED 2.75 (ELECTRODE) ×2 IMPLANT
BLADE SURG 15 STRL LF DISP TIS (BLADE) ×1 IMPLANT
BLADE SURG 15 STRL SS (BLADE) ×2
CATH KIT ON Q 2.5IN SLV (PAIN MANAGEMENT) IMPLANT
CLOSURE STERI-STRIP 1/4X4 (GAUZE/BANDAGES/DRESSINGS) ×1 IMPLANT
DECANTER SPIKE VIAL GLASS SM (MISCELLANEOUS) ×2 IMPLANT
DRAIN PENROSE 18X1/2 LTX STRL (DRAIN) ×2 IMPLANT
DRAPE INCISE IOBAN 66X45 STRL (DRAPES) ×1 IMPLANT
DRAPE LAPAROTOMY TRNSV 102X78 (DRAPE) ×2 IMPLANT
DRSG TEGADERM 4X4.75 (GAUZE/BANDAGES/DRESSINGS) ×1 IMPLANT
ELECT REM PT RETURN 9FT ADLT (ELECTROSURGICAL) ×2
ELECTRODE REM PT RTRN 9FT ADLT (ELECTROSURGICAL) ×1 IMPLANT
GAUZE SPONGE 4X4 12PLY STRL (GAUZE/BANDAGES/DRESSINGS) ×1 IMPLANT
GLOVE ECLIPSE 8.0 STRL XLNG CF (GLOVE) ×2 IMPLANT
GLOVE INDICATOR 8.0 STRL GRN (GLOVE) ×2 IMPLANT
GOWN STRL REUS W/TWL XL LVL3 (GOWN DISPOSABLE) ×4 IMPLANT
KIT BASIN OR (CUSTOM PROCEDURE TRAY) ×2 IMPLANT
MESH HERNIA 3X6 (Mesh General) ×1 IMPLANT
NDL HYPO 25X1 1.5 SAFETY (NEEDLE) ×1 IMPLANT
NEEDLE HYPO 25X1 1.5 SAFETY (NEEDLE) ×2 IMPLANT
NS IRRIG 1000ML POUR BTL (IV SOLUTION) ×2 IMPLANT
PACK BASIC VI WITH GOWN DISP (CUSTOM PROCEDURE TRAY) ×2 IMPLANT
PENCIL BUTTON HOLSTER BLD 10FT (ELECTRODE) ×2 IMPLANT
PUMP ON Q 100MLX2ML/HR (PAIN MANAGEMENT) IMPLANT
SPONGE LAP 4X18 X RAY DECT (DISPOSABLE) ×2 IMPLANT
STRIP CLOSURE SKIN 1/2X4 (GAUZE/BANDAGES/DRESSINGS) ×2 IMPLANT
SUT MNCRL AB 4-0 PS2 18 (SUTURE) ×2 IMPLANT
SUT PROLENE 2 0 CT2 30 (SUTURE) ×4 IMPLANT
SUT VIC AB 2-0 SH 18 (SUTURE) ×2 IMPLANT
SUT VIC AB 3-0 54XBRD REEL (SUTURE) ×1 IMPLANT
SUT VIC AB 3-0 BRD 54 (SUTURE) ×2
SUT VIC AB 3-0 SH 27 (SUTURE) ×4
SUT VIC AB 3-0 SH 27XBRD (SUTURE) ×1 IMPLANT
SYR 20CC LL (SYRINGE) ×2 IMPLANT
SYR BULB IRRIGATION 50ML (SYRINGE) ×2 IMPLANT
TOWEL OR 17X26 10 PK STRL BLUE (TOWEL DISPOSABLE) ×2 IMPLANT
TOWEL OR NON WOVEN STRL DISP B (DISPOSABLE) ×2 IMPLANT
YANKAUER SUCT BULB TIP 10FT TU (MISCELLANEOUS) ×2 IMPLANT

## 2014-09-23 NOTE — Discharge Instructions (Signed)
[facility] _______[facility] ° °UMBILICAL OR INGUINAL HERNIA REPAIR: POST OP INSTRUCTIONS ° °Always review your discharge instruction sheet given to you by the facility where your surgery was performed. °IF YOU HAVE DISABILITY OR FAMILY LEAVE FORMS, YOU MUST BRING THEM TO THE OFFICE FOR PROCESSING.   °DO NOT GIVE THEM TO YOUR DOCTOR. ° °1. A  prescription for pain medication may be given to you upon discharge.  Take your pain medication as prescribed, if needed.  If narcotic pain medicine is not needed, then you may take acetaminophen (Tylenol) or ibuprofen (Advil) as needed. °2. Take your usually prescribed medications unless otherwise directed. °3. If you need a refill on your pain medication, please contact your pharmacy.  They will contact our office to request authorization. Prescriptions will not be filled after 5 pm or on week-ends. °4. You should follow a light diet the first 24 hours after arrival home, such as soup and crackers, etc.  Be sure to include lots of fluids daily.  Resume your normal diet the day after surgery. °5. Most patients will experience some swelling and bruising around the umbilicus or in the groin and scrotum.  Ice packs and reclining will help.  Swelling and bruising can take several days to resolve.  °6. It is common to experience some constipation if taking pain medication after surgery.  Increasing fluid intake and taking a stool softener (such as Colace) will usually help or prevent this problem from occurring.  A mild laxative (Milk of Magnesia or Miralax) should be taken according to package directions if there are no bowel movements after 48 hours. °7. Unless discharge instructions indicate otherwise, you may remove your bandages 72 hours after surgery, and you may shower at that time.  You may have steri-strips (small skin tapes) in place directly over the incision.  These strips should be left on the skin.  If your surgeon used skin glue on the incision, you may  shower in 24 hours.  The glue will flake off over the next 2-3 weeks.  Any sutures or staples will be removed at the office during your follow-up visit. °8. ACTIVITIES:  You may resume regular (light) daily activities beginning the next day--such as daily self-care, walking, climbing stairs--gradually increasing activities as tolerated.  You may have sexual intercourse when it is comfortable.  Refrain from any heavy lifting or straining-nothing over 10 pounds for 6 weeks. °a. You may drive when you are no longer taking prescription pain medication, you can comfortably wear a seatbelt, and you can safely maneuver your car and apply brakes. °b. RETURN TO WORK:  __________________________________________________________ °9. You should see your doctor in the office for a follow-up appointment approximately 2-3 weeks after your surgery.  Make sure that you call for this appointment within a day or two after you arrive home to insure a convenient appointment time. °10. OTHER INSTRUCTIONS:  __________________________________________________________________________________________________________________________________________________________________________________________  °WHEN TO CALL YOUR DOCTOR: °1. Fever over 101.0 °2. Inability to urinate °3. Nausea and/or vomiting °4. Extreme swelling or bruising °5. Continued bleeding from incision. °6. Increased pain, redness, or drainage from the incision ° °The clinic staff is available to answer your questions during regular business hours.  Please don’t hesitate to call and ask to speak to one of the nurses for clinical concerns.  If you have a medical emergency, go to the nearest emergency room or call 911.  A surgeon from [facility] is always on call at the hospital ° ° °[address], [address], [place], [place]  [address] ? ° [address] [address], [place], [place]   [address] °[phone] ? [phone] ? FAX [phone] °Web site:  [email or web address] °

## 2014-09-23 NOTE — H&P (Signed)
Jason Noble is an 74 y.o. male.   Chief Complaint:   Here for elective right inguinal hernia repair  HPI:   He began having pain in the right groin area and sometimes it was significant. He noticed that when he put pressure on the area the pain improved. He does not see a visible bulge. He saw Dr. Dema Severin and she found a right inguinal hernia.  He now presents for repair.  Past Medical History  Diagnosis Date  . Cholecystitis   . Abscess of gallbladder   . CVA (cerebral infarction) 2007    "mild"  . 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 Skin cancer     basal cell, squamous cell  . Prostate cancer 2002  . Basal cell carcinoma of skin   . Squamous cell carcinoma of skin   . Coronary heart disease     has a stent  . History of radiation therapy 06/18/2011- 08/03/2011    squamous cell tongue/69.96 Gy 18fx  . Fatigue 07/26/2011  . Myocardial infarction   . PFO (patent foramen ovale)   . Hypercholesteremia     under control  . History of cancer chemotherapy     Past Surgical History  Procedure Laterality Date  . Prostatectomy  2002  . Peg tube placement  06/13/11 at [facility] IR    10/2011 removed  . Multiple tooth extractions      prior to radiation  . 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 Elbow surgery  42 years ago    removal left radial head due to hockey accident  . Cholecystectomy  04/2009    3 surgeries involved: lap chole x2 laparotomy  . Exploratory laparotomy  04/2010    Dr. Redmond Pulling (3rd surgery for gall bladder)  . Tonsillectomy      x2: as child then 1985 for Left lingual tonsil removed    Family History  Problem Relation Age of Onset  . Stroke Mother   . Coronary artery disease Brother     had bypass   Social History:  reports that he quit smoking about 48 years ago. His smoking use included Cigarettes. He smoked 0.00 packs per day. He has never used smokeless tobacco. He reports that he does not drink alcohol or use illicit drugs.  Allergies:  No Known Allergies  Medications Prior to Admission  Medication Sig Dispense Refill  . aspirin EC 325 MG EC tablet Take 325 mg by mouth every morning.     Marland Kitchen atorvastatin (LIPITOR) 40 MG tablet Take 40 mg by mouth daily. Every other night    . calcium carbonate (OS-CAL) 600 MG TABS Take 1,200 mg by mouth every evening.     . cholecalciferol (VITAMIN D) 1000 UNITS tablet Take 1,000 Units by mouth daily.    . fish oil-omega-3 fatty acids 1000 MG capsule Take 1 g by mouth daily.     . L-Glutamine 500 MG TABS Take 1 tablet by mouth every evening.    . Lecithin 1200 MG CAPS Take 1 capsule by mouth daily.    . Misc Natural Products (LUTEIN 20 PO) Take 1 tablet by mouth daily.    . Misc Natural Products (OSTEO BI-FLEX JOINT SHIELD PO) Take 1 tablet by mouth every evening.    . Multiple Vitamin (MULTIVITAMIN WITH MINERALS) TABS tablet Take 1 tablet by mouth daily.    . Multiple Vitamin (MULTIVITAMIN) tablet Take 1 tablet by mouth daily.    Marland Kitchen  niacinamide 500 MG tablet Take 500 mg by mouth every evening.    . pyridOXINE (VITAMIN B-6) 50 MG tablet Take 50 mg by mouth daily.    . Sodium Fluoride 1.1 % PSTE Apply thin ribbon of cream to toothbrush. Brush teeth for 2 minutes at bedtime. Spit out excess. Do not swallow. Either this or fluorishield    . tretinoin (RETIN-A) 0.025 % cream Apply 1 application topically at bedtime. As Directed    . Turmeric 450 MG CAPS Take 1 capsule by mouth daily.    Marland Kitchen UBIQUINOL PO Take 100 mg by mouth daily.     Marland Kitchen VITAMIN A PO Take 1 tablet by mouth daily.    . vitamin C (ASCORBIC ACID) 500 MG tablet Take 500 mg by mouth daily.    . vitamin E 400 UNIT capsule Take 400 Units by mouth every morning.     . zinc gluconate 50 MG tablet Take 25 mg by mouth daily.    Marland Kitchen atorvastatin (LIPITOR) 80 MG tablet Take 1 tablet (80 mg total) by mouth daily. (Patient taking differently: Take 40 mg by mouth every other day. In the p.m) 90 tablet 3    No results found for this or any previous  visit (from the past 48 hour(s)). No results found.  Review of Systems  Gastrointestinal: Positive for constipation.    Blood pressure 161/93, pulse 68, temperature 98 F (36.7 C), temperature source Oral, resp. rate 20, height 5\' 9"  (1.753 m), weight 176 lb (79.833 kg), SpO2 100 %. Physical Exam  Constitutional: He appears well-developed and well-nourished. No distress.    ABDOMEN: Soft, multiple scars present  ANORECTAL: No fissures. Normal sphincter tone. No masses.  GU: Reducible right inguinal bulge. No left inguinal bulge.  NEUROLOGIC: Alert and oriented, answers questions appropriately.    Assessment/Plan Symptomatic right inguinal hernia  Plan:  Right inguinal hernia repair with mesh    ROSENBOWER,TODD J 09/23/2014, 11:15 AM

## 2014-09-23 NOTE — H&P (View-Only) (Signed)
Patient ID: Jason Noble, male   DOB: 05-15-1941, 74 y.o.   MRN: [id number] Dr. Mare Ferrari feels it is okay to proceed with right inguinal hernia repair.  Will work on scheduling the operation.

## 2014-09-23 NOTE — Anesthesia Postprocedure Evaluation (Signed)
  Anesthesia Post-op Note  Patient: Jason Noble  Procedure(s) Performed: Procedure(s) (LRB): RIGHT INGUINAL HERNIA REPAIR WITH MESH (Right)  Patient Location: PACU  Anesthesia Type: General  Level of Consciousness: awake and alert   Airway and Oxygen Therapy: Patient Spontanous Breathing  Post-op Pain: mild  Post-op Assessment: Post-op Vital signs reviewed, Patient's Cardiovascular Status Stable, Respiratory Function Stable, Patent Airway and No signs of Nausea or vomiting  Last Vitals:  Filed Vitals:   09/23/14 1300  BP: 137/79  Pulse: 84  Temp:   Resp: 14    Post-op Vital Signs: stable   Complications: No apparent anesthesia complications

## 2014-09-23 NOTE — Interval H&P Note (Signed)
History and Physical Interval Note:  09/23/2014 11:14 AM  Jason Noble  has presented today for surgery, with the diagnosis of Right Inguinal Hernia  The various methods of treatment have been discussed with the patient and family. After consideration of risks, benefits and other options for treatment, the patient has consented to  Procedure(s): RIGHT INGUINAL HERNIA REPAIR WITH MESH (Right) as a surgical intervention .  The patient's history has been reviewed, patient examined, no change in status, stable for surgery.  I have reviewed the patient's chart and labs.  Questions were answered to the patient's satisfaction.     ROSENBOWER,TODD Lenna Sciara

## 2014-09-23 NOTE — Anesthesia Preprocedure Evaluation (Signed)
Anesthesia Evaluation  Patient identified by MRN, date of birth, ID band Patient awake    Reviewed: Allergy & Precautions, NPO status , Patient's Chart, lab work & pertinent test results  History of Anesthesia Complications Negative for: history of anesthetic complications  Airway Mallampati: II  TM Distance: >3 FB Neck ROM: Full  Mouth opening: Limited Mouth Opening  Dental no notable dental hx. (+) Dental Advisory Given   Pulmonary former smoker,  breath sounds clear to auscultation  Pulmonary exam normal       Cardiovascular + CAD, + Past MI, + CABG and + Peripheral Vascular Disease Rhythm:Regular Rate:Normal  Please report. The echo is satisfactory. The aortic root is 4.4 and the ascending aorta is 4.5 which is similar to previous study. The left ventricular ejection fraction is satisfactory at 50-55%.CSD  Clearance for surgery given by cardiology, see chart  Hx of TIA   Neuro/Psych CVA, No Residual Symptoms negative psych ROS   GI/Hepatic negative GI ROS, Neg liver ROS,   Endo/Other  negative endocrine ROS  Renal/GU negative Renal ROS  negative genitourinary   Musculoskeletal negative musculoskeletal ROS (+)   Abdominal   Peds negative pediatric ROS (+)  Hematology negative hematology ROS (+)   Anesthesia Other Findings   Reproductive/Obstetrics negative OB ROS                             Anesthesia Physical Anesthesia Plan  ASA: III  Anesthesia Plan: General   Post-op Pain Management:    Induction: Intravenous  Airway Management Planned:   Additional Equipment:   Intra-op Plan:   Post-operative Plan: Extubation in OR  Informed Consent: I have reviewed the patients History and Physical, chart, labs and discussed the procedure including the risks, benefits and alternatives for the proposed anesthesia with the patient or authorized representative who has indicated  his/her understanding and acceptance.   Dental advisory given  Plan Discussed with: CRNA  Anesthesia Plan Comments:         Anesthesia Quick Evaluation

## 2014-09-23 NOTE — Op Note (Addendum)
Preoperative diagnosis:  Right inguinal hernia.  Postoperative diagnosis:  Same (indirect)  Procedure:  Right inguinal hernia repair with mesh.  Surgeon:  Jackolyn Confer, M.D.  Anesthesia:  General/LMA with local (Marcaine).  Indication:  This is a 74 year old male with a symptomatic right inguinal hernia who presents for repair.  Technique:  He was seen in the holding room and the right groin was marked with my initials. He was brought to the operating, placed supine on the operating table, and the anesthetic was administered by the anesthesiologist. The hair in the groin area was clipped as was felt to be necessary. This area was then sterilely prepped and draped.  Local anesthetic was infiltrated in the superficial and deep tissues in the right groin.  An incision was made through the skin and subcutaneous tissue until the external oblique aponeurosis was identified.  Local anesthetic was infiltrated deep to the external oblique aponeurosis. The external oblique aponeurosis was divided through the external ring medially and back toward the anterior superior iliac spine laterally. Using blunt dissection, the shelving edge of the inguinal ligament was identified inferiorly and the internal oblique aponeurosis and muscle were identified superiorly. The ilioinguinal nerve was identified and preserved.  The spermatic cord was isolated and a posterior window was made around it. An indirect hernia sac was identified and separated from the spermatic cord using blunt dissection. The hernia sac and its contents were reduced through the indirect hernia defect.   A piece of 3" x 6" polypropylene mesh was brought into the field and anchored 1-2 cm medial to the pubic tubercle with 2-0 Prolene suture. The inferior aspect of the mesh was anchored to the shelving edge of the inguinal ligament with running 2-0 Prolene suture to a level 1-2 cm lateral to the internal ring. A slit was cut in the mesh creating 2  tails. These were wrapped around the spermatic cord. The superior aspect of the mesh was anchored to the internal oblique aponeurosis and muscle with interrupted 2-0 Vicryl sutures. The 2 tails of the mesh were then crossed creating a new internal ring and were anchored to the shelving edge of the inguinal ligament with 2-0 Prolene suture. The tip of a hemostat could be placed through the new aperture. The lateral aspect of the mesh was then tucked deep to the external oblique aponeurosis.  The wound was inspected and hemostasis was adequate. The external oblique aponeurosis was then closed over the mesh and cord with running 3-0 Vicryl suture. The subcutaneous tissue was closed with running 3-0 Vicryl suture. The skin closed with a running 4-0 Monocryl subcuticular stitch.  Steri-Strips and a sterile dressing were applied.  The procedure was well-tolerated without any apparent complications and he was taken to the recovery room in satisfactory condition.

## 2014-09-23 NOTE — Transfer of Care (Signed)
Immediate Anesthesia Transfer of Care Note  Patient: Jason Noble  Procedure(s) Performed: Procedure(s) (LRB): RIGHT INGUINAL HERNIA REPAIR WITH MESH (Right)  Patient Location: PACU  Anesthesia Type: General  Level of Consciousness: sedated, patient cooperative and responds to stimulation  Airway & Oxygen Therapy: Patient Spontanous Breathing and Patient connected to face mask oxgen  Post-op Assessment: Report given to PACU RN and Post -op Vital signs reviewed and stable  Post vital signs: Reviewed and stable  Complications: No apparent anesthesia complications

## 2014-09-23 NOTE — Anesthesia Procedure Notes (Signed)
Procedure Name: LMA Insertion Date/Time: 09/23/2014 11:37 AM Performed by: Lajuana Carry E Pre-anesthesia Checklist: Patient identified, Emergency Drugs available, Suction available and Patient being monitored Patient Re-evaluated:Patient Re-evaluated prior to inductionOxygen Delivery Method: Circle system utilized Preoxygenation: Pre-oxygenation with 100% oxygen Intubation Type: IV induction Ventilation: Mask ventilation without difficulty LMA: LMA inserted LMA Size: 4.0 Number of attempts: 1 Placement Confirmation: positive ETCO2 and breath sounds checked- equal and bilateral Comments: Teeth/lips as pre op, no blood noted in oropharynx

## 2014-09-23 NOTE — Interval H&P Note (Signed)
History and Physical Interval Note:  09/23/2014 11:26 AM  Jason Noble  has presented today for surgery, with the diagnosis of Right Inguinal Hernia  The various methods of treatment have been discussed with the patient and family. After consideration of risks, benefits and other options for treatment, the patient has consented to  Procedure(s): RIGHT INGUINAL HERNIA REPAIR WITH MESH (Right) as a surgical intervention .  The patient's history has been reviewed, patient examined, no change in status, stable for surgery.  I have reviewed the patient's chart and labs.  Questions were answered to the patient's satisfaction.     ROSENBOWER,TODD Lenna Sciara

## 2014-09-24 ENCOUNTER — Encounter (HOSPITAL_COMMUNITY): Payer: Self-pay | Admitting: General Surgery

## 2014-09-30 ENCOUNTER — Encounter: Payer: Self-pay | Admitting: Radiation Oncology

## 2014-09-30 ENCOUNTER — Ambulatory Visit
Admission: RE | Admit: 2014-09-30 | Discharge: 2014-09-30 | Disposition: A | Discharge status: Home / Self Care | Payer: Medicare Other | Source: Ambulatory Visit | Attending: Radiation Oncology | Admitting: Radiation Oncology

## 2014-09-30 VITALS — BP 147/95 | HR 62 | Temp 98.2°F | Resp 12 | Wt 179.2 lb

## 2014-09-30 DIAGNOSIS — C01 Malignant neoplasm of base of tongue: Secondary | ICD-10-CM | POA: Diagnosis not present

## 2014-09-30 MED ORDER — LARYNGOSCOPY SOLUTION RAD-ONC
15.0000 mL | Freq: Once | TOPICAL | Status: DC
  Filled 2014-09-30: qty 15

## 2014-09-30 MED ORDER — LARYNGOSCOPY SOLUTION RAD-ONC
15.0000 mL | Freq: Once | TOPICAL | Status: AC
  Administered 2014-09-30: 15 mL via TOPICAL

## 2014-09-30 NOTE — Progress Notes (Signed)
He is currently in no pain. Pt complains of dry mouth. Reports water and gum help significantly. Hernia surgery January 7th.  Recovery well, with little complaints.  Pt denies dysphagia. Pt reports a regular unmodified diet orally.    Wt Readings from Last 3 Encounters:  09/23/14 176 lb (79.833 kg)  04/27/14 171 lb 9.6 oz (77.837 kg)  01/08/14 175 lb (79.379 kg)

## 2014-10-05 ENCOUNTER — Encounter: Payer: Self-pay | Admitting: Radiation Oncology

## 2014-10-05 NOTE — Addendum Note (Signed)
Encounter addended by: Jodelle Gross, MD on: 10/05/2014  2:15 PM<BR>     Documentation filed: Notes Section, Follow-up Section, LOS Section, Problem List

## 2014-10-05 NOTE — Progress Notes (Signed)
Radiation Oncology         [phone] ________________________________  Name: Jason Noble MRN: [id number]  Date: 09/30/2014  DOB: September 11, 1941  Follow-Up Visit Note  CC: Shirline Frees, MD  Izora Gala, MD  Diagnosis:   Squamous cell carcinoma of the base of tongue  Interval Since Last Radiation:  3 years   Narrative:  The patient returns today for routine follow-up.  The patient states he continues to do fairly well. The patient denies any pain at this time. The patient reports no new complaints today. His weight has increased 5 pounds since he was last seen in August. He denies any dysphasia or odynophagia. He continues to complain of some xerostomia although this has improved. He does not need to carry a water bottle anymore although he does need to drink plenty of fluids with meals. He states that he has some remaining [place] although he has not been using this recently. We discussed possibly trying this one more time.  ALLERGIES:  has No Known Allergies.  Meds: Current Outpatient Prescriptions  Medication Sig Dispense Refill  . aspirin EC 325 MG EC tablet Take 325 mg by mouth every morning.     Marland Kitchen atorvastatin (LIPITOR) 40 MG tablet Take 40 mg by mouth daily. Every other night    . atorvastatin (LIPITOR) 80 MG tablet Take 1 tablet (80 mg total) by mouth daily. (Patient taking differently: Take 40 mg by mouth every other day. In the p.m) 90 tablet 3  . calcium carbonate (OS-CAL) 600 MG TABS Take 1,200 mg by mouth every evening.     . cholecalciferol (VITAMIN D) 1000 UNITS tablet Take 1,000 Units by mouth daily.    . fish oil-omega-3 fatty acids 1000 MG capsule Take 1 g by mouth daily.     . L-Glutamine 500 MG TABS Take 1 tablet by mouth every evening.    . Lecithin 1200 MG CAPS Take 1 capsule by mouth daily.    . Misc Natural Products (LUTEIN 20 PO) Take 1 tablet by mouth daily.    . Misc Natural Products (OSTEO BI-FLEX JOINT SHIELD PO) Take 1 tablet by mouth every  evening.    . Multiple Vitamin (MULTIVITAMIN WITH MINERALS) TABS tablet Take 1 tablet by mouth daily.    . Multiple Vitamin (MULTIVITAMIN) tablet Take 1 tablet by mouth daily.    . niacinamide 500 MG tablet Take 500 mg by mouth every evening.    Marland Kitchen oxyCODONE (OXY IR/ROXICODONE) 5 MG immediate release tablet Take 1-2 tablets (5-10 mg total) by mouth every 4 (four) hours as needed for moderate pain, severe pain or breakthrough pain. 40 tablet 0  . pyridOXINE (VITAMIN B-6) 50 MG tablet Take 50 mg by mouth daily.    . Sodium Fluoride 1.1 % PSTE Apply thin ribbon of cream to toothbrush. Brush teeth for 2 minutes at bedtime. Spit out excess. Do not swallow. Either this or fluorishield    . tretinoin (RETIN-A) 0.025 % cream Apply 1 application topically at bedtime. As Directed    . Turmeric 450 MG CAPS Take 1 capsule by mouth daily.    Marland Kitchen UBIQUINOL PO Take 100 mg by mouth daily.     Marland Kitchen VITAMIN A PO Take 1 tablet by mouth daily.    . vitamin C (ASCORBIC ACID) 500 MG tablet Take 500 mg by mouth daily.    . vitamin E 400 UNIT capsule Take 400 Units by mouth every morning.     . zinc gluconate 50 MG  tablet Take 25 mg by mouth daily.     No current facility-administered medications for this encounter.   Facility-Administered Medications Ordered in Other Encounters  Medication Dose Route Frequency Provider Last Rate Last Dose  . topical emolient (BIAFINE) emulsion   Topical BID Jodelle Gross, MD        Physical Findings: The patient is in no acute distress. Patient is alert and oriented.  weight is 179 lb 3.2 oz (81.285 kg). His oral temperature is 98.2 F (36.8 C). His blood pressure is 147/95 and his pulse is 62. His respiration is 12 and oxygen saturation is 99%. .   Oral cavity clear with no suspicious findings. Some moistness present. No cervical lymphadenopathy present. His skin looks very good post radiation treatment Fiberoptic exam: After the use of topical anesthetic, the flexible laryngoscope  was passed through the right near. Good visualization was obtained. No lesions or suspicious findings within the larynx, hypopharynx, oropharynx or nasopharynx.   Lab Findings: Lab Results  Component Value Date   WBC 5.7 09/20/2014   HGB 14.1 09/20/2014   HCT 44.9 09/20/2014   MCV 92.4 09/20/2014   PLT 187 09/20/2014     Radiographic Findings: No results found.  Impression:    The patient is doing well at this time clinically, he remains clinically NED with some continued improvement in symptoms such as xerostomia. Fiberoptic exam look good today with no concerning findings. Very good visualization was achieved.  Plan:  Patient will recurrent to clinic for ongoing followup in 6 months.   Jodelle Gross, M.D., Ph.D.

## 2014-10-20 ENCOUNTER — Other Ambulatory Visit: Payer: Self-pay | Admitting: Dermatology

## 2014-10-20 DIAGNOSIS — L570 Actinic keratosis: Secondary | ICD-10-CM | POA: Diagnosis not present

## 2014-10-20 DIAGNOSIS — D225 Melanocytic nevi of trunk: Secondary | ICD-10-CM | POA: Diagnosis not present

## 2014-10-20 DIAGNOSIS — D485 Neoplasm of uncertain behavior of skin: Secondary | ICD-10-CM | POA: Diagnosis not present

## 2014-10-20 DIAGNOSIS — L719 Rosacea, unspecified: Secondary | ICD-10-CM | POA: Diagnosis not present

## 2014-10-20 DIAGNOSIS — L905 Scar conditions and fibrosis of skin: Secondary | ICD-10-CM | POA: Diagnosis not present

## 2014-10-20 DIAGNOSIS — Z85828 Personal history of other malignant neoplasm of skin: Secondary | ICD-10-CM | POA: Diagnosis not present

## 2014-10-20 DIAGNOSIS — D0472 Carcinoma in situ of skin of left lower limb, including hip: Secondary | ICD-10-CM | POA: Diagnosis not present

## 2014-10-20 DIAGNOSIS — C44311 Basal cell carcinoma of skin of nose: Secondary | ICD-10-CM | POA: Diagnosis not present

## 2014-11-01 DIAGNOSIS — H25041 Posterior subcapsular polar age-related cataract, right eye: Secondary | ICD-10-CM | POA: Diagnosis not present

## 2014-11-01 DIAGNOSIS — H2511 Age-related nuclear cataract, right eye: Secondary | ICD-10-CM | POA: Diagnosis not present

## 2014-11-01 DIAGNOSIS — H25012 Cortical age-related cataract, left eye: Secondary | ICD-10-CM | POA: Diagnosis not present

## 2014-11-01 DIAGNOSIS — H2512 Age-related nuclear cataract, left eye: Secondary | ICD-10-CM | POA: Diagnosis not present

## 2014-11-01 DIAGNOSIS — H25042 Posterior subcapsular polar age-related cataract, left eye: Secondary | ICD-10-CM | POA: Diagnosis not present

## 2014-11-01 DIAGNOSIS — H25011 Cortical age-related cataract, right eye: Secondary | ICD-10-CM | POA: Diagnosis not present

## 2014-11-08 DIAGNOSIS — M5136 Other intervertebral disc degeneration, lumbar region: Secondary | ICD-10-CM | POA: Diagnosis not present

## 2014-11-08 DIAGNOSIS — M9903 Segmental and somatic dysfunction of lumbar region: Secondary | ICD-10-CM | POA: Diagnosis not present

## 2014-11-10 DIAGNOSIS — M5136 Other intervertebral disc degeneration, lumbar region: Secondary | ICD-10-CM | POA: Diagnosis not present

## 2014-11-10 DIAGNOSIS — M9903 Segmental and somatic dysfunction of lumbar region: Secondary | ICD-10-CM | POA: Diagnosis not present

## 2014-11-17 DIAGNOSIS — M9903 Segmental and somatic dysfunction of lumbar region: Secondary | ICD-10-CM | POA: Diagnosis not present

## 2014-11-17 DIAGNOSIS — M5136 Other intervertebral disc degeneration, lumbar region: Secondary | ICD-10-CM | POA: Diagnosis not present

## 2014-11-23 DIAGNOSIS — M5136 Other intervertebral disc degeneration, lumbar region: Secondary | ICD-10-CM | POA: Diagnosis not present

## 2014-11-23 DIAGNOSIS — M9903 Segmental and somatic dysfunction of lumbar region: Secondary | ICD-10-CM | POA: Diagnosis not present

## 2014-11-30 DIAGNOSIS — M5136 Other intervertebral disc degeneration, lumbar region: Secondary | ICD-10-CM | POA: Diagnosis not present

## 2014-11-30 DIAGNOSIS — M9903 Segmental and somatic dysfunction of lumbar region: Secondary | ICD-10-CM | POA: Diagnosis not present

## 2014-12-07 DIAGNOSIS — M5136 Other intervertebral disc degeneration, lumbar region: Secondary | ICD-10-CM | POA: Diagnosis not present

## 2014-12-07 DIAGNOSIS — M9903 Segmental and somatic dysfunction of lumbar region: Secondary | ICD-10-CM | POA: Diagnosis not present

## 2014-12-15 DIAGNOSIS — M5136 Other intervertebral disc degeneration, lumbar region: Secondary | ICD-10-CM | POA: Diagnosis not present

## 2014-12-15 DIAGNOSIS — M9903 Segmental and somatic dysfunction of lumbar region: Secondary | ICD-10-CM | POA: Diagnosis not present

## 2014-12-21 DIAGNOSIS — M5136 Other intervertebral disc degeneration, lumbar region: Secondary | ICD-10-CM | POA: Diagnosis not present

## 2014-12-21 DIAGNOSIS — M9903 Segmental and somatic dysfunction of lumbar region: Secondary | ICD-10-CM | POA: Diagnosis not present

## 2014-12-28 DIAGNOSIS — M9903 Segmental and somatic dysfunction of lumbar region: Secondary | ICD-10-CM | POA: Diagnosis not present

## 2014-12-28 DIAGNOSIS — M5136 Other intervertebral disc degeneration, lumbar region: Secondary | ICD-10-CM | POA: Diagnosis not present

## 2014-12-28 DIAGNOSIS — M1612 Unilateral primary osteoarthritis, left hip: Secondary | ICD-10-CM | POA: Diagnosis not present

## 2015-01-10 ENCOUNTER — Encounter: Payer: Self-pay | Admitting: Cardiology

## 2015-01-10 ENCOUNTER — Ambulatory Visit (INDEPENDENT_AMBULATORY_CARE_PROVIDER_SITE_OTHER): Payer: Medicare Other | Admitting: Cardiology

## 2015-01-10 VITALS — BP 130/60 | HR 76 | Ht 69.0 in | Wt 176.8 lb

## 2015-01-10 DIAGNOSIS — E7800 Pure hypercholesterolemia, unspecified: Secondary | ICD-10-CM

## 2015-01-10 DIAGNOSIS — I259 Chronic ischemic heart disease, unspecified: Secondary | ICD-10-CM

## 2015-01-10 DIAGNOSIS — I7121 Aneurysm of the ascending aorta, without rupture: Secondary | ICD-10-CM

## 2015-01-10 DIAGNOSIS — I712 Thoracic aortic aneurysm, without rupture: Secondary | ICD-10-CM

## 2015-01-10 DIAGNOSIS — Q211 Atrial septal defect: Secondary | ICD-10-CM | POA: Diagnosis not present

## 2015-01-10 DIAGNOSIS — Q2111 Secundum atrial septal defect: Secondary | ICD-10-CM

## 2015-01-10 NOTE — Progress Notes (Signed)
Cardiology Office Note   Date:  01/10/2015   ID:  Jason Noble, DOB January 02, 1941, MRN [id number]  PCP:  Shirline Frees, MD  Cardiologist:   Darlin Coco, MD   Chief Complaint  Patient presents with  . Follow-up      History of Present Illness: Jason Noble is a 74 y.o. male who presents for a one-year follow-up office visit This pleasant 74 year old gentleman is seen for a one-year followup office visit. He is a former patient of Dr. Lia Foyer. 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 The patient has a complex past medical history. He has known coronary disease and had three-vessel CABG in 1991 by Dr. Servando Snare. In 2001 the patient had prostate cancer treated with retropubic radical prostatectomy. 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 In 2009 the patient had a mild stroke from a tiny PFO. Surgical closure was not recommended. In August 2010 the patient had a laparoscopic cholecystectomy by Dr. Greer Pickerel. There were postoperative complications and the patient underwent a repeat laparoscopic cholecystectomy in November 2010. He continued to have problems and finally in August 2011 underwent an open laparoscopy for residual problems in the gallbladder fossa. Since then he has done well regarding his gastrointestinal tract. 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 Dr. Lisbeth Renshaw is his radiation oncologist and Dr. Lamonte Sakai was his chemotherapy oncologist. He did not require surgery for his cancer of the tongue. The patient has a known dilated ascending aorta which last measured 4.4 x 4.1 cm in maximum dimensions new the aortic root by CT scan done in 2009. The patient has been reluctant to have any additional radiation to his chest area because of  the radiation he has already encountered with his cancer. Therefore he is reluctant to have a CT angiogram of the chest at this time. He had an echocardiogram on 12/4812 which showed an ejection fraction of 50-55% and his aortic root was 4.4 cm and has a sitting aorta was 4.5 cm        Since his last visit he has been feeling well.  He was unable to tolerate 80 mg of Lipitor daily.  His wife noted that his thinking was becoming foggy.  He cut back to just half of an 80 mg tablet 4 days a week and seems to be doing better.  He is requesting a follow-up lipid which we will arrange.  He is not fasting today.  He continues to be physically active.  He is not experiencing any exertional chest pain or angina.  He has not had any awareness of palpitations nor has he had dizziness or syncope.    Past Medical History  Diagnosis Date  . Cholecystitis   . Abscess of gallbladder   . CVA (cerebral infarction) 2007    "mild"  . 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 Skin cancer     basal cell, squamous cell  . Prostate cancer 2002  . Basal cell carcinoma of skin   . Squamous cell carcinoma of skin   . Coronary heart disease     has a stent  . History of radiation therapy 06/18/2011- 08/03/2011    squamous cell tongue/69.96 Gy 16f  . Fatigue 07/26/2011  . Myocardial infarction   .  PFO (patent foramen ovale)   . Hypercholesteremia     under control  . History of cancer chemotherapy     Past Surgical History  Procedure Laterality Date  . Prostatectomy  2002  . Peg tube placement  06/13/11 [facility]    10/2011 removed  . Multiple tooth extractions      prior to radiation  . 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 Elbow surgery  42 years ago    removal left radial head due to hockey accident  . Cholecystectomy  04/2009    3 surgeries involved: lap chole x2 laparotomy  . Exploratory laparotomy  04/2010    Dr. Redmond Pulling (3rd surgery for gall bladder)  . Tonsillectomy      x2: as child  then 1985 for Left lingual tonsil removed  . Inguinal hernia repair Right 09/23/2014    Procedure: RIGHT INGUINAL HERNIA REPAIR WITH MESH;  Surgeon: Jackolyn Confer, MD;  Location: [facility];  Service: General;  Laterality: Right;     Current Outpatient Prescriptions  Medication Sig Dispense Refill  . aspirin EC 325 MG EC tablet Take 325 mg by mouth every morning.     Marland Kitchen atorvastatin (LIPITOR) 40 MG tablet Take 40 mg by mouth daily. Take 1 tablet 4 times a week    . calcium carbonate (OS-CAL) 600 MG TABS Take 1,200 mg by mouth every evening.     . cholecalciferol (VITAMIN D) 1000 UNITS tablet Take 1,000 Units by mouth daily.    . fish oil-omega-3 fatty acids 1000 MG capsule Take 1 g by mouth daily.     . fluorouracil (EFUDEX) 5 % cream Apply 5 Act topically daily as needed.    . L-Glutamine 500 MG TABS Take 1 tablet by mouth every evening.    . Lecithin 1200 MG CAPS Take 1 capsule by mouth daily.    . Misc Natural Products (LUTEIN 20 PO) Take 1 tablet by mouth daily.    . Misc Natural Products (OSTEO BI-FLEX JOINT SHIELD PO) Take 1 tablet by mouth every evening. Pt not sure of dosage.    . Multiple Vitamin (MULTIVITAMIN WITH MINERALS) TABS tablet Take 1 tablet by mouth daily.    . Multiple Vitamin (MULTIVITAMIN) tablet Take 1 tablet by mouth daily.    . niacinamide 500 MG tablet Take 500 mg by mouth every evening.    . pyridOXINE (VITAMIN B-6) 50 MG tablet Take 50 mg by mouth daily.    . Sodium Fluoride 1.1 % PSTE Apply thin ribbon of cream to toothbrush. Brush teeth for 2 minutes at bedtime. Spit out excess. Do not swallow. Either this or fluorishield    . tretinoin (RETIN-A) 0.025 % cream Apply 1 application topically at bedtime. As Directed    . Turmeric 450 MG CAPS Take 1 capsule by mouth daily.    Marland Kitchen UBIQUINOL PO Take 100 mg by mouth daily.     Marland Kitchen VITAMIN A PO Take 1 tablet by mouth daily.    . vitamin C (ASCORBIC ACID) 500 MG tablet Take 500 mg by mouth daily.    . vitamin E 400 UNIT  capsule Take 400 Units by mouth every morning.     . zinc gluconate 50 MG tablet Take 25 mg by mouth daily.     No current facility-administered medications for this visit.   Facility-Administered Medications Ordered in Other Visits  Medication Dose Route Frequency Provider Last Rate Last Dose  . topical emolient (BIAFINE)  emulsion   Topical BID Kyung Rudd, MD        Allergies:   Review of patient's allergies indicates no known allergies.    Social History:  The patient  reports that he quit smoking about 48 years ago. His smoking use included Cigarettes. He has never used smokeless tobacco. He reports that he does not drink alcohol or use illicit drugs.   Family History:  The patient's family history includes Coronary artery disease in his brother; Stroke in his mother.    ROS:  Please see the history of present illness.   Otherwise, review of systems are positive for none.   All other systems are reviewed and negative.    PHYSICAL EXAM: VS:  BP 130/60 mmHg  Pulse 76  Ht _0  (1.753 m)  Wt 176 lb 12.8 oz (80.196 kg)  BMI 26.10 kg/m2 , BMI Body mass index is 26.1 kg/(m^2). GEN: Well nourished, well developed, in no acute distress HEENT: normal Neck: no JVD, carotid bruits, or masses Cardiac: RRR; no murmurs, rubs, or gallops,no edema  Respiratory:  clear to auscultation bilaterally, normal work of breathing GI: soft, nontender, nondistended, + BS MS: no deformity or atrophy Skin: warm and dry, no rash Neuro:  Strength and sensation are intact Psych: euthymic mood, full affect   EKG:  EKG is ordered today. The ekg ordered today demonstrates normal sinus rhythm with occasional PACs and no ischemic changes.   Recent Labs: 04/27/2014: TSH 3.131 09/20/2014: ALT 16; BUN 21; Creatinine 0.94; Hemoglobin 14.1; Platelets 187; Potassium 4.4; Sodium 137    Lipid Panel    Component Value Date/Time   CHOL 149 01/13/2014 1004   TRIG 40.0 01/13/2014 1004   HDL 64.90 01/13/2014 1004    CHOLHDL 2 01/13/2014 1004   VLDL 8.0 01/13/2014 1004   LDLCALC 76 01/13/2014 1004   LDLDIRECT 168.1 12/15/2012 1539      Wt Readings from Last 3 Encounters:  01/10/15 176 lb 12.8 oz (80.196 kg)  09/23/14 176 lb (79.833 kg)  04/27/14 171 lb 9.6 oz (77.837 kg)         ASSESSMENT AND PLAN:  1. Ischemic heart disease status post CABG in 1991 and status post stenting of saphenous vein graft in 2008 with a bare-metal stent, doing well with no recurrent angina. 2. ascending thoracic aortic aneurysm, 3. history of prostate cancer treated with retropubic radical prostatectomy. 4. past history of cancer of the tongue treated with radiotherapy and chemotherapy.  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 5. past history of cholelithiasis with history of retroperitoneal abscess following surgery. 6. hypercholesterolemia, back on statins for the past year 7. known small PFO with past history of small stroke.  On aspirin.  No recurrence. 8. concerned about getting too much irradiation. We are now following his aneurysm with echocardiogram  Plan: Continue current medication. Return next week for fasting lab work. Recheck in one year for office visit EKG lipid panel hepatic function panel and basal metabolic panel.We will now update his echo.   Current medicines are reviewed at length with the patient today.  The patient does not have concerns regarding medicines.  The following changes have been made:  no change  Labs/ tests ordered today include:   Orders Placed This Encounter  Procedures  . EKG 12-Lead  . 2D Echocardiogram without contrast       Signed, Darlin Coco, MD  01/10/2015 6:01 PM  [facility] [address], [place], [place]  [address] Phone: [phone]; Fax: [phone]

## 2015-01-10 NOTE — Patient Instructions (Addendum)
Medication Instructions:  Your physician recommends that you continue on your current medications as directed. Please refer to the Current Medication list given to you today.   Labwork: Lp/bmet/hfp  Testing/Procedures: Your physician has requested that you have an echocardiogram. Echocardiography is a painless test that uses sound waves to create images of your heart. It provides your doctor with information about the size and shape of your heart and how well your heart's chambers and valves are working. This procedure takes approximately one hour. There are no restrictions for this procedure.  Follow-Up: Your physician wants you to follow-up in: 1 year You will receive a reminder letter in the mail two months in advance. If you don't receive a letter, please call our office to schedule the follow-up appointment.

## 2015-01-11 ENCOUNTER — Other Ambulatory Visit: Payer: Self-pay

## 2015-01-13 ENCOUNTER — Ambulatory Visit (HOSPITAL_COMMUNITY): Payer: Medicare Other | Attending: Internal Medicine

## 2015-01-13 ENCOUNTER — Other Ambulatory Visit (INDEPENDENT_AMBULATORY_CARE_PROVIDER_SITE_OTHER): Payer: Medicare Other | Admitting: *Deleted

## 2015-01-13 DIAGNOSIS — I712 Thoracic aortic aneurysm, without rupture: Secondary | ICD-10-CM | POA: Diagnosis not present

## 2015-01-13 DIAGNOSIS — E7800 Pure hypercholesterolemia, unspecified: Secondary | ICD-10-CM

## 2015-01-13 DIAGNOSIS — I7121 Aneurysm of the ascending aorta, without rupture: Secondary | ICD-10-CM

## 2015-01-13 DIAGNOSIS — M5136 Other intervertebral disc degeneration, lumbar region: Secondary | ICD-10-CM | POA: Diagnosis not present

## 2015-01-13 DIAGNOSIS — M9903 Segmental and somatic dysfunction of lumbar region: Secondary | ICD-10-CM | POA: Diagnosis not present

## 2015-01-13 LAB — HEPATIC FUNCTION PANEL
ALT: 13 U/L (ref 0–53)
AST: 17 U/L (ref 0–37)
Albumin: 3.7 g/dL (ref 3.5–5.2)
Alkaline Phosphatase: 61 U/L (ref 39–117)
Bilirubin, Direct: 0.1 mg/dL (ref 0.0–0.3)
Total Bilirubin: 0.8 mg/dL (ref 0.2–1.2)
Total Protein: 6.1 g/dL (ref 6.0–8.3)

## 2015-01-13 LAB — LIPID PANEL
Cholesterol: 164 mg/dL (ref 0–200)
HDL: 58.8 mg/dL (ref >39.00)
LDL Cholesterol: 86 mg/dL (ref 0–99)
NonHDL: 105.2
Total CHOL/HDL Ratio: 3
Triglycerides: 96 mg/dL (ref 0.0–149.0)
VLDL: 19.2 mg/dL (ref 0.0–40.0)

## 2015-01-13 LAB — BASIC METABOLIC PANEL
BUN: 16 mg/dL (ref 6–23)
CO2: 29 mEq/L (ref 19–32)
Calcium: 9.2 mg/dL (ref 8.4–10.5)
Chloride: 107 mEq/L (ref 96–112)
Creatinine, Ser: 1.03 mg/dL (ref 0.40–1.50)
GFR: 75.06 mL/min (ref >60.00)
Glucose, Bld: 95 mg/dL (ref 70–99)
Potassium: 4 mEq/L (ref 3.5–5.1)
Sodium: 138 mEq/L (ref 135–145)

## 2015-01-13 NOTE — Addendum Note (Signed)
Addended by: Eulis Foster on: 01/13/2015 08:13 AM   Modules accepted: Orders

## 2015-01-13 NOTE — Progress Notes (Signed)
2D Echo completed. 01/13/2015 

## 2015-01-21 DIAGNOSIS — K4090 Unilateral inguinal hernia, without obstruction or gangrene, not specified as recurrent: Secondary | ICD-10-CM | POA: Diagnosis not present

## 2015-01-24 DIAGNOSIS — K4090 Unilateral inguinal hernia, without obstruction or gangrene, not specified as recurrent: Secondary | ICD-10-CM | POA: Diagnosis not present

## 2015-01-27 DIAGNOSIS — M5136 Other intervertebral disc degeneration, lumbar region: Secondary | ICD-10-CM | POA: Diagnosis not present

## 2015-01-27 DIAGNOSIS — M9903 Segmental and somatic dysfunction of lumbar region: Secondary | ICD-10-CM | POA: Diagnosis not present

## 2015-02-07 DIAGNOSIS — M545 Low back pain: Secondary | ICD-10-CM | POA: Diagnosis not present

## 2015-02-07 DIAGNOSIS — M5126 Other intervertebral disc displacement, lumbar region: Secondary | ICD-10-CM | POA: Diagnosis not present

## 2015-02-07 DIAGNOSIS — M791 Myalgia: Secondary | ICD-10-CM | POA: Diagnosis not present

## 2015-02-07 DIAGNOSIS — M5137 Other intervertebral disc degeneration, lumbosacral region: Secondary | ICD-10-CM | POA: Diagnosis not present

## 2015-02-07 DIAGNOSIS — M25551 Pain in right hip: Secondary | ICD-10-CM | POA: Diagnosis not present

## 2015-02-07 DIAGNOSIS — M542 Cervicalgia: Secondary | ICD-10-CM | POA: Diagnosis not present

## 2015-02-07 DIAGNOSIS — M461 Sacroiliitis, not elsewhere classified: Secondary | ICD-10-CM | POA: Diagnosis not present

## 2015-02-07 DIAGNOSIS — M5136 Other intervertebral disc degeneration, lumbar region: Secondary | ICD-10-CM | POA: Diagnosis not present

## 2015-02-07 DIAGNOSIS — M5127 Other intervertebral disc displacement, lumbosacral region: Secondary | ICD-10-CM | POA: Diagnosis not present

## 2015-02-07 DIAGNOSIS — M5410 Radiculopathy, site unspecified: Secondary | ICD-10-CM | POA: Diagnosis not present

## 2015-02-08 DIAGNOSIS — M5127 Other intervertebral disc displacement, lumbosacral region: Secondary | ICD-10-CM | POA: Diagnosis not present

## 2015-02-08 DIAGNOSIS — M5410 Radiculopathy, site unspecified: Secondary | ICD-10-CM | POA: Diagnosis not present

## 2015-02-08 DIAGNOSIS — M25551 Pain in right hip: Secondary | ICD-10-CM | POA: Diagnosis not present

## 2015-02-08 DIAGNOSIS — M5136 Other intervertebral disc degeneration, lumbar region: Secondary | ICD-10-CM | POA: Diagnosis not present

## 2015-02-08 DIAGNOSIS — M5137 Other intervertebral disc degeneration, lumbosacral region: Secondary | ICD-10-CM | POA: Diagnosis not present

## 2015-02-08 DIAGNOSIS — M791 Myalgia: Secondary | ICD-10-CM | POA: Diagnosis not present

## 2015-02-08 DIAGNOSIS — M542 Cervicalgia: Secondary | ICD-10-CM | POA: Diagnosis not present

## 2015-02-08 DIAGNOSIS — M545 Low back pain: Secondary | ICD-10-CM | POA: Diagnosis not present

## 2015-02-08 DIAGNOSIS — M5126 Other intervertebral disc displacement, lumbar region: Secondary | ICD-10-CM | POA: Diagnosis not present

## 2015-02-08 DIAGNOSIS — M461 Sacroiliitis, not elsewhere classified: Secondary | ICD-10-CM | POA: Diagnosis not present

## 2015-02-10 DIAGNOSIS — M542 Cervicalgia: Secondary | ICD-10-CM | POA: Diagnosis not present

## 2015-02-10 DIAGNOSIS — M5136 Other intervertebral disc degeneration, lumbar region: Secondary | ICD-10-CM | POA: Diagnosis not present

## 2015-02-10 DIAGNOSIS — M791 Myalgia: Secondary | ICD-10-CM | POA: Diagnosis not present

## 2015-02-10 DIAGNOSIS — M5126 Other intervertebral disc displacement, lumbar region: Secondary | ICD-10-CM | POA: Diagnosis not present

## 2015-02-10 DIAGNOSIS — M5127 Other intervertebral disc displacement, lumbosacral region: Secondary | ICD-10-CM | POA: Diagnosis not present

## 2015-02-10 DIAGNOSIS — M25551 Pain in right hip: Secondary | ICD-10-CM | POA: Diagnosis not present

## 2015-02-10 DIAGNOSIS — M5410 Radiculopathy, site unspecified: Secondary | ICD-10-CM | POA: Diagnosis not present

## 2015-02-10 DIAGNOSIS — M5137 Other intervertebral disc degeneration, lumbosacral region: Secondary | ICD-10-CM | POA: Diagnosis not present

## 2015-02-10 DIAGNOSIS — M461 Sacroiliitis, not elsewhere classified: Secondary | ICD-10-CM | POA: Diagnosis not present

## 2015-02-10 DIAGNOSIS — M545 Low back pain: Secondary | ICD-10-CM | POA: Diagnosis not present

## 2015-02-11 DIAGNOSIS — M25551 Pain in right hip: Secondary | ICD-10-CM | POA: Diagnosis not present

## 2015-02-11 DIAGNOSIS — M5137 Other intervertebral disc degeneration, lumbosacral region: Secondary | ICD-10-CM | POA: Diagnosis not present

## 2015-02-11 DIAGNOSIS — M542 Cervicalgia: Secondary | ICD-10-CM | POA: Diagnosis not present

## 2015-02-11 DIAGNOSIS — M5136 Other intervertebral disc degeneration, lumbar region: Secondary | ICD-10-CM | POA: Diagnosis not present

## 2015-02-11 DIAGNOSIS — M5126 Other intervertebral disc displacement, lumbar region: Secondary | ICD-10-CM | POA: Diagnosis not present

## 2015-02-11 DIAGNOSIS — M545 Low back pain: Secondary | ICD-10-CM | POA: Diagnosis not present

## 2015-02-11 DIAGNOSIS — M791 Myalgia: Secondary | ICD-10-CM | POA: Diagnosis not present

## 2015-02-11 DIAGNOSIS — M5410 Radiculopathy, site unspecified: Secondary | ICD-10-CM | POA: Diagnosis not present

## 2015-02-11 DIAGNOSIS — M5127 Other intervertebral disc displacement, lumbosacral region: Secondary | ICD-10-CM | POA: Diagnosis not present

## 2015-02-11 DIAGNOSIS — M461 Sacroiliitis, not elsewhere classified: Secondary | ICD-10-CM | POA: Diagnosis not present

## 2015-02-15 DIAGNOSIS — M5126 Other intervertebral disc displacement, lumbar region: Secondary | ICD-10-CM | POA: Diagnosis not present

## 2015-02-15 DIAGNOSIS — M461 Sacroiliitis, not elsewhere classified: Secondary | ICD-10-CM | POA: Diagnosis not present

## 2015-02-15 DIAGNOSIS — M542 Cervicalgia: Secondary | ICD-10-CM | POA: Diagnosis not present

## 2015-02-15 DIAGNOSIS — M545 Low back pain: Secondary | ICD-10-CM | POA: Diagnosis not present

## 2015-02-15 DIAGNOSIS — M5127 Other intervertebral disc displacement, lumbosacral region: Secondary | ICD-10-CM | POA: Diagnosis not present

## 2015-02-15 DIAGNOSIS — M5137 Other intervertebral disc degeneration, lumbosacral region: Secondary | ICD-10-CM | POA: Diagnosis not present

## 2015-02-15 DIAGNOSIS — M791 Myalgia: Secondary | ICD-10-CM | POA: Diagnosis not present

## 2015-02-15 DIAGNOSIS — M25551 Pain in right hip: Secondary | ICD-10-CM | POA: Diagnosis not present

## 2015-02-15 DIAGNOSIS — M5136 Other intervertebral disc degeneration, lumbar region: Secondary | ICD-10-CM | POA: Diagnosis not present

## 2015-02-15 DIAGNOSIS — M5410 Radiculopathy, site unspecified: Secondary | ICD-10-CM | POA: Diagnosis not present

## 2015-03-03 DIAGNOSIS — M5136 Other intervertebral disc degeneration, lumbar region: Secondary | ICD-10-CM | POA: Diagnosis not present

## 2015-03-03 DIAGNOSIS — M5126 Other intervertebral disc displacement, lumbar region: Secondary | ICD-10-CM | POA: Diagnosis not present

## 2015-03-03 DIAGNOSIS — M545 Low back pain: Secondary | ICD-10-CM | POA: Diagnosis not present

## 2015-03-03 DIAGNOSIS — M25551 Pain in right hip: Secondary | ICD-10-CM | POA: Diagnosis not present

## 2015-03-03 DIAGNOSIS — M542 Cervicalgia: Secondary | ICD-10-CM | POA: Diagnosis not present

## 2015-03-03 DIAGNOSIS — M461 Sacroiliitis, not elsewhere classified: Secondary | ICD-10-CM | POA: Diagnosis not present

## 2015-03-03 DIAGNOSIS — M5127 Other intervertebral disc displacement, lumbosacral region: Secondary | ICD-10-CM | POA: Diagnosis not present

## 2015-03-03 DIAGNOSIS — M5137 Other intervertebral disc degeneration, lumbosacral region: Secondary | ICD-10-CM | POA: Diagnosis not present

## 2015-03-03 DIAGNOSIS — M791 Myalgia: Secondary | ICD-10-CM | POA: Diagnosis not present

## 2015-03-03 DIAGNOSIS — M5410 Radiculopathy, site unspecified: Secondary | ICD-10-CM | POA: Diagnosis not present

## 2015-03-07 DIAGNOSIS — M461 Sacroiliitis, not elsewhere classified: Secondary | ICD-10-CM | POA: Diagnosis not present

## 2015-03-07 DIAGNOSIS — M25551 Pain in right hip: Secondary | ICD-10-CM | POA: Diagnosis not present

## 2015-03-07 DIAGNOSIS — M5410 Radiculopathy, site unspecified: Secondary | ICD-10-CM | POA: Diagnosis not present

## 2015-03-07 DIAGNOSIS — M5126 Other intervertebral disc displacement, lumbar region: Secondary | ICD-10-CM | POA: Diagnosis not present

## 2015-03-07 DIAGNOSIS — M5136 Other intervertebral disc degeneration, lumbar region: Secondary | ICD-10-CM | POA: Diagnosis not present

## 2015-03-07 DIAGNOSIS — M545 Low back pain: Secondary | ICD-10-CM | POA: Diagnosis not present

## 2015-03-07 DIAGNOSIS — M5137 Other intervertebral disc degeneration, lumbosacral region: Secondary | ICD-10-CM | POA: Diagnosis not present

## 2015-03-07 DIAGNOSIS — M542 Cervicalgia: Secondary | ICD-10-CM | POA: Diagnosis not present

## 2015-03-07 DIAGNOSIS — M791 Myalgia: Secondary | ICD-10-CM | POA: Diagnosis not present

## 2015-03-07 DIAGNOSIS — M5127 Other intervertebral disc displacement, lumbosacral region: Secondary | ICD-10-CM | POA: Diagnosis not present

## 2015-03-08 DIAGNOSIS — M1612 Unilateral primary osteoarthritis, left hip: Secondary | ICD-10-CM | POA: Diagnosis not present

## 2015-03-09 DIAGNOSIS — M461 Sacroiliitis, not elsewhere classified: Secondary | ICD-10-CM | POA: Diagnosis not present

## 2015-03-09 DIAGNOSIS — M5126 Other intervertebral disc displacement, lumbar region: Secondary | ICD-10-CM | POA: Diagnosis not present

## 2015-03-09 DIAGNOSIS — M791 Myalgia: Secondary | ICD-10-CM | POA: Diagnosis not present

## 2015-03-09 DIAGNOSIS — M25551 Pain in right hip: Secondary | ICD-10-CM | POA: Diagnosis not present

## 2015-03-09 DIAGNOSIS — M5410 Radiculopathy, site unspecified: Secondary | ICD-10-CM | POA: Diagnosis not present

## 2015-03-09 DIAGNOSIS — M542 Cervicalgia: Secondary | ICD-10-CM | POA: Diagnosis not present

## 2015-03-09 DIAGNOSIS — M5127 Other intervertebral disc displacement, lumbosacral region: Secondary | ICD-10-CM | POA: Diagnosis not present

## 2015-03-09 DIAGNOSIS — M5137 Other intervertebral disc degeneration, lumbosacral region: Secondary | ICD-10-CM | POA: Diagnosis not present

## 2015-03-09 DIAGNOSIS — M545 Low back pain: Secondary | ICD-10-CM | POA: Diagnosis not present

## 2015-03-09 DIAGNOSIS — M5136 Other intervertebral disc degeneration, lumbar region: Secondary | ICD-10-CM | POA: Diagnosis not present

## 2015-03-14 ENCOUNTER — Other Ambulatory Visit: Payer: Self-pay

## 2015-03-14 DIAGNOSIS — M5127 Other intervertebral disc displacement, lumbosacral region: Secondary | ICD-10-CM | POA: Diagnosis not present

## 2015-03-14 DIAGNOSIS — M25551 Pain in right hip: Secondary | ICD-10-CM | POA: Diagnosis not present

## 2015-03-14 DIAGNOSIS — M545 Low back pain: Secondary | ICD-10-CM | POA: Diagnosis not present

## 2015-03-14 DIAGNOSIS — M542 Cervicalgia: Secondary | ICD-10-CM | POA: Diagnosis not present

## 2015-03-14 DIAGNOSIS — M5137 Other intervertebral disc degeneration, lumbosacral region: Secondary | ICD-10-CM | POA: Diagnosis not present

## 2015-03-14 DIAGNOSIS — M5410 Radiculopathy, site unspecified: Secondary | ICD-10-CM | POA: Diagnosis not present

## 2015-03-14 DIAGNOSIS — M5126 Other intervertebral disc displacement, lumbar region: Secondary | ICD-10-CM | POA: Diagnosis not present

## 2015-03-14 DIAGNOSIS — M791 Myalgia: Secondary | ICD-10-CM | POA: Diagnosis not present

## 2015-03-14 DIAGNOSIS — M461 Sacroiliitis, not elsewhere classified: Secondary | ICD-10-CM | POA: Diagnosis not present

## 2015-03-14 DIAGNOSIS — M5136 Other intervertebral disc degeneration, lumbar region: Secondary | ICD-10-CM | POA: Diagnosis not present

## 2015-03-15 DIAGNOSIS — K4090 Unilateral inguinal hernia, without obstruction or gangrene, not specified as recurrent: Secondary | ICD-10-CM | POA: Diagnosis not present

## 2015-03-16 ENCOUNTER — Encounter: Payer: Self-pay | Admitting: General Surgery

## 2015-03-16 DIAGNOSIS — M545 Low back pain: Secondary | ICD-10-CM | POA: Diagnosis not present

## 2015-03-16 DIAGNOSIS — C44311 Basal cell carcinoma of skin of nose: Secondary | ICD-10-CM | POA: Diagnosis not present

## 2015-03-16 DIAGNOSIS — D0472 Carcinoma in situ of skin of left lower limb, including hip: Secondary | ICD-10-CM | POA: Diagnosis not present

## 2015-03-16 DIAGNOSIS — M5136 Other intervertebral disc degeneration, lumbar region: Secondary | ICD-10-CM | POA: Diagnosis not present

## 2015-03-16 DIAGNOSIS — M5126 Other intervertebral disc displacement, lumbar region: Secondary | ICD-10-CM | POA: Diagnosis not present

## 2015-03-16 DIAGNOSIS — M791 Myalgia: Secondary | ICD-10-CM | POA: Diagnosis not present

## 2015-03-16 DIAGNOSIS — M5137 Other intervertebral disc degeneration, lumbosacral region: Secondary | ICD-10-CM | POA: Diagnosis not present

## 2015-03-16 DIAGNOSIS — C44229 Squamous cell carcinoma of skin of left ear and external auricular canal: Secondary | ICD-10-CM | POA: Diagnosis not present

## 2015-03-16 DIAGNOSIS — D485 Neoplasm of uncertain behavior of skin: Secondary | ICD-10-CM | POA: Diagnosis not present

## 2015-03-16 DIAGNOSIS — M461 Sacroiliitis, not elsewhere classified: Secondary | ICD-10-CM | POA: Diagnosis not present

## 2015-03-16 DIAGNOSIS — M5127 Other intervertebral disc displacement, lumbosacral region: Secondary | ICD-10-CM | POA: Diagnosis not present

## 2015-03-16 DIAGNOSIS — M5410 Radiculopathy, site unspecified: Secondary | ICD-10-CM | POA: Diagnosis not present

## 2015-03-16 DIAGNOSIS — M25551 Pain in right hip: Secondary | ICD-10-CM | POA: Diagnosis not present

## 2015-03-16 DIAGNOSIS — M542 Cervicalgia: Secondary | ICD-10-CM | POA: Diagnosis not present

## 2015-03-16 DIAGNOSIS — L570 Actinic keratosis: Secondary | ICD-10-CM | POA: Diagnosis not present

## 2015-03-16 NOTE — Progress Notes (Signed)
Mar N. Furgason 03/15/2015 4:03 PM Location: [facility] Patient #: [id number] DOB: 1941/05/15 Married / Language: Cleophus Molt / Race: White Male  History of Present Illness Odis Hollingshead MD; 03/15/2015 5:55 PM) Patient words: LTFU check LIH.  The patient is a 74 year old male   Note:He is back after his vacations and presents to talk about repair of his left inguinal hernia. He still has occasional pain in the left groin area.   Allergies Elbert Ewings, [place]; 03/15/2015 4:04 PM) No Known Drug Allergies11/18/2015  Medication History Elbert Ewings, [place]; 03/15/2015 4:04 PM) Fish Oil (1000MG  Capsule DR, Oral) Active. Zinc Gluconate (50MG  Tablet, Oral) Active. Tretinoin (0.025% Cream, External) Active. Atorvastatin Calcium (40MG  Tablet, Oral) Active. Chelated Calcium (200MG  Tablet, Oral) Active. E 400 Blended (400UNIT Capsule, Oral) Active. Boswellia Serrata Extract Active. Fish Oil (1200MG  Capsule DR, Oral) Active. Ubiquinol (100MG  Capsule, Oral) Active. Turmeric Curcumin (Oral) Active. C 1000-Bioflavonoids-Rose Hips (1000-25MG  Capsule, Oral) Active. Glutamine (Oral) Active. Multi Complete (Oral) Active. Healthy Eyes (Oral) Active. Vitamin D (Cholecalciferol) (1000UNIT Tablet, Oral) Active. Enteric Coated Aspirin (500MG  Tablet DR, Oral) Active. Medications Reconciled  Vitals Elbert Ewings CMA; 03/15/2015 4:04 PM) 03/15/2015 4:04 PM Weight: 175 lb Height: 69in Body Surface Area: 1.97 m Body Mass Index: 25.84 kg/m Temp.: 98.34F(Oral)  Pulse: 65 (Regular)  BP: 130/70 (Sitting, Left Arm, Standard)    Physical Exam Odis Hollingshead MD; 03/15/2015 4:30 PM) The physical exam findings are as follows: Note:General-well-developed, well-nourished in no acute distress.  GU-right groin scar. Left groin bulge that is reducible.    Assessment & Plan Odis Hollingshead MD; 03/15/2015 4:32 PM) LEFT INGUINAL HERNIA (550.90   K40.90) Impression: Having intermittent pains but did well on his vacations. He wants to build a shed with his son this summer prior to having the repair.  Plan: Left inguinal hernia repair with mesh. I asked him to call us when he would like to have it scheduled. We talked about stopping the aspirin and fish oil 5 days before the surgery. I have explained the procedure, risks, and aftercare of inguinal hernia repair. Risks include but are not limited to bleeding, infection, wound problems, anesthesia, recurrence, bladder or intestine injury, urinary retention, testicular dysfunction, chronic pain, mesh problems. He seems to understand and agrees to proceed.  Jackolyn Confer, MD

## 2015-03-25 DIAGNOSIS — M5137 Other intervertebral disc degeneration, lumbosacral region: Secondary | ICD-10-CM | POA: Diagnosis not present

## 2015-03-25 DIAGNOSIS — M461 Sacroiliitis, not elsewhere classified: Secondary | ICD-10-CM | POA: Diagnosis not present

## 2015-03-25 DIAGNOSIS — R202 Paresthesia of skin: Secondary | ICD-10-CM | POA: Diagnosis not present

## 2015-03-25 DIAGNOSIS — M791 Myalgia: Secondary | ICD-10-CM | POA: Diagnosis not present

## 2015-03-25 DIAGNOSIS — M5126 Other intervertebral disc displacement, lumbar region: Secondary | ICD-10-CM | POA: Diagnosis not present

## 2015-03-25 DIAGNOSIS — M545 Low back pain: Secondary | ICD-10-CM | POA: Diagnosis not present

## 2015-03-25 DIAGNOSIS — M542 Cervicalgia: Secondary | ICD-10-CM | POA: Diagnosis not present

## 2015-03-28 DIAGNOSIS — M791 Myalgia: Secondary | ICD-10-CM | POA: Diagnosis not present

## 2015-03-28 DIAGNOSIS — M461 Sacroiliitis, not elsewhere classified: Secondary | ICD-10-CM | POA: Diagnosis not present

## 2015-03-28 DIAGNOSIS — M5137 Other intervertebral disc degeneration, lumbosacral region: Secondary | ICD-10-CM | POA: Diagnosis not present

## 2015-03-28 DIAGNOSIS — M542 Cervicalgia: Secondary | ICD-10-CM | POA: Diagnosis not present

## 2015-03-28 DIAGNOSIS — R202 Paresthesia of skin: Secondary | ICD-10-CM | POA: Diagnosis not present

## 2015-03-28 DIAGNOSIS — M545 Low back pain: Secondary | ICD-10-CM | POA: Diagnosis not present

## 2015-03-28 DIAGNOSIS — M5126 Other intervertebral disc displacement, lumbar region: Secondary | ICD-10-CM | POA: Diagnosis not present

## 2015-03-30 ENCOUNTER — Ambulatory Visit
Admission: RE | Admit: 2015-03-30 | Discharge: 2015-03-30 | Disposition: A | Discharge status: Home / Self Care | Payer: Medicare Other | Source: Ambulatory Visit | Attending: Radiation Oncology | Admitting: Radiation Oncology

## 2015-03-30 ENCOUNTER — Encounter: Payer: Self-pay | Admitting: Radiation Oncology

## 2015-03-30 VITALS — BP 142/91 | HR 65 | Temp 98.5°F | Resp 20 | Ht 69.0 in | Wt 177.1 lb

## 2015-03-30 DIAGNOSIS — M542 Cervicalgia: Secondary | ICD-10-CM | POA: Diagnosis not present

## 2015-03-30 DIAGNOSIS — C01 Malignant neoplasm of base of tongue: Secondary | ICD-10-CM

## 2015-03-30 DIAGNOSIS — R202 Paresthesia of skin: Secondary | ICD-10-CM | POA: Diagnosis not present

## 2015-03-30 DIAGNOSIS — M791 Myalgia: Secondary | ICD-10-CM | POA: Diagnosis not present

## 2015-03-30 DIAGNOSIS — M461 Sacroiliitis, not elsewhere classified: Secondary | ICD-10-CM | POA: Diagnosis not present

## 2015-03-30 DIAGNOSIS — M5126 Other intervertebral disc displacement, lumbar region: Secondary | ICD-10-CM | POA: Diagnosis not present

## 2015-03-30 DIAGNOSIS — M545 Low back pain: Secondary | ICD-10-CM | POA: Diagnosis not present

## 2015-03-30 DIAGNOSIS — M5137 Other intervertebral disc degeneration, lumbosacral region: Secondary | ICD-10-CM | POA: Diagnosis not present

## 2015-03-30 NOTE — Progress Notes (Signed)
Follow up s/p rad base tongue on 06/18/11-08/03/11  Eats most foods, chews gum  And doesn't have dry mouth stated, no nausea,   Declines to have laryngoscopy today, no fatigue, vitals okay no c/o pain 1:59 PM BP 142/91 mmHg  Pulse 65  Temp(Src) 98.5 F (36.9 C) (Oral)  Resp 20  Ht 5\' 9"  (1.753 m)  Wt 177 lb 1.6 oz (80.332 kg)  BMI 26.14 kg/m2  SpO2 98%  Wt Readings from Last 3 Encounters:  03/30/15 177 lb 1.6 oz (80.332 kg)  01/10/15 176 lb 12.8 oz (80.196 kg)  09/30/14 179 lb 3.2 oz (81.285 kg)

## 2015-03-30 NOTE — Progress Notes (Signed)
Radiation Oncology         [phone] ________________________________  Name: Jason Noble MRN: [id number]  Date: 03/30/2015  DOB: [birth year]  Follow-Up Visit Note  CC: Shirline Frees, MD  Shirline Frees, MD  Diagnosis:   Squamous cell carcinoma of the base of tongue  Interval Since Last Radiation:  3 1/2 years   Narrative:  The patient returns today for routine follow-up.  The patient is in good spirits today. He notes continued xerostomia but manages this at this time with constant gum in the mouth. He feels this really helps his salivary function. He feels this is better than carrying around a water bottle. He is not using Salagen. Otherwise no significant new issues or complaints.  ALLERGIES:  has No Known Allergies.  Meds: Current Outpatient Prescriptions  Medication Sig Dispense Refill  . aspirin EC 325 MG EC tablet Take 325 mg by mouth every morning.     Marland Kitchen atorvastatin (LIPITOR) 40 MG tablet Take 40 mg by mouth daily. Take 1 tablet 4 times a week    . calcium carbonate (OS-CAL) 600 MG TABS Take 1,200 mg by mouth every evening.     . cholecalciferol (VITAMIN D) 1000 UNITS tablet Take 1,000 Units by mouth daily.    . fish oil-omega-3 fatty acids 1000 MG capsule Take 1 g by mouth daily.     . fluorouracil (EFUDEX) 5 % cream Apply 5 Act topically daily as needed.    . L-Glutamine 500 MG TABS Take 1 tablet by mouth every evening.    . Lecithin 1200 MG CAPS Take 1 capsule by mouth daily.    . Misc Natural Products (OSTEO BI-FLEX JOINT SHIELD PO) Take 1 tablet by mouth every evening. Pt not sure of dosage.    . Multiple Vitamin (MULTIVITAMIN WITH MINERALS) TABS tablet Take 1 tablet by mouth daily.    . niacinamide 500 MG tablet Take 500 mg by mouth every evening.    . pyridOXINE (VITAMIN B-6) 50 MG tablet Take 50 mg by mouth daily.    . Sodium Fluoride 1.1 % PSTE Apply thin ribbon of cream to toothbrush. Brush teeth for 2 minutes at bedtime. Spit out excess. Do not  swallow. Either this or fluorishield    . tretinoin (RETIN-A) 0.025 % cream Apply 1 application topically at bedtime. As Directed    . Turmeric 450 MG CAPS Take 1 capsule by mouth daily.    Marland Kitchen UBIQUINOL PO Take 100 mg by mouth daily.     Marland Kitchen VITAMIN A PO Take 1 tablet by mouth daily.    . vitamin C (ASCORBIC ACID) 500 MG tablet Take 500 mg by mouth daily.    . vitamin E 400 UNIT capsule Take 400 Units by mouth every morning.     . zinc gluconate 50 MG tablet Take 25 mg by mouth daily.    . Misc Natural Products (LUTEIN 20 PO) Take 1 tablet by mouth daily.     No current facility-administered medications for this encounter.   Facility-Administered Medications Ordered in Other Encounters  Medication Dose Route Frequency Provider Last Rate Last Dose  . topical emolient (BIAFINE) emulsion   Topical BID Kyung Rudd, MD        Physical Findings: The patient is in no acute distress. Patient is alert and oriented.  height is 5\' 9"  (1.753 m) and weight is 177 lb 1.6 oz (80.332 kg). His oral temperature is 98.5 F (36.9 C). His blood pressure is 142/91 and his  pulse is 65. His respiration is 20 and oxygen saturation is 98%. .   Oral cavity clear with no suspicious findings. Some moistness present. No cervical lymphadenopathy present. His skin looks very good post radiation treatment Fiberoptic exam: Deferred today at patient's request.   Lab Findings: Lab Results  Component Value Date   WBC 5.7 09/20/2014   HGB 14.1 09/20/2014   HCT 44.9 09/20/2014   MCV 92.4 09/20/2014   PLT 187 09/20/2014     Radiographic Findings: No results found.  Impression:    The patient is doing well at this time clinically, he remains clinically NED. Laryngoscopy deferred today at his request.  Plan:  Patient will recurrent to clinic for ongoing followup in 8 months.   Jodelle Gross, M.D., Ph.D.

## 2015-04-04 DIAGNOSIS — M461 Sacroiliitis, not elsewhere classified: Secondary | ICD-10-CM | POA: Diagnosis not present

## 2015-04-04 DIAGNOSIS — R202 Paresthesia of skin: Secondary | ICD-10-CM | POA: Diagnosis not present

## 2015-04-04 DIAGNOSIS — M5126 Other intervertebral disc displacement, lumbar region: Secondary | ICD-10-CM | POA: Diagnosis not present

## 2015-04-04 DIAGNOSIS — M5137 Other intervertebral disc degeneration, lumbosacral region: Secondary | ICD-10-CM | POA: Diagnosis not present

## 2015-04-04 DIAGNOSIS — M542 Cervicalgia: Secondary | ICD-10-CM | POA: Diagnosis not present

## 2015-04-04 DIAGNOSIS — M545 Low back pain: Secondary | ICD-10-CM | POA: Diagnosis not present

## 2015-04-04 DIAGNOSIS — M791 Myalgia: Secondary | ICD-10-CM | POA: Diagnosis not present

## 2015-04-06 DIAGNOSIS — R202 Paresthesia of skin: Secondary | ICD-10-CM | POA: Diagnosis not present

## 2015-04-06 DIAGNOSIS — M545 Low back pain: Secondary | ICD-10-CM | POA: Diagnosis not present

## 2015-04-06 DIAGNOSIS — M461 Sacroiliitis, not elsewhere classified: Secondary | ICD-10-CM | POA: Diagnosis not present

## 2015-04-06 DIAGNOSIS — M791 Myalgia: Secondary | ICD-10-CM | POA: Diagnosis not present

## 2015-04-06 DIAGNOSIS — M5126 Other intervertebral disc displacement, lumbar region: Secondary | ICD-10-CM | POA: Diagnosis not present

## 2015-04-06 DIAGNOSIS — M5137 Other intervertebral disc degeneration, lumbosacral region: Secondary | ICD-10-CM | POA: Diagnosis not present

## 2015-04-06 DIAGNOSIS — M542 Cervicalgia: Secondary | ICD-10-CM | POA: Diagnosis not present

## 2015-04-11 DIAGNOSIS — M461 Sacroiliitis, not elsewhere classified: Secondary | ICD-10-CM | POA: Diagnosis not present

## 2015-04-11 DIAGNOSIS — M542 Cervicalgia: Secondary | ICD-10-CM | POA: Diagnosis not present

## 2015-04-11 DIAGNOSIS — M791 Myalgia: Secondary | ICD-10-CM | POA: Diagnosis not present

## 2015-04-11 DIAGNOSIS — R202 Paresthesia of skin: Secondary | ICD-10-CM | POA: Diagnosis not present

## 2015-04-11 DIAGNOSIS — M5137 Other intervertebral disc degeneration, lumbosacral region: Secondary | ICD-10-CM | POA: Diagnosis not present

## 2015-04-11 DIAGNOSIS — M545 Low back pain: Secondary | ICD-10-CM | POA: Diagnosis not present

## 2015-04-11 DIAGNOSIS — M5126 Other intervertebral disc displacement, lumbar region: Secondary | ICD-10-CM | POA: Diagnosis not present

## 2015-04-14 DIAGNOSIS — C44229 Squamous cell carcinoma of skin of left ear and external auricular canal: Secondary | ICD-10-CM | POA: Diagnosis not present

## 2015-04-18 DIAGNOSIS — L821 Other seborrheic keratosis: Secondary | ICD-10-CM | POA: Diagnosis not present

## 2015-04-18 DIAGNOSIS — L814 Other melanin hyperpigmentation: Secondary | ICD-10-CM | POA: Diagnosis not present

## 2015-04-18 DIAGNOSIS — L570 Actinic keratosis: Secondary | ICD-10-CM | POA: Diagnosis not present

## 2015-04-18 DIAGNOSIS — Z85828 Personal history of other malignant neoplasm of skin: Secondary | ICD-10-CM | POA: Diagnosis not present

## 2015-04-18 DIAGNOSIS — D225 Melanocytic nevi of trunk: Secondary | ICD-10-CM | POA: Diagnosis not present

## 2015-04-18 DIAGNOSIS — C44719 Basal cell carcinoma of skin of left lower limb, including hip: Secondary | ICD-10-CM | POA: Diagnosis not present

## 2015-04-18 DIAGNOSIS — C44612 Basal cell carcinoma of skin of right upper limb, including shoulder: Secondary | ICD-10-CM | POA: Diagnosis not present

## 2015-04-18 DIAGNOSIS — D1800 Hemangioma unspecified site: Secondary | ICD-10-CM | POA: Diagnosis not present

## 2015-04-18 DIAGNOSIS — D485 Neoplasm of uncertain behavior of skin: Secondary | ICD-10-CM | POA: Diagnosis not present

## 2015-04-18 DIAGNOSIS — L719 Rosacea, unspecified: Secondary | ICD-10-CM | POA: Diagnosis not present

## 2015-04-25 DIAGNOSIS — M1612 Unilateral primary osteoarthritis, left hip: Secondary | ICD-10-CM | POA: Diagnosis not present

## 2015-04-25 DIAGNOSIS — M545 Low back pain: Secondary | ICD-10-CM | POA: Diagnosis not present

## 2015-04-25 DIAGNOSIS — M47816 Spondylosis without myelopathy or radiculopathy, lumbar region: Secondary | ICD-10-CM | POA: Diagnosis not present

## 2015-04-25 DIAGNOSIS — M25552 Pain in left hip: Secondary | ICD-10-CM | POA: Diagnosis not present

## 2015-04-29 ENCOUNTER — Other Ambulatory Visit: Payer: Self-pay | Admitting: Physical Medicine and Rehabilitation

## 2015-04-29 DIAGNOSIS — M545 Low back pain: Secondary | ICD-10-CM

## 2015-04-29 DIAGNOSIS — H2513 Age-related nuclear cataract, bilateral: Secondary | ICD-10-CM | POA: Diagnosis not present

## 2015-05-04 ENCOUNTER — Ambulatory Visit
Admission: RE | Admit: 2015-05-04 | Discharge: 2015-05-04 | Disposition: A | Discharge status: Home / Self Care | Payer: Medicare Other | Source: Ambulatory Visit | Attending: Physical Medicine and Rehabilitation | Admitting: Physical Medicine and Rehabilitation

## 2015-05-04 ENCOUNTER — Other Ambulatory Visit: Payer: Self-pay

## 2015-05-04 DIAGNOSIS — R202 Paresthesia of skin: Secondary | ICD-10-CM | POA: Diagnosis not present

## 2015-05-04 DIAGNOSIS — M5127 Other intervertebral disc displacement, lumbosacral region: Secondary | ICD-10-CM | POA: Diagnosis not present

## 2015-05-04 DIAGNOSIS — R531 Weakness: Secondary | ICD-10-CM | POA: Diagnosis not present

## 2015-05-04 DIAGNOSIS — M47817 Spondylosis without myelopathy or radiculopathy, lumbosacral region: Secondary | ICD-10-CM | POA: Diagnosis not present

## 2015-05-04 DIAGNOSIS — M5136 Other intervertebral disc degeneration, lumbar region: Secondary | ICD-10-CM | POA: Diagnosis not present

## 2015-05-04 DIAGNOSIS — M545 Low back pain: Secondary | ICD-10-CM

## 2015-05-11 DIAGNOSIS — M5116 Intervertebral disc disorders with radiculopathy, lumbar region: Secondary | ICD-10-CM | POA: Diagnosis not present

## 2015-05-11 DIAGNOSIS — M4316 Spondylolisthesis, lumbar region: Secondary | ICD-10-CM | POA: Diagnosis not present

## 2015-05-11 DIAGNOSIS — M5416 Radiculopathy, lumbar region: Secondary | ICD-10-CM | POA: Diagnosis not present

## 2015-05-11 DIAGNOSIS — M25552 Pain in left hip: Secondary | ICD-10-CM | POA: Diagnosis not present

## 2015-05-12 DIAGNOSIS — C44311 Basal cell carcinoma of skin of nose: Secondary | ICD-10-CM | POA: Diagnosis not present

## 2015-05-12 DIAGNOSIS — M25522 Pain in left elbow: Secondary | ICD-10-CM | POA: Diagnosis not present

## 2015-06-09 DIAGNOSIS — M5416 Radiculopathy, lumbar region: Secondary | ICD-10-CM | POA: Diagnosis not present

## 2015-06-09 DIAGNOSIS — M25552 Pain in left hip: Secondary | ICD-10-CM | POA: Diagnosis not present

## 2015-06-22 ENCOUNTER — Other Ambulatory Visit: Payer: Self-pay | Admitting: Cardiology

## 2015-06-22 NOTE — Telephone Encounter (Signed)
OK to refill atorvastatin. 

## 2015-06-22 NOTE — Telephone Encounter (Signed)
[facility] requesting a refill of Atorvastatin calcium 80 mg taken daily, medication list shows Atorvastatin 40 mg taken 4 times a week, pt has been on Atorvastatin 80 mg daily and did not see where it was decrease to 40 mg, taken 4 times a week, please advise.

## 2015-06-24 ENCOUNTER — Other Ambulatory Visit: Payer: Self-pay | Admitting: *Deleted

## 2015-06-24 ENCOUNTER — Other Ambulatory Visit: Payer: Self-pay

## 2015-06-24 DIAGNOSIS — E785 Hyperlipidemia, unspecified: Secondary | ICD-10-CM

## 2015-06-24 MED ORDER — ATORVASTATIN CALCIUM 40 MG PO TABS: 40.0000 mg | ORAL_TABLET | Freq: Every day | ORAL | Status: DC

## 2015-06-24 MED ORDER — ATORVASTATIN CALCIUM 40 MG PO TABS: ORAL_TABLET | ORAL | Status: DC

## 2015-06-24 NOTE — Telephone Encounter (Signed)
Spoke with patient and Lipitor should have been increased at last lab check Patient coming for labs on l0/18/16

## 2015-07-05 ENCOUNTER — Other Ambulatory Visit (INDEPENDENT_AMBULATORY_CARE_PROVIDER_SITE_OTHER): Payer: Medicare Other

## 2015-07-05 DIAGNOSIS — E7800 Pure hypercholesterolemia, unspecified: Secondary | ICD-10-CM

## 2015-07-05 LAB — BASIC METABOLIC PANEL
BUN: 24 mg/dL (ref 7–25)
CO2: 24 mmol/L (ref 20–31)
Calcium: 9.1 mg/dL (ref 8.6–10.3)
Chloride: 107 mmol/L (ref 98–110)
Creat: 0.95 mg/dL (ref 0.70–1.18)
Glucose, Bld: 80 mg/dL (ref 65–99)
Potassium: 4 mmol/L (ref 3.5–5.3)
Sodium: 140 mmol/L (ref 135–146)

## 2015-07-05 LAB — LIPID PANEL
Cholesterol: 164 mg/dL (ref 125–200)
HDL: 57 mg/dL (ref >=40)
LDL Cholesterol: 97 mg/dL (ref <130)
Total CHOL/HDL Ratio: 2.9 Ratio (ref <=5.0)
Triglycerides: 52 mg/dL (ref <150)
VLDL: 10 mg/dL (ref <30)

## 2015-07-05 LAB — HEPATIC FUNCTION PANEL
ALT: 15 U/L (ref 9–46)
AST: 19 U/L (ref 10–35)
Albumin: 3.7 g/dL (ref 3.6–5.1)
Alkaline Phosphatase: 55 U/L (ref 40–115)
Bilirubin, Direct: 0.2 mg/dL (ref <=0.2)
Indirect Bilirubin: 0.8 mg/dL (ref 0.2–1.2)
Total Bilirubin: 1 mg/dL (ref 0.2–1.2)
Total Protein: 6 g/dL — ABNORMAL LOW (ref 6.1–8.1)

## 2015-07-05 NOTE — Addendum Note (Signed)
Addended by: Velna Ochs on: 07/05/2015 09:23 AM   Modules accepted: Orders

## 2015-07-06 NOTE — Progress Notes (Signed)
Quick Note:  Please report to patient. The recent labs are stable. Continue same medication and careful diet. Liver function studies are normal. No LDL of 97 is above the target of 70. Work harder on diet since he was unable to tolerate higher doses of Lipitor. ______

## 2015-07-12 DIAGNOSIS — Z86010 Personal history of colonic polyps: Secondary | ICD-10-CM | POA: Diagnosis not present

## 2015-07-12 DIAGNOSIS — K5730 Diverticulosis of large intestine without perforation or abscess without bleeding: Secondary | ICD-10-CM | POA: Diagnosis not present

## 2015-07-12 DIAGNOSIS — Z09 Encounter for follow-up examination after completed treatment for conditions other than malignant neoplasm: Secondary | ICD-10-CM | POA: Diagnosis not present

## 2015-07-12 DIAGNOSIS — Z800 Family history of malignant neoplasm of digestive organs: Secondary | ICD-10-CM | POA: Diagnosis not present

## 2015-07-21 ENCOUNTER — Encounter: Payer: Self-pay | Admitting: General Surgery

## 2015-07-21 DIAGNOSIS — K4090 Unilateral inguinal hernia, without obstruction or gangrene, not specified as recurrent: Secondary | ICD-10-CM | POA: Diagnosis not present

## 2015-07-21 NOTE — Progress Notes (Unsigned)
Brittany N. Camerer 07/21/2015 10:18 AM Location: [facility] Patient #: [id number] DOB: [birth year] Married / Language: Cleophus Molt / Race: White Male   History of Present Illness Odis Hollingshead MD; 07/21/2015 10:38 AM) The patient is a 74 year old male.  Note:He is here, with his wife, to schedule left inguinal hernia repair. He wears a truss. It is intermittently symptomatic.  Allergies Elbert Ewings, CMA; 07/21/2015 10:18 AM) No Known Drug Allergies11/18/2015  Medication History Elbert Ewings, CMA; 07/21/2015 10:19 AM) Fish Oil (1000MG  Capsule DR, Oral) Active. Zinc Gluconate (50MG  Tablet, Oral) Active. Tretinoin (0.025% Cream, External) Active. Atorvastatin Calcium (40MG  Tablet, Oral) Active. Chelated Calcium (200MG  Tablet, Oral) Active. E 400 Blended (400UNIT Capsule, Oral) Active. Boswellia Serrata Extract Active. Fish Oil (1200MG  Capsule DR, Oral) Active. Ubiquinol (100MG  Capsule, Oral) Active. Turmeric Curcumin (Oral) Active. C 1000-Bioflavonoids-Rose Hips (1000-25MG  Capsule, Oral) Active. Glutamine (Oral) Active. Multi Complete (Oral) Active. Healthy Eyes (Oral) Active. Vitamin D (Cholecalciferol) (1000UNIT Tablet, Oral) Active. Enteric Coated Aspirin (500MG  Tablet DR, Oral) Active. Medications Reconciled  Vitals Elbert Ewings CMA; 07/21/2015 10:19 AM) 07/21/2015 10:19 AM Weight: 176 lb Height: 69in Body Surface Area: 1.96 m Body Mass Index: 25.99 kg/m  Temp.: 23F(Temporal)  Pulse: 70 (Regular)  BP: 138/78 (Sitting, Left Arm, Standard)       Physical Exam Odis Hollingshead MD; 07/21/2015 10:38 AM) The physical exam findings are as follows: Note:General-well-developed, well-nourished distress.  Abdomen-lower midline scar present.  GU-left inguinal bulge that is reducible.    Assessment & Plan Odis Hollingshead MD; 07/21/2015 10:39 AM) LEFT INGUINAL HERNIA (K40.90) Impression: He is ready to schedule  repair.  Plan: Open left inguinal hernia repair with mesh. The procedure and risks as well as aftercare have been discussed with him previously. He was told to stop his aspirin, vitamin E, and fish oil 5 days before the surgery.  T

## 2015-07-22 ENCOUNTER — Telehealth: Payer: Self-pay | Admitting: *Deleted

## 2015-07-22 NOTE — Telephone Encounter (Signed)
Left message to call back  

## 2015-07-22 NOTE — Telephone Encounter (Signed)
-----   Message from Darlin Coco, MD sent at 07/21/2015  5:10 PM EDT ----- Yes I would like to see him before surgery. We will call him. TB  ----- Message -----    From: Jackolyn Confer, MD    Sent: 07/21/2015   1:40 PM      To: Darlin Coco, MD  Tom,  I am going to schedule a left inguinal hernia repair on him in the next 3 months.  Do you need to see him prior to that for a cardiac evaluation?  Thanks,  Alta Corning.

## 2015-07-25 NOTE — Telephone Encounter (Signed)
Scheduled ov for patient

## 2015-07-25 NOTE — Telephone Encounter (Signed)
F/u    Pt returning Melinda's call.

## 2015-08-18 ENCOUNTER — Ambulatory Visit (INDEPENDENT_AMBULATORY_CARE_PROVIDER_SITE_OTHER): Payer: No Typology Code available for payment source | Admitting: Ophthalmology

## 2015-08-18 DIAGNOSIS — Z23 Encounter for immunization: Secondary | ICD-10-CM | POA: Diagnosis not present

## 2015-08-23 DIAGNOSIS — M25552 Pain in left hip: Secondary | ICD-10-CM | POA: Diagnosis not present

## 2015-08-25 ENCOUNTER — Ambulatory Visit: Payer: Self-pay | Admitting: Cardiology

## 2015-08-29 ENCOUNTER — Ambulatory Visit (INDEPENDENT_AMBULATORY_CARE_PROVIDER_SITE_OTHER): Payer: Medicare Other | Admitting: Physician Assistant

## 2015-08-29 ENCOUNTER — Encounter: Payer: Self-pay | Admitting: Physician Assistant

## 2015-08-29 VITALS — BP 140/80 | HR 59 | Ht 69.0 in | Wt 178.0 lb

## 2015-08-29 DIAGNOSIS — I252 Old myocardial infarction: Secondary | ICD-10-CM

## 2015-08-29 DIAGNOSIS — E7800 Pure hypercholesterolemia, unspecified: Secondary | ICD-10-CM | POA: Diagnosis not present

## 2015-08-29 DIAGNOSIS — I712 Thoracic aortic aneurysm, without rupture, unspecified: Secondary | ICD-10-CM

## 2015-08-29 DIAGNOSIS — Q211 Atrial septal defect: Secondary | ICD-10-CM

## 2015-08-29 DIAGNOSIS — I259 Chronic ischemic heart disease, unspecified: Secondary | ICD-10-CM

## 2015-08-29 DIAGNOSIS — Q2111 Secundum atrial septal defect: Secondary | ICD-10-CM

## 2015-08-29 DIAGNOSIS — I25810 Atherosclerosis of coronary artery bypass graft(s) without angina pectoris: Secondary | ICD-10-CM

## 2015-08-29 DIAGNOSIS — Z01818 Encounter for other preprocedural examination: Secondary | ICD-10-CM | POA: Diagnosis not present

## 2015-08-29 NOTE — Progress Notes (Signed)
Cardiology Office Note   Date:  08/29/2015   ID:  Jason Noble, DOB 31-May-1941, MRN [id number]  PCP:  Shirline Frees, MD  Cardiologist: Dr. Mare Ferrari  Chief Complaint: Preop evaluation    History of Present Illness: Jason Noble is a 74 y.o. male who presents for preop evaluation before undergoing hernia repair. Patient has history of CAD status post CABG 3 in 44. In 2008 he had stenting of an SVG. 2009 he had a mild stroke from a tiny PFO, surgical closure was not recommended. 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 He was reluctant to have any additional radiation to his chest area because of the radiation he is already encountered with this cancer. He had an echo in 2014 EF 50-55% and his aortic root was 4.4 cm. Most recent 2-D echo 01/13/15 EF improved to 55-60% with severe focal basal septal hypertrophy. Ascending aorta measures 4.5 cm. He also had mild TR and mild pulmonary hypertension. He has also had tongue cancer treated with chemotherapy and radiation.  Overall patient is doing well without cardiac complaints. He is not exercising much because he is also having left hip pain. He can walk several thousand feet daily without problem. He denies chest pain, palpitations, dyspnea, dyspnea on exertion, dizziness or presyncope. Sometimes he has balance issues related to his hip pain. His inguinal hernia is bothering him more and he seems to have more trouble reducing it. Surgery is scheduled for January 4.    Past Medical History  Diagnosis Date  . Cholecystitis   . Abscess of gallbladder   . CVA (cerebral infarction) 2007    "mild"  . 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 Skin cancer     basal cell, squamous cell  . Prostate cancer ([name]) 2002  . Basal cell carcinoma of skin   . Squamous cell carcinoma of skin   . Coronary heart disease     has a stent  . History of radiation therapy 06/18/2011-  08/03/2011    squamous cell tongue/69.96 Gy 16fx  . Fatigue 07/26/2011  . Myocardial infarction ([place])   . PFO (patent foramen ovale)   . Hypercholesteremia     under control  . History of cancer chemotherapy     Past Surgical History  Procedure Laterality Date  . Prostatectomy  2002  . Peg tube placement  06/13/11 at [facility] IR    10/2011 removed  . Multiple tooth extractions      prior to radiation  . 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 Elbow surgery  42 years ago    removal left radial head due to hockey accident  . Cholecystectomy  04/2009    3 surgeries involved: lap chole x2 laparotomy  . Exploratory laparotomy  04/2010    Dr. Redmond Pulling (3rd surgery for gall bladder)  . Tonsillectomy      x2: as child then 1985 for Left lingual tonsil removed  . Inguinal hernia repair Right 09/23/2014    Procedure: RIGHT INGUINAL HERNIA REPAIR WITH MESH;  Surgeon: Jackolyn Confer, MD;  Location: [facility];  Service: General;  Laterality: Right;     Current Outpatient Prescriptions  Medication Sig Dispense Refill  . atorvastatin (LIPITOR) 40 MG tablet Take 1 tablet 5 times a week 70 tablet 3  . calcium carbonate (OS-CAL) 600 MG TABS Take 1,200 mg by mouth every evening.     Marland Kitchen  cholecalciferol (VITAMIN D) 1000 UNITS tablet Take 1,000 Units by mouth daily.    . fluorouracil (EFUDEX) 5 % cream Apply 5 Act topically daily as needed (RASH).     Marland Kitchen L-Glutamine 500 MG TABS Take 1 tablet by mouth every evening.    . Lecithin 1200 MG CAPS Take 1 capsule by mouth daily.    . Misc Natural Products (LUTEIN 20 PO) Take 1 tablet by mouth daily.    . Misc Natural Products (OSTEO BI-FLEX JOINT SHIELD PO) Take 1 tablet by mouth every evening. Pt not sure of dosage.    . Multiple Vitamin (MULTIVITAMIN WITH MINERALS) TABS tablet Take 1 tablet by mouth daily.    . niacinamide 500 MG tablet Take 500 mg by mouth every evening.    . pyridOXINE (VITAMIN B-6) 50 MG tablet Take 50 mg by mouth daily.    . Sodium  Fluoride 1.1 % PSTE Apply thin ribbon of cream to toothbrush. Brush teeth for 2 minutes at bedtime. Spit out excess. Do not swallow. Either this or fluorishield    . tretinoin (RETIN-A) 0.025 % cream Apply 1 application topically at bedtime. As Directed    . Turmeric 450 MG CAPS Take 1 capsule by mouth daily.    Marland Kitchen UBIQUINOL PO Take 100 mg by mouth daily.     Marland Kitchen VITAMIN A PO Take 1 tablet by mouth daily.    . vitamin C (ASCORBIC ACID) 500 MG tablet Take 500 mg by mouth daily.    Marland Kitchen zinc gluconate 50 MG tablet Take 25 mg by mouth daily.    Marland Kitchen aspirin EC 325 MG EC tablet Take 325 mg by mouth every morning.     . fish oil-omega-3 fatty acids 1000 MG capsule Take 1 g by mouth daily.     Marland Kitchen gabapentin (NEURONTIN) 300 MG capsule Take 300 mg by mouth 2 (two) times daily.     . vitamin E 400 UNIT capsule Take 400 Units by mouth every morning.      No current facility-administered medications for this visit.   Facility-Administered Medications Ordered in Other Visits  Medication Dose Route Frequency Provider Last Rate Last Dose  . topical emolient (BIAFINE) emulsion   Topical BID Kyung Rudd, MD        Allergies:   Review of patient's allergies indicates no known allergies.    Social History:  The patient  reports that he quit smoking about 49 years ago. His smoking use included Cigarettes. He has never used smokeless tobacco. He reports that he does not drink alcohol or use illicit drugs.   Family History:  The patient's   family history includes Coronary artery disease in his brother; Stroke in his mother.    ROS:  Please see the history of present illness.   Otherwise, review of systems are positive for none.   All other systems are reviewed and negative.    PHYSICAL EXAM: VS:  BP 140/80 mmHg  Pulse 59  Ht 5\' 9"  (1.753 m)  Wt 178 lb (80.74 kg)  BMI 26.27 kg/m2 , BMI Body mass index is 26.27 kg/(m^2). GEN: Well nourished, well developed, in no acute distress Neck: no JVD, HJR, carotid bruits,  or masses Cardiac: RRR; 1 to 2/6 systolic murmur at the left sternal border, no gallop, rubs, thrill or heave,  Respiratory:  clear to auscultation bilaterally, normal work of breathing GI: soft, nontender, nondistended, + BS MS: no deformity or atrophy Extremities: without cyanosis, clubbing, edema, good distal pulses bilaterally.  Skin: warm and dry, no rash Neuro:  Strength and sensation are intact    EKG:  EKG is ordered today. The ekg ordered today demonstrates sinus bradycardia 59 bpm otherwise normal EKG   Recent Labs: 09/20/2014: Hemoglobin 14.1; Platelets 187 07/05/2015: ALT 15; BUN 24; Creat 0.95; Potassium 4.0; Sodium 140    Lipid Panel    Component Value Date/Time   CHOL 164 07/05/2015 0923   TRIG 52 07/05/2015 0923   HDL 57 07/05/2015 0923   CHOLHDL 2.9 07/05/2015 0923   VLDL 10 07/05/2015 0923   LDLCALC 97 07/05/2015 0923   LDLDIRECT 168.1 12/15/2012 1539      Wt Readings from Last 3 Encounters:  08/29/15 178 lb (80.74 kg)  03/30/15 177 lb 1.6 oz (80.332 kg)  01/10/15 176 lb 12.8 oz (80.196 kg)      Other studies Reviewed: Additional studies/ records that were reviewed today include and review of the records demonstrates: 2-D echo 01/13/15 Study Conclusions  - Left ventricle: The cavity size was normal. Wall thickness was   increased in a pattern of mild LVH. There was severe focal basal   hypertrophy of the septum. Systolic function was normal. The   estimated ejection fraction was in the range of 55% to 60%. Wall   motion was normal; there were no regional wall motion   abnormalities. Doppler parameters are consistent with abnormal   left ventricular relaxation (grade 1 diastolic dysfunction). The   E/e&' ratio is <8, suggesting normal LV filling pressure. - Aortic valve: Trileaflet. Sclerosis without stenosis. There was   no regurgitation. - Aorta: Aortic root dimension: 45 mm (ED). - Ascending aorta: The ascending aorta was dilated. - Mitral  valve: Calcified annulus. Mildly thickened leaflets .   There was trivial regurgitation. - Left atrium: The atrium was at the upper limits of normal in   size. - Right atrium: The atrium was mildly dilated. - Atrial septum: Aneurysmal - PFO cannot be excluded. - Tricuspid valve: There was mild regurgitation. - Pulmonary arteries: PA peak pressure: 44 mm Hg (S). - Inferior vena cava: The vessel was normal in size. The   respirophasic diameter changes were in the normal range (= 50%),   consistent with normal central venous pressure.  Impressions:  - Compared to the prior study in 2014, EF is improved to 55-60%,   there is now severe focal basal septal hypertrophy. Aortic valve   sclerosis. The ascending aorta measures up to 4.5 cm at the most   distally visualized point. There is mild TR with mild pulmonary   hypertension, RVSP 44 mmHg.    ASSESSMENT AND PLAN:  Preoperative clearance Patient is here for preoperative clearance before undergoing left hernia repair. He had right hernia repair last year. He did well. He has CAD status post prior CABG in 1999 and subsequent stenting 10 SVG in 2009. He has no cardiac symptoms. EF 55-60%. He also has an ascending aorta is dilated at 4.5 cm on recent echo in 12/2014. This has been stable. He can walk thousand of feet every day without difficulty. I discussed this patient in detail with Dr. Mare Ferrari who says the patient can proceed with left hernia repair and left hip surgery without further cardiac workup.  CAD, ARTERY BYPASS GRAFT Patient is stable without cardiac complaints. Continue aspirin, Lipitor and niacin. Follow-up with Dr. Mare Ferrari in 3 months  PATENT FORAMEN OVALE Surgery not recommended  Aneurysm of thoracic aorta (HCC) This measures at 4.5 cm on most recent 2-D  echo in 12/2014 which is not significantly changed since 2014 echo. Patient does not want to have a CT because of all of radiation he has had. Continue to follow with  echo.  HYPERCHOLESTEROLEMIA Lipid profile in October 2016 was normal    Signed, Ermalinda Barrios, PA-C  08/29/2015 1:11 PM    [facility] [address], [place], [place]  [id number] Phone: [phone]; Fax: [phone]

## 2015-08-29 NOTE — Patient Instructions (Signed)
Medication Instructions:  Your physician recommends that you continue on your current medications as directed. Please refer to the Current Medication list given to you today.  Labwork: NONE ORDERED  Testing/Procedures: NONE ORDERED  Follow-Up: Your physician recommends that you schedule a follow-up appointment in: BY END OF MARCH 2017, TO SEE DR. BRACKBILL   Any Other Special Instructions Will Be Listed Below (If Applicable). You have been cleared for surgery on a cardiac stand point of view.  Dr.Rosenbowers office will be notified.   If you need a refill on your cardiac medications before your next appointment, please call your pharmacy.

## 2015-08-29 NOTE — Assessment & Plan Note (Signed)
Patient is stable without cardiac complaints. Continue aspirin, Lipitor and niacin. Follow-up with Dr. Mare Ferrari in 3 months

## 2015-08-29 NOTE — Assessment & Plan Note (Signed)
Patient is here for preoperative clearance before undergoing left hernia repair. He had right hernia repair last year. He did well. He has CAD status post prior CABG in 1999 and subsequent stenting 10 SVG in 2009. He has no cardiac symptoms. EF 55-60%. He also has an ascending aorta is dilated at 4.5 cm on recent echo in 12/2014. This has been stable. He can walk thousand of feet every day without difficulty. I discussed this patient in detail with Dr. Mare Ferrari who says the patient can proceed with left hernia repair and left hip surgery without further cardiac workup.

## 2015-08-29 NOTE — Assessment & Plan Note (Signed)
Lipid profile in October 2016 was normal

## 2015-08-29 NOTE — Assessment & Plan Note (Signed)
Surgery not recommended

## 2015-08-29 NOTE — Assessment & Plan Note (Signed)
This measures at 4.5 cm on most recent 2-D echo in 12/2014 which is not significantly changed since 2014 echo. Patient does not want to have a CT because of all of radiation he has had. Continue to follow with echo.

## 2015-08-30 ENCOUNTER — Other Ambulatory Visit: Payer: Self-pay | Admitting: General Surgery

## 2015-09-21 ENCOUNTER — Encounter (HOSPITAL_COMMUNITY)
Admission: RE | Admit: 2015-09-21 | Discharge: 2015-09-21 | Disposition: A | Discharge status: Home / Self Care | Payer: Medicare Other | Source: Ambulatory Visit | Attending: General Surgery | Admitting: General Surgery

## 2015-09-21 ENCOUNTER — Encounter (HOSPITAL_COMMUNITY): Payer: Self-pay

## 2015-09-21 DIAGNOSIS — K4090 Unilateral inguinal hernia, without obstruction or gangrene, not specified as recurrent: Secondary | ICD-10-CM | POA: Insufficient documentation

## 2015-09-21 DIAGNOSIS — Z01818 Encounter for other preprocedural examination: Secondary | ICD-10-CM | POA: Diagnosis not present

## 2015-09-21 HISTORY — DX: Anesthesia of skin: R20.0

## 2015-09-21 HISTORY — DX: Thoracic aortic aneurysm, without rupture: I71.2

## 2015-09-21 HISTORY — DX: Fracture of mandible, unspecified, initial encounter for closed fracture: S02.609A

## 2015-09-21 HISTORY — DX: Cerebral infarction, unspecified: I63.9

## 2015-09-21 HISTORY — DX: Thoracic aortic aneurysm, without rupture, unspecified: I71.20

## 2015-09-21 HISTORY — DX: Peripheral vascular disease, unspecified: I73.9

## 2015-09-21 LAB — CBC WITH DIFFERENTIAL/PLATELET
Basophils Absolute: 0 10*3/uL (ref 0.0–0.1)
Basophils Relative: 0 %
Eosinophils Absolute: 0.4 10*3/uL (ref 0.0–0.7)
Eosinophils Relative: 8 %
HCT: 47 % (ref 39.0–52.0)
Hemoglobin: 15.6 g/dL (ref 13.0–17.0)
Lymphocytes Relative: 30 %
Lymphs Abs: 1.6 10*3/uL (ref 0.7–4.0)
MCH: 30.9 pg (ref 26.0–34.0)
MCHC: 33.2 g/dL (ref 30.0–36.0)
MCV: 93.1 fL (ref 78.0–100.0)
Monocytes Absolute: 0.5 10*3/uL (ref 0.1–1.0)
Monocytes Relative: 9 %
Neutro Abs: 2.8 10*3/uL (ref 1.7–7.7)
Neutrophils Relative %: 53 %
Platelets: 173 10*3/uL (ref 150–400)
RBC: 5.05 MIL/uL (ref 4.22–5.81)
RDW: 13.3 % (ref 11.5–15.5)
WBC: 5.4 10*3/uL (ref 4.0–10.5)

## 2015-09-21 LAB — COMPREHENSIVE METABOLIC PANEL
ALT: 19 U/L (ref 17–63)
AST: 26 U/L (ref 15–41)
Albumin: 4.2 g/dL (ref 3.5–5.0)
Alkaline Phosphatase: 75 U/L (ref 38–126)
Anion gap: 7 (ref 5–15)
BUN: 27 mg/dL — ABNORMAL HIGH (ref 6–20)
CO2: 29 mmol/L (ref 22–32)
Calcium: 9.5 mg/dL (ref 8.9–10.3)
Chloride: 104 mmol/L (ref 101–111)
Creatinine, Ser: 1.13 mg/dL (ref 0.61–1.24)
GFR calc Af Amer: >60 mL/min (ref >60)
GFR calc non Af Amer: >60 mL/min (ref >60)
Glucose, Bld: 122 mg/dL — ABNORMAL HIGH (ref 65–99)
Potassium: 5.5 mmol/L — ABNORMAL HIGH (ref 3.5–5.1)
Sodium: 140 mmol/L (ref 135–145)
Total Bilirubin: 1.1 mg/dL (ref 0.3–1.2)
Total Protein: 6.8 g/dL (ref 6.5–8.1)

## 2015-09-21 LAB — PROTIME-INR
INR: 0.97 (ref 0.00–1.49)
Prothrombin Time: 13.1 seconds (ref 11.6–15.2)

## 2015-09-21 NOTE — Progress Notes (Signed)
EKG epic 08/29/2015 ECHO epic 01/13/2015 Clearance / epic per Dr Mare Ferrari cardiology 08/29/2015

## 2015-09-21 NOTE — Patient Instructions (Signed)
Jason Noble  09/21/2015   Your procedure is scheduled on: Friday September 30, 2015   Report to [facility] Main  Entrance take [place]  elevators to 3rd floor to  [place] at 5:30 AM.  Call this number if you have problems the morning of surgery [phone]   Remember: ONLY 1 PERSON MAY GO WITH YOU TO SHORT STAY TO GET  READY MORNING OF [place].  Do not eat food or drink liquids :After Midnight.     Take these medicines the morning of surgery with A SIP OF WATER: NONE                                You may not have any metal on your body including hair pins and              piercings  Do not wear jewelry,  lotions, powders or colognes, deodorant                           Men may shave face and neck.   Do not bring valuables to the hospital. [place].  Contacts, dentures or bridgework may not be worn into surgery.     Patients discharged the day of surgery will not be allowed to drive home.  Name and phone number of your driver:Kathy Lamar (wife)  _____________________________________________________________________             [facility] - Preparing for Surgery Before surgery, you can play an important role.  Because skin is not sterile, your skin needs to be as free of germs as possible.  You can reduce the number of germs on your skin by washing with CHG (chlorahexidine gluconate) soap before surgery.  CHG is an antiseptic cleaner which kills germs and bonds with the skin to continue killing germs even after washing. Please DO NOT use if you have an allergy to CHG or antibacterial soaps.  If your skin becomes reddened/irritated stop using the CHG and inform your nurse when you arrive at Short Stay. Do not shave (including legs and underarms) for at least 48 hours prior to the first CHG shower.  You may shave your face/neck. Please follow these instructions carefully:  1.  Shower with  CHG Soap the night before surgery and the  morning of Surgery.  2.  If you choose to wash your hair, wash your hair first as usual with your  normal  shampoo.  3.  After you shampoo, rinse your hair and body thoroughly to remove the  shampoo.                           4.  Use CHG as you would any other liquid soap.  You can apply chg directly  to the skin and wash                       Gently with a scrungie or clean washcloth.  5.  Apply the CHG Soap to your body ONLY FROM THE NECK DOWN.   Do not use on face/ open  Wound or open sores. Avoid contact with eyes, ears mouth and genitals (private parts).                       Wash face,  Genitals (private parts) with your normal soap.             6.  Wash thoroughly, paying special attention to the area where your surgery  will be performed.  7.  Thoroughly rinse your body with warm water from the neck down.  8.  DO NOT shower/wash with your normal soap after using and rinsing off  the CHG Soap.                9.  Pat yourself dry with a clean towel.            10.  Wear clean pajamas.            11.  Place clean sheets on your bed the night of your first shower and do not  sleep with pets. Day of Surgery : Do not apply any lotions/deodorants the morning of surgery.  Please wear clean clothes to the hospital/surgery center.  FAILURE TO FOLLOW THESE INSTRUCTIONS MAY RESULT IN THE CANCELLATION OF YOUR SURGERY PATIENT SIGNATURE_________________________________  NURSE SIGNATURE__________________________________  ________________________________________________________________________

## 2015-09-21 NOTE — Progress Notes (Signed)
CMP results in epic per PAT visit 09/21/2015 sent to Dr Zella Richer

## 2015-09-30 ENCOUNTER — Ambulatory Visit (HOSPITAL_COMMUNITY)
Admission: RE | Admit: 2015-09-30 | Discharge: 2015-09-30 | Disposition: A | Discharge status: Home / Self Care | Payer: Medicare Other | Source: Ambulatory Visit | Attending: General Surgery | Admitting: General Surgery

## 2015-09-30 ENCOUNTER — Ambulatory Visit (HOSPITAL_COMMUNITY): Payer: Medicare Other | Admitting: Anesthesiology

## 2015-09-30 ENCOUNTER — Encounter (HOSPITAL_COMMUNITY): Payer: Self-pay | Admitting: *Deleted

## 2015-09-30 ENCOUNTER — Encounter (HOSPITAL_COMMUNITY)
Admission: RE | Disposition: A | Discharge status: Home / Self Care | Payer: No Typology Code available for payment source | Source: Ambulatory Visit | Attending: General Surgery

## 2015-09-30 DIAGNOSIS — Z8673 Personal history of transient ischemic attack (TIA), and cerebral infarction without residual deficits: Secondary | ICD-10-CM | POA: Insufficient documentation

## 2015-09-30 DIAGNOSIS — Z79899 Other long term (current) drug therapy: Secondary | ICD-10-CM | POA: Insufficient documentation

## 2015-09-30 DIAGNOSIS — K4090 Unilateral inguinal hernia, without obstruction or gangrene, not specified as recurrent: Secondary | ICD-10-CM | POA: Insufficient documentation

## 2015-09-30 DIAGNOSIS — I252 Old myocardial infarction: Secondary | ICD-10-CM | POA: Diagnosis not present

## 2015-09-30 DIAGNOSIS — Z7982 Long term (current) use of aspirin: Secondary | ICD-10-CM | POA: Diagnosis not present

## 2015-09-30 DIAGNOSIS — Z951 Presence of aortocoronary bypass graft: Secondary | ICD-10-CM | POA: Diagnosis not present

## 2015-09-30 DIAGNOSIS — Z87891 Personal history of nicotine dependence: Secondary | ICD-10-CM | POA: Insufficient documentation

## 2015-09-30 DIAGNOSIS — Z923 Personal history of irradiation: Secondary | ICD-10-CM | POA: Insufficient documentation

## 2015-09-30 DIAGNOSIS — L821 Other seborrheic keratosis: Secondary | ICD-10-CM | POA: Insufficient documentation

## 2015-09-30 DIAGNOSIS — E7800 Pure hypercholesterolemia, unspecified: Secondary | ICD-10-CM | POA: Insufficient documentation

## 2015-09-30 DIAGNOSIS — I2510 Atherosclerotic heart disease of native coronary artery without angina pectoris: Secondary | ICD-10-CM | POA: Diagnosis not present

## 2015-09-30 DIAGNOSIS — I712 Thoracic aortic aneurysm, without rupture: Secondary | ICD-10-CM | POA: Insufficient documentation

## 2015-09-30 DIAGNOSIS — I739 Peripheral vascular disease, unspecified: Secondary | ICD-10-CM | POA: Diagnosis not present

## 2015-09-30 DIAGNOSIS — Z9221 Personal history of antineoplastic chemotherapy: Secondary | ICD-10-CM | POA: Insufficient documentation

## 2015-09-30 DIAGNOSIS — I25810 Atherosclerosis of coronary artery bypass graft(s) without angina pectoris: Secondary | ICD-10-CM | POA: Diagnosis not present

## 2015-09-30 HISTORY — PX: INGUINAL HERNIA REPAIR: SHX194

## 2015-09-30 HISTORY — PX: INSERTION OF MESH: SHX5868

## 2015-09-30 SURGERY — REPAIR, HERNIA, INGUINAL, ADULT
Anesthesia: General | Laterality: Left

## 2015-09-30 MED ORDER — BUPIVACAINE-EPINEPHRINE (PF) 0.5% -1:200000 IJ SOLN
INTRAMUSCULAR | Status: AC
  Filled 2015-09-30: qty 30

## 2015-09-30 MED ORDER — EPHEDRINE SULFATE 50 MG/ML IJ SOLN
INTRAMUSCULAR | Status: DC | PRN
  Administered 2015-09-30 (×2): 5 mg via INTRAVENOUS
  Administered 2015-09-30: 10 mg via INTRAVENOUS

## 2015-09-30 MED ORDER — EPHEDRINE SULFATE 50 MG/ML IJ SOLN
INTRAMUSCULAR | Status: AC
  Filled 2015-09-30: qty 1

## 2015-09-30 MED ORDER — ONDANSETRON HCL 4 MG/2ML IJ SOLN: 4.0000 mg | Freq: Once | INTRAMUSCULAR | Status: DC | PRN

## 2015-09-30 MED ORDER — LIDOCAINE HCL (CARDIAC) 20 MG/ML IV SOLN
INTRAVENOUS | Status: DC | PRN
  Administered 2015-09-30: 60 mg via INTRATRACHEAL

## 2015-09-30 MED ORDER — OXYCODONE HCL 5 MG PO TABS: 5.0000 mg | ORAL_TABLET | ORAL | Status: DC | PRN

## 2015-09-30 MED ORDER — ONDANSETRON HCL 4 MG/2ML IJ SOLN
INTRAMUSCULAR | Status: DC | PRN
  Administered 2015-09-30: 4 mg via INTRAVENOUS

## 2015-09-30 MED ORDER — ONDANSETRON HCL 4 MG/2ML IJ SOLN
INTRAMUSCULAR | Status: AC
  Filled 2015-09-30: qty 2

## 2015-09-30 MED ORDER — 0.9 % SODIUM CHLORIDE (POUR BTL) OPTIME
TOPICAL | Status: DC | PRN
  Administered 2015-09-30: 1000 mL

## 2015-09-30 MED ORDER — HYDROCODONE-ACETAMINOPHEN 7.5-325 MG PO TABS: 1.0000 | ORAL_TABLET | Freq: Once | ORAL | Status: DC | PRN

## 2015-09-30 MED ORDER — DEXAMETHASONE SODIUM PHOSPHATE 10 MG/ML IJ SOLN
INTRAMUSCULAR | Status: AC
  Filled 2015-09-30: qty 1

## 2015-09-30 MED ORDER — MORPHINE SULFATE (PF) 10 MG/ML IV SOLN: 2.0000 mg | INTRAVENOUS | Status: DC | PRN

## 2015-09-30 MED ORDER — CEFAZOLIN SODIUM-DEXTROSE 2-3 GM-% IV SOLR
INTRAVENOUS | Status: AC
  Filled 2015-09-30: qty 50

## 2015-09-30 MED ORDER — ROCURONIUM BROMIDE 100 MG/10ML IV SOLN
INTRAVENOUS | Status: AC
  Filled 2015-09-30: qty 1

## 2015-09-30 MED ORDER — LIDOCAINE HCL (CARDIAC) 20 MG/ML IV SOLN
INTRAVENOUS | Status: AC
  Filled 2015-09-30: qty 5

## 2015-09-30 MED ORDER — CEFAZOLIN SODIUM-DEXTROSE 2-3 GM-% IV SOLR
2.0000 g | INTRAVENOUS | Status: AC
  Administered 2015-09-30: 2 g via INTRAVENOUS

## 2015-09-30 MED ORDER — LACTATED RINGERS IV SOLN
INTRAVENOUS | Status: DC | PRN
  Administered 2015-09-30: 07:00:00 via INTRAVENOUS

## 2015-09-30 MED ORDER — FENTANYL CITRATE (PF) 250 MCG/5ML IJ SOLN
INTRAMUSCULAR | Status: DC | PRN
  Administered 2015-09-30 (×2): 25 ug via INTRAVENOUS

## 2015-09-30 MED ORDER — ACETAMINOPHEN 650 MG RE SUPP
650.0000 mg | RECTAL | Status: DC | PRN
  Filled 2015-09-30: qty 1

## 2015-09-30 MED ORDER — FENTANYL CITRATE (PF) 100 MCG/2ML IJ SOLN: 25.0000 ug | INTRAMUSCULAR | Status: DC | PRN

## 2015-09-30 MED ORDER — PROPOFOL 10 MG/ML IV BOLUS
INTRAVENOUS | Status: DC | PRN
  Administered 2015-09-30: 100 mg via INTRAVENOUS
  Administered 2015-09-30: 50 mg via INTRAVENOUS

## 2015-09-30 MED ORDER — FENTANYL CITRATE (PF) 100 MCG/2ML IJ SOLN
INTRAMUSCULAR | Status: AC
  Filled 2015-09-30: qty 2

## 2015-09-30 MED ORDER — ACETAMINOPHEN 325 MG PO TABS: 650.0000 mg | ORAL_TABLET | ORAL | Status: DC | PRN

## 2015-09-30 MED ORDER — BUPIVACAINE-EPINEPHRINE 0.5% -1:200000 IJ SOLN
INTRAMUSCULAR | Status: DC | PRN
  Administered 2015-09-30: 30 mL

## 2015-09-30 MED ORDER — LACTATED RINGERS IV SOLN: INTRAVENOUS | Status: DC

## 2015-09-30 MED ORDER — DEXAMETHASONE SODIUM PHOSPHATE 10 MG/ML IJ SOLN
INTRAMUSCULAR | Status: DC | PRN
  Administered 2015-09-30 (×2): 10 mg via INTRAVENOUS

## 2015-09-30 MED ORDER — SODIUM CHLORIDE 0.9 % IJ SOLN: 3.0000 mL | INTRAMUSCULAR | Status: DC | PRN

## 2015-09-30 MED ORDER — PROPOFOL 10 MG/ML IV BOLUS
INTRAVENOUS | Status: AC
  Filled 2015-09-30: qty 20

## 2015-09-30 SURGICAL SUPPLY — 39 items
APL SKNCLS STERI-STRIP NONHPOA (GAUZE/BANDAGES/DRESSINGS) ×1
BENZOIN TINCTURE PRP APPL 2/3 (GAUZE/BANDAGES/DRESSINGS) ×2 IMPLANT
BLADE HEX COATED 2.75 (ELECTRODE) ×2 IMPLANT
BLADE SURG 15 STRL LF DISP TIS (BLADE) ×1 IMPLANT
BLADE SURG 15 STRL SS (BLADE) ×2
COVER SURGICAL LIGHT HANDLE (MISCELLANEOUS) ×2 IMPLANT
DECANTER SPIKE VIAL GLASS SM (MISCELLANEOUS) ×1 IMPLANT
DRAIN PENROSE 18X1/2 LTX STRL (DRAIN) ×2 IMPLANT
DRAPE INCISE IOBAN 66X45 STRL (DRAPES) ×2 IMPLANT
DRAPE LAPAROTOMY TRNSV 102X78 (DRAPE) ×2 IMPLANT
DRSG TEGADERM 4X4.75 (GAUZE/BANDAGES/DRESSINGS) ×1 IMPLANT
ELECT PENCIL ROCKER SW 15FT (MISCELLANEOUS) ×2 IMPLANT
ELECT REM PT RETURN 9FT ADLT (ELECTROSURGICAL) ×2
ELECTRODE REM PT RTRN 9FT ADLT (ELECTROSURGICAL) ×1 IMPLANT
GAUZE SPONGE 4X4 12PLY STRL (GAUZE/BANDAGES/DRESSINGS) ×1 IMPLANT
GLOVE ECLIPSE 8.0 STRL XLNG CF (GLOVE) ×2 IMPLANT
GLOVE INDICATOR 8.0 STRL GRN (GLOVE) ×2 IMPLANT
GOWN STRL REUS W/TWL XL LVL3 (GOWN DISPOSABLE) ×4 IMPLANT
KIT BASIN OR (CUSTOM PROCEDURE TRAY) ×2 IMPLANT
MESH HERNIA 3X6 (Mesh General) ×1 IMPLANT
NDL HYPO 25X1 1.5 SAFETY (NEEDLE) ×1 IMPLANT
NEEDLE HYPO 25X1 1.5 SAFETY (NEEDLE) ×2 IMPLANT
NS IRRIG 1000ML POUR BTL (IV SOLUTION) ×2 IMPLANT
PACK BASIC VI WITH GOWN DISP (CUSTOM PROCEDURE TRAY) ×2 IMPLANT
SPONGE LAP 4X18 X RAY DECT (DISPOSABLE) ×2 IMPLANT
STRIP CLOSURE SKIN 1/2X4 (GAUZE/BANDAGES/DRESSINGS) ×2 IMPLANT
SUT MNCRL AB 3-0 PS2 18 (SUTURE) ×1 IMPLANT
SUT MNCRL AB 4-0 PS2 18 (SUTURE) ×2 IMPLANT
SUT PROLENE 2 0 CT2 30 (SUTURE) ×4 IMPLANT
SUT VIC AB 2-0 SH 18 (SUTURE) ×2 IMPLANT
SUT VIC AB 2-0 SH 27 (SUTURE) ×2
SUT VIC AB 2-0 SH 27X BRD (SUTURE) IMPLANT
SUT VIC AB 3-0 SH 27 (SUTURE) ×2
SUT VIC AB 3-0 SH 27XBRD (SUTURE) ×1 IMPLANT
SYR 20CC LL (SYRINGE) ×1 IMPLANT
SYR BULB IRRIGATION 50ML (SYRINGE) ×2 IMPLANT
TOWEL OR 17X26 10 PK STRL BLUE (TOWEL DISPOSABLE) ×2 IMPLANT
TOWEL OR NON WOVEN STRL DISP B (DISPOSABLE) ×2 IMPLANT
YANKAUER SUCT BULB TIP 10FT TU (MISCELLANEOUS) ×1 IMPLANT

## 2015-09-30 NOTE — Discharge Instructions (Addendum)
[facility] _______[facility] ° °UMBILICAL OR INGUINAL HERNIA REPAIR: POST OP INSTRUCTIONS ° °Always review your discharge instruction sheet given to you by the facility where your surgery was performed. °IF YOU HAVE DISABILITY OR FAMILY LEAVE FORMS, YOU MUST BRING THEM TO THE OFFICE FOR PROCESSING.   °DO NOT GIVE THEM TO YOUR DOCTOR. ° °1. A  prescription for pain medication may be given to you upon discharge.  Take your pain medication as prescribed, if needed.  If narcotic pain medicine is not needed, then you may take acetaminophen (Tylenol) or ibuprofen (Advil) as needed. °2. Take your usually prescribed medications unless otherwise directed. °3. If you need a refill on your pain medication, please contact your pharmacy.  They will contact our office to request authorization. Prescriptions will not be filled after 5 pm or on week-ends. °4. You should follow a light diet the first 24 hours after arrival home, such as soup and crackers, etc.  Be sure to include lots of fluids daily.  Resume your normal diet the day after surgery. °5. Most patients will experience some swelling and bruising around the umbilicus or in the groin and scrotum.  Ice packs and reclining will help.  Swelling and bruising can take several days to resolve.  °6. It is common to experience some constipation if taking pain medication after surgery.  Increasing fluid intake and taking a stool softener (such as Colace) will usually help or prevent this problem from occurring.  A mild laxative (Milk of Magnesia or Miralax) should be taken according to package directions if there are no bowel movements after 48 hours. °7. Unless discharge instructions indicate otherwise, you may remove your bandages 72 hours after surgery, and you may shower at that time.  You may have steri-strips (small skin tapes) in place directly over the incision.  These strips should be left on the skin.  If your surgeon used skin glue on the incision, you may  shower in 24 hours.  The glue will flake off over the next 2-3 weeks.  Any sutures or staples will be removed at the office during your follow-up visit. °8. ACTIVITIES:  You may resume regular (light) daily activities beginning the next day--such as daily self-care, walking, climbing stairs--gradually increasing activities as tolerated.  You may have sexual intercourse when it is comfortable.  Refrain from any heavy lifting or straining-nothing over 10 pounds for 6 weeks. °a. You may drive when you are no longer taking prescription pain medication, you can comfortably wear a seatbelt, and you can safely maneuver your car and apply brakes. °b. RETURN TO WORK:  __________________________________________________________ °9. You should see your doctor in the office for a follow-up appointment approximately 2-3 weeks after your surgery.  Make sure that you call for this appointment within a day or two after you arrive home to insure a convenient appointment time. °10. OTHER INSTRUCTIONS:  __________________________________________________________________________________________________________________________________________________________________________________________  °WHEN TO CALL YOUR DOCTOR: °1. Fever over 101.0 °2. Inability to urinate °3. Nausea and/or vomiting °4. Extreme swelling or bruising °5. Continued bleeding from incision. °6. Increased pain, redness, or drainage from the incision ° °The clinic staff is available to answer your questions during regular business hours.  Please don’t hesitate to call and ask to speak to one of the nurses for clinical concerns.  If you have a medical emergency, go to the nearest emergency room or call 911.  A surgeon from [facility] is always on call at the hospital ° ° °[address], [address], [place], [place]  [address] ? ° [address] [address], [place], [place]   [address] °[phone] ? [phone] ? FAX [phone] °Web site:  [email or web address] ° ° ° ° °General Anesthesia, Adult, Care After °Refer to this sheet in the next few weeks. These instructions provide you with information on caring for yourself after your procedure. Your health care provider may also give you more specific instructions. Your treatment has been planned according to current medical practices, but problems sometimes occur. Call your health care provider if you have any problems or questions after your procedure. °WHAT TO EXPECT AFTER THE PROCEDURE °After the procedure, it is typical to experience: °· Sleepiness. °· Nausea and vomiting. °HOME CARE INSTRUCTIONS °· For the first 24 hours after general anesthesia: °¨ Have a responsible person with you. °¨ Do not drive a car. If you are alone, do not take public transportation. °¨ Do not drink alcohol. °¨ Do not take medicine that has not been prescribed by your health care provider. °¨ Do not sign important papers or make important decisions. °¨ You may resume a normal diet and activities as directed by your health care provider. °· Change bandages (dressings) as directed. °· If you have questions or problems that seem related to general anesthesia, call the hospital and ask for the anesthetist or anesthesiologist on call. °SEEK MEDICAL CARE IF: °· You have nausea and vomiting that continue the day after anesthesia. °· You develop a rash. °SEEK IMMEDIATE MEDICAL CARE IF:  °· You have difficulty breathing. °· You have chest pain. °· You have any allergic problems. °  °This information is not intended to replace advice given to you by your health care provider. Make sure you discuss any questions you have with your health care provider. °  °Document Released: 12/10/2000 Document Revised: 09/24/2014 Document Reviewed: 01/02/2012 °Elsevier Interactive Patient Education ©2016 [employer] Inc[employer] ° °

## 2015-09-30 NOTE — H&P (Signed)
Jason Noble is an 75 y.o. male.   Chief Complaint:   Here for [facility] repair HPI:   He has a left inguinal hernia that he manually reduces.  He wears a truss at times as well.  He presents for elective repair.  Past Medical History  Diagnosis Date  . Cholecystitis   . Abscess of gallbladder   . CVA (cerebral infarction) 2007    "mild"  . 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 Skin cancer     basal cell, squamous cell  . Prostate cancer ([place]) 2002  . Basal cell carcinoma of skin   . Squamous cell carcinoma of skin   . Coronary heart disease     has a stent  . History of radiation therapy 06/18/2011- 08/03/2011    squamous cell tongue/69.96 Gy 43fx  . Fatigue 07/26/2011  . Myocardial infarction ([place])   . PFO (patent foramen ovale)   . Hypercholesteremia     under control  . History of cancer chemotherapy   . Stroke ([place])   . Broken jaw ([place])     right lower   . Numbness     right lower   . Peripheral vascular disease ([place])   . 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 Prostatectomy  2002  . Peg tube placement  06/13/11 at [facility]    10/2011 removed  . Multiple tooth extractions      prior to radiation  . 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 Elbow surgery  42 years ago    removal left radial head due to hockey accident  . Cholecystectomy  04/2009    3 surgeries involved: lap chole x2 laparotomy  . Exploratory laparotomy  04/2010    Dr. Redmond Pulling (3rd surgery for gall bladder)  . Tonsillectomy      x2: as child then 1985 for Left lingual tonsil removed  . Inguinal hernia repair Right 09/23/2014    Procedure: RIGHT INGUINAL HERNIA REPAIR WITH MESH;  Surgeon: Jackolyn Confer, MD;  Location: [facility] ORS;  Service: General;  Laterality: Right;    Family History  Problem Relation Age of Onset  . Stroke Mother   . Coronary artery disease Brother     had bypass   Social History:  reports that he quit  smoking about 49 years ago. His smoking use included Cigarettes. He has a 1.25 pack-year smoking history. He has never used smokeless tobacco. He reports that he does not drink alcohol or use illicit drugs.  Allergies: No Known Allergies  Medications Prior to Admission  Medication Sig Dispense Refill  . aspirin EC 325 MG EC tablet Take 325 mg by mouth every morning.     Marland Kitchen atorvastatin (LIPITOR) 40 MG tablet Take 1 tablet 5 times a week (Patient taking differently: Take 40 mg by mouth See admin instructions. Take 1 tablet 5 times a week (does not take on Saturday and Sunday)) 70 tablet 3  . B Complex-Biotin-FA (VITAMIN B50 COMPLEX PO) Take 50 mg by mouth daily.    . calcium carbonate (OS-CAL) 600 MG TABS Take 1,200 mg by mouth every evening.     . fish oil-omega-3 fatty acids 1000 MG capsule Take 1 g by mouth daily.     . fluorouracil (EFUDEX) 5 % cream Apply 5 Act topically daily as needed (RASH).     Marland Kitchen  Hyaluronic Acid-Vitamin C (HYALURONIC ACID PO) Take 100 mg by mouth daily.    . L-Glutamine 500 MG TABS Take 500 mg by mouth every evening.     . Lecithin 1200 MG CAPS Take 1,200 mg by mouth daily.     . Misc Natural Products (OSTEO BI-FLEX JOINT SHIELD PO) Take 100 mg by mouth every evening. Pt not sure of dosage.    . Multiple Vitamin (MULTIVITAMIN WITH MINERALS) TABS tablet Take 1 tablet by mouth daily.    . niacinamide 500 MG tablet Take 500 mg by mouth every evening.    Marland Kitchen OVER THE COUNTER MEDICATION Take 20 mg by mouth daily. Lutigold    . pyridOXINE (VITAMIN B-6) 50 MG tablet Take 50 mg by mouth daily.    Marland Kitchen tretinoin (RETIN-A) 0.025 % cream Apply 1 application topically daily as needed (apply to face rash). As Directed    . Turmeric 450 MG CAPS Take 450 mg by mouth daily.     Marland Kitchen UBIQUINOL PO Take 100 mg by mouth daily.     Marland Kitchen VITAMIN A PO Take 1 tablet by mouth daily.    . vitamin C (ASCORBIC ACID) 500 MG tablet Take 500 mg by mouth daily.    . vitamin E 400 UNIT capsule Take 400 Units by  mouth every morning.     . Zinc 25 MG TABS Take 25 mg by mouth daily.    . cholecalciferol (VITAMIN D) 1000 UNITS tablet Take 1,000 Units by mouth daily.    . Sodium Fluoride 1.1 % PSTE Apply thin ribbon of cream to toothbrush. Brush teeth for 2 minutes at bedtime. Spit out excess. Do not swallow. Either this or fluorishield      No results found for this or any previous visit (from the past 48 hour(s)). No results found.  ROS  Blood pressure 167/94, pulse 61, temperature 97.6 F (36.4 C), temperature source Oral, resp. rate 18, height 5\' 9"  (1.753 m), weight 81.194 kg (179 lb), SpO2 97 %. Physical Exam  Constitutional: He appears well-developed and well-nourished. No distress.  HENT:  Head: Normocephalic and atraumatic.  Eyes: No scleral icterus.  GI: Soft.  Midline scar  Genitourinary:  Right groin scar.  Left inguinal bulge that is reducible.  Mole in left groin in line with planned incision site.  Skin: Skin is warm.  Psychiatric: He has a normal mood and affect. His behavior is normal.     Assessment/Plan Left inguinal hernia  Plan:  Left inguinal hernia repair with mesh.  Will need to remove left inguinal skin mole given it is in line with the incision and he is agreeable with this.  ROSENBOWER,TODD J 09/30/2015, 7:21 AM

## 2015-09-30 NOTE — Transfer of Care (Signed)
Immediate Anesthesia Transfer of Care Note  Patient: Jason Noble  Procedure(s) Performed: Procedure(s): HERNIA REPAIR LEFT INGUINAL ADULT (Left) INSERTION OF MESH (Left)  Patient Location: PACU  Anesthesia Type:General  Level of Consciousness: awake, alert , oriented and patient cooperative  Airway & Oxygen Therapy: Patient connected to T-piece oxygen  Post-op Assessment: Report given to RN and Post -op Vital signs reviewed and stable  Post vital signs: Reviewed and stable  Last Vitals:  Filed Vitals:   09/30/15 0538  BP: 167/94  Pulse: 61  Temp: 36.4 C  Resp: 18    Complications: No apparent anesthesia complications

## 2015-09-30 NOTE — Anesthesia Procedure Notes (Signed)
Procedure Name: LMA Insertion Date/Time: 09/30/2015 7:36 AM Performed by: Dione Booze Pre-anesthesia Checklist: Emergency Drugs available, Suction available, Patient being monitored and Patient identified Patient Re-evaluated:Patient Re-evaluated prior to inductionOxygen Delivery Method: Circle system utilized Preoxygenation: Pre-oxygenation with 100% oxygen Intubation Type: IV induction LMA: LMA inserted LMA Size: 5.0 Number of attempts: 1 Placement Confirmation: positive ETCO2 Tube secured with: Tape Dental Injury: Teeth and Oropharynx as per pre-operative assessment

## 2015-09-30 NOTE — Anesthesia Preprocedure Evaluation (Signed)
Anesthesia Evaluation  Patient identified by MRN, date of birth, ID band Patient awake    Reviewed: Allergy & Precautions, NPO status , Patient's Chart, lab work & pertinent test results  History of Anesthesia Complications Negative for: history of anesthetic complications  Airway Mallampati: II  TM Distance: >3 FB Neck ROM: Full  Mouth opening: Limited Mouth Opening  Dental no notable dental hx. (+) Dental Advisory Given   Pulmonary former smoker,    Pulmonary exam normal breath sounds clear to auscultation       Cardiovascular + CAD, + Past MI, + CABG and + Peripheral Vascular Disease  Normal cardiovascular exam Rhythm:Regular Rate:Normal  Please report. The echo is satisfactory. The aortic root is 4.4 and the ascending aorta is 4.5 which is similar to previous study. The left ventricular ejection fraction is satisfactory at 50-55%.CSD  Clearance for surgery given by cardiology, see chart  Hx of TIA   Neuro/Psych CVA, No Residual Symptoms negative psych ROS   GI/Hepatic negative GI ROS, Neg liver ROS,   Endo/Other  negative endocrine ROS  Renal/GU negative Renal ROS  negative genitourinary   Musculoskeletal negative musculoskeletal ROS (+)   Abdominal   Peds negative pediatric ROS (+)  Hematology negative hematology ROS (+)   Anesthesia Other Findings   Reproductive/Obstetrics negative OB ROS                             Anesthesia Physical  Anesthesia Plan  ASA: III  Anesthesia Plan: General   Post-op Pain Management:    Induction: Intravenous  Airway Management Planned: LMA  Additional Equipment: None  Intra-op Plan:   Post-operative Plan: Extubation in OR  Informed Consent: I have reviewed the patients History and Physical, chart, labs and discussed the procedure including the risks, benefits and alternatives for the proposed anesthesia with the patient or  authorized representative who has indicated his/her understanding and acceptance.   Dental advisory given  Plan Discussed with: CRNA  Anesthesia Plan Comments:         Anesthesia Quick Evaluation

## 2015-09-30 NOTE — Anesthesia Postprocedure Evaluation (Signed)
Anesthesia Post Note  Patient: Skyy N Purrington  Procedure(s) Performed: Procedure(s) (LRB): HERNIA REPAIR LEFT INGUINAL ADULT (Left) INSERTION OF MESH (Left)  Patient location during evaluation: PACU Anesthesia Type: General Level of consciousness: awake and alert Pain management: pain level controlled Vital Signs Assessment: post-procedure vital signs reviewed and stable Respiratory status: spontaneous breathing, nonlabored ventilation, respiratory function stable and patient connected to nasal cannula oxygen Cardiovascular status: blood pressure returned to baseline and stable Postop Assessment: no signs of nausea or vomiting Anesthetic complications: no    Last Vitals:  Filed Vitals:   09/30/15 0912 09/30/15 0915  BP: 144/92 154/93  Pulse: 84 82  Temp: 36.8 C   Resp:  14    Last Pain: There were no vitals filed for this visit.               Zenaida Deed

## 2015-09-30 NOTE — Interval H&P Note (Signed)
History and Physical Interval Note:  09/30/2015 7:24 AM  Jason Noble  has presented today for surgery, with the diagnosis of left inguinal hernia  The various methods of treatment have been discussed with the patient and family. After consideration of risks, benefits and other options for treatment, the patient has consented to  Procedure(s): HERNIA REPAIR LEFT INGUINAL ADULT (Left) INSERTION OF MESH (Left) as a surgical intervention .  The patient's history has been reviewed, patient examined, no change in status, stable for surgery.  I have reviewed the patient's chart and labs.  Questions were answered to the patient's satisfaction.     ROSENBOWER,TODD Lenna Sciara

## 2015-09-30 NOTE — Op Note (Signed)
OPERATIVE NOTE-INGUINAL HERNIA  Preoperative diagnosis:  Left inguinal hernia, left groin skin mole  Postoperative diagnosis:  Same  Procedure:  Left inguinal hernia repair with mesh, removal of left groin skin mole  Surgeon:  Jackolyn Confer, M.D.  Anesthesia:  General with local ([place]).  Indication:  This is a 75 year old male with a moderate sized left inguinal hernia requiring manual reduction at times. He also wears a truss. He now presents for repair. In the line of the incision as a skin mole and we discussed removing that as well.  Technique:  He was seen in the holding room and the left groin was marked with my initials. The patient was brought to the operating, placed supine on the operating table, and the anesthetic was administered by the anesthesiologist. The hair in the groin area was clipped as was felt to be necessary. This area was then sterilely prepped and draped.  Local anesthetic was infiltrated in the superficial and deep tissues in the left groin.  An incision was made through the skin and subcutaneous tissue, to include the skin mole (which was sent to pathology), until the external oblique aponeurosis was identified.  Local anesthetic was infiltrated deep to the external oblique aponeurosis. The external oblique aponeurosis was divided through the external ring medially and back toward the anterior superior iliac spine laterally. Using blunt dissection, the shelving edge of the inguinal ligament was identified inferiorly and the internal oblique aponeurosis and muscle were identified superiorly. The ilioinguinal nerve was identified and preserved.  The spermatic cord was isolated and a posterior window was made around it. An indirect hernia sac was identified and separated from the spermatic cord using blunt dissection. The hernia sac and its contents were reduced through the indirect hernia defect. He had a lot of adipose tissue around the spermatic cord but separation  of this would sacrificed some of the blood vessels.   A piece of 3" x 6" polypropylene mesh was brought into the field and anchored 1-2 cm medial to the pubic tubercle with 2-0 Prolene suture. The inferior aspect of the mesh was anchored to the shelving edge of the inguinal ligament with running 2-0 Prolene suture to a level 1-2 cm lateral to the internal ring. A slit was cut in the mesh creating 2 tails. These were wrapped around the spermatic cord. The superior aspect of the mesh was anchored to the internal oblique aponeurosis and muscle with interrupted 2-0 Vicryl sutures. The 2 tails of the mesh were then crossed creating a new internal ring and were anchored to the shelving edge of the inguinal ligament with 2-0 Prolene suture. The tip of a hemostat could be placed through the new aperture. The lateral aspect of the mesh was then tucked deep to the external oblique aponeurosis.  The wound was inspected and hemostasis was adequate. The external oblique aponeurosis was then closed over the mesh and cord with running 3-0 Vicryl suture. The subcutaneous tissue was closed with running 2-0 and 3-0 Vicryl suture. The skin closed with a running 4-0 Monocryl subcuticular stitch.  Steri-Strips and a sterile dressing were applied.  The procedure was well-tolerated without any apparent complications and the he was taken to the recovery room in satisfactory condition.

## 2015-10-06 ENCOUNTER — Other Ambulatory Visit (HOSPITAL_COMMUNITY): Payer: Self-pay | Admitting: Dentistry

## 2015-10-06 MED ORDER — SODIUM FLUORIDE 1.1 % DT PSTE: PASTE | DENTAL | Status: DC

## 2015-10-10 ENCOUNTER — Other Ambulatory Visit: Payer: Self-pay | Admitting: Cardiology

## 2015-10-10 MED ORDER — ATORVASTATIN CALCIUM 40 MG PO TABS: ORAL_TABLET | ORAL | Status: DC

## 2015-10-13 DIAGNOSIS — L905 Scar conditions and fibrosis of skin: Secondary | ICD-10-CM | POA: Diagnosis not present

## 2015-10-13 DIAGNOSIS — C44612 Basal cell carcinoma of skin of right upper limb, including shoulder: Secondary | ICD-10-CM | POA: Diagnosis not present

## 2015-10-13 DIAGNOSIS — C44729 Squamous cell carcinoma of skin of left lower limb, including hip: Secondary | ICD-10-CM | POA: Diagnosis not present

## 2015-10-13 DIAGNOSIS — C44519 Basal cell carcinoma of skin of other part of trunk: Secondary | ICD-10-CM | POA: Diagnosis not present

## 2015-10-21 ENCOUNTER — Ambulatory Visit (INDEPENDENT_AMBULATORY_CARE_PROVIDER_SITE_OTHER): Payer: Medicare Other | Admitting: Cardiology

## 2015-10-21 ENCOUNTER — Encounter: Payer: Self-pay | Admitting: Cardiology

## 2015-10-21 VITALS — BP 140/90 | HR 68 | Ht 69.0 in | Wt 178.0 lb

## 2015-10-21 DIAGNOSIS — I252 Old myocardial infarction: Secondary | ICD-10-CM | POA: Diagnosis not present

## 2015-10-21 DIAGNOSIS — I712 Thoracic aortic aneurysm, without rupture: Secondary | ICD-10-CM | POA: Diagnosis not present

## 2015-10-21 DIAGNOSIS — I7121 Aneurysm of the ascending aorta, without rupture: Secondary | ICD-10-CM

## 2015-10-21 DIAGNOSIS — I259 Chronic ischemic heart disease, unspecified: Secondary | ICD-10-CM | POA: Diagnosis not present

## 2015-10-21 NOTE — Progress Notes (Signed)
Cardiology Office Note   Date:  10/21/2015   ID:  IRAM LUNDBERG, DOB 1940-11-08, MRN [id number]  PCP:  Shirline Frees, MD  Cardiologist: Darlin Coco MD  No chief complaint on file.     History of Present Illness: Jason Noble is a 75 y.o. male who presents for a one-year follow-up office visit Jason Noble is a 75 y.o. male who presents for a one-year follow-up office visit  He is a former patient of Dr. Lia Foyer. 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 The patient has a complex past medical history. He has known coronary disease and had three-vessel CABG in 1991 by Dr. Servando Snare. In 2001 the patient had prostate cancer treated with retropubic radical prostatectomy. 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 In 2009 the patient had a mild stroke from a tiny PFO. Surgical closure was not recommended. In August 2010 the patient had a laparoscopic cholecystectomy by Dr. Greer Pickerel. There were postoperative complications and the patient underwent a repeat laparoscopic cholecystectomy in November 2010. He continued to have problems and finally in August 2011 underwent an open laparoscopy for residual problems in the gallbladder fossa. Since then he has done well regarding his gastrointestinal tract. 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 Dr. Lisbeth Renshaw is his radiation oncologist and Dr. Lamonte Sakai was his chemotherapy oncologist. He did not require surgery for his cancer of the tongue. The patient has a known dilated ascending aorta which last measured 4.4 x 4.1 cm in maximum dimensions new the aortic root by CT scan done in 2009. The patient has been reluctant to have any additional radiation to his chest area because of the radiation he has  already encountered with his cancer. Therefore he is reluctant to have a CT angiogram of the chest at this time. He had an echocardiogram on 12/4812 which showed an ejection fraction of 50-55% and his aortic root was 4.4 cm and has a sitting aorta was 4.5 cm               Since last visit he has been feeling well.  Recent echocardiogram on 01/13/15 showed an ejection fraction of 55-60% and his ascending aorta measurement is unchanged at 4.5 cm. and has not been experiencing any chest pain. 3  weeks ago Dr. Zella Richer performed successful left inguinal herniorrhaphy. The patient is having pain in his left hip and has an appointment to see Dr. Wynelle Link later this month. The patient has not had any recurrent chest discomfort.  He is not having any dizziness or syncope.  No symptoms of CHF   Past Medical History  Diagnosis Date  . Cholecystitis   . Abscess of gallbladder   . CVA (cerebral infarction) 2007    "mild"  . 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 Skin cancer     basal cell, squamous cell  . Prostate cancer ([place]) 2002  . Basal cell carcinoma of skin   . Squamous cell carcinoma of skin   . Coronary heart disease     has a stent  . History of radiation therapy 06/18/2011- 08/03/2011    squamous cell tongue/69.96 Gy 65f  . Fatigue 07/26/2011  . Myocardial infarction ([facility]   . PFO (patent foramen ovale)   .  Hypercholesteremia     under control  . History of cancer chemotherapy   . Stroke ([place])   . Broken jaw ([place])     right lower   . Numbness     right lower   . Peripheral vascular disease ([place])   . 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 Prostatectomy  2002  . Peg tube placement  06/13/11 at [facility]    10/2011 removed  . Multiple tooth extractions      prior to radiation  . 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 Elbow surgery  42 years ago    removal left radial head due to hockey  accident  . Cholecystectomy  04/2009    3 surgeries involved: lap chole x2 laparotomy  . Exploratory laparotomy  04/2010    Dr. Redmond Pulling (3rd surgery for gall bladder)  . Tonsillectomy      x2: as child then 1985 for Left lingual tonsil removed  . Inguinal hernia repair Right 09/23/2014    Procedure: RIGHT INGUINAL HERNIA REPAIR WITH MESH;  Surgeon: Jackolyn Confer, MD;  Location: [facility] ORS;  Service: General;  Laterality: Right;  . Inguinal hernia repair Left 09/30/2015    Procedure: HERNIA REPAIR LEFT INGUINAL ADULT;  Surgeon: Jackolyn Confer, MD;  Location: [facility];  Service: General;  Laterality: Left;  . Insertion of mesh Left 09/30/2015    Procedure: INSERTION OF MESH;  Surgeon: Jackolyn Confer, MD;  Location: [facility];  Service: General;  Laterality: Left;     Current Outpatient Prescriptions  Medication Sig Dispense Refill  . atorvastatin (LIPITOR) 40 MG tablet Take 1 tablet 5 times a week 70 tablet 2  . B Complex-Biotin-FA (VITAMIN B50 COMPLEX PO) Take 50 mg by mouth daily.    . calcium carbonate (OS-CAL) 600 MG TABS Take 1,200 mg by mouth every evening.     . cholecalciferol (VITAMIN D) 1000 UNITS tablet Take 1,000 Units by mouth daily.    . fish oil-omega-3 fatty acids 1000 MG capsule Take 1 g by mouth daily.     . fluorouracil (EFUDEX) 5 % cream Apply 5 Act topically daily as needed (RASH).     Marland Kitchen Hyaluronic Acid-Vitamin C (HYALURONIC ACID PO) Take 100 mg by mouth daily.    . L-Glutamine 500 MG TABS Take 500 mg by mouth every evening.     . Lecithin 1200 MG CAPS Take 1,200 mg by mouth daily.     . Misc Natural Products (OSTEO BI-FLEX JOINT SHIELD PO) Take 100 mg by mouth every evening. Pt not sure of dosage.    . Multiple Vitamin (MULTIVITAMIN WITH MINERALS) TABS tablet Take 1 tablet by mouth daily.    . niacinamide 500 MG tablet Take 500 mg by mouth every evening.    Marland Kitchen OVER THE COUNTER MEDICATION Take 20 mg by mouth daily. Lutigold    . pyridOXINE (VITAMIN B-6) 50 MG tablet Take 50 mg by  mouth daily.    . Sodium Fluoride 1.1 % PSTE Apply thin ribbon of cream to toothbrush. Brush teeth for 2 minutes at bedtime. Spit out excess. Do not swallow. 1 Bottle prn  . tretinoin (RETIN-A) 0.025 % cream Apply 1 application topically daily as needed (apply to face rash). As Directed    . Turmeric 450 MG CAPS Take 450 mg by mouth daily.     Marland Kitchen UBIQUINOL PO Take  100 mg by mouth daily.     Marland Kitchen VITAMIN A PO Take 1 tablet by mouth daily.    . vitamin C (ASCORBIC ACID) 500 MG tablet Take 500 mg by mouth daily.    . vitamin E 400 UNIT capsule Take 400 Units by mouth every morning.     . Zinc 25 MG TABS Take 25 mg by mouth daily.     No current facility-administered medications for this visit.   Facility-Administered Medications Ordered in Other Visits  Medication Dose Route Frequency Provider Last Rate Last Dose  . topical emolient (BIAFINE) emulsion   Topical BID Kyung Rudd, MD        Allergies:   Review of patient's allergies indicates no known allergies.    Social History:  The patient  reports that he quit smoking about 49 years ago. His smoking use included Cigarettes. He has a 1.25 pack-year smoking history. He has never used smokeless tobacco. He reports that he does not drink alcohol or use illicit drugs.   Family History:  The patient's family history includes Coronary artery disease in his brother; Stroke in his mother.    ROS:  Please see the history of present illness.   Otherwise, review of systems are positive for none.   All other systems are reviewed and negative.    PHYSICAL EXAM: VS:  BP 140/90 mmHg  Pulse 68  Ht '5\' 9"'  (1.753 m)  Wt 178 lb (80.74 kg)  BMI 26.27 kg/m2 , BMI Body mass index is 26.27 kg/(m^2). GEN: Well nourished, well developed, in no acute distress HEENT: normal Neck: no JVD, carotid bruits, or masses Cardiac: RRR; no murmurs, rubs, or gallops,no edema  Respiratory:  clear to auscultation bilaterally, normal work of breathing GI: soft, nontender,  nondistended, + BS MS: no deformity or atrophy Skin: warm and dry, no rash Neuro:  Strength and sensation are intact Psych: euthymic mood, full affect   EKG:  EKG is not ordered today. The  EKG on 08/29/15 shows normal sinus rhythm and is within normal limits.   Recent Labs: 09/21/2015: ALT 19; BUN 27*; Creatinine, Ser 1.13; Hemoglobin 15.6; Platelets 173; Potassium 5.5*; Sodium 140    Lipid Panel    Component Value Date/Time   CHOL 164 07/05/2015 0923   TRIG 52 07/05/2015 0923   HDL 57 07/05/2015 0923   CHOLHDL 2.9 07/05/2015 0923   VLDL 10 07/05/2015 0923   LDLCALC 97 07/05/2015 0923   LDLDIRECT 168.1 12/15/2012 1539      Wt Readings from Last 3 Encounters:  10/21/15 178 lb (80.74 kg)  09/30/15 179 lb (81.194 kg)  09/21/15 179 lb (81.194 kg)         ASSESSMENT AND PLAN:  1. Ischemic heart disease status post CABG in 1991 and status post stenting of saphenous vein graft in 2008 with a bare-metal stent, doing well with no recurrent angina. 2. ascending thoracic aortic aneurysm, stable echocardiogram April 2016 3. history of prostate cancer treated with retropubic radical prostatectomy. 4. past history of cancer of the tongue treated with radiotherapy and chemotherapy. 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 5. past history of cholelithiasis with history of retroperitoneal abscess following surgery. 6. hypercholesterolemia, back on statins for the past year 7. known small PFO with past history of small stroke. On aspirin. No recurrence. 8. concerned about getting too much irradiation. We are now following his aneurysm with echocardiogram 9.  Recent successful left inguinal herniorrhaphy 10.  Osteoarthritis of left hip   Current medicines are reviewed at length with the patient today.  The patient does not have concerns regarding medicines.  The following changes have been  made:  no change  Labs/ tests ordered today include:  No orders of the defined types were placed in this encounter.    Disposition: The patient is to continue current medication.  He is cleared for possible upcoming left hip surgery.Marland Kitchen He will return in one year for follow-up office visit and EKG with Dr. Stanford Breed.   Berna Spare MD 10/21/2015 11:54 AM    [address], [place], [place]  [address] Phone: [phone]; Fax: [phone]

## 2015-10-21 NOTE — Patient Instructions (Signed)
Medication Instructions:  Your physician recommends that you continue on your current medications as directed. Please refer to the Current Medication list given to you today.  Labwork: none  Testing/Procedures: none  Follow-Up: Your physician wants you to follow-up in: 1 year ov/ekg  with Dr Trellis Moment will receive a reminder letter in the mail two months in advance. If you don't receive a letter, please call our office to schedule the follow-up appointment.  Any Other Special Instructions Will Be Listed Below (If Applicable).     If you need a refill on your cardiac medications before your next appointment, please call your pharmacy.

## 2015-11-10 DIAGNOSIS — M1612 Unilateral primary osteoarthritis, left hip: Secondary | ICD-10-CM | POA: Diagnosis not present

## 2015-11-11 ENCOUNTER — Ambulatory Visit: Payer: Self-pay | Admitting: Cardiology

## 2015-11-17 ENCOUNTER — Inpatient Hospital Stay: Admission: RE | Admit: 2015-11-17 | Payer: Self-pay | Source: Ambulatory Visit | Admitting: Radiation Oncology

## 2015-11-21 DIAGNOSIS — L570 Actinic keratosis: Secondary | ICD-10-CM | POA: Diagnosis not present

## 2015-11-22 DIAGNOSIS — M25552 Pain in left hip: Secondary | ICD-10-CM | POA: Diagnosis not present

## 2015-11-22 DIAGNOSIS — M1612 Unilateral primary osteoarthritis, left hip: Secondary | ICD-10-CM | POA: Diagnosis not present

## 2015-11-28 ENCOUNTER — Telehealth: Payer: Self-pay | Admitting: *Deleted

## 2015-11-28 NOTE — Telephone Encounter (Signed)
Called Dr. Constance Holster office, ENT [phone] left voice message  asking for most recent TSH results and office visit last TSH was 04/27/14 Asked to fax back to [phone] or cll me for any questions 10:54 AM

## 2015-11-30 ENCOUNTER — Ambulatory Visit
Admission: RE | Admit: 2015-11-30 | Discharge: 2015-11-30 | Disposition: A | Discharge status: Home / Self Care | Payer: Medicare Other | Source: Ambulatory Visit | Attending: Radiation Oncology | Admitting: Radiation Oncology

## 2015-11-30 ENCOUNTER — Encounter: Payer: Self-pay | Admitting: Radiation Oncology

## 2015-11-30 VITALS — BP 154/82 | HR 66 | Temp 97.8°F | Resp 20 | Ht 69.0 in | Wt 176.8 lb

## 2015-11-30 DIAGNOSIS — C01 Malignant neoplasm of base of tongue: Secondary | ICD-10-CM | POA: Diagnosis not present

## 2015-11-30 MED ORDER — PILOCARPINE HCL 5 MG PO TABS: 5.0000 mg | ORAL_TABLET | Freq: Three times a day (TID) | ORAL | Status: DC

## 2015-11-30 NOTE — Progress Notes (Signed)
Radiation Oncology         [phone] ________________________________  Name: Jason Noble MRN: [id number]  Date: 11/30/2015  DOB: [birth year]  Follow-Up Visit Note  CC: Jason Frees, MD  Izora Gala, MD  Diagnosis:   Squamous cell carcinoma of the base of tongue  Interval Since Last Radiation: 4 years and 4 months  06/18/2011 to 08/03/2011: Jason Noble was treated on our tomotherapy unit using an IMRT technique with daily image guidance. He was treated to a dose of 69.96 Gy in 33 fractions at 2.12 Gy per fraction. The patient did receive treatment corresponding to a simultaneous integrated boost technique in which there was a PTV 6270 and PTV 5610, which corresponded to intermediate and lower risk target volumes. All of these were treated in 33 fractions as well as well with varying radiation doses on a daily basis.  Narrative:  The patient returns today for routine follow-up. He has no complaints of pain or nausea. His last TSH was on 04/27/14 and was 3.131. He eats slowly now and is able to eat steak. Reports a dry mouth. Taste is good and he has no difficulty swallowing food or fluids. He reports a return of appetite. He denies fatigue. He wants to defer any scoping until his last visit of the 5 year mark in November. He will be getting a left hip replacement soon.  ALLERGIES:  has No Known Allergies.  Meds: Current Outpatient Prescriptions  Medication Sig Dispense Refill  . atorvastatin (LIPITOR) 40 MG tablet Take 1 tablet 5 times a week 70 tablet 2  . B Complex-Biotin-FA (VITAMIN B50 COMPLEX PO) Take 50 mg by mouth daily.    . calcium carbonate (OS-CAL) 600 MG TABS Take 1,200 mg by mouth every evening.     . cholecalciferol (VITAMIN D) 1000 UNITS tablet Take 1,000 Units by mouth daily.    . fish oil-omega-3 fatty acids 1000 MG capsule Take 1 g by mouth daily.     . fluorouracil (EFUDEX) 5 % cream Apply 5 Act topically daily as needed (RASH).     Marland Kitchen Hyaluronic  Acid-Vitamin C (HYALURONIC ACID PO) Take 100 mg by mouth daily.    . L-Glutamine 500 MG TABS Take 500 mg by mouth every evening.     . Lecithin 1200 MG CAPS Take 1,200 mg by mouth daily.     . Misc Natural Products (OSTEO BI-FLEX JOINT SHIELD PO) Take 100 mg by mouth every evening. Pt not sure of dosage.    . Multiple Vitamin (MULTIVITAMIN WITH MINERALS) TABS tablet Take 1 tablet by mouth daily.    . niacinamide 500 MG tablet Take 500 mg by mouth every evening.    Marland Kitchen OVER THE COUNTER MEDICATION Take 20 mg by mouth daily. Lutigold    . pyridOXINE (VITAMIN B-6) 50 MG tablet Take 50 mg by mouth daily.    . Sodium Fluoride 1.1 % PSTE Apply thin ribbon of cream to toothbrush. Brush teeth for 2 minutes at bedtime. Spit out excess. Do not swallow. 1 Bottle prn  . tretinoin (RETIN-A) 0.025 % cream Apply 1 application topically daily as needed (apply to face rash). As Directed    . Turmeric 450 MG CAPS Take 450 mg by mouth daily.     Marland Kitchen UBIQUINOL PO Take 100 mg by mouth daily.     Marland Kitchen VITAMIN A PO Take 1 tablet by mouth daily.    . vitamin C (ASCORBIC ACID) 500 MG tablet Take 500 mg by mouth daily.    Marland Kitchen  Zinc 25 MG TABS Take 25 mg by mouth daily.    . vitamin E 400 UNIT capsule Take 400 Units by mouth every morning. Reported on 11/30/2015     No current facility-administered medications for this encounter.   Facility-Administered Medications Ordered in Other Encounters  Medication Dose Route Frequency Provider Last Rate Last Dose  . topical emolient (BIAFINE) emulsion   Topical BID Kyung Rudd, MD        Physical Findings: The patient is in no acute distress. Patient is alert and oriented.  height is 5\' 9"  (1.753 m) and weight is 176 lb 12.8 oz (80.196 kg). His oral temperature is 97.8 F (36.6 C). His blood pressure is 154/82 and his pulse is 66. His respiration is 20 and oxygen saturation is 96%. .   Oral cavity clear with no suspicious findings. Some moistness present. No cervical lymphadenopathy  present. His skin looks well post radiation treatment. Laryngoscope exam: Deferred today at patient's request.  Lab Findings: Lab Results  Component Value Date   WBC 5.4 09/21/2015   HGB 15.6 09/21/2015   HCT 47.0 09/21/2015   MCV 93.1 09/21/2015   PLT 173 09/21/2015   Lab Results  Component Value Date   TSH 3.131 04/27/2014    Radiographic Findings: No results found.  Impression:    The patient is doing well at this time clinically. He remains clinically NED. Laryngoscopy deferred today at his request.  Plan:  Patient will return for his 5th year follow-up in November. I will prescribe Salagen for the patient's dry mouth. His pharmacy is the [facility] on [address].  Jodelle Gross, M.D., Ph.D.  The patient was seen today for 15 minutes, with the majority of the time spent counseling the patient on his diagnosis of cancer and coordinating his care.   This document serves as a record of services personally performed by Kyung Rudd, MD. It was created on his behalf by Darcus Austin, a trained medical scribe. The creation of this record is based on the scribe's personal observations and the provider's statements to them. This document has been checked and approved by the attending provider.

## 2015-11-30 NOTE — Progress Notes (Signed)
Follow up s/p rad txs base of tongue. No c/o pain or nausea, , last TSH done on 04/27/14=3.131, last Office visit Dr. Constance Holster, , will have left hip  Either Memorial Day or July ,  Taste is good, no difficulty swallowing food or fluids, mouth drys unless gum  Under tongue, he wants to defer any scoping until last visit of the 5 year mark  In Novemember stated no fatigue BP 154/82 mmHg  Pulse 66  Temp(Src) 97.8 F (36.6 C) (Oral)  Resp 20  Ht 5\' 9"  (1.753 m)  Wt 176 lb 12.8 oz (80.196 kg)  BMI 26.10 kg/m2  SpO2 96%  Wt Readings from Last 3 Encounters:  11/30/15 176 lb 12.8 oz (80.196 kg)  10/21/15 178 lb (80.74 kg)  09/30/15 179 lb (81.194 kg)

## 2015-12-09 DIAGNOSIS — G5622 Lesion of ulnar nerve, left upper limb: Secondary | ICD-10-CM | POA: Diagnosis not present

## 2015-12-09 DIAGNOSIS — M67912 Unspecified disorder of synovium and tendon, left shoulder: Secondary | ICD-10-CM | POA: Diagnosis not present

## 2015-12-14 DIAGNOSIS — G5622 Lesion of ulnar nerve, left upper limb: Secondary | ICD-10-CM | POA: Diagnosis not present

## 2015-12-26 DIAGNOSIS — M25552 Pain in left hip: Secondary | ICD-10-CM | POA: Diagnosis not present

## 2015-12-27 ENCOUNTER — Telehealth: Payer: Self-pay | Admitting: Cardiology

## 2015-12-27 NOTE — Telephone Encounter (Signed)
New message      Former pt of Dr Mare Ferrari.  Dr Mare Ferrari recommended Dr Maureen Ralphs for his hip surgery.  Dr Maureen Ralphs is booked until July.  Someone recommened Dr Mayer Camel .  He is calling to see if this is a person Dr Mare Ferrari would also recommend.  He want to ask Jason Noble if possible.

## 2015-12-27 NOTE — Telephone Encounter (Signed)
Patient saw Dr Mayer Camel who is able to get his hip done end of May  No request received for surgical clearance but will forward to Dr Stanford Breed for review

## 2015-12-27 NOTE — Telephone Encounter (Signed)
Will need preop eval by pa Kirk Ruths

## 2015-12-28 NOTE — Telephone Encounter (Signed)
Patient saw Dr. Mayer Camel yesterday and his surgery is scheduled for May 24th, 2017.  Patient will be gone on a cruise from May 12th-21st.  Patient aware he will need an appt with either Dr. Stanford Breed or a PA for clearance.  Aware a scheduler will contact him to arrange this.   Staff message sent to [facility] scheduling pool to arrange an appointment

## 2015-12-28 NOTE — Telephone Encounter (Signed)
Patient scheduled for appt 01/25/2016

## 2015-12-30 DIAGNOSIS — R293 Abnormal posture: Secondary | ICD-10-CM | POA: Diagnosis not present

## 2015-12-30 DIAGNOSIS — M50322 Other cervical disc degeneration at C5-C6 level: Secondary | ICD-10-CM | POA: Diagnosis not present

## 2015-12-30 DIAGNOSIS — M9901 Segmental and somatic dysfunction of cervical region: Secondary | ICD-10-CM | POA: Diagnosis not present

## 2015-12-30 DIAGNOSIS — M6240 Contracture of muscle, unspecified site: Secondary | ICD-10-CM | POA: Diagnosis not present

## 2016-01-04 ENCOUNTER — Other Ambulatory Visit: Payer: Self-pay | Admitting: Orthopedic Surgery

## 2016-01-11 DIAGNOSIS — G5622 Lesion of ulnar nerve, left upper limb: Secondary | ICD-10-CM | POA: Diagnosis not present

## 2016-01-25 ENCOUNTER — Encounter: Payer: Self-pay | Admitting: Cardiology

## 2016-01-25 ENCOUNTER — Ambulatory Visit (INDEPENDENT_AMBULATORY_CARE_PROVIDER_SITE_OTHER): Payer: Medicare Other | Admitting: Cardiology

## 2016-01-25 VITALS — BP 142/92 | HR 68 | Ht 69.0 in | Wt 174.0 lb

## 2016-01-25 DIAGNOSIS — Z01810 Encounter for preprocedural cardiovascular examination: Secondary | ICD-10-CM

## 2016-01-25 DIAGNOSIS — E785 Hyperlipidemia, unspecified: Secondary | ICD-10-CM | POA: Diagnosis not present

## 2016-01-25 DIAGNOSIS — I259 Chronic ischemic heart disease, unspecified: Secondary | ICD-10-CM

## 2016-01-25 DIAGNOSIS — I712 Thoracic aortic aneurysm, without rupture, unspecified: Secondary | ICD-10-CM

## 2016-01-25 DIAGNOSIS — Z951 Presence of aortocoronary bypass graft: Secondary | ICD-10-CM | POA: Diagnosis not present

## 2016-01-25 DIAGNOSIS — I77810 Thoracic aortic ectasia: Secondary | ICD-10-CM

## 2016-01-25 DIAGNOSIS — I10 Essential (primary) hypertension: Secondary | ICD-10-CM | POA: Insufficient documentation

## 2016-01-25 NOTE — Assessment & Plan Note (Signed)
B/P elevated today, pt says its usually normal

## 2016-01-25 NOTE — Assessment & Plan Note (Signed)
Pt here for pre op clearance for hip surgery

## 2016-01-25 NOTE — Assessment & Plan Note (Signed)
4.5 cm April 2016

## 2016-01-25 NOTE — Patient Instructions (Signed)
Cleared for upcoming surgery   Your physician recommends that you schedule a follow-up appointment in: 2 months with Dr.Crenshaw

## 2016-01-25 NOTE — Assessment & Plan Note (Signed)
LDL 97 in Oct 2016

## 2016-01-25 NOTE — Progress Notes (Signed)
01/25/2016 Jason Noble   May 29, 1941  [id number]  Primary Physician Jason Frees, MD Primary Cardiologist: Dr Jason Noble (formerly Dr Jason Noble)  HPI:  75 y/o male with a history of CAD, s/p CABG in 1991 and SVG BMS in 2008. He has not had a stress test since then but he has not had problems with angina since his PCI. He is in the office today for pre op clearance prior to proposed Rt hip surgery by Dr Jason Noble. He has done well since he saw Dr Jason Noble in Feb 2017. Dr Jason Noble actually cleared him for surgery at that time but the pt had put this off. He is active, uses a chain saw, does strenuous yard work without chest pain or unusual dyspnea.    Current Outpatient Prescriptions  Medication Sig Dispense Refill  . aspirin 325 MG tablet Take 325 mg by mouth every morning.    Marland Kitchen atorvastatin (LIPITOR) 40 MG tablet Take 1 tablet 5 times a week 70 tablet 2  . B Complex-Biotin-FA (VITAMIN B50 COMPLEX PO) Take 50 mg by mouth daily.    . calcium carbonate (OS-CAL) 600 MG TABS Take 1,200 mg by mouth every evening.     . cholecalciferol (VITAMIN D) 1000 UNITS tablet Take 1,000 Units by mouth daily.    . fish oil-omega-3 fatty acids 1000 MG capsule Take 1 g by mouth daily.     Marland Kitchen Hyaluronic Acid-Vitamin C (HYALURONIC ACID PO) Take 100 mg by mouth daily.    . L-Glutamine 500 MG TABS Take 500 mg by mouth every evening.     . Lecithin 1200 MG CAPS Take 1,200 mg by mouth daily.     . Misc Natural Products (OSTEO BI-FLEX JOINT SHIELD PO) Take 100 mg by mouth every evening. Pt not sure of dosage.    . Multiple Vitamin (MULTIVITAMIN WITH MINERALS) TABS tablet Take 1 tablet by mouth daily.    . niacinamide 500 MG tablet Take 500 mg by mouth every evening.    Marland Kitchen OVER THE COUNTER MEDICATION Take 20 mg by mouth daily. Lutigold    . pyridOXINE (VITAMIN B-6) 50 MG tablet Take 50 mg by mouth daily.    . Sodium Fluoride 1.1 % PSTE Apply thin ribbon of cream to toothbrush. Brush teeth for 2 minutes at  bedtime. Spit out excess. Do not swallow. 1 Bottle prn  . tretinoin (RETIN-A) 0.025 % cream Apply 1 application topically daily as needed (apply to face rash). As Directed    . Turmeric 450 MG CAPS Take 450 mg by mouth daily.     Marland Kitchen UBIQUINOL PO Take 100 mg by mouth daily.     Marland Kitchen VITAMIN A PO Take 1 tablet by mouth daily.    . vitamin C (ASCORBIC ACID) 500 MG tablet Take 500 mg by mouth 2 (two) times daily.     . vitamin E 400 UNIT capsule Take 400 Units by mouth every morning. Reported on 11/30/2015    . Zinc 25 MG TABS Take 25 mg by mouth daily.     No current facility-administered medications for this visit.   Facility-Administered Medications Ordered in Other Visits  Medication Dose Route Frequency Provider Last Rate Last Dose  . topical emolient (BIAFINE) emulsion   Topical BID Jason Rudd, MD        No Known Allergies  Social History   Social History  . Marital Status: Married    Spouse Name: N/A  . Number of Children: N/A  . Years of  Education: N/A   Occupational History  . Not on file.   Social History Main Topics  . Smoking status: Former Smoker -- 0.25 packs/day for 5 years    Types: Cigarettes    Quit date: 03/20/1966  . Smokeless tobacco: Never Used  . Alcohol Use: No  . Drug Use: No  . Sexual Activity: Not on file   Other Topics Concern  . Not on file   Social History Narrative     Review of Systems: General: negative for chills, fever, night sweats or weight changes.  Cardiovascular: negative for chest pain, dyspnea on exertion, edema, orthopnea, palpitations, paroxysmal nocturnal dyspnea or shortness of breath Dermatological: negative for rash Respiratory: negative for cough or wheezing Urologic: negative for hematuria Abdominal: negative for nausea, vomiting, diarrhea, bright red blood per rectum, melena, or hematemesis Neurologic: negative for visual changes, syncope, or dizziness All other systems reviewed and are otherwise negative except as noted  above.    Blood pressure 142/92, pulse 68, height 5\' 9"  (1.753 m), weight 174 lb (78.926 kg).  General appearance: alert, cooperative and no distress Neck: no carotid bruit and no JVD Lungs: clear to auscultation bilaterally Heart: regular rate and rhythm Abdomen: soft, non-tender; bowel sounds normal; no masses,  no organomegaly and midline surgical scar Extremities: extremities normal, atraumatic, no cyanosis or edema Pulses: 2+ and symmetric Skin: Skin color, texture, turgor normal. 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 His B/P is a little high today and we discussed this. I suggested starting an anti hypertensive but he was reluctant at this time.  I asked him to track his B/P reading 3 times a week and follow up with Dr Jason Noble about this. We may also want to increase his statin Rx but I get the impression he may be somewhat resistant to medication changes.  F/U Dr Jason Noble in 2 months.   KILROY,LUKE K PA-C 01/25/2016 9:35 AM

## 2016-01-25 NOTE — Pre-Procedure Instructions (Signed)
Douglas N Litzau  01/25/2016      [facility] [place], [place] [address] [place] [address] Phone: [phone] Fax: [facility] [address], [place] [address] [place] [address] Phone: [phone] Fax: [phone]    Your procedure is scheduled on May 24  Report to [facility] at 1045 A.M.  Call this number if you have problems the morning of surgery:  [phone]   Remember:  Do not eat food or drink liquids after midnight.   Take these medicines the morning of surgery with A SIP OF WATER na   Stop taking aspirin, Ibuprofen, Advil, Motrin, Aleve, BC's, Goody's, Herbal medications, fish oil -7 days before your surgery   Do not wear jewelry, make-up or nail polish.  Do not wear lotions, powders, or perfumes.  You may wear deodorant.  Do not shave 48 hours prior to surgery.  Men may shave face and neck.  Do not bring valuables to the hospital.  [facility] is not responsible for any belongings or valuables.  Contacts, dentures or bridgework may not be worn into surgery.  Leave your suitcase in the car.  After surgery it may be brought to your room.  For patients admitted to the hospital, discharge time will be determined by your treatment team.  Patients discharged the day of surgery will not be allowed to drive home.    Special instructions: [facility] - Preparing for Surgery  Before surgery, you can play an important role.  Because skin is not sterile, your skin needs to be as free of germs as possible.  You can reduce the number of germs on you skin by washing with CHG (chlorahexidine gluconate) soap before surgery.  CHG is an antiseptic cleaner which kills germs and bonds with the skin to continue killing germs even after washing.  Please DO NOT use if you have an allergy to CHG or antibacterial soaps.  If your skin becomes reddened/irritated stop using  the CHG and inform your nurse when you arrive at Short Stay.  Do not shave (including legs and underarms) for at least 48 hours prior to the first CHG shower.  You may shave your face.  Please follow these instructions carefully:   1.  Shower with CHG Soap the night before surgery and the                                morning of Surgery.  2.  If you choose to wash your hair, wash your hair first as usual with your       normal shampoo.  3.  After you shampoo, rinse your hair and body thoroughly to remove the                      Shampoo.  4.  Use CHG as you would any other liquid soap.  You can apply chg directly       to the skin and wash gently with scrungie or a clean washcloth.  5.  Apply the CHG Soap to your body ONLY FROM THE NECK DOWN.        Do not use on open wounds or open sores.  Avoid contact with your eyes,       ears, mouth and genitals (private parts).  Wash genitals (private  parts)       with your normal soap.  6.  Wash thoroughly, paying special attention to the area where your surgery        will be performed.  7.  Thoroughly rinse your body with warm water from the neck down.  8.  DO NOT shower/wash with your normal soap after using and rinsing off       the CHG Soap.  9.  Pat yourself dry with a clean towel.            10.  Wear clean pajamas.            11.  Place clean sheets on your bed the night of your first shower and do not        sleep with pets.  Day of Surgery  Do not apply any lotions/deoderants the morning of surgery.  Please wear clean clothes to the hospital/surgery center.     Please read over the following fact sheets that you were given. Pain Booklet, Coughing and Deep Breathing, Blood Transfusion Information, MRSA Information and Surgical Site Infection Prevention

## 2016-01-25 NOTE — Assessment & Plan Note (Signed)
4.4 cm 2009- no f/u CT done secondary to pt's concern for excessive radiation

## 2016-01-26 ENCOUNTER — Ambulatory Visit (HOSPITAL_COMMUNITY)
Admission: RE | Admit: 2016-01-26 | Discharge: 2016-01-26 | Disposition: A | Discharge status: Home / Self Care | Payer: Medicare Other | Source: Ambulatory Visit | Attending: Orthopedic Surgery | Admitting: Orthopedic Surgery

## 2016-01-26 ENCOUNTER — Encounter (HOSPITAL_COMMUNITY)
Admission: RE | Admit: 2016-01-26 | Discharge: 2016-01-26 | Disposition: A | Discharge status: Home / Self Care | Payer: Medicare Other | Source: Ambulatory Visit | Attending: Orthopedic Surgery | Admitting: Orthopedic Surgery

## 2016-01-26 ENCOUNTER — Encounter (HOSPITAL_COMMUNITY): Payer: Self-pay

## 2016-01-26 DIAGNOSIS — I517 Cardiomegaly: Secondary | ICD-10-CM | POA: Diagnosis not present

## 2016-01-26 DIAGNOSIS — Z01812 Encounter for preprocedural laboratory examination: Secondary | ICD-10-CM | POA: Insufficient documentation

## 2016-01-26 DIAGNOSIS — Z01818 Encounter for other preprocedural examination: Secondary | ICD-10-CM | POA: Diagnosis not present

## 2016-01-26 DIAGNOSIS — M1612 Unilateral primary osteoarthritis, left hip: Secondary | ICD-10-CM | POA: Insufficient documentation

## 2016-01-26 DIAGNOSIS — Z0183 Encounter for blood typing: Secondary | ICD-10-CM | POA: Diagnosis not present

## 2016-01-26 DIAGNOSIS — I77810 Thoracic aortic ectasia: Secondary | ICD-10-CM | POA: Diagnosis not present

## 2016-01-26 DIAGNOSIS — R918 Other nonspecific abnormal finding of lung field: Secondary | ICD-10-CM | POA: Diagnosis not present

## 2016-01-26 HISTORY — DX: Paresthesia of skin: R20.2

## 2016-01-26 HISTORY — DX: Anesthesia of skin: R20.0

## 2016-01-26 LAB — CBC WITH DIFFERENTIAL/PLATELET
Basophils Absolute: 0 10*3/uL (ref 0.0–0.1)
Basophils Relative: 1 %
Eosinophils Absolute: 0.5 10*3/uL (ref 0.0–0.7)
Eosinophils Relative: 8 %
HCT: 44.9 % (ref 39.0–52.0)
Hemoglobin: 15.3 g/dL (ref 13.0–17.0)
Lymphocytes Relative: 25 %
Lymphs Abs: 1.5 10*3/uL (ref 0.7–4.0)
MCH: 30.9 pg (ref 26.0–34.0)
MCHC: 34.1 g/dL (ref 30.0–36.0)
MCV: 90.7 fL (ref 78.0–100.0)
Monocytes Absolute: 0.5 10*3/uL (ref 0.1–1.0)
Monocytes Relative: 9 %
Neutro Abs: 3.6 10*3/uL (ref 1.7–7.7)
Neutrophils Relative %: 59 %
Platelets: 203 10*3/uL (ref 150–400)
RBC: 4.95 MIL/uL (ref 4.22–5.81)
RDW: 13.6 % (ref 11.5–15.5)
WBC: 6.1 10*3/uL (ref 4.0–10.5)

## 2016-01-26 LAB — PROTIME-INR
INR: 1.06 (ref 0.00–1.49)
Prothrombin Time: 14 seconds (ref 11.6–15.2)

## 2016-01-26 LAB — URINALYSIS, ROUTINE W REFLEX MICROSCOPIC
Bilirubin Urine: NEGATIVE
Glucose, UA: NEGATIVE mg/dL
Hgb urine dipstick: NEGATIVE
Ketones, ur: NEGATIVE mg/dL
Leukocytes, UA: NEGATIVE
Nitrite: NEGATIVE
Protein, ur: NEGATIVE mg/dL
Specific Gravity, Urine: 1.02 (ref 1.005–1.030)
pH: 6 (ref 5.0–8.0)

## 2016-01-26 LAB — SURGICAL PCR SCREEN
MRSA, PCR: POSITIVE — AB
Staphylococcus aureus: POSITIVE — AB

## 2016-01-26 LAB — APTT: aPTT: 29 seconds (ref 24–37)

## 2016-01-26 LAB — BASIC METABOLIC PANEL
Anion gap: 10 (ref 5–15)
BUN: 16 mg/dL (ref 6–20)
CO2: 27 mmol/L (ref 22–32)
Calcium: 9.8 mg/dL (ref 8.9–10.3)
Chloride: 105 mmol/L (ref 101–111)
Creatinine, Ser: 1.1 mg/dL (ref 0.61–1.24)
GFR calc Af Amer: >60 mL/min (ref >60)
GFR calc non Af Amer: >60 mL/min (ref >60)
Glucose, Bld: 102 mg/dL — ABNORMAL HIGH (ref 65–99)
Potassium: 4.5 mmol/L (ref 3.5–5.1)
Sodium: 142 mmol/L (ref 135–145)

## 2016-01-26 LAB — TYPE AND SCREEN
ABO/RH(D): O POS
Antibody Screen: NEGATIVE

## 2016-01-26 NOTE — Progress Notes (Addendum)
PCP is Dr. Shirline Frees Cardiologist is Dr. Mare Ferrari, but will see Dr Stanford Breed, since Dr, Mare Ferrari has retired. Clearance noted from [facility]. PA(01-25-16) Echo noted from 01-13-2016 Denies a recent stress test, or cxr EKG noted in epic from 08-29-15 states he had an EKG yesterday, that I am not able to view at this time.

## 2016-01-26 NOTE — Progress Notes (Signed)
Called pt and informed him of pcr results, states he will be on a ship and will not be able to get the medication. So he will need Betadine the day of surgery.

## 2016-01-27 NOTE — Progress Notes (Signed)
Anesthesia Chart Review: Patient is a 75 year old male scheduled for left THA, anterior on 02/08/16 by Dr. Mayer Camel.  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   PCP is Dr. Blythe Stanford. Cardiologist will be Dr. Stanford Breed (was Dr. Mare Ferrari until his recent retirement; last visit with Dr. Mare Ferrari on 10/21/15 and was actually cleared for hip surgery at that time). Patient was seen by Kerin Ransom, PA-C on 01/25/16 for preoperative cardiac clearance. ENT is Dr. Constance Holster. RAD-ONC is Dr. Lisbeth Renshaw. HEM-ONC is Dr. Alvy Bimler.  Patient will be on a cruise from 01/27/16-02/05/16.  01/25/16 EKG: NSR, voltage criteria for LVH.  01/13/15 Echo: Study Conclusions - Left ventricle: The cavity size was normal. Wall thickness was  increased in a pattern of mild LVH. There was severe focal basal  hypertrophy of the septum. Systolic function was normal. The  estimated ejection fraction was in the range of 55% to 60%. Wall  motion was normal; there were no regional wall motion  abnormalities. Doppler parameters are consistent with abnormal  left ventricular relaxation (grade 1 diastolic dysfunction). The  E/e&' ratio is <8, suggesting normal LV filling pressure. - Aortic valve: Trileaflet. Sclerosis without stenosis. There was  no regurgitation. - Aorta: Aortic root dimension: 45 mm (ED). - Ascending aorta: The ascending aorta was  dilated. - Mitral valve: Calcified annulus. Mildly thickened leaflets .  There was trivial regurgitation. - Left atrium: The atrium was at the upper limits of normal in  size. - Right atrium: The atrium was mildly dilated. - Atrial septum: Aneurysmal - PFO cannot be excluded. - Tricuspid valve: There was mild regurgitation. - Pulmonary arteries: PA peak pressure: 44 mm Hg (S). - Inferior vena cava: The vessel was normal in size. The  respirophasic diameter changes were in the normal range (= 50%),  consistent with normal central venous pressure. Impressions: - Compared to the prior study in 2014, EF is improved to 55-60%,  there is now severe focal basal septal hypertrophy. Aortic valve  sclerosis. The ascending aorta measures up to 4.5 cm at the most  distally visualized point. There is mild TR with mild pulmonary  hypertension, RVSP 44 mmHg. (Dr. Mare Ferrari reviewed and felt, "The echocardiogram is satisfactory. The aortic dimension is still unchanged at 4.5. The left ventricular function remains good with ejection fraction 55-60%. Continue current medication."  TEE 04/19/08: SUMMARY - Moderate PFO present by 2 D echo as well as when tested with    injection of agitated saline. Large amount of contrast shunts    R to L. PFO is located at inferior aspect of interatrial    septum There was no left atrial appendage thrombus    identified. There was redundancy of the interatrial septum,    with borderline criteria for atrial septal aneurysm.  05/12/07 ETT: Study was felt to be negative for inducible ischemia at a very good work load.  Last cardiac cath was in 2008 at [facility].  01/26/16 CXR: IMPRESSION: 1. Cannot exclude a subtle lung nodule  at the left lung base posteriorly within the left lower lobe. Recommend CT of the chest to assess further as noted above. 2. Stable mild cardiomegaly and ectatic descending thoracic aorta.  Preoperative  labs noted.   I left a voice message for Juliann Pulse at Dr. Damita Dunnings office regarding CXR report (and patient's reluctance to more radiation exposure) and also confirming that Dr. Mayer Camel was aware of PFO history since he will be at increased CVA risk and wanted to make sure that had been discussed with patient. I've asked Juliann Pulse to give me an update next week after she speaks with Dr. Mayer Camel. Patient is on a cruise until 02/05/16, so likely this issues will have to be addressed either the day before or day of surgery.   George Hugh [facility] Short Stay Center/Anesthesiology Phone [phone] 01/27/2016 6:12 PM

## 2016-02-07 MED ORDER — DEXTROSE-NACL 5-0.45 % IV SOLN: INTRAVENOUS | Status: DC

## 2016-02-07 MED ORDER — CEFAZOLIN SODIUM-DEXTROSE 2-4 GM/100ML-% IV SOLN
2.0000 g | INTRAVENOUS | Status: AC
  Administered 2016-02-08: 2 g via INTRAVENOUS
  Filled 2016-02-07: qty 100

## 2016-02-07 NOTE — H&P (Signed)
TOTAL HIP ADMISSION H&P  Patient is admitted for left total hip arthroplasty.  Subjective:  Chief Complaint: left hip pain  HPI: Jason Noble, 75 y.o. male, has a history of pain and functional disability in the left hip(s) due to arthritis and patient has failed non-surgical conservative treatments for greater than 12 weeks to include NSAID's and/or analgesics, corticosteriod injections, flexibility and strengthening excercises, use of assistive devices and activity modification.  Onset of symptoms was gradual starting 1 years ago with gradually worsening course since that time.The patient noted no past surgery on the left hip(s).  Patient currently rates pain in the left hip at 10 out of 10 with activity. Patient has night pain, worsening of pain with activity and weight bearing, pain that interfers with activities of daily living and pain with passive range of motion. Patient has evidence of joint space narrowing by imaging studies. This condition presents safety issues increasing the risk of falls. There is no current active infection.  Patient Active Problem List   Diagnosis Date Noted  . Pre-operative cardiovascular examination 01/25/2016  . Dilated aortic root ([place]) 01/25/2016  . Essential hypertension 01/25/2016  . Hearing deficit 04/27/2014  . Dry mouth 04/27/2014  . Malignant neoplasm of base of tongue ([place]) 07/27/2011  . Fatigue 07/26/2011  . Aneurysm of thoracic aorta ([place]) 09/05/2010  . PATENT FORAMEN OVALE 11/28/2009  . MYOCARDIAL INFARCTION, HX OF 11/24/2009  . Hx of CABG 11/24/2009  . PROSTATE CANCER, HX OF 11/24/2009  . CEREBROVASCULAR ACCIDENT, HX OF 11/24/2009  . Dyslipidemia 11/23/2009  . MALAISE AND FATIGUE 11/23/2009  . CHOLECYSTECTOMY, HX OF 11/23/2009   Past Medical History  Diagnosis Date  . Cholecystitis   . Abscess of gallbladder   . CVA (cerebral infarction) 2007    "mild"  . Coronary heart disease     has a stent  . History of radiation therapy  06/18/2011- 08/03/2011    squamous cell tongue/69.96 Gy 1fx  . Fatigue 07/26/2011  . Myocardial infarction ([place])   . PFO (patent foramen ovale)   . Hypercholesteremia     under control  . History of cancer chemotherapy   . Stroke ([place])   . Broken jaw ([place])     right lower   . Numbness     right lower   . Peripheral vascular disease ([place])   . Aneurysm of thoracic aorta (HCC)     4.5 cm   . Numbness and tingling in left hand     numbess 2 fingers   . 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 Skin cancer     basal cell, squamous cell  . Prostate cancer ([place]) 2002  . Basal cell carcinoma of skin   . Squamous cell carcinoma of skin     Past Surgical History  Procedure Laterality Date  . Prostatectomy  2002  . Peg tube placement  06/13/11 at [facility]    10/2011 removed  . Multiple tooth extractions      prior to radiation  . 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 Elbow surgery  42 years ago    removal left radial head due to hockey accident  . Cholecystectomy  04/2009    3 surgeries involved: lap chole x2 laparotomy  . Exploratory laparotomy  04/2010    Dr. Redmond Pulling (3rd surgery for gall bladder)  . Tonsillectomy      x2: as child then 1985 for Left lingual tonsil removed  .  Inguinal hernia repair Right 09/23/2014    Procedure: RIGHT INGUINAL HERNIA REPAIR WITH MESH;  Surgeon: Jackolyn Confer, MD;  Location: [facility];  Service: General;  Laterality: Right;  . Inguinal hernia repair Left 09/30/2015    Procedure: HERNIA REPAIR LEFT INGUINAL ADULT;  Surgeon: Jackolyn Confer, MD;  Location: [facility];  Service: General;  Laterality: Left;  . Insertion of mesh Left 09/30/2015    Procedure: INSERTION OF MESH;  Surgeon: Jackolyn Confer, MD;  Location: [facility];  Service: General;  Laterality: Left;    No prescriptions prior to admission   No Known Allergies  Social History  Substance Use Topics  . Smoking status: Former Smoker -- 0.25 packs/day for 5 years    Types: Cigarettes     Quit date: 03/20/1966  . Smokeless tobacco: Never Used  . Alcohol Use: No    Family History  Problem Relation Age of Onset  . Stroke Mother   . Coronary artery disease Brother     had bypass     Review of Systems  Constitutional: Negative.   HENT: Positive for hearing loss.   Eyes: Negative.   Respiratory: Negative.   Cardiovascular: Negative.   Gastrointestinal: Negative.   Genitourinary:       Prostate cancer  Musculoskeletal: Positive for joint pain.  Skin: Negative.   Neurological: Negative.   Endo/Heme/Allergies: Negative.   Psychiatric/Behavioral: Negative.     Objective:  Physical Exam  Constitutional: He is oriented to person, place, and time. He appears well-developed and well-nourished.  HENT:  Head: Normocephalic and atraumatic.  Eyes: Pupils are equal, round, and reactive to light.  Neck: Normal range of motion. Neck supple.  Cardiovascular: Intact distal pulses.   Respiratory: Effort normal.  Musculoskeletal: He exhibits tenderness.  Patient walks with a left-sided limp.  Any attempts at internal rotation left hip causes significant pain.  Internal rotation is to 10 external rotation is to 40 foot tap is negative.  Good power to testing of the hip flexors extensors, abductors abductors internal and external rotators toes are pink and well-perfused.  Neurological: He is alert and oriented to person, place, and time.  Skin: Skin is warm and dry.  Psychiatric: He has a normal mood and affect. His behavior is normal. Judgment and thought content normal.    Vital signs in last 24 hours:    Labs:   Estimated body mass index is 26.10 kg/(m^2) as calculated from the following:   Height as of 11/30/15: 5\' 9"  (1.753 m).   Weight as of 11/30/15: 80.196 kg (176 lb 12.8 oz).   Imaging Review Plain radiographs demonstrate bone-on-bone arthritic changes.   Assessment/Plan:  End stage arthritis, left hip(s)  The patient history, physical examination,  clinical judgement of the provider and imaging studies are consistent with end stage degenerative joint disease of the left hip(s) and total hip arthroplasty is deemed medically necessary. The treatment options including medical management, injection therapy, arthroscopy and arthroplasty were discussed at length. The risks and benefits of total hip arthroplasty were presented and reviewed. The risks due to aseptic loosening, infection, stiffness, dislocation/subluxation,  thromboembolic complications and other imponderables were discussed.  The patient acknowledged the explanation, agreed to proceed with the plan and consent was signed. Patient is being admitted for inpatient treatment for surgery, pain control, PT, OT, prophylactic antibiotics, VTE prophylaxis, progressive ambulation and ADL's and discharge planning.The patient is planning to be discharged home with home health services

## 2016-02-08 ENCOUNTER — Inpatient Hospital Stay (HOSPITAL_COMMUNITY): Payer: Medicare Other | Admitting: Anesthesiology

## 2016-02-08 ENCOUNTER — Inpatient Hospital Stay (HOSPITAL_COMMUNITY): Payer: Medicare Other

## 2016-02-08 ENCOUNTER — Inpatient Hospital Stay (HOSPITAL_COMMUNITY): Payer: Medicare Other | Admitting: Vascular Surgery

## 2016-02-08 ENCOUNTER — Encounter (HOSPITAL_COMMUNITY)
Admission: RE | Disposition: A | Discharge status: Home / Self Care | Payer: Self-pay | Source: Ambulatory Visit | Attending: Orthopedic Surgery

## 2016-02-08 ENCOUNTER — Inpatient Hospital Stay (HOSPITAL_COMMUNITY)
Admission: RE | Admit: 2016-02-08 | Discharge: 2016-02-10 | DRG: 470 | Disposition: A | Discharge status: Home / Self Care | Payer: Medicare Other | Source: Ambulatory Visit | Attending: Orthopedic Surgery | Admitting: Orthopedic Surgery

## 2016-02-08 ENCOUNTER — Encounter (HOSPITAL_COMMUNITY): Payer: Self-pay | Admitting: Anesthesiology

## 2016-02-08 DIAGNOSIS — Z471 Aftercare following joint replacement surgery: Secondary | ICD-10-CM | POA: Diagnosis not present

## 2016-02-08 DIAGNOSIS — Z85810 Personal history of malignant neoplasm of tongue: Secondary | ICD-10-CM | POA: Diagnosis not present

## 2016-02-08 DIAGNOSIS — I739 Peripheral vascular disease, unspecified: Secondary | ICD-10-CM | POA: Diagnosis present

## 2016-02-08 DIAGNOSIS — Q211 Atrial septal defect: Secondary | ICD-10-CM

## 2016-02-08 DIAGNOSIS — Z955 Presence of coronary angioplasty implant and graft: Secondary | ICD-10-CM

## 2016-02-08 DIAGNOSIS — I2510 Atherosclerotic heart disease of native coronary artery without angina pectoris: Secondary | ICD-10-CM | POA: Diagnosis present

## 2016-02-08 DIAGNOSIS — Z87891 Personal history of nicotine dependence: Secondary | ICD-10-CM

## 2016-02-08 DIAGNOSIS — Z85828 Personal history of other malignant neoplasm of skin: Secondary | ICD-10-CM

## 2016-02-08 DIAGNOSIS — Z8249 Family history of ischemic heart disease and other diseases of the circulatory system: Secondary | ICD-10-CM

## 2016-02-08 DIAGNOSIS — D62 Acute posthemorrhagic anemia: Secondary | ICD-10-CM | POA: Diagnosis not present

## 2016-02-08 DIAGNOSIS — Z923 Personal history of irradiation: Secondary | ICD-10-CM

## 2016-02-08 DIAGNOSIS — E785 Hyperlipidemia, unspecified: Secondary | ICD-10-CM | POA: Diagnosis present

## 2016-02-08 DIAGNOSIS — Z823 Family history of stroke: Secondary | ICD-10-CM | POA: Diagnosis not present

## 2016-02-08 DIAGNOSIS — M169 Osteoarthritis of hip, unspecified: Secondary | ICD-10-CM | POA: Diagnosis not present

## 2016-02-08 DIAGNOSIS — Z8546 Personal history of malignant neoplasm of prostate: Secondary | ICD-10-CM | POA: Diagnosis not present

## 2016-02-08 DIAGNOSIS — M1612 Unilateral primary osteoarthritis, left hip: Principal | ICD-10-CM | POA: Diagnosis present

## 2016-02-08 DIAGNOSIS — R911 Solitary pulmonary nodule: Secondary | ICD-10-CM | POA: Diagnosis not present

## 2016-02-08 DIAGNOSIS — I10 Essential (primary) hypertension: Secondary | ICD-10-CM | POA: Diagnosis present

## 2016-02-08 DIAGNOSIS — Z96642 Presence of left artificial hip joint: Secondary | ICD-10-CM | POA: Diagnosis not present

## 2016-02-08 DIAGNOSIS — I252 Old myocardial infarction: Secondary | ICD-10-CM | POA: Diagnosis not present

## 2016-02-08 DIAGNOSIS — Z419 Encounter for procedure for purposes other than remedying health state, unspecified: Secondary | ICD-10-CM

## 2016-02-08 DIAGNOSIS — Z951 Presence of aortocoronary bypass graft: Secondary | ICD-10-CM | POA: Diagnosis not present

## 2016-02-08 DIAGNOSIS — Z8673 Personal history of transient ischemic attack (TIA), and cerebral infarction without residual deficits: Secondary | ICD-10-CM | POA: Diagnosis not present

## 2016-02-08 HISTORY — PX: TOTAL HIP ARTHROPLASTY: SHX124

## 2016-02-08 SURGERY — ARTHROPLASTY, HIP, TOTAL, ANTERIOR APPROACH
Anesthesia: Spinal | Site: Hip | Laterality: Left

## 2016-02-08 MED ORDER — KCL IN DEXTROSE-NACL 20-5-0.45 MEQ/L-%-% IV SOLN
INTRAVENOUS | Status: AC
  Filled 2016-02-08: qty 1000

## 2016-02-08 MED ORDER — METHOCARBAMOL 1000 MG/10ML IJ SOLN
500.0000 mg | Freq: Four times a day (QID) | INTRAVENOUS | Status: DC | PRN
  Filled 2016-02-08: qty 5

## 2016-02-08 MED ORDER — ALUMINUM HYDROXIDE GEL 320 MG/5ML PO SUSP
15.0000 mL | ORAL | Status: DC | PRN
  Filled 2016-02-08: qty 30

## 2016-02-08 MED ORDER — PROPOFOL 1000 MG/100ML IV EMUL
INTRAVENOUS | Status: AC
  Filled 2016-02-08: qty 100

## 2016-02-08 MED ORDER — KCL IN DEXTROSE-NACL 20-5-0.45 MEQ/L-%-% IV SOLN
INTRAVENOUS | Status: DC
  Administered 2016-02-08 – 2016-02-09 (×2): via INTRAVENOUS
  Filled 2016-02-08: qty 1000

## 2016-02-08 MED ORDER — DIPHENHYDRAMINE HCL 12.5 MG/5ML PO ELIX: 12.5000 mg | ORAL_SOLUTION | ORAL | Status: DC | PRN

## 2016-02-08 MED ORDER — METOCLOPRAMIDE HCL 5 MG/ML IJ SOLN: 5.0000 mg | Freq: Three times a day (TID) | INTRAMUSCULAR | Status: DC | PRN

## 2016-02-08 MED ORDER — ASPIRIN EC 325 MG PO TBEC: 325.0000 mg | DELAYED_RELEASE_TABLET | Freq: Two times a day (BID) | ORAL | Status: DC

## 2016-02-08 MED ORDER — HYDROMORPHONE HCL 1 MG/ML IJ SOLN
0.2500 mg | INTRAMUSCULAR | Status: DC | PRN
  Administered 2016-02-08: 0.25 mg via INTRAVENOUS

## 2016-02-08 MED ORDER — DEXAMETHASONE SODIUM PHOSPHATE 10 MG/ML IJ SOLN
10.0000 mg | Freq: Once | INTRAMUSCULAR | Status: AC
Start: 2016-02-09 — End: 2016-02-09
  Administered 2016-02-09: 10 mg via INTRAVENOUS
  Filled 2016-02-08: qty 1

## 2016-02-08 MED ORDER — BUPIVACAINE IN DEXTROSE 0.75-8.25 % IT SOLN
INTRATHECAL | Status: DC | PRN
  Administered 2016-02-08: 2 mL via INTRATHECAL

## 2016-02-08 MED ORDER — TRANEXAMIC ACID 1000 MG/10ML IV SOLN
2000.0000 mg | INTRAVENOUS | Status: DC
  Filled 2016-02-08: qty 20

## 2016-02-08 MED ORDER — CETYLPYRIDINIUM CHLORIDE 0.05 % MT LIQD
7.0000 mL | Freq: Two times a day (BID) | OROMUCOSAL | Status: DC
  Administered 2016-02-09: 7 mL via OROMUCOSAL

## 2016-02-08 MED ORDER — ACETAMINOPHEN 325 MG PO TABS
650.0000 mg | ORAL_TABLET | Freq: Four times a day (QID) | ORAL | Status: DC | PRN
  Administered 2016-02-10: 650 mg via ORAL
  Filled 2016-02-08: qty 2

## 2016-02-08 MED ORDER — METHOCARBAMOL 500 MG PO TABS
500.0000 mg | ORAL_TABLET | Freq: Four times a day (QID) | ORAL | Status: DC | PRN
  Administered 2016-02-08: 500 mg via ORAL
  Filled 2016-02-08 (×2): qty 1

## 2016-02-08 MED ORDER — PROPOFOL 10 MG/ML IV BOLUS
INTRAVENOUS | Status: AC
  Filled 2016-02-08: qty 20

## 2016-02-08 MED ORDER — ONDANSETRON HCL 4 MG/2ML IJ SOLN: 4.0000 mg | Freq: Four times a day (QID) | INTRAMUSCULAR | Status: DC | PRN

## 2016-02-08 MED ORDER — CHLORHEXIDINE GLUCONATE 4 % EX LIQD: 60.0000 mL | Freq: Once | CUTANEOUS | Status: DC

## 2016-02-08 MED ORDER — HYDROMORPHONE HCL 1 MG/ML IJ SOLN
1.0000 mg | INTRAMUSCULAR | Status: DC | PRN
  Administered 2016-02-08: 1 mg via INTRAVENOUS
  Filled 2016-02-08: qty 1

## 2016-02-08 MED ORDER — ACETAMINOPHEN 650 MG RE SUPP: 650.0000 mg | Freq: Four times a day (QID) | RECTAL | Status: DC | PRN

## 2016-02-08 MED ORDER — BISACODYL 5 MG PO TBEC: 5.0000 mg | DELAYED_RELEASE_TABLET | Freq: Every day | ORAL | Status: DC | PRN

## 2016-02-08 MED ORDER — ONDANSETRON HCL 4 MG PO TABS: 4.0000 mg | ORAL_TABLET | Freq: Four times a day (QID) | ORAL | Status: DC | PRN

## 2016-02-08 MED ORDER — METOCLOPRAMIDE HCL 5 MG PO TABS: 5.0000 mg | ORAL_TABLET | Freq: Three times a day (TID) | ORAL | Status: DC | PRN

## 2016-02-08 MED ORDER — PHENYLEPHRINE HCL 10 MG/ML IJ SOLN
INTRAMUSCULAR | Status: DC | PRN
  Administered 2016-02-08 (×5): 80 ug via INTRAVENOUS

## 2016-02-08 MED ORDER — MIDAZOLAM HCL 2 MG/2ML IJ SOLN
INTRAMUSCULAR | Status: AC
  Filled 2016-02-08: qty 2

## 2016-02-08 MED ORDER — HYDROMORPHONE HCL 1 MG/ML IJ SOLN
INTRAMUSCULAR | Status: AC
  Administered 2016-02-08: 0.25 mg via INTRAVENOUS
  Filled 2016-02-08: qty 1

## 2016-02-08 MED ORDER — FENTANYL CITRATE (PF) 100 MCG/2ML IJ SOLN
INTRAMUSCULAR | Status: DC | PRN
  Administered 2016-02-08 (×4): 50 ug via INTRAVENOUS

## 2016-02-08 MED ORDER — TIZANIDINE HCL 2 MG PO TABS: 2.0000 mg | ORAL_TABLET | Freq: Four times a day (QID) | ORAL | Status: DC | PRN

## 2016-02-08 MED ORDER — ONDANSETRON HCL 4 MG/2ML IJ SOLN: 4.0000 mg | Freq: Once | INTRAMUSCULAR | Status: DC | PRN

## 2016-02-08 MED ORDER — LACTATED RINGERS IV SOLN
INTRAVENOUS | Status: DC
  Administered 2016-02-08: 50 mL/h via INTRAVENOUS
  Administered 2016-02-08 (×2): via INTRAVENOUS

## 2016-02-08 MED ORDER — PHENOL 1.4 % MT LIQD: 1.0000 | OROMUCOSAL | Status: DC | PRN

## 2016-02-08 MED ORDER — PHENYLEPHRINE HCL 10 MG/ML IJ SOLN
10.0000 mg | INTRAMUSCULAR | Status: DC | PRN
  Administered 2016-02-08: 20 ug/min via INTRAVENOUS

## 2016-02-08 MED ORDER — ONDANSETRON HCL 4 MG/2ML IJ SOLN
INTRAMUSCULAR | Status: AC
  Filled 2016-02-08: qty 2

## 2016-02-08 MED ORDER — CHLORHEXIDINE GLUCONATE 0.12 % MT SOLN
15.0000 mL | Freq: Two times a day (BID) | OROMUCOSAL | Status: DC
  Administered 2016-02-09 – 2016-02-10 (×5): 15 mL via OROMUCOSAL
  Filled 2016-02-08 (×4): qty 15

## 2016-02-08 MED ORDER — FENTANYL CITRATE (PF) 250 MCG/5ML IJ SOLN
INTRAMUSCULAR | Status: AC
  Filled 2016-02-08: qty 5

## 2016-02-08 MED ORDER — DOCUSATE SODIUM 100 MG PO CAPS
100.0000 mg | ORAL_CAPSULE | Freq: Two times a day (BID) | ORAL | Status: DC
  Administered 2016-02-08 – 2016-02-10 (×4): 100 mg via ORAL
  Filled 2016-02-08 (×4): qty 1

## 2016-02-08 MED ORDER — OXYCODONE HCL 5 MG PO TABS
5.0000 mg | ORAL_TABLET | ORAL | Status: DC | PRN
  Administered 2016-02-08 – 2016-02-09 (×5): 10 mg via ORAL
  Filled 2016-02-08 (×6): qty 2

## 2016-02-08 MED ORDER — TAMSULOSIN HCL 0.4 MG PO CAPS
0.4000 mg | ORAL_CAPSULE | Freq: Every day | ORAL | Status: DC
  Administered 2016-02-08 – 2016-02-09 (×2): 0.4 mg via ORAL
  Filled 2016-02-08 (×3): qty 1

## 2016-02-08 MED ORDER — POLYETHYLENE GLYCOL 3350 17 G PO PACK: 17.0000 g | PACK | Freq: Every day | ORAL | Status: DC | PRN

## 2016-02-08 MED ORDER — OXYCODONE-ACETAMINOPHEN 5-325 MG PO TABS: 1.0000 | ORAL_TABLET | ORAL | Status: DC | PRN

## 2016-02-08 MED ORDER — FLEET ENEMA 7-19 GM/118ML RE ENEM: 1.0000 | ENEMA | Freq: Once | RECTAL | Status: DC | PRN

## 2016-02-08 MED ORDER — CHLORHEXIDINE GLUCONATE CLOTH 2 % EX PADS
6.0000 | MEDICATED_PAD | Freq: Every day | CUTANEOUS | Status: DC
  Administered 2016-02-10: 6 via TOPICAL

## 2016-02-08 MED ORDER — MIDAZOLAM HCL 5 MG/5ML IJ SOLN
INTRAMUSCULAR | Status: DC | PRN
  Administered 2016-02-08: 1 mg via INTRAVENOUS

## 2016-02-08 MED ORDER — PROPOFOL 500 MG/50ML IV EMUL
INTRAVENOUS | Status: DC | PRN
  Administered 2016-02-08: 50 ug/kg/min via INTRAVENOUS
  Administered 2016-02-08: 30 ug/kg/min via INTRAVENOUS

## 2016-02-08 MED ORDER — MUPIROCIN 2 % EX OINT
1.0000 "application " | TOPICAL_OINTMENT | Freq: Two times a day (BID) | CUTANEOUS | Status: DC
  Administered 2016-02-08 – 2016-02-10 (×4): 1 via NASAL
  Filled 2016-02-08: qty 22

## 2016-02-08 MED ORDER — ONDANSETRON HCL 4 MG/2ML IJ SOLN
INTRAMUSCULAR | Status: DC | PRN
  Administered 2016-02-08: 4 mg via INTRAVENOUS

## 2016-02-08 MED ORDER — 0.9 % SODIUM CHLORIDE (POUR BTL) OPTIME
TOPICAL | Status: DC | PRN
  Administered 2016-02-08: 1000 mL

## 2016-02-08 MED ORDER — TRANEXAMIC ACID 1000 MG/10ML IV SOLN
2000.0000 mg | INTRAVENOUS | Status: DC | PRN
  Administered 2016-02-08: 2000 mg via TOPICAL

## 2016-02-08 MED ORDER — MENTHOL 3 MG MT LOZG: 1.0000 | LOZENGE | OROMUCOSAL | Status: DC | PRN

## 2016-02-08 MED ORDER — ASPIRIN EC 325 MG PO TBEC
325.0000 mg | DELAYED_RELEASE_TABLET | Freq: Every day | ORAL | Status: DC
  Administered 2016-02-09 – 2016-02-10 (×2): 325 mg via ORAL
  Filled 2016-02-08 (×2): qty 1

## 2016-02-08 SURGICAL SUPPLY — 49 items
CAPT HIP TOTAL 2 ×1 IMPLANT
COVER PERINEAL POST (MISCELLANEOUS) ×2 IMPLANT
COVER SURGICAL LIGHT HANDLE (MISCELLANEOUS) ×2 IMPLANT
DRAPE C-ARM 42X72 X-RAY (DRAPES) ×2 IMPLANT
DRAPE PROXIMA HALF (DRAPES) ×1 IMPLANT
DRAPE STERI IOBAN 125X83 (DRAPES) ×2 IMPLANT
DRAPE U-SHAPE 47X51 STRL (DRAPES) ×4 IMPLANT
DRSG AQUACEL AG ADV 3.5X10 (GAUZE/BANDAGES/DRESSINGS) ×2 IMPLANT
DURAPREP 26ML APPLICATOR (WOUND CARE) ×2 IMPLANT
ELECT BLADE 4.0 EZ CLEAN MEGAD (MISCELLANEOUS) ×2
ELECT REM PT RETURN 9FT ADLT (ELECTROSURGICAL) ×2
ELECTRODE BLDE 4.0 EZ CLN MEGD (MISCELLANEOUS) ×1 IMPLANT
ELECTRODE REM PT RTRN 9FT ADLT (ELECTROSURGICAL) ×1 IMPLANT
FACESHIELD WRAPAROUND (MASK) ×4 IMPLANT
FACESHIELD WRAPAROUND OR TEAM (MASK) ×2 IMPLANT
GLOVE BIO SURGEON STRL SZ7.5 (GLOVE) ×3 IMPLANT
GLOVE BIO SURGEON STRL SZ8.5 (GLOVE) ×4 IMPLANT
GLOVE BIOGEL PI IND STRL 6.5 (GLOVE) IMPLANT
GLOVE BIOGEL PI IND STRL 8 (GLOVE) ×2 IMPLANT
GLOVE BIOGEL PI IND STRL 9 (GLOVE) ×1 IMPLANT
GLOVE BIOGEL PI INDICATOR 6.5 (GLOVE) ×1
GLOVE BIOGEL PI INDICATOR 8 (GLOVE) ×3
GLOVE BIOGEL PI INDICATOR 9 (GLOVE) ×1
GLOVE SURG SS PI 6.5 STRL IVOR (GLOVE) ×2 IMPLANT
GLOVE SURG SS PI 8.0 STRL IVOR (GLOVE) ×1 IMPLANT
GOWN STRL REUS W/ TWL LRG LVL3 (GOWN DISPOSABLE) ×1 IMPLANT
GOWN STRL REUS W/ TWL XL LVL3 (GOWN DISPOSABLE) ×2 IMPLANT
GOWN STRL REUS W/TWL LRG LVL3 (GOWN DISPOSABLE) ×2
GOWN STRL REUS W/TWL XL LVL3 (GOWN DISPOSABLE) ×6
KIT BASIN OR (CUSTOM PROCEDURE TRAY) ×2 IMPLANT
KIT ROOM TURNOVER OR (KITS) ×2 IMPLANT
MANIFOLD NEPTUNE II (INSTRUMENTS) ×2 IMPLANT
NS IRRIG 1000ML POUR BTL (IV SOLUTION) ×2 IMPLANT
PACK TOTAL JOINT (CUSTOM PROCEDURE TRAY) ×2 IMPLANT
PAD ARMBOARD 7.5X6 YLW CONV (MISCELLANEOUS) ×4 IMPLANT
SAW OSC TIP CART 19.5X105X1.3 (SAW) ×2 IMPLANT
SUT ETHIBOND NAB CT1 #1 30IN (SUTURE) ×2 IMPLANT
SUT VIC AB 0 CTX 36 (SUTURE) ×2
SUT VIC AB 0 CTX36XBRD ANTBCTR (SUTURE) ×1 IMPLANT
SUT VIC AB 1 CTX 36 (SUTURE) ×2
SUT VIC AB 1 CTX36XBRD ANBCTR (SUTURE) ×1 IMPLANT
SUT VIC AB 2-0 CTX 27 (SUTURE) ×1 IMPLANT
SUT VIC AB 3-0 PS2 18 (SUTURE)
SUT VIC AB 3-0 PS2 18XBRD (SUTURE) ×1 IMPLANT
TOWEL OR 17X24 6PK STRL BLUE (TOWEL DISPOSABLE) ×2 IMPLANT
TOWEL OR 17X26 10 PK STRL BLUE (TOWEL DISPOSABLE) ×2 IMPLANT
TRAY CATH 16FR W/PLASTIC CATH (SET/KITS/TRAYS/PACK) ×1 IMPLANT
TRAY FOLEY CATH 14FR (SET/KITS/TRAYS/PACK) IMPLANT
YANKAUER SUCT BULB TIP NO VENT (SUCTIONS) ×1 IMPLANT

## 2016-02-08 NOTE — Op Note (Signed)
OPERATIVE REPORT    DATE OF PROCEDURE:  02/08/2016       PREOPERATIVE DIAGNOSIS:  left hip osetoarthritis                                                          POSTOPERATIVE DIAGNOSIS:  left hip osetoarthritis                                                           PROCEDURE: Anterior L total hip arthroplasty using a 54 mm DePuy Pinnacle  Cup, [employer], 0-degree polyethylene liner, a +1.5 mm ceramic head, a 9 Depuy Triloc stem   SURGEON: Frederik Pear J    ASSISTANT:   [employer] K[employer]  (present throughout entire procedure and necessary for timely completion of the procedure)   ANESTHESIA: spinal BLOOD LOSS: 500 FLUID REPLACEMENT: 1500 crystalloid Antibiotic: 2gm ancef Tranexamic Acid: 2gm topical COMPLICATIONS: none    INDICATIONS FOR PROCEDURE: A 75 y.o. year-old With  left hip osetoarthritis   for 3 years, x-rays show bone-on-bone arthritic changes, and osteophytes. Despite conservative measures with observation, anti-inflammatory medicine, narcotics, use of a cane, has severe unremitting pain and can ambulate only a few blocks before resting. Patient desires elective L total hip arthroplasty to decrease pain and increase function. The risks, benefits, and alternatives were discussed at length including but not limited to the risks of infection, bleeding, nerve injury, stiffness, blood clots, the need for revision surgery, cardiopulmonary complications, among others, and they were willing to proceed. Questions answered     PROCEDURE IN DETAIL: The patient was identified by armband,  received preoperative IV antibiotics in the holding area at [facility] taken to the operating room , appropriate anesthetic monitors  were attached and  anesthesia was induced with the patienton the gurney. The HANA boots were applied to the feet and he was then transferred to the HANA table with a peroneal post and support underneath the non-operative le, which was  locked in 5 lb traction. Theoperative lower extremity was then prepped and draped in the usual sterile fashion from just above the iliac crest to the knee. And a timeout procedure was performed. We then made a 10 cm incision along the interval at the leading edge of the tensor fascia lata of starting at 2 cm lateral to and 2 cm distal to the ASIS. Small bleeders in the skin and subcutaneous tissue identified and cauterized we dissected down to the fascia and made an incision in the fascia allowing [place] to elevate the fascia of the tensor muscle and exploited the interval between the rectus and the tensor fascia lata. A Hohmann retractor was then placed along the superior neck of the femur and a Cobra retractor along the inferior neck of the femur we teed the capsule starting out at the superior anterior aspect of the acetabulum going distally and made the T along the neck both leaflets of the T were tagged with #2 Ethibond suture. Cobra retractors were then placed along the inferior and superior neck allowing [place] to perform a standard neck cut and removed the femoral head  with a power corkscrew. We then placed a right angle Hohmann retractor along the anterior aspect of the acetabulum a spiked Cobra in the cotyloid notch and posteriorly a Muelller retractor. We then sequentially reamed up to a 53 mm basket reamer obtaining good coverage in all quadrants, verified by C-arm imaging. Under C-arm control with and hammered into place a 54 mm Pinnacle cup in 45 of abduction and 15 of anteversion. The cup seated nicely and required no supplemental screws. We then placed a central hole Eliminator and a 0 polyethylene liner. The foot was then externally rotated to 100, the HANA elevator was placed around the flare of the greater trochanter and the limb was extended and abducted delivering the proximal femur up into the wound. A medium Hohmann retractor was placed over the greater trochanter and a Mueller retractor along the  posterior femoral neck completing the exposure. We then performed releases superiorly and and inferiorly of the capsule going back to the pirformis fossa superiorly and to the lesser trochanter inferiorly. We then entered the proximal femur with the box cutting offset chisel followed by, a canal sounder, the chili pepper and broaching up to a 9 broach. This seated nicely and we reamed the calcar. A trial reduction was performed with a 1.5 mm 36 mm head.The limb lengths were excellent the hip was stable in 90 of external rotation. At this point the trial components removed and we hammered into place a # 9 Tri-Lock stem with G[employer] coating. This was a std offset stem and a + 1.5 mm ceramic ball was then hammered into place the hip was reduced and final C-arm images obtained. The wound was thoroughly irrigated with normal saline solution. We repaired the ant capsule and the tensor fascia lot a with running 0 vicryl suture. the subcutaneous tissue was closed with 2-0 and 3-0 Vicryl suture followed by an Aquacil dressing. At this point the patient was awaken and transferred to hospital gurney without difficulty. The subcutaneous tissue with 0 and 2-0 undyed Vicryl suture and the skin with running  3-0 vicryl subcuticular suture. Aquacil dressing was applied. The patient was then unclamped, rolled supine, awaken extubated and taken to recovery room without difficulty in stable condition.   Frederik Pear J 02/08/2016, 2:40 PM

## 2016-02-08 NOTE — Anesthesia Preprocedure Evaluation (Addendum)
Anesthesia Evaluation  Patient identified by MRN, date of birth, ID band Patient awake    Reviewed: Allergy & Precautions, NPO status , Patient's Chart, lab work & pertinent test results  History of Anesthesia Complications Negative for: history of anesthetic complications  Airway Mallampati: II  TM Distance: >3 FB Neck ROM: Full  Mouth opening: Limited Mouth Opening  Dental no notable dental hx. (+) Dental Advisory Given   Pulmonary former smoker,    Pulmonary exam normal breath sounds clear to auscultation       Cardiovascular hypertension, Pt. on medications + CAD, + Past MI, + CABG and + Peripheral Vascular Disease  Normal cardiovascular exam Rhythm:Regular Rate:Normal  Echo is satisfactory. The aortic root is 4.4 and the ascending aorta is 4.5 which is similar to previous study. 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   Abdominal   Peds negative pediatric ROS (+)  Hematology negative hematology ROS (+)   Anesthesia Other Findings   Reproductive/Obstetrics negative OB ROS                           Anesthesia Physical  Anesthesia Plan  ASA: III  Anesthesia Plan: Spinal   Post-op Pain Management:    Induction: Intravenous  Airway Management Planned: Natural Airway  Additional Equipment:   Intra-op Plan:   Post-operative Plan:   Informed Consent: I have reviewed the patients History and Physical, chart, labs and discussed the procedure including the risks, benefits and  alternatives for the proposed anesthesia with the patient or authorized representative who has indicated his/her understanding and acceptance.   Dental advisory given  Plan Discussed with: CRNA, Surgeon and Anesthesiologist  Anesthesia Plan Comments:        Anesthesia Quick Evaluation

## 2016-02-08 NOTE — Interval H&P Note (Signed)
History and Physical Interval Note:  02/08/2016 12:33 PM  Jason Noble  has presented today for surgery, with the diagnosis of LEFT HIP OSTEOARTHRITIS  The various methods of treatment have been discussed with the patient and family. After consideration of risks, benefits and other options for treatment, the patient has consented to  Procedure(s): TOTAL HIP ARTHROPLASTY ANTERIOR APPROACH (Left) as a surgical intervention .  The patient's history has been reviewed, patient examined, no change in status, stable for surgery.  I have reviewed the patient's chart and labs.  Questions were answered to the patient's satisfaction.     Kerin Salen

## 2016-02-08 NOTE — Anesthesia Procedure Notes (Signed)
Spinal Patient location during procedure: OR Start time: 02/08/2016 1:00 PM Staffing Anesthesiologist: Josephine Igo Performed by: anesthesiologist  Preanesthetic Checklist Completed: patient identified, site marked, surgical consent, pre-op evaluation, timeout performed, IV checked, risks and benefits discussed and monitors and equipment checked Spinal Block Patient position: sitting Prep: site prepped and draped and DuraPrep Patient monitoring: heart rate, cardiac monitor, continuous pulse ox and blood pressure Approach: midline Location: L3-4 Injection technique: single-shot Needle Needle type: Pencan  Needle gauge: 24 G Needle length: 9 cm Needle insertion depth: 6 cm Assessment Sensory level: T8 Additional Notes Patient tolerated procedure well. Adequate sensory level.

## 2016-02-08 NOTE — Transfer of Care (Signed)
Immediate Anesthesia Transfer of Care Note  Patient: Jason Noble  Procedure(s) Performed: Procedure(s): TOTAL HIP ARTHROPLASTY ANTERIOR APPROACH (Left)  Patient Location: PACU  Anesthesia Type:MAC and Spinal  Level of Consciousness: awake, alert , oriented and patient cooperative  Airway & Oxygen Therapy: Patient Spontanous Breathing  Post-op Assessment: Report given to RN, Post -op Vital signs reviewed and stable and Patient moving all extremities  Post vital signs: Reviewed and stable  Last Vitals:  Filed Vitals:   02/08/16 1045  BP: 183/92  Pulse: 68  Temp: 36.7 C  Resp: 20    Last Pain:  Filed Vitals:   02/08/16 1139  PainSc: 0-No pain      Patients Stated Pain Goal: 2 ([id number])  Complications: No apparent anesthesia complications

## 2016-02-08 NOTE — Discharge Instructions (Signed)

## 2016-02-08 NOTE — Anesthesia Postprocedure Evaluation (Signed)
Anesthesia Post Note  Patient: Jason Noble  Procedure(s) Performed: Procedure(s) (LRB): TOTAL HIP ARTHROPLASTY ANTERIOR APPROACH (Left)  Patient location during evaluation: PACU Anesthesia Type: General Level of consciousness: awake and alert and oriented Pain management: pain level controlled Vital Signs Assessment: post-procedure vital signs reviewed and stable Respiratory status: spontaneous breathing, nonlabored ventilation and respiratory function stable Cardiovascular status: blood pressure returned to baseline and stable Postop Assessment: no signs of nausea or vomiting Anesthetic complications: no                 FOSTER,MICHAEL A.

## 2016-02-09 ENCOUNTER — Encounter (HOSPITAL_COMMUNITY): Payer: Self-pay | Admitting: Orthopedic Surgery

## 2016-02-09 ENCOUNTER — Inpatient Hospital Stay (HOSPITAL_COMMUNITY): Payer: Medicare Other

## 2016-02-09 LAB — BASIC METABOLIC PANEL
Anion gap: 9 (ref 5–15)
BUN: 19 mg/dL (ref 6–20)
CO2: 22 mmol/L (ref 22–32)
Calcium: 8.7 mg/dL — ABNORMAL LOW (ref 8.9–10.3)
Chloride: 104 mmol/L (ref 101–111)
Creatinine, Ser: 1.35 mg/dL — ABNORMAL HIGH (ref 0.61–1.24)
GFR calc Af Amer: 58 mL/min — ABNORMAL LOW (ref >60)
GFR calc non Af Amer: 50 mL/min — ABNORMAL LOW (ref >60)
Glucose, Bld: 169 mg/dL — ABNORMAL HIGH (ref 65–99)
Potassium: 4.3 mmol/L (ref 3.5–5.1)
Sodium: 135 mmol/L (ref 135–145)

## 2016-02-09 LAB — CBC
HCT: 38 % — ABNORMAL LOW (ref 39.0–52.0)
Hemoglobin: 12.3 g/dL — ABNORMAL LOW (ref 13.0–17.0)
MCH: 30 pg (ref 26.0–34.0)
MCHC: 32.4 g/dL (ref 30.0–36.0)
MCV: 92.7 fL (ref 78.0–100.0)
Platelets: 130 10*3/uL — ABNORMAL LOW (ref 150–400)
RBC: 4.1 MIL/uL — ABNORMAL LOW (ref 4.22–5.81)
RDW: 13.7 % (ref 11.5–15.5)
WBC: 9.1 10*3/uL (ref 4.0–10.5)

## 2016-02-09 MED ORDER — IOPAMIDOL (ISOVUE-300) INJECTION 61%
INTRAVENOUS | Status: AC
  Administered 2016-02-09: 75 mL
  Filled 2016-02-09: qty 75

## 2016-02-09 NOTE — Evaluation (Signed)
Physical Therapy Evaluation Patient Details Name: Jason Noble MRN: [id number] DOB: April 17, 1941 Today's Date: 02/09/2016   History of Present Illness  75 y.o. male now s/p TOTAL HIP ARTHROPLASTY ANTERIOR APPROACH (Left). PMH includes Malignant neoplasm of base of tongue, Aneurysm of thoracic aorta, MI, prostate cancer, stroke.   Clinical Impression  Pt is s/p anterior THA resulting in the deficits listed below (see PT Problem List).  Pt will benefit from skilled PT to increase their independence and safety with mobility to allow discharge to home with family support. Will progress mobility as tolerated.      Follow Up Recommendations Home health PT;Supervision for mobility/OOB    Equipment Recommendations  Rolling walker with 5" wheels    Recommendations for Other Services       Precautions / Restrictions Precautions Precautions: Fall Restrictions Weight Bearing Restrictions: Yes RLE Weight Bearing: Weight bearing as tolerated      Mobility  Bed Mobility Overal bed mobility: Needs Assistance Bed Mobility: Supine to Sit     Supine to sit: Min assist;HOB elevated    General bed mobility comments: pt using rail to assist  Transfers Overall transfer level: Needs assistance Equipment used: Rolling walker (2 wheeled) Transfers: Sit to/from Stand Sit to Stand: Min assist         General transfer comment: from bed, cues for hand position  Ambulation/Gait Ambulation/Gait assistance: Min guard Ambulation Distance (Feet): 12 Feet Assistive device: Rolling walker (2 wheeled) Gait Pattern/deviations: Step-to pattern;Decreased weight shift to left Gait velocity: decreased   General Gait Details: encouraging weightbearing through LLE  Stairs            Wheelchair Mobility    Modified Rankin (Stroke Patients Only)       Balance Overall balance assessment: Needs assistance Sitting-balance support: No upper extremity supported Sitting balance-Leahy  Scale: Good     Standing balance support: Bilateral upper extremity supported Standing balance-Leahy Scale: Poor Standing balance comment: using rw for support                             Pertinent Vitals/Pain Pain Assessment: 0-10 Pain Score: 5  Pain Location: Lt hip Pain Descriptors / Indicators: Aching Pain Intervention(s): Limited activity within patient's tolerance;Monitored during session;Ice applied    Home Living Family/patient expects to be discharged to:: Private residence Living Arrangements: Spouse/significant other Available Help at Discharge: Family;Available 24 hours/day Type of Home: House Home Access: Stairs to enter Entrance Stairs-Rails: None Entrance Stairs-Number of Steps: 2 Home Layout: Two level       Prior Function Level of Independence: Independent               Hand Dominance        Extremity/Trunk Assessment   Upper Extremity Assessment: Defer to OT evaluation           Lower Extremity Assessment: LLE deficits/detail   LLE Deficits / Details: assist needed for bed mobility with LLE     Communication   Communication: HOH (wears hearing aids)  Cognition Arousal/Alertness: Awake/alert Behavior During Therapy: WFL for tasks assessed/performed Overall Cognitive Status: Within Functional Limits for tasks assessed                      General Comments      Exercises        Assessment/Plan    PT Assessment Patient needs continued PT services  PT Diagnosis Difficulty walking   PT  Problem List Decreased strength;Decreased range of motion;Decreased activity tolerance;Decreased balance;Decreased mobility  PT Treatment Interventions DME instruction;Gait training;Stair training;Functional mobility training;Therapeutic activities;Balance training;Therapeutic exercise;Patient/family education   PT Goals (Current goals can be found in the Care Plan section) Acute Rehab PT Goals Patient Stated Goal: get back  home and moving again PT Goal Formulation: With patient Time For Goal Achievement: 02/23/16 Potential to Achieve Goals: Good    Frequency 7X/week   Barriers to discharge        Co-evaluation               End of Session Equipment Utilized During Treatment: Gait belt Activity Tolerance: Patient tolerated treatment well Patient left: in chair;with call bell/phone within reach Nurse Communication: Mobility status;Weight bearing status         Time: [id number] PT Time Calculation (min) (ACUTE ONLY): 35 min   Charges:   PT Evaluation $PT Eval Moderate Complexity: 1 Procedure PT Treatments $Gait Training: 8-22 mins   PT G Codes:        Cassell Clement, PT, CSCS Pager [phone] Office [phone]  02/09/2016, 1:27 PM

## 2016-02-09 NOTE — Progress Notes (Signed)
Physical Therapy Treatment Patient Details Name: Jason Noble MRN: [id number] DOB: [birth year] Today's Date: 02/09/2016    History of Present Illness 75 y.o. male now s/p TOTAL HIP ARTHROPLASTY ANTERIOR APPROACH (Left). PMH includes Malignant neoplasm of base of tongue, Aneurysm of thoracic aorta, MI, prostate cancer, stroke.     PT Comments    Pt making progress with PT regarding mobility. Pt able to ambulate 150 ft with rw and min guard assistance. PT to continue to follow and progress with anticipation of D/C to home with family support.   Follow Up Recommendations  Home health PT;Supervision for mobility/OOB     Equipment Recommendations  Rolling walker with 5" wheels    Recommendations for Other Services       Precautions / Restrictions Precautions Precautions: Fall Restrictions Weight Bearing Restrictions: Yes RLE Weight Bearing: Weight bearing as tolerated    Mobility  Bed Mobility      General bed mobility comments: sitting EOB upon arrival  Transfers Overall transfer level: Needs assistance Equipment used: Rolling walker (2 wheeled) Transfers: Sit to/from Stand Sit to Stand: Min guard         General transfer comment: good hand placement  Ambulation/Gait Ambulation/Gait assistance: Min guard Ambulation Distance (Feet): 150 Feet Assistive device: Rolling walker (2 wheeled) Gait Pattern/deviations: Step-through pattern;Decreased step length - left Gait velocity: decreased   General Gait Details: encouraging even strides   Stairs            Wheelchair Mobility    Modified Rankin (Stroke Patients Only)       Balance Overall balance assessment: Needs assistance Sitting-balance support: No upper extremity supported Sitting balance-Leahy Scale: Good     Standing balance support: Bilateral upper extremity supported Standing balance-Leahy Scale: Poor Standing balance comment: using rw                    Cognition  Arousal/Alertness: Awake/alert Behavior During Therapy: WFL for tasks assessed/performed Overall Cognitive Status: Within Functional Limits for tasks assessed                      Exercises Total Joint Exercises Ankle Circles/Pumps: AROM;Both;10 reps Quad Sets: Strengthening;Left;10 reps Gluteal Sets: Strengthening;Both;5 reps    General Comments        Pertinent Vitals/Pain Pain Assessment: 0-10 Pain Score: 3  Pain Location: Lt hip Pain Descriptors / Indicators: Sore Pain Intervention(s): Limited activity within patient's tolerance;Monitored during session    Home Living       Prior Function           PT Goals (current goals can now be found in the care plan section) Acute Rehab PT Goals Patient Stated Goal: get moving again PT Goal Formulation: With patient Time For Goal Achievement: 02/23/16 Potential to Achieve Goals: Good Progress towards PT goals: Progressing toward goals    Frequency  7X/week    PT Plan Current plan remains appropriate    Co-evaluation             End of Session Equipment Utilized During Treatment: Gait belt Activity Tolerance: Patient tolerated treatment well Patient left: in chair;with call bell/phone within reach;with family/visitor present     Time: [id number] PT Time Calculation (min) (ACUTE ONLY): 23 min  Charges:  $Gait Training: 23-37 mins                    G Codes:      Cassell Clement, PT, CSCS Pager [phone]  [phone] Office [phone]  02/09/2016, 2:53 PM

## 2016-02-09 NOTE — Progress Notes (Signed)
PATIENT ID: Jason Noble  MRN: [id number]  DOB/AGE:  1940/09/28 / 75 y.o.  1 Day Post-Op Procedure(s) (LRB): TOTAL HIP ARTHROPLASTY ANTERIOR APPROACH (Left)    PROGRESS NOTE Subjective: Patient is alert, oriented, no Nausea, no Vomiting, yes passing gas, . Taking PO well. Denies SOB, Chest or Calf Pain. Using Incentive Spirometer, PAS in place. Ambulate WBAT Patient reports pain as  2/10  .    Objective: Vital signs in last 24 hours: Filed Vitals:   02/08/16 2154 02/08/16 2216 02/09/16 0210 02/09/16 0657  BP: 115/61 90/56 95/62  117/79  Pulse: 90 86 74 82  Temp: 99.4 F (37.4 C)  98.7 F (37.1 C) 98.2 F (36.8 C)  TempSrc: Oral  Axillary Oral  Resp: 15     Weight:      SpO2: 94% 91% 94% 95%      Intake/Output from previous day: I/O last 3 completed shifts: In: 1800 [I.V.:1800] Out: 1150 [Urine:650; Blood:500]   Intake/Output this shift:     LABORATORY DATA:  Recent Labs  02/09/16 0655  WBC 9.1  HGB 12.3*  HCT 38.0*  PLT 130*  NA 135  K 4.3  CL 104  CO2 22  BUN 19  CREATININE 1.35*  GLUCOSE 169*  CALCIUM 8.7*    Examination: Neurologically intact ABD soft Neurovascular intact Sensation intact distally Intact pulses distally Dorsiflexion/Plantar flexion intact Incision: dressing C/D/I No cellulitis present Compartment soft} XR AP&Lat of hip shows well placed\fixed THA  Assessment:   1 Day Post-Op Procedure(s) (LRB): TOTAL HIP ARTHROPLASTY ANTERIOR APPROACH (Left) ADDITIONAL DIAGNOSIS:  Expected Acute Blood Loss Anemia, Hx MI, Stents,HTN  Plan: PT/OT WBAT, THA, CT today for nodule on pre-op CXR, prob benign  DVT Prophylaxis: SCDx72 hrs, ASA 325 mg BID x 2 weeks  DISCHARGE PLAN: Home  DISCHARGE NEEDS: HHPT, Walker and 3-in-1 comode seat

## 2016-02-09 NOTE — Progress Notes (Signed)
Utilization review completed.  

## 2016-02-09 NOTE — Progress Notes (Signed)
Occupational Therapy Evaluation Patient Details Name: Jason Noble MRN: [id number] DOB: April 26, 1941 Today's Date: 02/09/2016    History of Present Illness s/p TOTAL HIP ARTHROPLASTY ANTERIOR APPROACH (Left). PMH includes Malignant neoplasm of base of tongue, Aneurysm of thoracic aorta, MI, prostate cancer, stroke.    Clinical Impression   Pt admitted with the above diagnoses and presents with below problem list. Pt will benefit from continued acute OT to address the below listed deficits and maximize independence with BADLs prior to d/c home with family. PTA pt was independent with ADLs. Pt is currently min guard to min A with LB ADLs and functional mobility.      Follow Up Recommendations  Supervision/Assistance - 24 hour;No OT follow up    Equipment Recommendations  None recommended by OT    Recommendations for Other Services       Precautions / Restrictions Restrictions Weight Bearing Restrictions: Yes RLE Weight Bearing: Weight bearing as tolerated      Mobility Bed Mobility Overal bed mobility: Needs Assistance Bed Mobility: Supine to Sit;Sit to Supine     Supine to sit: Min assist;HOB elevated Sit to supine: Min assist   General bed mobility comments: Assist to advance LLE. Pt using RLE to assist LLE back onto bed.   Transfers Overall transfer level: Needs assistance Equipment used: Rolling walker (2 wheeled) Transfers: Sit to/from Stand Sit to Stand: Min guard;Min assist         General transfer comment: Light assist to steady balance    Balance Overall balance assessment: Needs assistance Sitting-balance support: No upper extremity supported;Feet supported Sitting balance-Leahy Scale: Good     Standing balance support: Bilateral upper extremity supported;During functional activity Standing balance-Leahy Scale: Fair Standing balance comment: rw used for balance during session                            ADL Overall ADL's : Needs  assistance/impaired Eating/Feeding: Set up;Sitting   Grooming: Set up;Sitting;Min guard;Standing   Upper Body Bathing: Set up;Sitting   Lower Body Bathing: Minimal assistance;Sit to/from stand   Upper Body Dressing : Set up;Sitting   Lower Body Dressing: Minimal assistance;Sit to/from stand   Toilet Transfer: Minimal assistance;Ambulation;RW (3n1 over toilet)   Toileting- Clothing Manipulation and Hygiene: Min guard;Sitting/lateral lean   Tub/ Shower Transfer: Min guard;Minimal assistance;Ambulation;3 in 1;Rolling walker   Functional mobility during ADLs: Min guard;Minimal assistance;Rolling walker General ADL Comments: Pt completed functional mobility to/from bathroom, stood at sink to wash hands and completed bed mobility as detailed above and below. Weakness noted in LLE. Reviewed ADL education. Spouse present and recently had hip surgery.      Vision     Perception     Praxis      Pertinent Vitals/Pain Pain Assessment: 0-10 Pain Score: 3  Pain Location: Left hip during OOB/mobility Pain Descriptors / Indicators: Aching;Sore Pain Intervention(s): Monitored during session;Repositioned;Limited activity within patient's tolerance     Hand Dominance     Extremity/Trunk Assessment Upper Extremity Assessment Upper Extremity Assessment: Overall WFL for tasks assessed   Lower Extremity Assessment Lower Extremity Assessment: Defer to PT evaluation       Communication Communication Communication: No difficulties;HOH   Cognition Arousal/Alertness: Awake/alert Behavior During Therapy: WFL for tasks assessed/performed Overall Cognitive Status: Within Functional Limits for tasks assessed                     General Comments  Exercises       Shoulder Instructions      Home Living Family/patient expects to be discharged to:: Private residence Living Arrangements: Spouse/significant other Available Help at Discharge: Family;Available 24  hours/day Type of Home: House Home Access: Stairs to enter [employer] of Steps: 2 Entrance Stairs-Rails: None Home Layout: Two level Alternate Level Stairs-Number of Steps: split full length Alternate Level Stairs-Rails: Left Bathroom Shower/Tub: Walk-in shower         Home Equipment: Grab bars - toilet;Walker - 2 wheels;Bedside commode          Prior Functioning/Environment Level of Independence: Independent             OT Diagnosis: Acute pain   OT Problem List: Impaired balance (sitting and/or standing);Decreased knowledge of use of DME or AE;Decreased knowledge of precautions;Pain   OT Treatment/Interventions: Self-care/ADL training;DME and/or AE instruction;Therapeutic activities;Patient/family education;Balance training    OT Goals(Current goals can be found in the care plan section) Acute Rehab OT Goals Patient Stated Goal: get up and move around OT Goal Formulation: With patient/family Time For Goal Achievement: 02/16/16 Potential to Achieve Goals: Good ADL Goals Pt Will Perform Lower Body Bathing: with modified independence;sit to/from stand Pt Will Perform Lower Body Dressing: with modified independence;sit to/from stand Pt Will Transfer to Toilet: with modified independence;ambulating (3n1 over toilet) Pt Will Perform Toileting - Clothing Manipulation and hygiene: with modified independence;sitting/lateral leans;sit to/from stand Pt Will Perform Tub/Shower Transfer: Shower transfer;with supervision;ambulating;3 in 1;rolling walker Additional ADL Goal #1: Pt will complete bed mobility at mod I level to prepare for OOB ADLs.   OT Frequency: Min 2X/week   Barriers to D/C:            Co-evaluation              End of Session Equipment Utilized During Treatment: Gait belt;Rolling walker Nurse Communication: Mobility status  Activity Tolerance: Patient tolerated treatment well Patient left: in bed;with call bell/phone within reach;with  family/visitor present   Time: [id number] OT Time Calculation (min): 36 min Charges:  OT General Charges $OT Visit: 1 Procedure OT Evaluation $OT Eval Low Complexity: 1 Procedure OT Treatments $Self Care/Home Management : 8-22 mins G-Codes:    Hortencia Pilar 02/29/16, 12:58 PM

## 2016-02-10 LAB — CBC
HCT: 31.3 % — ABNORMAL LOW (ref 39.0–52.0)
Hemoglobin: 10.3 g/dL — ABNORMAL LOW (ref 13.0–17.0)
MCH: 29.7 pg (ref 26.0–34.0)
MCHC: 32.9 g/dL (ref 30.0–36.0)
MCV: 90.2 fL (ref 78.0–100.0)
Platelets: 100 10*3/uL — ABNORMAL LOW (ref 150–400)
RBC: 3.47 MIL/uL — ABNORMAL LOW (ref 4.22–5.81)
RDW: 13.6 % (ref 11.5–15.5)
WBC: 8.5 10*3/uL (ref 4.0–10.5)

## 2016-02-10 MED ORDER — TRAMADOL HCL 50 MG PO TABS: 50.0000 mg | ORAL_TABLET | Freq: Four times a day (QID) | ORAL | Status: DC | PRN

## 2016-02-10 MED ORDER — SENNOSIDES-DOCUSATE SODIUM 8.6-50 MG PO TABS: 1.0000 | ORAL_TABLET | Freq: Two times a day (BID) | ORAL | Status: DC

## 2016-02-10 NOTE — Progress Notes (Signed)
Discharge instructions given. Pt verbalized understanding and all questions were answered.  

## 2016-02-10 NOTE — Progress Notes (Signed)
Physical Therapy Treatment Patient Details Name: Jason Noble MRN: [id number] DOB: 09/25/1940 Today's Date: 02/10/2016    History of Present Illness 75 y.o. male now s/p TOTAL HIP ARTHROPLASTY ANTERIOR APPROACH (Left). PMH includes Malignant neoplasm of base of tongue, Aneurysm of thoracic aorta, MI, prostate cancer, stroke.     PT Comments    Patient is making good progress with PT.  From a mobility standpoint anticipate patient will be ready for DC home with family assistance. Patient denies any questions or concerns following session.     Follow Up Recommendations  Home health PT;Supervision for mobility/OOB     Equipment Recommendations  Rolling walker with 5" wheels    Recommendations for Other Services       Precautions / Restrictions Precautions Precautions: Fall Restrictions Weight Bearing Restrictions: Yes RLE Weight Bearing: Weight bearing as tolerated    Mobility  Bed Mobility               General bed mobility comments: up in chair upon arrival  Transfers Overall transfer level: Modified independent Equipment used: Rolling walker (2 wheeled) Transfers: Sit to/from Stand Sit to Stand: Modified independent (Device/Increase time)         General transfer comment: safe technique utilized with rw from chair.   Ambulation/Gait Ambulation/Gait assistance: Supervision Ambulation Distance (Feet): 250 Feet Assistive device: Rolling walker (2 wheeled) Gait Pattern/deviations: Step-through pattern Gait velocity: decreased   General Gait Details: even strides, no loss of balance.   Stairs Stairs: Yes Stairs assistance: Min guard Stair Management: One rail Right;Step to pattern;Sideways Number of Stairs: 15 General stair comments: Up/down 15 steps, mild instability through LLE with descending stairs but improved with cues for full knee extension.   Wheelchair Mobility    Modified Rankin (Stroke Patients Only)       Balance Overall  balance assessment: Needs assistance Sitting-balance support: No upper extremity supported Sitting balance-Leahy Scale: Good     Standing balance support: During functional activity Standing balance-Leahy Scale: Fair Standing balance comment: using rw during ambulation                    Cognition Arousal/Alertness: Awake/alert Behavior During Therapy: WFL for tasks assessed/performed Overall Cognitive Status: Within Functional Limits for tasks assessed                      Exercises      General Comments        Pertinent Vitals/Pain Pain Assessment: No/denies pain Pain Location:  (reports feeling sore but not pain) Pain Intervention(s): Monitored during session    Home Living                      Prior Function            PT Goals (current goals can now be found in the care plan section) Acute Rehab PT Goals Patient Stated Goal: get moving again PT Goal Formulation: With patient Time For Goal Achievement: 02/23/16 Potential to Achieve Goals: Good Progress towards PT goals: Progressing toward goals    Frequency  7X/week    PT Plan Current plan remains appropriate    Co-evaluation             End of Session Equipment Utilized During Treatment: Gait belt Activity Tolerance: Patient tolerated treatment well Patient left: in chair;with call bell/phone within reach;with family/visitor present     Time: [id number] PT Time Calculation (min) (ACUTE ONLY): 20 min  Charges:  $  Gait Training: 8-22 mins                    G Codes:      Cassell Clement, PT, CSCS Pager [phone] Office [phone]  02/10/2016, 10:07 AM

## 2016-02-10 NOTE — Progress Notes (Signed)
PATIENT ID: Jason Noble  MRN: [id number]  DOB/AGE:  75-Apr-1942 / 75 y.o.  2 Days Post-Op Procedure(s) (LRB): TOTAL HIP ARTHROPLASTY ANTERIOR APPROACH (Left)    PROGRESS NOTE Subjective: Patient is alert, oriented, no Nausea, no Vomiting, yes passing gas, . Taking PO well. Denies SOB, Chest or Calf Pain. Using Incentive Spirometer, PAS in place. Ambulate WBAT with pt walking 150 ft Patient reports pain as  4-5/10  .    Objective: Vital signs in last 24 hours: Filed Vitals:   02/09/16 1008 02/09/16 1020 02/09/16 1500 02/10/16 0536  BP:   143/81 144/82  Pulse:   82 68  Temp:   98.2 F (36.8 C) 98.4 F (36.9 C)  TempSrc:   Oral Oral  Resp:   16 18  Weight:      SpO2: 95% 96% 93% 98%      Intake/Output from previous day: I/O last 3 completed shifts: In: 720 [P.O.:720] Out: 600 [Urine:600]   Intake/Output this shift:     LABORATORY DATA:  Recent Labs  02/09/16 0655 02/10/16 0643  WBC 9.1 8.5  HGB 12.3* 10.3*  HCT 38.0* 31.3*  PLT 130* PENDING  NA 135  --   K 4.3  --   CL 104  --   CO2 22  --   BUN 19  --   CREATININE 1.35*  --   GLUCOSE 169*  --   CALCIUM 8.7*  --     Examination: Neurologically intact Neurovascular intact Sensation intact distally Intact pulses distally Dorsiflexion/Plantar flexion intact Incision: dressing C/D/I No cellulitis present Compartment soft} XR AP&Lat of hip shows well placed\fixed THA  Assessment:   2 Days Post-Op Procedure(s) (LRB): TOTAL HIP ARTHROPLASTY ANTERIOR APPROACH (Left) ADDITIONAL DIAGNOSIS:  Expected Acute Blood Loss Anemia,  Hx MI, Stents,HTN  Plan: PT/OT WBAT, THA  DVT Prophylaxis: SCDx72 hrs, ASA 325 mg BID x 2 weeks  DISCHARGE PLAN: Home  DISCHARGE NEEDS: HHPT

## 2016-02-10 NOTE — Discharge Summary (Signed)
Patient ID: Jason Noble MRN: [id number] DOB/AGE: 02/07/1941 75 y.o.  Admit date: 02/08/2016 Discharge date: 02/10/2016  Admission Diagnoses:  Active Problems:   Primary osteoarthritis of left hip   Discharge Diagnoses:  Same  Past Medical History  Diagnosis Date  . Cholecystitis   . Abscess of gallbladder   . CVA (cerebral infarction) 2007    "mild"  . Coronary heart disease     has a stent  . History of radiation therapy 06/18/2011- 08/03/2011    squamous cell tongue/69.96 Gy 50fx  . Fatigue 07/26/2011  . Myocardial infarction ([place])   . PFO (patent foramen ovale)   . Hypercholesteremia     under control  . History of cancer chemotherapy   . Stroke ([place])   . Broken jaw ([place])     right lower   . Numbness     right lower   . Peripheral vascular disease ([place])   . Aneurysm of thoracic aorta ([facility])     4.5 cm   . Numbness and tingling in left hand     numbess 2 fingers   . 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 Skin cancer     basal cell, squamous cell  . Prostate cancer ([place]) 2002  . Basal cell carcinoma of skin   . Squamous cell carcinoma of skin     Surgeries: Procedure(s): TOTAL HIP ARTHROPLASTY ANTERIOR APPROACH on 02/08/2016   Consultants:    Discharged Condition: Improved  Hospital Course: SAHARSH COBARRUBIAS is an 75 y.o. male who was admitted 02/08/2016 for operative treatment of<principal problem not specified>. Patient has severe unremitting pain that affects sleep, daily activities, and work/hobbies. After pre-op clearance the patient was taken to the operating room on 02/08/2016 and underwent  Procedure(s): TOTAL HIP ARTHROPLASTY ANTERIOR APPROACH.    Patient was given perioperative antibiotics: Anti-infectives    Start     Dose/Rate Route Frequency Ordered Stop   02/08/16 1200  ceFAZolin (ANCEF) IVPB 2g/100 mL premix     2 g 200 mL/hr over 30 Minutes Intravenous To [facility] 02/07/16 0746 02/08/16 1325       Patient was  given sequential compression devices, early ambulation, and chemoprophylaxis to prevent DVT.  Patient benefited maximally from hospital stay and there were no complications.    Recent vital signs: Patient Vitals for the past 24 hrs:  BP Temp Temp src Pulse Resp SpO2  02/10/16 0536 (!) 144/82 mmHg 98.4 F (36.9 C) Oral 68 18 98 %  02/09/16 1500 (!) 143/81 mmHg 98.2 F (36.8 C) Oral 82 16 93 %  02/09/16 1020 - - - - - 96 %  02/09/16 1008 - - - - - 95 %  02/09/16 0900 112/76 mmHg 98.1 F (36.7 C) Oral 85 16 96 %     Recent laboratory studies:  Recent Labs  02/09/16 0655 02/10/16 0643  WBC 9.1 8.5  HGB 12.3* 10.3*  HCT 38.0* 31.3*  PLT 130* PENDING  NA 135  --   K 4.3  --   CL 104  --   CO2 22  --   BUN 19  --   CREATININE 1.35*  --   GLUCOSE 169*  --   CALCIUM 8.7*  --      Discharge Medications:     Medication List    STOP taking these medications        aspirin 325 MG tablet  Replaced by:  aspirin EC 325 MG tablet  TAKE these medications        aspirin EC 325 MG tablet  Take 1 tablet (325 mg total) by mouth 2 (two) times daily.     atorvastatin 40 MG tablet  Commonly known as:  LIPITOR  Take 1 tablet 5 times a week     calcium carbonate 600 MG Tabs tablet  Commonly known as:  OS-CAL  Take 1,200 mg by mouth every evening.     cholecalciferol 1000 units tablet  Commonly known as:  VITAMIN D  Take 1,000 Units by mouth daily.     fish oil-omega-3 fatty acids 1000 MG capsule  Take 1 g by mouth daily.     HYALURONIC ACID PO  Take 100 mg by mouth daily.     L-Glutamine 500 MG Tabs  Take 500 mg by mouth every evening.     Lecithin 1200 MG Caps  Take 1,200 mg by mouth daily.     multivitamin with minerals Tabs tablet  Take 1 tablet by mouth daily.     niacinamide 500 MG tablet  Take 500 mg by mouth every evening.     OSTEO BI-FLEX JOINT SHIELD PO  Take 100 mg by mouth every evening. Pt not sure of dosage.     OVER THE COUNTER MEDICATION   Take 20 mg by mouth daily. Lutigold     oxyCODONE-acetaminophen 5-325 MG tablet  Commonly known as:  ROXICET  Take 1 tablet by mouth every 4 (four) hours as needed.     pyridOXINE 50 MG tablet  Commonly known as:  VITAMIN B-6  Take 50 mg by mouth daily.     senna-docusate 8.6-50 MG tablet  Commonly known as:  Senokot-S  Take 1 tablet by mouth 2 (two) times daily.     Sodium Fluoride 1.1 % Pste  Apply thin ribbon of cream to toothbrush. Brush teeth for 2 minutes at bedtime. Spit out excess. Do not swallow.     tiZANidine 2 MG tablet  Commonly known as:  ZANAFLEX  Take 1 tablet (2 mg total) by mouth every 6 (six) hours as needed for muscle spasms.     traMADol 50 MG tablet  Commonly known as:  ULTRAM  Take 1 tablet (50 mg total) by mouth every 6 (six) hours as needed.     tretinoin 0.025 % cream  Commonly known as:  RETIN-A  Apply 1 application topically daily as needed (apply to face rash). As Directed     Turmeric 450 MG Caps  Take 450 mg by mouth daily.     UBIQUINOL PO  Take 100 mg by mouth daily.     VITAMIN A PO  Take 1 tablet by mouth daily.     VITAMIN B50 COMPLEX PO  Take 50 mg by mouth daily.     vitamin C 500 MG tablet  Commonly known as:  ASCORBIC ACID  Take 500 mg by mouth 2 (two) times daily.     vitamin E 400 UNIT capsule  Take 400 Units by mouth every morning. Reported on 11/30/2015     Zinc 25 MG Tabs  Take 25 mg by mouth daily.        Diagnostic Studies: Dg Chest 2 View  01/26/2016  CLINICAL DATA:  Preop for left hip replacement EXAM: CHEST  2 VIEW COMPARISON:  Chest x-ray of 03/11/2012 FINDINGS: No active infiltrate or effusion is seen. However, at the site described previously at the left lung base, there is a nodular opacity remaining overlying  some left breast tissue and left posterior tenth rib. This may represent a lung nodule located posteriorly in the left lower lobe on the lateral view and CT of the chest is recommended to evaluate  further. The lungs are slightly hyperaerated. Mediastinal and hilar contours are unchanged and the heart is mildly enlarged. The descending thoracic aorta remains ectatic. No acute bony abnormality is seen. IMPRESSION: 1. Cannot exclude a subtle lung nodule at the left lung base posteriorly within the left lower lobe. Recommend CT of the chest to assess further as noted above. 2. Stable mild cardiomegaly and ectatic descending thoracic aorta. Electronically Signed   By: Ivar Drape M.D.   On: 01/26/2016 09:54   Ct Chest W Contrast  02/09/2016  CLINICAL DATA:  Pulmonary nodule. EXAM: CT CHEST WITH CONTRAST TECHNIQUE: Multidetector CT imaging of the chest was performed during intravenous contrast administration. CONTRAST:  42mL ISOVUE-300 IOPAMIDOL (ISOVUE-300) INJECTION 61% COMPARISON:  Chest radiograph 01/26/2016 FINDINGS: Mediastinum/Lymph Nodes: No masses, pathologically enlarged lymph nodes, or other significant abnormality. There are shotty mediastinal lymph nodes mostly in precarinal location. The heart is mildly enlarged. There is severe atherosclerotic disease of the coronary arteries and aorta. The aorta is torturous. Postsurgical changes from CABG are noted. Lungs/Pleura: There is a peridiaphragmatic subpleural 8 mm nodular thickening in the left lower lobe, which likely accounts for the abnormality seen radiographically, best seen on coronal images, image 23 sequence 5. There is biapical subpleural scarring. Mild linear opacity in the right lung base, likely represents atelectasis versus scarring. There is peripheral thickening of the left minor fissure. Upper abdomen: No acute findings. Musculoskeletal: There is an 8 mm sclerotic focus within the right hand aspect of T11 vertebral body. Multilevel osteoarthritic changes of the thoracic spine are seen. IMPRESSION: 8 mm subpleural thickening in the left lower lobe, likely represent for the ability seen radiographically. Non-contrast chest CT at 6-12  months is recommended. If the nodule is stable at time of repeat CT, then future CT at 18-24 months (from today's scan) is considered optional for low-risk patients, but is recommended for high-risk patients. This recommendation follows the consensus statement: Guidelines for Management of Incidental Pulmonary Nodules Detected on CT Images:From the Fleischner Society 2017; published online before print (10.1148/radiol.IJ:2314499). No evidence of mediastinal lymphadenopathy. Atherosclerotic disease of the aorta and coronary arteries. Unfolded aorta. Electronically Signed   By: Fidela Salisbury M.D.   On: 02/09/2016 13:30   Dg Hip Operative Unilat W Or W/o Pelvis Left  02/08/2016  CLINICAL DATA:  Left total hip replacement. EXAM: OPERATIVE left HIP (WITH PELVIS IF PERFORMED) 2 VIEWS TECHNIQUE: Fluoroscopic spot image(s) were submitted for interpretation post-operatively. FLUOROSCOPY TIME:  46 seconds. COMPARISON:  None. FINDINGS: The femoral and acetabular components appear to be well positioned. No fracture or dislocation is noted. IMPRESSION: Status post left total hip arthroplasty. Electronically Signed   By: Marijo Conception, M.D.   On: 02/08/2016 15:11    Disposition: 01-Home or Self Care      Discharge Instructions    Call MD / Call 911    Complete by:  As directed   If you experience chest pain or shortness of breath, CALL 911 and be transported to the hospital emergency room.  If you develope a fever above 101 F, pus (white drainage) or increased drainage or redness at the wound, or calf pain, call your surgeon's office.     Constipation Prevention    Complete by:  As directed   Drink plenty of  fluids.  Prune juice may be helpful.  You may use a stool softener, such as Colace (over the counter) 100 mg twice a day.  Use MiraLax (over the counter) for constipation as needed.     Diet - low sodium heart healthy    Complete by:  As directed      Driving restrictions    Complete by:  As  directed   No driving for 2 weeks     Follow the hip precautions as taught in Physical Therapy    Complete by:  As directed      Increase activity slowly as tolerated    Complete by:  As directed      Patient may shower    Complete by:  As directed   You may shower without a dressing once there is no drainage.  Do not wash over the wound.  If drainage remains, cover wound with plastic wrap and then shower.           Follow-up Information    Follow up with Kerin Salen, MD In 2 weeks.   Specialty:  Orthopedic Surgery   Contact information:   [place] [id number] [phone]        Signed: Hardin Negus, ERIC R 02/10/2016, 8:25 AM

## 2016-02-11 DIAGNOSIS — I2510 Atherosclerotic heart disease of native coronary artery without angina pectoris: Secondary | ICD-10-CM | POA: Diagnosis not present

## 2016-02-11 DIAGNOSIS — I739 Peripheral vascular disease, unspecified: Secondary | ICD-10-CM | POA: Diagnosis not present

## 2016-02-11 DIAGNOSIS — H9190 Unspecified hearing loss, unspecified ear: Secondary | ICD-10-CM | POA: Diagnosis not present

## 2016-02-11 DIAGNOSIS — Z471 Aftercare following joint replacement surgery: Secondary | ICD-10-CM | POA: Diagnosis not present

## 2016-02-11 DIAGNOSIS — I10 Essential (primary) hypertension: Secondary | ICD-10-CM | POA: Diagnosis not present

## 2016-02-11 DIAGNOSIS — I252 Old myocardial infarction: Secondary | ICD-10-CM | POA: Diagnosis not present

## 2016-02-14 DIAGNOSIS — I2510 Atherosclerotic heart disease of native coronary artery without angina pectoris: Secondary | ICD-10-CM | POA: Diagnosis not present

## 2016-02-14 DIAGNOSIS — I10 Essential (primary) hypertension: Secondary | ICD-10-CM | POA: Diagnosis not present

## 2016-02-14 DIAGNOSIS — I739 Peripheral vascular disease, unspecified: Secondary | ICD-10-CM | POA: Diagnosis not present

## 2016-02-14 DIAGNOSIS — H9190 Unspecified hearing loss, unspecified ear: Secondary | ICD-10-CM | POA: Diagnosis not present

## 2016-02-14 DIAGNOSIS — Z471 Aftercare following joint replacement surgery: Secondary | ICD-10-CM | POA: Diagnosis not present

## 2016-02-14 DIAGNOSIS — I252 Old myocardial infarction: Secondary | ICD-10-CM | POA: Diagnosis not present

## 2016-02-17 DIAGNOSIS — Z471 Aftercare following joint replacement surgery: Secondary | ICD-10-CM | POA: Diagnosis not present

## 2016-02-17 DIAGNOSIS — I252 Old myocardial infarction: Secondary | ICD-10-CM | POA: Diagnosis not present

## 2016-02-17 DIAGNOSIS — I10 Essential (primary) hypertension: Secondary | ICD-10-CM | POA: Diagnosis not present

## 2016-02-17 DIAGNOSIS — H9190 Unspecified hearing loss, unspecified ear: Secondary | ICD-10-CM | POA: Diagnosis not present

## 2016-02-17 DIAGNOSIS — I739 Peripheral vascular disease, unspecified: Secondary | ICD-10-CM | POA: Diagnosis not present

## 2016-02-17 DIAGNOSIS — I2510 Atherosclerotic heart disease of native coronary artery without angina pectoris: Secondary | ICD-10-CM | POA: Diagnosis not present

## 2016-02-20 DIAGNOSIS — I2510 Atherosclerotic heart disease of native coronary artery without angina pectoris: Secondary | ICD-10-CM | POA: Diagnosis not present

## 2016-02-20 DIAGNOSIS — I739 Peripheral vascular disease, unspecified: Secondary | ICD-10-CM | POA: Diagnosis not present

## 2016-02-20 DIAGNOSIS — I252 Old myocardial infarction: Secondary | ICD-10-CM | POA: Diagnosis not present

## 2016-02-20 DIAGNOSIS — H9190 Unspecified hearing loss, unspecified ear: Secondary | ICD-10-CM | POA: Diagnosis not present

## 2016-02-20 DIAGNOSIS — I10 Essential (primary) hypertension: Secondary | ICD-10-CM | POA: Diagnosis not present

## 2016-02-20 DIAGNOSIS — Z471 Aftercare following joint replacement surgery: Secondary | ICD-10-CM | POA: Diagnosis not present

## 2016-02-21 DIAGNOSIS — M25552 Pain in left hip: Secondary | ICD-10-CM | POA: Diagnosis not present

## 2016-02-21 DIAGNOSIS — Z96642 Presence of left artificial hip joint: Secondary | ICD-10-CM | POA: Diagnosis not present

## 2016-02-21 DIAGNOSIS — M25562 Pain in left knee: Secondary | ICD-10-CM | POA: Diagnosis not present

## 2016-02-21 DIAGNOSIS — M1612 Unilateral primary osteoarthritis, left hip: Secondary | ICD-10-CM | POA: Diagnosis not present

## 2016-02-23 DIAGNOSIS — M25552 Pain in left hip: Secondary | ICD-10-CM | POA: Diagnosis not present

## 2016-02-23 DIAGNOSIS — M25652 Stiffness of left hip, not elsewhere classified: Secondary | ICD-10-CM | POA: Diagnosis not present

## 2016-02-23 DIAGNOSIS — Z96642 Presence of left artificial hip joint: Secondary | ICD-10-CM | POA: Diagnosis not present

## 2016-02-28 DIAGNOSIS — Z96642 Presence of left artificial hip joint: Secondary | ICD-10-CM | POA: Diagnosis not present

## 2016-02-28 DIAGNOSIS — M25552 Pain in left hip: Secondary | ICD-10-CM | POA: Diagnosis not present

## 2016-02-28 DIAGNOSIS — M25652 Stiffness of left hip, not elsewhere classified: Secondary | ICD-10-CM | POA: Diagnosis not present

## 2016-03-02 DIAGNOSIS — M25552 Pain in left hip: Secondary | ICD-10-CM | POA: Diagnosis not present

## 2016-03-02 DIAGNOSIS — Z96642 Presence of left artificial hip joint: Secondary | ICD-10-CM | POA: Diagnosis not present

## 2016-03-02 DIAGNOSIS — M25652 Stiffness of left hip, not elsewhere classified: Secondary | ICD-10-CM | POA: Diagnosis not present

## 2016-03-06 DIAGNOSIS — Z96642 Presence of left artificial hip joint: Secondary | ICD-10-CM | POA: Diagnosis not present

## 2016-03-06 DIAGNOSIS — M25552 Pain in left hip: Secondary | ICD-10-CM | POA: Diagnosis not present

## 2016-03-06 DIAGNOSIS — M25652 Stiffness of left hip, not elsewhere classified: Secondary | ICD-10-CM | POA: Diagnosis not present

## 2016-03-08 DIAGNOSIS — M25652 Stiffness of left hip, not elsewhere classified: Secondary | ICD-10-CM | POA: Diagnosis not present

## 2016-03-08 DIAGNOSIS — M25552 Pain in left hip: Secondary | ICD-10-CM | POA: Diagnosis not present

## 2016-03-08 DIAGNOSIS — Z96642 Presence of left artificial hip joint: Secondary | ICD-10-CM | POA: Diagnosis not present

## 2016-03-13 DIAGNOSIS — Z96642 Presence of left artificial hip joint: Secondary | ICD-10-CM | POA: Diagnosis not present

## 2016-03-13 DIAGNOSIS — M25652 Stiffness of left hip, not elsewhere classified: Secondary | ICD-10-CM | POA: Diagnosis not present

## 2016-03-13 DIAGNOSIS — M25552 Pain in left hip: Secondary | ICD-10-CM | POA: Diagnosis not present

## 2016-03-15 DIAGNOSIS — Z96642 Presence of left artificial hip joint: Secondary | ICD-10-CM | POA: Diagnosis not present

## 2016-03-15 DIAGNOSIS — M25652 Stiffness of left hip, not elsewhere classified: Secondary | ICD-10-CM | POA: Diagnosis not present

## 2016-03-15 DIAGNOSIS — M25552 Pain in left hip: Secondary | ICD-10-CM | POA: Diagnosis not present

## 2016-03-19 DIAGNOSIS — M25552 Pain in left hip: Secondary | ICD-10-CM | POA: Diagnosis not present

## 2016-03-19 DIAGNOSIS — M25652 Stiffness of left hip, not elsewhere classified: Secondary | ICD-10-CM | POA: Diagnosis not present

## 2016-03-19 DIAGNOSIS — Z96642 Presence of left artificial hip joint: Secondary | ICD-10-CM | POA: Diagnosis not present

## 2016-03-22 DIAGNOSIS — Z96642 Presence of left artificial hip joint: Secondary | ICD-10-CM | POA: Diagnosis not present

## 2016-03-22 DIAGNOSIS — M25552 Pain in left hip: Secondary | ICD-10-CM | POA: Diagnosis not present

## 2016-03-22 DIAGNOSIS — M25652 Stiffness of left hip, not elsewhere classified: Secondary | ICD-10-CM | POA: Diagnosis not present

## 2016-03-23 NOTE — Progress Notes (Signed)
HPI: FU CAD, previously followed by Dr. Mare Ferrari; s/p CABG in 1991 and SVG BMS in 2008. Echocardiogram April 2016 showed normal LV systolic function, grade 1 diastolic dysfunction, mild left atrial enlargement and mild tricuspid regurgitation. The ascending aorta measured 4.5 cm. Chest CT May 2017 showed subpleural thickening in the left lower lobe and follow-up recommended 6-12 months. Tortuous aorta. Since last seen, the patient denies any dyspnea on exertion, orthopnea, PND, pedal edema, palpitations, syncope or chest pain.   Current Outpatient Prescriptions  Medication Sig Dispense Refill  . aspirin EC 325 MG tablet Take 1 tablet (325 mg total) by mouth 2 (two) times daily. 30 tablet 0  . atorvastatin (LIPITOR) 40 MG tablet Take 1 tablet 5 times a week 70 tablet 2  . B Complex-Biotin-FA (VITAMIN B50 COMPLEX PO) Take 50 mg by mouth daily.    . calcium carbonate (OS-CAL) 600 MG TABS Take 1,200 mg by mouth every evening.     . cholecalciferol (VITAMIN D) 1000 UNITS tablet Take 1,000 Units by mouth daily.    . fish oil-omega-3 fatty acids 1000 MG capsule Take 1 g by mouth daily.     Marland Kitchen Hyaluronic Acid-Vitamin C (HYALURONIC ACID PO) Take 100 mg by mouth daily.    . L-Glutamine 500 MG TABS Take 500 mg by mouth every evening.     . Lecithin 1200 MG CAPS Take 1,200 mg by mouth daily.     . Misc Natural Products (OSTEO BI-FLEX JOINT SHIELD PO) Take 100 mg by mouth every evening. Pt not sure of dosage.    . Multiple Vitamin (MULTIVITAMIN WITH MINERALS) TABS tablet Take 1 tablet by mouth daily.    . niacinamide 500 MG tablet Take 500 mg by mouth every evening.    Marland Kitchen OVER THE COUNTER MEDICATION Take 20 mg by mouth daily. Lutigold    . oxyCODONE-acetaminophen (ROXICET) 5-325 MG tablet Take 1 tablet by mouth every 4 (four) hours as needed. 60 tablet 0  . pyridOXINE (VITAMIN B-6) 50 MG tablet Take 50 mg by mouth daily.    Marland Kitchen senna-docusate (SENOKOT-S) 8.6-50 MG tablet Take 1 tablet by mouth 2 (two)  times daily. 30 tablet 0  . Sodium Fluoride 1.1 % PSTE Apply thin ribbon of cream to toothbrush. Brush teeth for 2 minutes at bedtime. Spit out excess. Do not swallow. 1 Bottle prn  . tiZANidine (ZANAFLEX) 2 MG tablet Take 1 tablet (2 mg total) by mouth every 6 (six) hours as needed for muscle spasms. 60 tablet 0  . traMADol (ULTRAM) 50 MG tablet Take 1 tablet (50 mg total) by mouth every 6 (six) hours as needed. 60 tablet 0  . tretinoin (RETIN-A) 0.025 % cream Apply 1 application topically daily as needed (apply to face rash). As Directed    . Turmeric 450 MG CAPS Take 450 mg by mouth daily.     Marland Kitchen UBIQUINOL PO Take 100 mg by mouth daily.     Marland Kitchen VITAMIN A PO Take 1 tablet by mouth daily.    . vitamin C (ASCORBIC ACID) 500 MG tablet Take 500 mg by mouth 2 (two) times daily.     . vitamin E 400 UNIT capsule Take 400 Units by mouth every morning. Reported on 11/30/2015    . Zinc 25 MG TABS Take 25 mg by mouth daily.     No current facility-administered medications for this visit.   Facility-Administered Medications Ordered in Other Visits  Medication Dose Route Frequency Provider Last Rate  Last Dose  . topical emolient (BIAFINE) emulsion   Topical BID Kyung Rudd, MD         Past Medical History  Diagnosis Date  . Cholecystitis   . Abscess of gallbladder   . CVA (cerebral infarction) 2007    "mild"  . Coronary heart disease     has a stent  . History of radiation therapy 06/18/2011- 08/03/2011    squamous cell tongue/69.96 Gy 63fx  . Fatigue 07/26/2011  . Myocardial infarction ([place])   . PFO (patent foramen ovale)   . Hypercholesteremia     under control  . History of cancer chemotherapy   . Stroke ([place])   . Broken jaw ([place])     right lower   . Numbness     right lower   . Peripheral vascular disease ([place])   . Aneurysm of thoracic aorta (HCC)     4.5 cm   . Numbness and tingling in left hand     numbess 2 fingers   . 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 Skin cancer       basal cell, squamous cell  . Prostate cancer ([place]) 2002  . Basal cell carcinoma of skin   . Squamous cell carcinoma of skin     Past Surgical History  Procedure Laterality Date  . Prostatectomy  2002  . Peg tube placement  06/13/11 at [facility]    10/2011 removed  . Multiple tooth extractions      prior to radiation  . 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 Elbow surgery  42 years ago    removal left radial head due to hockey accident  . Cholecystectomy  04/2009    3 surgeries involved: lap chole x2 laparotomy  . Exploratory laparotomy  04/2010    Dr. Redmond Pulling (3rd surgery for gall bladder)  . Tonsillectomy      x2: as child then 1985 for Left lingual tonsil removed  . Inguinal hernia repair Right 09/23/2014    Procedure: RIGHT INGUINAL HERNIA REPAIR WITH MESH;  Surgeon: Jackolyn Confer, MD;  Location: [facility];  Service: General;  Laterality: Right;  . Inguinal hernia repair Left 09/30/2015    Procedure: HERNIA REPAIR LEFT INGUINAL ADULT;  Surgeon: Jackolyn Confer, MD;  Location: [facility];  Service: General;  Laterality: Left;  . Insertion of mesh Left 09/30/2015    Procedure: INSERTION OF MESH;  Surgeon: Jackolyn Confer, MD;  Location: [facility];  Service: General;  Laterality: Left;  . Total hip arthroplasty Left 02/08/2016    Procedure: TOTAL HIP ARTHROPLASTY ANTERIOR APPROACH;  Surgeon: Frederik Pear, MD;  Location: [place];  Service: Orthopedics;  Laterality: Left;    Social History   Social History  . Marital Status: Married    Spouse Name: N/A  . Number of Children: N/A  . Years of Education: N/A   Occupational History  . Not on file.   Social History Main Topics  . Smoking status: Former Smoker -- 0.25 packs/day for 5 years    Types: Cigarettes    Quit date: 03/20/1966  . Smokeless tobacco: Never Used  . Alcohol Use: No  . Drug Use: No  . Sexual Activity: Not on file   Other Topics Concern  . Not on file   Social History Narrative    Family History  Problem  Relation Age of Onset  . Stroke Mother   . Coronary artery disease Brother  had bypass    ROS: residual left hip pain from recent hip replacement but no fevers or chills, productive cough, hemoptysis, dysphasia, odynophagia, melena, hematochezia, dysuria, hematuria, rash, seizure activity, orthopnea, PND, pedal edema, claudication. Remaining systems are negative.  Physical Exam: Well-developed well-nourished in no acute distress.  Skin is warm and dry.  HEENT is normal.  Neck is supple.  Chest is clear to auscultation with normal expansion.  Cardiovascular exam is regular rate and rhythm.  Abdominal exam nontender or distended. No masses palpated. Extremities show no edema. neuro grossly intact  ECG 01/25/2016-sinus rhythm, left ventricular hypertrophy.  Assessment and plan 1 Coronary artery disease-continue aspirin and statin. 15 minutes spent reviewing previous records prior to pt arrival. 2 hyperlipidemia-increase Lipitor to 40 mg daily. He did not tolerate higher doses than that previously. Check lipids and liver in 4 weeks. 3 History of hypertension-blood pressure is now controlled on no medications. We will follow. 4 History of thoracic aortic aneurysm-Not significantly dilated on most recent CT scan in May. We will plan follow-up echoes in the future. Note there was a small nodule and he will follow up with primary care for this. 5 history of patent foramen ovale with prior CVA-no recurrence. Continue aspirin.  Kirk Ruths, MD

## 2016-03-27 ENCOUNTER — Ambulatory Visit (INDEPENDENT_AMBULATORY_CARE_PROVIDER_SITE_OTHER): Payer: Medicare Other | Admitting: Cardiology

## 2016-03-27 ENCOUNTER — Encounter: Payer: Self-pay | Admitting: Cardiology

## 2016-03-27 VITALS — BP 132/80 | HR 78 | Ht 69.0 in | Wt 174.0 lb

## 2016-03-27 DIAGNOSIS — E785 Hyperlipidemia, unspecified: Secondary | ICD-10-CM | POA: Diagnosis not present

## 2016-03-27 DIAGNOSIS — I2510 Atherosclerotic heart disease of native coronary artery without angina pectoris: Secondary | ICD-10-CM

## 2016-03-27 DIAGNOSIS — I259 Chronic ischemic heart disease, unspecified: Secondary | ICD-10-CM

## 2016-03-27 DIAGNOSIS — M25552 Pain in left hip: Secondary | ICD-10-CM | POA: Diagnosis not present

## 2016-03-27 DIAGNOSIS — Z96642 Presence of left artificial hip joint: Secondary | ICD-10-CM | POA: Diagnosis not present

## 2016-03-27 DIAGNOSIS — M25652 Stiffness of left hip, not elsewhere classified: Secondary | ICD-10-CM | POA: Diagnosis not present

## 2016-03-27 DIAGNOSIS — I10 Essential (primary) hypertension: Secondary | ICD-10-CM | POA: Diagnosis not present

## 2016-03-27 MED ORDER — ATORVASTATIN CALCIUM 40 MG PO TABS: 40.0000 mg | ORAL_TABLET | Freq: Every day | ORAL | Status: DC

## 2016-03-27 NOTE — Patient Instructions (Signed)
Medications Start taking atorvastatin 40mg  daily.   Lab work  Return for lab work in 4 weeks.    Follow-up in 6 months with Dr. Stanford Breed.

## 2016-03-28 ENCOUNTER — Inpatient Hospital Stay: Admit: 2016-03-28 | Payer: No Typology Code available for payment source | Admitting: Orthopedic Surgery

## 2016-03-28 SURGERY — ARTHROPLASTY, HIP, TOTAL, ANTERIOR APPROACH
Anesthesia: Choice | Laterality: Left

## 2016-04-02 DIAGNOSIS — Z96642 Presence of left artificial hip joint: Secondary | ICD-10-CM | POA: Diagnosis not present

## 2016-04-02 DIAGNOSIS — M25652 Stiffness of left hip, not elsewhere classified: Secondary | ICD-10-CM | POA: Diagnosis not present

## 2016-04-02 DIAGNOSIS — M25552 Pain in left hip: Secondary | ICD-10-CM | POA: Diagnosis not present

## 2016-04-05 DIAGNOSIS — M25552 Pain in left hip: Secondary | ICD-10-CM | POA: Diagnosis not present

## 2016-04-05 DIAGNOSIS — M25652 Stiffness of left hip, not elsewhere classified: Secondary | ICD-10-CM | POA: Diagnosis not present

## 2016-04-05 DIAGNOSIS — Z96642 Presence of left artificial hip joint: Secondary | ICD-10-CM | POA: Diagnosis not present

## 2016-04-10 DIAGNOSIS — M25552 Pain in left hip: Secondary | ICD-10-CM | POA: Diagnosis not present

## 2016-04-12 DIAGNOSIS — M25652 Stiffness of left hip, not elsewhere classified: Secondary | ICD-10-CM | POA: Diagnosis not present

## 2016-04-12 DIAGNOSIS — M25552 Pain in left hip: Secondary | ICD-10-CM | POA: Diagnosis not present

## 2016-04-12 DIAGNOSIS — Z96642 Presence of left artificial hip joint: Secondary | ICD-10-CM | POA: Diagnosis not present

## 2016-07-06 DIAGNOSIS — Z23 Encounter for immunization: Secondary | ICD-10-CM | POA: Diagnosis not present

## 2016-07-12 ENCOUNTER — Encounter: Payer: Self-pay | Admitting: *Deleted

## 2016-07-12 ENCOUNTER — Telehealth: Payer: Self-pay | Admitting: *Deleted

## 2016-07-12 NOTE — Telephone Encounter (Signed)
xxxx 

## 2016-07-23 DIAGNOSIS — D1800 Hemangioma unspecified site: Secondary | ICD-10-CM | POA: Diagnosis not present

## 2016-07-23 DIAGNOSIS — L309 Dermatitis, unspecified: Secondary | ICD-10-CM | POA: Diagnosis not present

## 2016-07-23 DIAGNOSIS — Z23 Encounter for immunization: Secondary | ICD-10-CM | POA: Diagnosis not present

## 2016-07-23 DIAGNOSIS — D485 Neoplasm of uncertain behavior of skin: Secondary | ICD-10-CM | POA: Diagnosis not present

## 2016-07-23 DIAGNOSIS — D225 Melanocytic nevi of trunk: Secondary | ICD-10-CM | POA: Diagnosis not present

## 2016-07-23 DIAGNOSIS — L570 Actinic keratosis: Secondary | ICD-10-CM | POA: Diagnosis not present

## 2016-07-24 DIAGNOSIS — D045 Carcinoma in situ of skin of trunk: Secondary | ICD-10-CM | POA: Diagnosis not present

## 2016-07-24 DIAGNOSIS — C44229 Squamous cell carcinoma of skin of left ear and external auricular canal: Secondary | ICD-10-CM | POA: Diagnosis not present

## 2016-07-24 DIAGNOSIS — L821 Other seborrheic keratosis: Secondary | ICD-10-CM | POA: Diagnosis not present

## 2016-08-01 ENCOUNTER — Encounter: Payer: Self-pay | Admitting: Radiation Oncology

## 2016-08-01 ENCOUNTER — Ambulatory Visit
Admission: RE | Admit: 2016-08-01 | Discharge: 2016-08-01 | Disposition: A | Discharge status: Home / Self Care | Payer: Medicare Other | Source: Ambulatory Visit | Attending: Radiation Oncology | Admitting: Radiation Oncology

## 2016-08-01 VITALS — BP 153/97 | HR 78 | Temp 98.2°F | Resp 20 | Ht 69.0 in | Wt 172.2 lb

## 2016-08-01 DIAGNOSIS — Z79899 Other long term (current) drug therapy: Secondary | ICD-10-CM | POA: Diagnosis not present

## 2016-08-01 DIAGNOSIS — Z7982 Long term (current) use of aspirin: Secondary | ICD-10-CM | POA: Diagnosis not present

## 2016-08-01 DIAGNOSIS — Z9221 Personal history of antineoplastic chemotherapy: Secondary | ICD-10-CM | POA: Insufficient documentation

## 2016-08-01 DIAGNOSIS — Z8546 Personal history of malignant neoplasm of prostate: Secondary | ICD-10-CM | POA: Diagnosis not present

## 2016-08-01 DIAGNOSIS — Z923 Personal history of irradiation: Secondary | ICD-10-CM | POA: Insufficient documentation

## 2016-08-01 DIAGNOSIS — Z951 Presence of aortocoronary bypass graft: Secondary | ICD-10-CM | POA: Insufficient documentation

## 2016-08-01 DIAGNOSIS — Z8249 Family history of ischemic heart disease and other diseases of the circulatory system: Secondary | ICD-10-CM | POA: Diagnosis not present

## 2016-08-01 DIAGNOSIS — Z85810 Personal history of malignant neoplasm of tongue: Secondary | ICD-10-CM | POA: Insufficient documentation

## 2016-08-01 DIAGNOSIS — I252 Old myocardial infarction: Secondary | ICD-10-CM | POA: Insufficient documentation

## 2016-08-01 DIAGNOSIS — C01 Malignant neoplasm of base of tongue: Secondary | ICD-10-CM

## 2016-08-01 DIAGNOSIS — Z823 Family history of stroke: Secondary | ICD-10-CM | POA: Diagnosis not present

## 2016-08-01 DIAGNOSIS — Z85828 Personal history of other malignant neoplasm of skin: Secondary | ICD-10-CM | POA: Insufficient documentation

## 2016-08-01 DIAGNOSIS — E7800 Pure hypercholesterolemia, unspecified: Secondary | ICD-10-CM | POA: Diagnosis not present

## 2016-08-01 DIAGNOSIS — Z8673 Personal history of transient ischemic attack (TIA), and cerebral infarction without residual deficits: Secondary | ICD-10-CM | POA: Diagnosis not present

## 2016-08-01 DIAGNOSIS — Z87891 Personal history of nicotine dependence: Secondary | ICD-10-CM | POA: Insufficient documentation

## 2016-08-01 DIAGNOSIS — Z08 Encounter for follow-up examination after completed treatment for malignant neoplasm: Secondary | ICD-10-CM | POA: Diagnosis not present

## 2016-08-01 MED ORDER — LARYNGOSCOPY SOLUTION RAD-ONC
15.0000 mL | Freq: Once | TOPICAL | Status: AC
  Administered 2016-08-01: 15 mL via TOPICAL
  Filled 2016-08-01: qty 15

## 2016-08-01 NOTE — Progress Notes (Signed)
Radiation Oncology         [phone] ________________________________  Name: Jason Noble MRN: [id number]  Date: 08/01/2016  DOB: July 20, 1941  Follow-Up Visit Note  CC: Shirline Frees, MD  Izora Gala, MD  Diagnosis:   Squamous cell carcinoma of the base of tongue  Interval Since Last Radiation:  5 years  Treated from 06/18/11 - 08/03/11 to a dose of 69.96 Gy in 33 fractions at 2.12 Gy per fraction.   Narrative:  The patient returns today for routine follow-up. His last ENT MD Dr. Constance Holster, though the patient doesn't remember his last visit. His last TSH and T4 were done 04/27/14; TSH=3.131, T4=1.02.           On review of systems, the patient states he is swallowing without difficulty, and eating most foods. He denies any pain. The patient is without complaint.                        Past Medical History:  Past Medical History:  Diagnosis Date  . Abscess of gallbladder   . 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 Basal cell carcinoma of skin   . Broken jaw ([place])    right lower   . Cholecystitis   . Coronary heart disease    has a stent  . CVA (cerebral infarction) 2007   "mild"  . Fatigue 07/26/2011  . History of cancer chemotherapy   . History of radiation therapy 06/18/2011- 08/03/2011   squamous cell tongue/69.96 Gy 76fx  . Hypercholesteremia    under control  . Myocardial infarction   . Numbness    right lower   . Numbness and tingling in left hand    numbess 2 fingers   . 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 Peripheral vascular disease ([place])   . PFO (patent foramen ovale)   . Prostate cancer ([place]) 2002  . Skin cancer    basal cell, squamous cell  . Squamous cell carcinoma of skin   . Stroke [facility])     Past Surgical History: Past Surgical History:  Procedure Laterality Date  . CHOLECYSTECTOMY  04/2009   3 surgeries involved: lap chole x2 laparotomy  . CORONARY ARTERY BYPASS GRAFT  1991   triple  . ELBOW SURGERY  42 years ago   removal left radial head due to hockey accident  . EXPLORATORY LAPAROTOMY  04/2010   Dr. Redmond Pulling (3rd surgery for gall bladder)  . INGUINAL HERNIA REPAIR Right 09/23/2014   Procedure: RIGHT INGUINAL HERNIA REPAIR WITH MESH;  Surgeon: Jackolyn Confer, MD;  [facility];  Service: General;  Laterality: Right;  . INGUINAL HERNIA REPAIR Left 09/30/2015   Procedure: HERNIA REPAIR LEFT INGUINAL ADULT;  Surgeon: Jackolyn Confer, MD;  [facility];  Service: General;  Laterality: Left;  . INSERTION OF MESH Left 09/30/2015   Procedure: INSERTION OF MESH;  Surgeon: Jackolyn Confer, MD;  [facility];  Service: General;  Laterality: Left;  Marland Kitchen MULTIPLE TOOTH EXTRACTIONS     prior to radiation  . PEG TUBE PLACEMENT  06/13/11 at [facility]   10/2011 removed  . PROSTATECTOMY  2002  . TONSILLECTOMY     x2: as child then 1985 for Left lingual tonsil removed  . TOTAL HIP ARTHROPLASTY Left 02/08/2016   Procedure: TOTAL HIP ARTHROPLASTY ANTERIOR APPROACH;  Surgeon: Frederik Pear, MD;  Location: [place];  Service: Orthopedics;  Laterality: Left;  Social History:  Social History   Social History  . Marital status: Married    Spouse name: N/A  . Number of children: N/A  . Years of education: N/A   Occupational History  . Not on file.   Social History Main Topics  . Smoking status: Former Smoker    Packs/day: 0.25    Years: 5.00    Types: Cigarettes    Quit date: 03/20/1966  . Smokeless tobacco: Never Used  . Alcohol use No  . Drug use: No  . Sexual activity: Not on file   Other Topics Concern  . Not on file   Social History Narrative  . No narrative on file    Family History: Family History  Problem Relation Age of Onset  . Stroke Mother   . Coronary artery disease Brother     had bypass    ALLERGIES:  has No Known Allergies.  Meds: Current Outpatient Prescriptions  Medication Sig Dispense Refill  . aspirin EC 325 MG tablet Take 1 tablet (325 mg total) by mouth 2 (two) times daily.  (Patient taking differently: Take 325 mg by mouth daily. ) 30 tablet 0  . atorvastatin (LIPITOR) 40 MG tablet Take 1 tablet (40 mg total) by mouth daily at 6 PM. 90 tablet 3  . B Complex-Biotin-FA (VITAMIN B50 COMPLEX PO) Take 50 mg by mouth daily.    . cholecalciferol (VITAMIN D) 1000 UNITS tablet Take 1,000 Units by mouth daily.    . fish oil-omega-3 fatty acids 1000 MG capsule Take 1 g by mouth daily.     Marland Kitchen Hyaluronic Acid-Vitamin C (HYALURONIC ACID PO) Take 100 mg by mouth daily.    . L-Glutamine 500 MG TABS Take 500 mg by mouth every evening.     . Lecithin 1200 MG CAPS Take 1,200 mg by mouth daily.     . Misc Natural Products (OSTEO BI-FLEX JOINT SHIELD PO) Take 100 mg by mouth every evening. Pt not sure of dosage.    . Multiple Vitamin (MULTIVITAMIN WITH MINERALS) TABS tablet Take 1 tablet by mouth daily.    Marland Kitchen OVER THE COUNTER MEDICATION Take 20 mg by mouth daily. Lutigold    . pyridOXINE (VITAMIN B-6) 50 MG tablet Take 50 mg by mouth daily.    . Sodium Fluoride 1.1 % PSTE Apply thin ribbon of cream to toothbrush. Brush teeth for 2 minutes at bedtime. Spit out excess. Do not swallow. 1 Bottle prn  . tretinoin (RETIN-A) 0.025 % cream Apply 1 application topically daily as needed (apply to face rash). As Directed    . Turmeric 450 MG CAPS Take 450 mg by mouth daily.     Marland Kitchen UBIQUINOL PO Take 100 mg by mouth daily.     Marland Kitchen VITAMIN A PO Take 1 tablet by mouth daily.    . vitamin C (ASCORBIC ACID) 500 MG tablet Take 500 mg by mouth 2 (two) times daily.     . vitamin E 400 UNIT capsule Take 400 Units by mouth every morning. Reported on 11/30/2015    . Zinc 25 MG TABS Take 25 mg by mouth daily.    . calcium carbonate (OS-CAL) 600 MG TABS Take 1,200 mg by mouth every evening.     . niacinamide 500 MG tablet Take 500 mg by mouth every evening.    Marland Kitchen oxyCODONE-acetaminophen (ROXICET) 5-325 MG tablet Take 1 tablet by mouth every 4 (four) hours as needed. (Patient not taking: Reported on 08/01/2016) 60  tablet 0  .  senna-docusate (SENOKOT-S) 8.6-50 MG tablet Take 1 tablet by mouth 2 (two) times daily. (Patient not taking: Reported on 08/01/2016) 30 tablet 0  . tiZANidine (ZANAFLEX) 2 MG tablet Take 1 tablet (2 mg total) by mouth every 6 (six) hours as needed for muscle spasms. (Patient not taking: Reported on 08/01/2016) 60 tablet 0  . traMADol (ULTRAM) 50 MG tablet Take 1 tablet (50 mg total) by mouth every 6 (six) hours as needed. (Patient not taking: Reported on 08/01/2016) 60 tablet 0   No current facility-administered medications for this encounter.    Facility-Administered Medications Ordered in Other Encounters  Medication Dose Route Frequency Provider Last Rate Last Dose  . topical emolient (BIAFINE) emulsion   Topical BID Kyung Rudd, MD        Physical Findings:  height is 5\' 9"  (1.753 m) and weight is 172 lb 3.2 oz (78.1 kg). His temperature is 98.2 F (36.8 C). His blood pressure is 153/97 (abnormal) and his pulse is 78. His respiration is 20.  In general this is a well appearing Caucasian gentleman in no acute distress. He is alert and oriented x4 and appropriate throughout the examination. HEENT reveals that the patient is normocephalic, atraumatic. EOMs are intact. PERRLA. Oral cavity is clear Skin is intact without any evidence of gross lesions. Cardiovascular exam reveals a regular rate and rhythm, no clicks rubs or murmurs are auscultated. Chest is clear to auscultation bilaterally. Lymphatic assessment is performed and does not reveal any adenopathy in the cervical, supraclavicular, chains. Abdomen has active bowel sounds in all quadrants and is intact. The abdomen is soft, non tender, non distended. Lower extremities are negative for pretibial pitting edema, deep calf tenderness, cyanosis or clubbing.  Fiberoptic exam: After the use of topical anesthetic, a fiberoptic scope was passed through the right knee air. Good visualization of the base of tongue and oropharynx was  visualized as well as the laryngeal region. No suspicious findings were seen. No discrete masses, plaques, or other significant abnormalities.    Lab Findings: Lab Results  Component Value Date   WBC 8.5 02/10/2016   HGB 10.3 (L) 02/10/2016   HCT 31.3 (L) 02/10/2016   MCV 90.2 02/10/2016   PLT 100 (L) 02/10/2016     Radiographic Findings: No results found.  Impression/Plan: 1. This is a well appearing 75 year old gentleman with a history of squamous cell carcinoma of the base of the tongue. There is no evidence of recurrence at this time. He is 5 years out from his prior treatment and he will be released at this time from our clinic. The patient understand that he may contact us with questions or concerns at any time.  ------------------------------------------------  Jodelle Gross, MD, PhD   This document serves as a record of services personally performed by Kyung Rudd, MD. It was created on his behalf by Maryla Morrow, a trained medical scribe. The creation of this record is based on the scribe's personal observations and the provider's statements to them. This document has been checked and approved by the attending provider.

## 2016-08-01 NOTE — Progress Notes (Signed)
Follow up s/p base of tongue,  06/18/11-08/03/11, has a squamous cell  Left ear, bx =came back as squamous cell,  Had left  hip surgery  Total may 24/17, b/l hernia repairs January 2016, January 2017,  Last ENT MD Dr. Constance Holster,  doesn't remember his last visit,  Last TSH  And T4 done 04/27/14, tsh=3.131, T4=1.02   Swallowing good, eating most foods, no pain,  2:02 PM  BP (!) 153/97 (BP Location: Right Arm, Patient Position: Sitting, Cuff Size: Normal)   Pulse 78   Temp 98.2 F (36.8 C)   Resp 20   Ht 5\' 9"  (1.753 m)   Wt 172 lb 3.2 oz (78.1 kg)   BMI 25.43 kg/m   Wt Readings from Last 3 Encounters:  08/01/16 172 lb 3.2 oz (78.1 kg)  03/27/16 174 lb (78.9 kg)  02/08/16 172 lb (78 kg)

## 2016-08-02 DIAGNOSIS — Z96642 Presence of left artificial hip joint: Secondary | ICD-10-CM | POA: Diagnosis not present

## 2016-08-02 DIAGNOSIS — M25552 Pain in left hip: Secondary | ICD-10-CM | POA: Diagnosis not present

## 2016-08-24 DIAGNOSIS — C4492 Squamous cell carcinoma of skin, unspecified: Secondary | ICD-10-CM | POA: Diagnosis not present

## 2016-08-24 DIAGNOSIS — Z23 Encounter for immunization: Secondary | ICD-10-CM | POA: Diagnosis not present

## 2016-08-24 DIAGNOSIS — L821 Other seborrheic keratosis: Secondary | ICD-10-CM | POA: Diagnosis not present

## 2016-08-24 DIAGNOSIS — L570 Actinic keratosis: Secondary | ICD-10-CM | POA: Diagnosis not present

## 2016-08-27 DIAGNOSIS — C44229 Squamous cell carcinoma of skin of left ear and external auricular canal: Secondary | ICD-10-CM | POA: Diagnosis not present

## 2016-10-01 DIAGNOSIS — D485 Neoplasm of uncertain behavior of skin: Secondary | ICD-10-CM | POA: Diagnosis not present

## 2016-10-01 DIAGNOSIS — C44329 Squamous cell carcinoma of skin of other parts of face: Secondary | ICD-10-CM | POA: Diagnosis not present

## 2016-10-01 DIAGNOSIS — C44222 Squamous cell carcinoma of skin of right ear and external auricular canal: Secondary | ICD-10-CM | POA: Diagnosis not present

## 2016-10-22 DIAGNOSIS — C44329 Squamous cell carcinoma of skin of other parts of face: Secondary | ICD-10-CM | POA: Diagnosis not present

## 2016-10-22 DIAGNOSIS — C44222 Squamous cell carcinoma of skin of right ear and external auricular canal: Secondary | ICD-10-CM | POA: Diagnosis not present

## 2016-10-30 ENCOUNTER — Other Ambulatory Visit: Payer: Self-pay | Admitting: *Deleted

## 2016-11-07 ENCOUNTER — Telehealth: Payer: Self-pay | Admitting: Cardiology

## 2016-11-07 MED ORDER — ATORVASTATIN CALCIUM 40 MG PO TABS: 40.0000 mg | ORAL_TABLET | Freq: Every day | ORAL | 1 refills | Status: DC

## 2016-11-07 NOTE — Telephone Encounter (Signed)
°*  STAT* If patient is at the pharmacy, call can be transferred to refill team.   1. Which medications need to be refilled? (please list name of each medication and dose if known) Atortastatin 40mg   2. Which pharmacy/location (including street and city if local pharmacy) is medication to be sent to?[facility] 3. Do they need a 30 day or 90 day supply? 90day

## 2016-12-28 ENCOUNTER — Ambulatory Visit (INDEPENDENT_AMBULATORY_CARE_PROVIDER_SITE_OTHER): Payer: Medicare Other | Admitting: Physician Assistant

## 2016-12-28 ENCOUNTER — Encounter: Payer: Self-pay | Admitting: Physician Assistant

## 2016-12-28 VITALS — BP 178/95 | HR 60 | Ht 69.0 in | Wt 176.8 lb

## 2016-12-28 DIAGNOSIS — Z79899 Other long term (current) drug therapy: Secondary | ICD-10-CM | POA: Diagnosis not present

## 2016-12-28 DIAGNOSIS — Z8673 Personal history of transient ischemic attack (TIA), and cerebral infarction without residual deficits: Secondary | ICD-10-CM

## 2016-12-28 DIAGNOSIS — I25810 Atherosclerosis of coronary artery bypass graft(s) without angina pectoris: Secondary | ICD-10-CM

## 2016-12-28 DIAGNOSIS — I712 Thoracic aortic aneurysm, without rupture, unspecified: Secondary | ICD-10-CM

## 2016-12-28 DIAGNOSIS — I77810 Thoracic aortic ectasia: Secondary | ICD-10-CM

## 2016-12-28 DIAGNOSIS — R911 Solitary pulmonary nodule: Secondary | ICD-10-CM

## 2016-12-28 DIAGNOSIS — E785 Hyperlipidemia, unspecified: Secondary | ICD-10-CM

## 2016-12-28 LAB — LIPID PANEL
Cholesterol: 137 mg/dL (ref <200)
HDL: 65 mg/dL (ref >40)
LDL Cholesterol: 65 mg/dL (ref <100)
Total CHOL/HDL Ratio: 2.1 Ratio (ref <5.0)
Triglycerides: 35 mg/dL (ref <150)
VLDL: 7 mg/dL (ref <30)

## 2016-12-28 LAB — HEPATIC FUNCTION PANEL
ALT: 16 U/L (ref 9–46)
AST: 20 U/L (ref 10–35)
Albumin: 3.6 g/dL (ref 3.6–5.1)
Alkaline Phosphatase: 71 U/L (ref 40–115)
Bilirubin, Direct: 0.1 mg/dL (ref <=0.2)
Indirect Bilirubin: 0.6 mg/dL (ref 0.2–1.2)
Total Bilirubin: 0.7 mg/dL (ref 0.2–1.2)
Total Protein: 6.2 g/dL (ref 6.1–8.1)

## 2016-12-28 LAB — BASIC METABOLIC PANEL
BUN: 22 mg/dL (ref 7–25)
CO2: 28 mmol/L (ref 20–31)
Calcium: 9.1 mg/dL (ref 8.6–10.3)
Chloride: 105 mmol/L (ref 98–110)
Creat: 1.29 mg/dL — ABNORMAL HIGH (ref 0.70–1.18)
Glucose, Bld: 90 mg/dL (ref 65–99)
Potassium: 4.7 mmol/L (ref 3.5–5.3)
Sodium: 140 mmol/L (ref 135–146)

## 2016-12-28 NOTE — Progress Notes (Signed)
Cardiology Office Note    Date:  12/29/2016   ID:  Jahziel RITCHEY, DOB 1941-04-25, MRN [id number]  PCP:  Shirline Frees, MD  Cardiologist:  Dr. Stanford Breed  Chief Complaint  Patient presents with  . Follow-up    Office Visit; Pt states no Sx. Seen for Dr. Stanford Breed    History of Present Illness:  BRENDYN MCLAREN is a 76 y.o. male with PMH of CVA, HLD, oropharynx CA, prostate CA, dilated aortic root and CAD s/p CABG in 1991 and SVG BMS in 2008. Echocardiogram obtained in April 2016 showed normal LV function, grade 1 diastolic dysfunction, mild left atrial enlargement, mild tricuspid regurg. Ascending aorta measured 4.5 cm. Chest CT in May 2017 showed 4mm nodular thickening in the left lower lobe, follow-up recommended in 6-12 month.  He was last seen on 03/27/2016, he was started on Lipitor 40 mg daily at the time he was supposed to return for lab work in 4 weeks, this did not happen.  He presents today for follow-up, he has been doing well otherwise, he denies any chest discomfort or shortness breath. He has not exercised much since his left hip surgery last year. He has not seen his primary care physician for over a year. We went over the previous image with him. I also called radiology department, who recommended CT angiogram of the chest with aorta to assess both pulmonary nodule and also dilated aortic root. As far as Lipitor, he says he is taking it 6 out of 7 days a week. He has not experienced myalgia. I recommend him to increase it to 40 mg daily and have a fasting lipid panel and LFT in 4 weeks. He did drink a cup of coffee this morning. I also recommended him to increase exercise. Blood pressure is elevated in the 170 today, on repeat manual check by myself, it was 156/90. He attributed to white coat syndrome, it does not appear he is on any blood pressure medication at this time. I asked him to monitor his blood pressure at least twice a week and bring a blood pressure diary with  him to a 3 month follow-up. He says he does not have a blood pressure cuff at home.    Past Medical History:  Diagnosis Date  . Abscess of gallbladder   . 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 Basal cell carcinoma of skin   . Broken jaw ([place])    right lower   . Cholecystitis   . Coronary heart disease    has a stent  . CVA (cerebral infarction) 2007   "mild"  . Fatigue 07/26/2011  . History of cancer chemotherapy   . History of radiation therapy 06/18/2011- 08/03/2011   squamous cell tongue/69.96 Gy 69fx  . Hypercholesteremia    under control  . Myocardial infarction ([place])   . Numbness    right lower   . Numbness and tingling in left hand    numbess 2 fingers   . 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 Peripheral vascular disease ([place])   . PFO (patent foramen ovale)   . Prostate cancer ([place]) 2002  . Skin cancer    basal cell, squamous cell  . Squamous cell carcinoma of skin   . Stroke [facility])     Past Surgical History:  Procedure Laterality Date  . CHOLECYSTECTOMY  04/2009   3 surgeries involved: lap chole x2 laparotomy  . CORONARY ARTERY BYPASS  GRAFT  1991   triple  . ELBOW SURGERY  42 years ago   removal left radial head due to hockey accident  . EXPLORATORY LAPAROTOMY  04/2010   Dr. Redmond Pulling (3rd surgery for gall bladder)  . INGUINAL HERNIA REPAIR Right 09/23/2014   Procedure: RIGHT INGUINAL HERNIA REPAIR WITH MESH;  Surgeon: Jackolyn Confer, MD;  Location: [facility];  Service: General;  Laterality: Right;  . INGUINAL HERNIA REPAIR Left 09/30/2015   Procedure: HERNIA REPAIR LEFT INGUINAL ADULT;  Surgeon: Jackolyn Confer, MD;  Location: [facility];  Service: General;  Laterality: Left;  . INSERTION OF MESH Left 09/30/2015   Procedure: INSERTION OF MESH;  Surgeon: Jackolyn Confer, MD;  Location: [facility];  Service: General;  Laterality: Left;  Marland Kitchen MULTIPLE TOOTH EXTRACTIONS     prior to radiation  . PEG TUBE PLACEMENT  06/13/11 at [facility] IR   10/2011 removed  .  PROSTATECTOMY  2002  . TONSILLECTOMY     x2: as child then 1985 for Left lingual tonsil removed  . TOTAL HIP ARTHROPLASTY Left 02/08/2016   Procedure: TOTAL HIP ARTHROPLASTY ANTERIOR APPROACH;  Surgeon: Frederik Pear, MD;  Location: [place];  Service: Orthopedics;  Laterality: Left;    Current Medications: Outpatient Medications Prior to Visit  Medication Sig Dispense Refill  . aspirin EC 325 MG tablet Take 1 tablet (325 mg total) by mouth 2 (two) times daily. (Patient taking differently: Take 325 mg by mouth daily. ) 30 tablet 0  . atorvastatin (LIPITOR) 40 MG tablet Take 1 tablet (40 mg total) by mouth daily at 6 PM. 90 tablet 1  . B Complex-Biotin-FA (VITAMIN B50 COMPLEX PO) Take 50 mg by mouth daily.    . calcium carbonate (OS-CAL) 600 MG TABS Take 1,200 mg by mouth every evening.     . cholecalciferol (VITAMIN D) 1000 UNITS tablet Take 1,000 Units by mouth daily.    . fish oil-omega-3 fatty acids 1000 MG capsule Take 1 g by mouth daily.     Marland Kitchen Hyaluronic Acid-Vitamin C (HYALURONIC ACID PO) Take 100 mg by mouth daily.    . L-Glutamine 500 MG TABS Take 500 mg by mouth every evening.     . Lecithin 1200 MG CAPS Take 1,200 mg by mouth daily.     . Misc Natural Products (OSTEO BI-FLEX JOINT SHIELD PO) Take 100 mg by mouth every evening. Pt not sure of dosage.    . Multiple Vitamin (MULTIVITAMIN WITH MINERALS) TABS tablet Take 1 tablet by mouth daily.    . niacinamide 500 MG tablet Take 500 mg by mouth every evening.    Marland Kitchen OVER THE COUNTER MEDICATION Take 20 mg by mouth daily. Lutigold    . oxyCODONE-acetaminophen (ROXICET) 5-325 MG tablet Take 1 tablet by mouth every 4 (four) hours as needed. 60 tablet 0  . pyridOXINE (VITAMIN B-6) 50 MG tablet Take 50 mg by mouth daily.    Marland Kitchen senna-docusate (SENOKOT-S) 8.6-50 MG tablet Take 1 tablet by mouth 2 (two) times daily. 30 tablet 0  . Sodium Fluoride 1.1 % PSTE Apply thin ribbon of cream to toothbrush. Brush teeth for 2 minutes at bedtime. Spit out  excess. Do not swallow. 1 Bottle prn  . tretinoin (RETIN-A) 0.025 % cream Apply 1 application topically daily as needed (apply to face rash). As Directed    . Turmeric 450 MG CAPS Take 450 mg by mouth daily.     Marland Kitchen UBIQUINOL PO Take 100 mg by mouth daily.     Marland Kitchen  VITAMIN A PO Take 1 tablet by mouth daily.    . vitamin C (ASCORBIC ACID) 500 MG tablet Take 500 mg by mouth 2 (two) times daily.     . vitamin E 400 UNIT capsule Take 400 Units by mouth every morning. Reported on 11/30/2015    . Zinc 25 MG TABS Take 25 mg by mouth daily.    Marland Kitchen tiZANidine (ZANAFLEX) 2 MG tablet Take 1 tablet (2 mg total) by mouth every 6 (six) hours as needed for muscle spasms. 60 tablet 0  . traMADol (ULTRAM) 50 MG tablet Take 1 tablet (50 mg total) by mouth every 6 (six) hours as needed. 60 tablet 0   Facility-Administered Medications Prior to Visit  Medication Dose Route Frequency Provider Last Rate Last Dose  . topical emolient (BIAFINE) emulsion   Topical BID Kyung Rudd, MD         Allergies:   Patient has no known allergies.   Social History   Social History  . Marital status: Married    Spouse name: N/A  . Number of children: N/A  . Years of education: N/A   Social History Main Topics  . Smoking status: Former Smoker    Packs/day: 0.25    Years: 5.00    Types: Cigarettes    Quit date: 03/20/1966  . Smokeless tobacco: Never Used  . Alcohol use No  . Drug use: No  . Sexual activity: Not Asked   Other Topics Concern  . None   Social History Narrative  . None     Family History:  The patient's family history includes Coronary artery disease in his brother; Stroke in his mother.   ROS:   Please see the history of present illness.    ROS All other systems reviewed and are negative.   PHYSICAL EXAM:   VS:  BP (!) 178/95   Pulse 60   Ht 5\' 9"  (1.753 m)   Wt 176 lb 12.8 oz (80.2 kg)   BMI 26.11 kg/m    GEN: Well nourished, well developed, in no acute distress  HEENT: normal  Neck: no JVD,  carotid bruits, or masses Cardiac: RRR; no murmurs, rubs, or gallops,no edema  Respiratory:  clear to auscultation bilaterally, normal work of breathing GI: soft, nontender, nondistended, + BS MS: no deformity or atrophy  Skin: warm and dry, no rash Neuro:  Alert and Oriented x 3, Strength and sensation are intact Psych: euthymic mood, full affect  Wt Readings from Last 3 Encounters:  12/28/16 176 lb 12.8 oz (80.2 kg)  08/01/16 172 lb 3.2 oz (78.1 kg)  03/27/16 174 lb (78.9 kg)      Studies/Labs Reviewed:   EKG:  EKG is ordered today.  The ekg ordered today demonstrates Normal sinus rhythm without significant ST-T wave changes  Recent Labs: 02/10/2016: Hemoglobin 10.3; Platelets 100 12/28/2016: ALT 16; BUN 22; Creat 1.29; Potassium 4.7; Sodium 140   Lipid Panel    Component Value Date/Time   CHOL 137 12/28/2016 0923   TRIG 35 12/28/2016 0923   HDL 65 12/28/2016 0923   CHOLHDL 2.1 12/28/2016 0923   VLDL 7 12/28/2016 0923   LDLCALC 65 12/28/2016 0923   LDLDIRECT 168.1 12/15/2012 1539    Additional studies/ records that were reviewed today include:   Echo 01/13/2015 LV EF: 55% -   60%  - Left ventricle: The cavity size was normal. Wall thickness was   increased in a pattern of mild LVH. There was severe focal basal  hypertrophy of the septum. Systolic function was normal. The   estimated ejection fraction was in the range of 55% to 60%. Wall   motion was normal; there were no regional wall motion   abnormalities. Doppler parameters are consistent with abnormal   left ventricular relaxation (grade 1 diastolic dysfunction). The   E/e&' ratio is <8, suggesting normal LV filling pressure. - Aortic valve: Trileaflet. Sclerosis without stenosis. There was   no regurgitation. - Aorta: Aortic root dimension: 45 mm (ED). - Ascending aorta: The ascending aorta was dilated. - Mitral valve: Calcified annulus. Mildly thickened leaflets .   There was trivial regurgitation. - Left  atrium: The atrium was at the upper limits of normal in   size. - Right atrium: The atrium was mildly dilated. - Atrial septum: Aneurysmal - PFO cannot be excluded. - Tricuspid valve: There was mild regurgitation. - Pulmonary arteries: PA peak pressure: 44 mm Hg (S). - Inferior vena cava: The vessel was normal in size. The   respirophasic diameter changes were in the normal range (= 50%),   consistent with normal central venous pressure.  Impressions:  - Compared to the prior study in 2014, EF is improved to 55-60%,   there is now severe focal basal septal hypertrophy. Aortic valve   sclerosis. The ascending aorta measures up to 4.5 cm at the most   distally visualized point. There is mild TR with mild pulmonary   hypertension, RVSP 44 mmHg.    CT of chest with contrast 02/09/2016 IMPRESSION: 8 mm subpleural thickening in the left lower lobe, likely represent for the ability seen radiographically. Non-contrast chest CT at 6-12 months is recommended. If the nodule is stable at time of repeat CT, then future CT at 18-24 months (from today's scan) is considered optional for low-risk patients, but is recommended for high-risk patients. This recommendation follows the consensus statement: Guidelines for Management of Incidental Pulmonary Nodules Detected on CT Images:From the Fleischner Society 2017; published online before print (10.1148/radiol.8756433295).  No evidence of mediastinal lymphadenopathy.  Atherosclerotic disease of the aorta and coronary arteries.  Unfolded aorta.   ASSESSMENT:    1. Thoracic aortic aneurysm without rupture ([place])   2. Dilated aortic root ([place])   3. Medication management   4. Hyperlipidemia, unspecified hyperlipidemia type   5. H/O: CVA (cerebrovascular accident)   6. Coronary artery disease involving coronary bypass graft of native heart without angina pectoris   7. Pulmonary nodule      PLAN:  In order of problems listed  above:  1. Dilated aortic root: Based on previous echocardiogram, even in 2008, he had moderately dilated aortic root. Aortic root was measuring 4.5 cm even on echocardiogram back in April 2014. Repeat echocardiogram in April 2016 did not show significant change. He also noted to have pulmonary nodule on CT of the chest w/ contrast in 2017, he was supposed to have a 6-12 month follow-up image. I called radiology service to see if a CT angiogram of the chest will have a good image of both aortic root and pulmonary nodule, CT tech recommended CT angiogram of chest with aorta protocol. We will obtain BMET today.   - Note, his blood pressure is elevated today, we discussed correlation with dilated aortic root and uncontrolled blood pressure. He will return in 3 month for recheck blood pressure. During the meantime he will keep a blood pressure diary.  2. Pulmonary nodule: Seen on CT of chest with contrast in 2017. Plan for repeat imaging in 6-12  month. He is due for a repeat image.  3. CAD s/p CABG  in 1991 and SVG BMS in 2008: No obvious angina. He has not exercised much since his hip replacement surgery last year, I recommended him to increase activity level.  4. Hyperlipidemia: He is due for a fasting lipid panel and LFTs. He did drink coffee this morning, I recommended him return in 4 weeks to obtain fasting lipid panel and LFT   Addendum: It appears he drew both LFT and fasting lipid panel along with basic metabolic panel today instead of wait 4 weeks. The result looked surprisingly good.    Medication Adjustments/Labs and Tests Ordered: Current medicines are reviewed at length with the patient today.  Concerns regarding medicines are outlined above.  Medication changes, Labs and Tests ordered today are listed in the Patient Instructions below. Patient Instructions  Medication Instructions:  INCREASE LIPITOR 40MG  DAILY  If you need a refill on your cardiac medications before your next  appointment, please call your pharmacy.  Labwork: BMP TODAY, LFT AND FLP IN 4 WEEKS AT [facility] ON THE 1ST FLOOR  Testing/Procedures: Your physician has requested that you have cardiac CT. Cardiac computed tomography (CT) is a painless test that uses an x-ray machine to take clear, detailed pictures of your heart. For further information please visit [facility]uge[facility]iesta[facility]tn. Please follow instruction sheet as given.  Follow-Up: Your physician wants you to follow-up in: 3MONTHS WITH DR CRENSHAW   Special Instructions: OK TO INCREASE EXERCISE  MONITOR BP TWICE A WEEK AND LOG-BRING TO NEXT VISIT   Thank you for choosing [facility] at [facility]!!    Renuka Farfan [facility] [place], LPN     Hilbert Corrigan, [place]  12/29/2016 8:28 AM    [facility] [address], [place], [place]  [address] Phone: [phone]; Fax: [phone]

## 2016-12-28 NOTE — Patient Instructions (Signed)
Medication Instructions:  INCREASE LIPITOR 40MG  DAILY  If you need a refill on your cardiac medications before your next appointment, please call your pharmacy.  Labwork: BMP TODAY, LFT AND FLP IN 4 WEEKS AT [facility] ON THE 1ST FLOOR  Testing/Procedures: Your physician has requested that you have cardiac CT. Cardiac computed tomography (CT) is a painless test that uses an x-ray machine to take clear, detailed pictures of your heart. For further information please visit [facility]iest[employer]tn. Please follow instruction sheet as given.  Follow-Up: Your physician wants you to follow-up in: 3MONTHS WITH DR CRENSHAW   Special Instructions: OK TO INCREASE EXERCISE  MONITOR BP TWICE A WEEK AND LOG-BRING TO NEXT VISIT   Thank you for choosing [facility] at [facility]!!    [facility] [place], LPN

## 2016-12-29 ENCOUNTER — Encounter: Payer: Self-pay | Admitting: Physician Assistant

## 2016-12-31 ENCOUNTER — Telehealth: Payer: Self-pay | Admitting: *Deleted

## 2016-12-31 DIAGNOSIS — I77810 Thoracic aortic ectasia: Secondary | ICD-10-CM

## 2016-12-31 DIAGNOSIS — I252 Old myocardial infarction: Secondary | ICD-10-CM

## 2016-12-31 NOTE — Telephone Encounter (Signed)
PATIENT'S WIFE CALLED and spoke to  CT SCAN DEPT AT [facility]. CANCELLED UPCOMING  CT SCAN FOR 01/07/17 .   WIFE STATES SHE WAS CONCERNED ABOUT PATIENT'S KIDNEYS AND STATES PATIENT HAS SEVERAL CT TEST DONE RECENTLY.   WOULD FOR SOME ONE TO CAL AND TALK WITH HER AND THE PATIENT PER STACY IN CT DEPT.  DEFER TO MENG PA.

## 2016-12-31 NOTE — Telephone Encounter (Signed)
Spoke with the patient's wife, the reason for the CT scan of aorta is to check the size of left lower lobe nodule and dilated aortic root at the same time, however patient's wife mentions he has had more than a dozen CT over the years and was worried about contrast and radiology. She is ok with [place] to obtain repeat echo to assess the size of aortic root and let primary care physician to decide on the workup for the pulmonary nodule. I think it is reasonable. We will cancel the CT of aorta and order echocardiogram to assess the aortic root and defer workup for pulmonary nodule for primary care physician.

## 2016-12-31 NOTE — Telephone Encounter (Signed)
Acknowledged.

## 2017-01-02 DIAGNOSIS — H25043 Posterior subcapsular polar age-related cataract, bilateral: Secondary | ICD-10-CM | POA: Diagnosis not present

## 2017-01-02 DIAGNOSIS — H2513 Age-related nuclear cataract, bilateral: Secondary | ICD-10-CM | POA: Diagnosis not present

## 2017-01-02 DIAGNOSIS — H25013 Cortical age-related cataract, bilateral: Secondary | ICD-10-CM | POA: Diagnosis not present

## 2017-01-02 DIAGNOSIS — H2512 Age-related nuclear cataract, left eye: Secondary | ICD-10-CM | POA: Diagnosis not present

## 2017-01-07 ENCOUNTER — Other Ambulatory Visit: Payer: Self-pay

## 2017-01-10 ENCOUNTER — Telehealth (HOSPITAL_COMMUNITY): Payer: Self-pay | Admitting: Cardiology

## 2017-01-10 NOTE — Telephone Encounter (Signed)
I called patient to schedule him for an Echocardiogram that [facility] ordered for him and he inquired about some results to some labs that he has recently had. Please f/u with patient.   Thanks

## 2017-01-10 NOTE — Telephone Encounter (Signed)
Goes to VM - left msg to call. 

## 2017-01-10 NOTE — Telephone Encounter (Signed)
Echocardiogram ordered, left msg for patient to inform. Staff msg sent to scheduling to arrange test.

## 2017-01-15 NOTE — Telephone Encounter (Signed)
Results given to patient

## 2017-01-17 DIAGNOSIS — H259 Unspecified age-related cataract: Secondary | ICD-10-CM | POA: Diagnosis not present

## 2017-01-17 DIAGNOSIS — N183 Chronic kidney disease, stage 3 (moderate): Secondary | ICD-10-CM | POA: Diagnosis not present

## 2017-01-17 DIAGNOSIS — R911 Solitary pulmonary nodule: Secondary | ICD-10-CM | POA: Diagnosis not present

## 2017-01-17 DIAGNOSIS — H903 Sensorineural hearing loss, bilateral: Secondary | ICD-10-CM | POA: Diagnosis not present

## 2017-01-18 ENCOUNTER — Other Ambulatory Visit: Payer: Self-pay | Admitting: Family Medicine

## 2017-01-18 DIAGNOSIS — R911 Solitary pulmonary nodule: Secondary | ICD-10-CM

## 2017-01-21 ENCOUNTER — Ambulatory Visit
Admission: RE | Admit: 2017-01-21 | Discharge: 2017-01-21 | Disposition: A | Discharge status: Home / Self Care | Payer: Medicare Other | Source: Ambulatory Visit | Attending: Family Medicine | Admitting: Family Medicine

## 2017-01-21 DIAGNOSIS — R911 Solitary pulmonary nodule: Secondary | ICD-10-CM | POA: Diagnosis not present

## 2017-01-22 ENCOUNTER — Telehealth: Payer: Self-pay | Admitting: Cardiology

## 2017-01-22 NOTE — Telephone Encounter (Signed)
New Message  Pt would like to know what is the difference between the CT scan he received on 5/7 and the echo he will complete on 5/11. Pt would like to know if he will need to complete the echo. Please call back to discuss

## 2017-01-22 NOTE — Telephone Encounter (Signed)
LMTCB  See telephone note 12/31/16

## 2017-01-23 ENCOUNTER — Telehealth: Payer: Self-pay | Admitting: Cardiology

## 2017-01-23 NOTE — Telephone Encounter (Signed)
Ok to cancel echo [employer]

## 2017-01-23 NOTE — Telephone Encounter (Signed)
Spoke with pt, Aware of dr Jacalyn Lefevre recommendations. Echo canceled.

## 2017-01-23 NOTE — Telephone Encounter (Signed)
Per previous encounter, the ECHO has been canceled and the patient has been made aware.

## 2017-01-23 NOTE — Telephone Encounter (Signed)
New Message     Pt wants to know if the Echo is necessary since he just had a CT done ? He doesn't want to waste time and money having test done that doesn't need to be done

## 2017-01-23 NOTE — Telephone Encounter (Signed)
Spoke to the patient. He had a CT done on 5/7 ordered by his primary physician. He would like to cancel his ECHO if possible due to finances and if it is not necessary. Will route to the physician for further recommendation.

## 2017-01-24 DIAGNOSIS — M25552 Pain in left hip: Secondary | ICD-10-CM | POA: Diagnosis not present

## 2017-01-25 ENCOUNTER — Other Ambulatory Visit (HOSPITAL_COMMUNITY): Payer: Self-pay

## 2017-01-28 DIAGNOSIS — H903 Sensorineural hearing loss, bilateral: Secondary | ICD-10-CM | POA: Diagnosis not present

## 2017-03-14 DIAGNOSIS — D0439 Carcinoma in situ of skin of other parts of face: Secondary | ICD-10-CM | POA: Diagnosis not present

## 2017-03-14 DIAGNOSIS — L570 Actinic keratosis: Secondary | ICD-10-CM | POA: Diagnosis not present

## 2017-03-14 DIAGNOSIS — D485 Neoplasm of uncertain behavior of skin: Secondary | ICD-10-CM | POA: Diagnosis not present

## 2017-03-14 DIAGNOSIS — L821 Other seborrheic keratosis: Secondary | ICD-10-CM | POA: Diagnosis not present

## 2017-03-18 NOTE — Progress Notes (Signed)
HPI: FU CAD; s/p CABG in 1991 and SVG BMS in 2008. Echocardiogram April 2016 showed normal LV systolic function, grade 1 diastolic dysfunction, mild left atrial enlargement and mild tricuspid regurgitation. The ascending aorta measured 4.5 cm. Chest CT May 2017 showed subpleural thickening in the left lower lobe and follow-up recommended 6-12 months. Tortuous aorta. Since last seen, the patient denies any dyspnea on exertion, orthopnea, PND, pedal edema, palpitations, syncope or chest pain.   Current Outpatient Prescriptions  Medication Sig Dispense Refill  . aspirin EC 325 MG tablet Take 1 tablet (325 mg total) by mouth 2 (two) times daily. (Patient taking differently: Take 325 mg by mouth daily. ) 30 tablet 0  . atorvastatin (LIPITOR) 40 MG tablet Take 1 tablet (40 mg total) by mouth daily at 6 PM. 90 tablet 1  . B Complex-Biotin-FA (VITAMIN B50 COMPLEX PO) Take 50 mg by mouth daily.    . calcium carbonate (OS-CAL) 600 MG TABS Take 1,200 mg by mouth every evening.     . cholecalciferol (VITAMIN D) 1000 UNITS tablet Take 1,000 Units by mouth daily.    . fish oil-omega-3 fatty acids 1000 MG capsule Take 1 g by mouth daily.     Marland Kitchen Hyaluronic Acid-Vitamin C (HYALURONIC ACID PO) Take 100 mg by mouth daily.    . L-Glutamine 500 MG TABS Take 500 mg by mouth every evening.     . Lecithin 1200 MG CAPS Take 1,200 mg by mouth daily.     . Misc Natural Products (OSTEO BI-FLEX JOINT SHIELD PO) Take 100 mg by mouth every evening. Pt not sure of dosage.    . Multiple Vitamin (MULTIVITAMIN WITH MINERALS) TABS tablet Take 1 tablet by mouth daily.    . niacinamide 500 MG tablet Take 500 mg by mouth every evening.    Marland Kitchen OVER THE COUNTER MEDICATION Take 20 mg by mouth daily. Lutigold    . pyridOXINE (VITAMIN B-6) 50 MG tablet Take 50 mg by mouth daily.    . Sodium Fluoride 1.1 % PSTE Apply thin ribbon of cream to toothbrush. Brush teeth for 2 minutes at bedtime. Spit out excess. Do not swallow. 1 Bottle prn   . tretinoin (RETIN-A) 0.025 % cream Apply 1 application topically daily as needed (apply to face rash). As Directed    . Turmeric 450 MG CAPS Take 450 mg by mouth daily.     Marland Kitchen UBIQUINOL PO Take 100 mg by mouth daily.     Marland Kitchen VITAMIN A PO Take 1 tablet by mouth daily.    . vitamin C (ASCORBIC ACID) 500 MG tablet Take 500 mg by mouth 2 (two) times daily.     . vitamin E 400 UNIT capsule Take 400 Units by mouth every morning. Reported on 11/30/2015    . Zinc 25 MG TABS Take 25 mg by mouth daily.     No current facility-administered medications for this visit.    Facility-Administered Medications Ordered in Other Visits  Medication Dose Route Frequency Provider Last Rate Last Dose  . topical emolient (BIAFINE) emulsion   Topical BID Kyung Rudd, MD         Past Medical History:  Diagnosis Date  . Abscess of gallbladder   . 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 Basal cell carcinoma of skin   . Broken jaw ([place])    right lower   . Cholecystitis   . Coronary heart disease    has  a stent  . CVA (cerebral infarction) 2007   "mild"  . Fatigue 07/26/2011  . History of cancer chemotherapy   . History of radiation therapy 06/18/2011- 08/03/2011   squamous cell tongue/69.96 Gy 43fx  . Hypercholesteremia    under control  . Myocardial infarction ([place])   . Numbness    right lower   . Numbness and tingling in left hand    numbess 2 fingers   . 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 Peripheral vascular disease ([place])   . PFO (patent foramen ovale)   . Prostate cancer ([place]) 2002  . Skin cancer    basal cell, squamous cell  . Squamous cell carcinoma of skin   . Stroke [facility])     Past Surgical History:  Procedure Laterality Date  . CHOLECYSTECTOMY  04/2009   3 surgeries involved: lap chole x2 laparotomy  . CORONARY ARTERY BYPASS GRAFT  1991   triple  . ELBOW SURGERY  42 years ago   removal left radial head due to hockey accident  . EXPLORATORY LAPAROTOMY  04/2010   Dr.  Redmond Pulling (3rd surgery for gall bladder)  . INGUINAL HERNIA REPAIR Right 09/23/2014   Procedure: RIGHT INGUINAL HERNIA REPAIR WITH MESH;  Surgeon: Jackolyn Confer, MD;  Location: [facility];  Service: General;  Laterality: Right;  . INGUINAL HERNIA REPAIR Left 09/30/2015   Procedure: HERNIA REPAIR LEFT INGUINAL ADULT;  Surgeon: Jackolyn Confer, MD;  [facility];  Service: General;  Laterality: Left;  . INSERTION OF MESH Left 09/30/2015   Procedure: INSERTION OF MESH;  Surgeon: Jackolyn Confer, MD;  [facility];  Service: General;  Laterality: Left;  Marland Kitchen MULTIPLE TOOTH EXTRACTIONS     prior to radiation  . PEG TUBE PLACEMENT  06/13/11 at [facility] IR   10/2011 removed  . PROSTATECTOMY  2002  . TONSILLECTOMY     x2: as child then 1985 for Left lingual tonsil removed  . TOTAL HIP ARTHROPLASTY Left 02/08/2016   Procedure: TOTAL HIP ARTHROPLASTY ANTERIOR APPROACH;  Surgeon: Frederik Pear, MD;  Location: [place];  Service: Orthopedics;  Laterality: Left;    Social History   Social History  . Marital status: Married    Spouse name: N/A  . Number of children: N/A  . Years of education: N/A   Occupational History  . Not on file.   Social History Main Topics  . Smoking status: Former Smoker    Packs/day: 0.25    Years: 5.00    Types: Cigarettes    Quit date: 03/20/1966  . Smokeless tobacco: Never Used  . Alcohol use No  . Drug use: No  . Sexual activity: Not on file   Other Topics Concern  . Not on file   Social History Narrative  . No narrative on file    Family History  Problem Relation Age of Onset  . Stroke Mother   . Coronary artery disease Brother        had bypass    ROS: no fevers or chills, productive cough, hemoptysis, dysphasia, odynophagia, melena, hematochezia, dysuria, hematuria, rash, seizure activity, orthopnea, PND, pedal edema, claudication. Remaining systems are negative.  Physical Exam: Well-developed well-nourished in no acute distress.  Skin is warm and dry.  HEENT  is normal.  Neck is supple. No bruits  Chest is clear to auscultation with normal expansion.  Cardiovascular exam is regular rate and rhythm.  Abdominal exam nontender or distended. No masses palpated. Extremities show no edema. neuro  grossly intact   A/P  1 coronary artery disease-status post coronary artery bypass graft. Continue aspirin and statin.  2 subpleural thickening left lower lobe-Now followed by primary care.  3 hyperlipidemia-continue statin. Laboratories from April 2018 personally reviewed and showed LDL 65 and normal liver functions.   4 hypertension-blood pressure elevated. However typically controlled at home. Continue to follow at home. Add medications as needed.  5 patent foramen ovale with prior CVA-no recurrence. Continue aspirin.  6 history of thoracic aortic aneurysm-most recent CT showed tortuous aorta. We will arrange an echocardiogram to assess thoracic aortic root.  Kirk Ruths, MD

## 2017-04-01 ENCOUNTER — Ambulatory Visit (INDEPENDENT_AMBULATORY_CARE_PROVIDER_SITE_OTHER): Payer: Medicare Other | Admitting: Cardiology

## 2017-04-01 ENCOUNTER — Encounter: Payer: Self-pay | Admitting: Cardiology

## 2017-04-01 VITALS — BP 176/98 | HR 68 | Ht 66.0 in | Wt 173.0 lb

## 2017-04-01 DIAGNOSIS — I77810 Thoracic aortic ectasia: Secondary | ICD-10-CM | POA: Diagnosis not present

## 2017-04-01 DIAGNOSIS — I2510 Atherosclerotic heart disease of native coronary artery without angina pectoris: Secondary | ICD-10-CM

## 2017-04-01 DIAGNOSIS — I10 Essential (primary) hypertension: Secondary | ICD-10-CM

## 2017-04-01 DIAGNOSIS — I25810 Atherosclerosis of coronary artery bypass graft(s) without angina pectoris: Secondary | ICD-10-CM | POA: Diagnosis not present

## 2017-04-01 NOTE — Patient Instructions (Signed)

## 2017-04-08 ENCOUNTER — Other Ambulatory Visit: Payer: Self-pay | Admitting: *Deleted

## 2017-04-08 DIAGNOSIS — H02834 Dermatochalasis of left upper eyelid: Secondary | ICD-10-CM | POA: Diagnosis not present

## 2017-04-08 DIAGNOSIS — D485 Neoplasm of uncertain behavior of skin: Secondary | ICD-10-CM | POA: Diagnosis not present

## 2017-04-08 DIAGNOSIS — H02831 Dermatochalasis of right upper eyelid: Secondary | ICD-10-CM | POA: Diagnosis not present

## 2017-04-08 DIAGNOSIS — H11431 Conjunctival hyperemia, right eye: Secondary | ICD-10-CM | POA: Diagnosis not present

## 2017-04-08 MED ORDER — ATORVASTATIN CALCIUM 40 MG PO TABS: 40.0000 mg | ORAL_TABLET | Freq: Every day | ORAL | 3 refills | Status: DC

## 2017-04-09 DIAGNOSIS — L820 Inflamed seborrheic keratosis: Secondary | ICD-10-CM | POA: Diagnosis not present

## 2017-04-11 ENCOUNTER — Ambulatory Visit (HOSPITAL_COMMUNITY): Payer: Medicare Other | Attending: Cardiology

## 2017-04-11 ENCOUNTER — Other Ambulatory Visit: Payer: Self-pay

## 2017-04-11 ENCOUNTER — Other Ambulatory Visit (HOSPITAL_COMMUNITY): Payer: Self-pay

## 2017-04-11 DIAGNOSIS — I77810 Thoracic aortic ectasia: Secondary | ICD-10-CM

## 2017-04-11 DIAGNOSIS — I420 Dilated cardiomyopathy: Secondary | ICD-10-CM | POA: Insufficient documentation

## 2017-04-11 DIAGNOSIS — I253 Aneurysm of heart: Secondary | ICD-10-CM | POA: Insufficient documentation

## 2017-04-11 DIAGNOSIS — I088 Other rheumatic multiple valve diseases: Secondary | ICD-10-CM | POA: Diagnosis not present

## 2017-04-11 DIAGNOSIS — I5030 Unspecified diastolic (congestive) heart failure: Secondary | ICD-10-CM | POA: Diagnosis not present

## 2017-04-18 DIAGNOSIS — D0439 Carcinoma in situ of skin of other parts of face: Secondary | ICD-10-CM | POA: Diagnosis not present

## 2017-05-14 DIAGNOSIS — H2512 Age-related nuclear cataract, left eye: Secondary | ICD-10-CM | POA: Diagnosis not present

## 2017-05-14 DIAGNOSIS — H25012 Cortical age-related cataract, left eye: Secondary | ICD-10-CM | POA: Diagnosis not present

## 2017-05-21 DIAGNOSIS — H2511 Age-related nuclear cataract, right eye: Secondary | ICD-10-CM | POA: Diagnosis not present

## 2017-07-02 DIAGNOSIS — Z23 Encounter for immunization: Secondary | ICD-10-CM | POA: Diagnosis not present

## 2017-07-11 DIAGNOSIS — M9903 Segmental and somatic dysfunction of lumbar region: Secondary | ICD-10-CM | POA: Diagnosis not present

## 2017-07-11 DIAGNOSIS — M5136 Other intervertebral disc degeneration, lumbar region: Secondary | ICD-10-CM | POA: Diagnosis not present

## 2017-07-24 DIAGNOSIS — L821 Other seborrheic keratosis: Secondary | ICD-10-CM | POA: Diagnosis not present

## 2017-07-24 DIAGNOSIS — C4442 Squamous cell carcinoma of skin of scalp and neck: Secondary | ICD-10-CM | POA: Diagnosis not present

## 2017-07-24 DIAGNOSIS — C44329 Squamous cell carcinoma of skin of other parts of face: Secondary | ICD-10-CM | POA: Diagnosis not present

## 2017-07-24 DIAGNOSIS — D485 Neoplasm of uncertain behavior of skin: Secondary | ICD-10-CM | POA: Diagnosis not present

## 2017-07-24 DIAGNOSIS — L570 Actinic keratosis: Secondary | ICD-10-CM | POA: Diagnosis not present

## 2017-07-24 DIAGNOSIS — Z411 Encounter for cosmetic surgery: Secondary | ICD-10-CM | POA: Diagnosis not present

## 2017-08-01 DIAGNOSIS — M9903 Segmental and somatic dysfunction of lumbar region: Secondary | ICD-10-CM | POA: Diagnosis not present

## 2017-08-01 DIAGNOSIS — M5136 Other intervertebral disc degeneration, lumbar region: Secondary | ICD-10-CM | POA: Diagnosis not present

## 2017-08-15 DIAGNOSIS — C44329 Squamous cell carcinoma of skin of other parts of face: Secondary | ICD-10-CM | POA: Diagnosis not present

## 2017-08-29 DIAGNOSIS — M5136 Other intervertebral disc degeneration, lumbar region: Secondary | ICD-10-CM | POA: Diagnosis not present

## 2017-08-29 DIAGNOSIS — M9903 Segmental and somatic dysfunction of lumbar region: Secondary | ICD-10-CM | POA: Diagnosis not present

## 2017-09-19 DIAGNOSIS — M9903 Segmental and somatic dysfunction of lumbar region: Secondary | ICD-10-CM | POA: Diagnosis not present

## 2017-09-19 DIAGNOSIS — M5136 Other intervertebral disc degeneration, lumbar region: Secondary | ICD-10-CM | POA: Diagnosis not present

## 2017-10-01 DIAGNOSIS — L821 Other seborrheic keratosis: Secondary | ICD-10-CM | POA: Diagnosis not present

## 2017-10-01 DIAGNOSIS — Z23 Encounter for immunization: Secondary | ICD-10-CM | POA: Diagnosis not present

## 2017-10-01 DIAGNOSIS — C4359 Malignant melanoma of other part of trunk: Secondary | ICD-10-CM | POA: Diagnosis not present

## 2017-10-01 DIAGNOSIS — Z85828 Personal history of other malignant neoplasm of skin: Secondary | ICD-10-CM | POA: Diagnosis not present

## 2017-10-01 DIAGNOSIS — D485 Neoplasm of uncertain behavior of skin: Secondary | ICD-10-CM | POA: Diagnosis not present

## 2017-10-01 DIAGNOSIS — L570 Actinic keratosis: Secondary | ICD-10-CM | POA: Diagnosis not present

## 2017-10-10 DIAGNOSIS — M5136 Other intervertebral disc degeneration, lumbar region: Secondary | ICD-10-CM | POA: Diagnosis not present

## 2017-10-10 DIAGNOSIS — M9903 Segmental and somatic dysfunction of lumbar region: Secondary | ICD-10-CM | POA: Diagnosis not present

## 2017-10-18 DIAGNOSIS — D487 Neoplasm of uncertain behavior of other specified sites: Secondary | ICD-10-CM | POA: Diagnosis not present

## 2017-10-18 DIAGNOSIS — C4359 Malignant melanoma of other part of trunk: Secondary | ICD-10-CM | POA: Diagnosis not present

## 2017-11-07 DIAGNOSIS — M9903 Segmental and somatic dysfunction of lumbar region: Secondary | ICD-10-CM | POA: Diagnosis not present

## 2017-11-07 DIAGNOSIS — M5136 Other intervertebral disc degeneration, lumbar region: Secondary | ICD-10-CM | POA: Diagnosis not present

## 2018-02-17 DIAGNOSIS — M7061 Trochanteric bursitis, right hip: Secondary | ICD-10-CM | POA: Diagnosis not present

## 2018-03-29 NOTE — Progress Notes (Signed)
Cardiology Office Note   Date:  03/31/2018   ID:  Jason Noble, DOB 1941-03-30, MRN [id number]  PCP:  Shirline Frees, MD  Cardiologist:  Dr. Claiborne Billings  Chief Complaint  Patient presents with  . Follow-up    pt denies chest pains, SOB, and swelling hands/feet     History of Present Illness: Jason Noble is a 77 y.o. male who presents for annual follow-up, ongoing assessment and management of coronary artery disease, history of CABG in 1991, bare-metal stent to the SVG in 2008, echocardiogram completed in April 2016 revealed normal LV systolic function with grade 1 diastolic dysfunction, mild left atrial enlargement and mild tricuspid regurg.  Ascending aorta was noted to be measured at 4.5 cm.  The patient was last seen in the office on 04/01/2017 by Dr. Stanford Breed.  At that time, the patient was asymptomatic and continued on current medication regimen.  Repeat echocardiogram was ordered to evaluate aortic root in the setting of prior aortic aneurysm.  Echocardiogram 04/11/2017.  Left ventricle: The cavity size was normal. There was severe   focal basal hypertrophy. Systolic function was normal. The   estimated ejection fraction was in the range of 55% to 60%. Wall   motion was normal; there were no regional wall motion   abnormalities. There was an increased relative contribution of   atrial contraction to ventricular filling. Doppler parameters are   consistent with abnormal left ventricular relaxation (grade 1   diastolic dysfunction). - Aortic valve: Trileaflet; mildly thickened, moderately calcified   leaflets. - Aorta: Aortic root dimension: 48 mm (ED). Ascending aortic   diameter: 43 mm (S). - Aortic root: The aortic root was moderately dilated. - Ascending aorta: The ascending aorta was mildly dilated. - Mitral valve: Calcified annulus. - Right ventricle: The cavity size was moderately dilated. Wall   thickness was normal. 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 - Pulmonic valve: There was trivial regurgitation. - Pulmonary arteries: Systolic pressure could not be accurately   estimated.  He is without cardiac complaints today. He has chronic right trochanter bursitis pain and is being followed by orthopedics with recent steroid injection. He has had some mild dizziness but no significant episodes. He remains active but not longer runs as he did in the past due to hip pain.   Past Medical History:  Diagnosis Date  . Abscess of gallbladder   . 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 Basal cell carcinoma of skin   . Broken jaw ([place])    right lower   . Cholecystitis   . Coronary heart disease    has a stent  . CVA (cerebral infarction) 2007   "mild"  . Fatigue 07/26/2011  . History of cancer chemotherapy   . History of radiation therapy 06/18/2011- 08/03/2011   squamous cell tongue/69.96 Gy 8fx  . Hypercholesteremia    under control  . Myocardial infarction ([place])   . Numbness    right lower   . Numbness and tingling in left hand    numbess 2 fingers   . 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 Peripheral vascular disease ([place])   . PFO (patent foramen ovale)   . Prostate cancer ([place]) 2002  . Skin cancer    basal cell, squamous cell  . Squamous cell carcinoma of skin   . Stroke [facility])     Past Surgical  History:  Procedure Laterality Date  . CHOLECYSTECTOMY  04/2009   3 surgeries involved: lap chole x2 laparotomy  . CORONARY ARTERY BYPASS GRAFT  1991   triple  . ELBOW SURGERY  42 years ago   removal left radial head due to hockey accident  . EXPLORATORY LAPAROTOMY  04/2010   Dr. Redmond Pulling (3rd surgery for gall bladder)  . INGUINAL HERNIA REPAIR Right 09/23/2014   Procedure: RIGHT INGUINAL HERNIA REPAIR WITH MESH;  Surgeon: Jackolyn Confer, MD;  [facility];  Service: General;  Laterality: Right;  . INGUINAL HERNIA REPAIR Left 09/30/2015   Procedure:  HERNIA REPAIR LEFT INGUINAL ADULT;  Surgeon: Jackolyn Confer, MD;  [facility];  Service: General;  Laterality: Left;  . INSERTION OF MESH Left 09/30/2015   Procedure: INSERTION OF MESH;  Surgeon: Jackolyn Confer, MD;  [facility];  Service: General;  Laterality: Left;  Marland Kitchen MULTIPLE TOOTH EXTRACTIONS     prior to radiation  . PEG TUBE PLACEMENT  06/13/11 at [facility] IR   10/2011 removed  . PROSTATECTOMY  2002  . TONSILLECTOMY     x2: as child then 1985 for Left lingual tonsil removed  . TOTAL HIP ARTHROPLASTY Left 02/08/2016   Procedure: TOTAL HIP ARTHROPLASTY ANTERIOR APPROACH;  Surgeon: Frederik Pear, MD;  Location: [place];  Service: Orthopedics;  Laterality: Left;     Current Outpatient Medications  Medication Sig Dispense Refill  . aspirin EC 81 MG tablet Take 1 tablet (81 mg total) by mouth daily.    Marland Kitchen atorvastatin (LIPITOR) 40 MG tablet Take 1 tablet (40 mg total) by mouth daily at 6 PM. 90 tablet 3  . B Complex-Biotin-FA (VITAMIN B50 COMPLEX PO) Take 50 mg by mouth daily.    . calcium carbonate (OS-CAL) 600 MG TABS Take 1,200 mg by mouth every evening.     . cholecalciferol (VITAMIN D) 1000 UNITS tablet Take 1,000 Units by mouth daily.    . fish oil-omega-3 fatty acids 1000 MG capsule Take 1 g by mouth daily.     Marland Kitchen Hyaluronic Acid-Vitamin C (HYALURONIC ACID PO) Take 100 mg by mouth daily.    . L-Glutamine 500 MG TABS Take 500 mg by mouth every evening.     . Lecithin 1200 MG CAPS Take 1,200 mg by mouth daily.     . Misc Natural Products (OSTEO BI-FLEX JOINT SHIELD PO) Take 100 mg by mouth every evening. Pt not sure of dosage.    . Multiple Vitamin (MULTIVITAMIN WITH MINERALS) TABS tablet Take 1 tablet by mouth daily.    . niacinamide 500 MG tablet Take 500 mg by mouth every evening.    Marland Kitchen OVER THE COUNTER MEDICATION Take 20 mg by mouth daily. Lutigold    . pyridOXINE (VITAMIN B-6) 50 MG tablet Take 50 mg by mouth daily.    . Sodium Fluoride 1.1 % PSTE Apply thin ribbon of cream to  toothbrush. Brush teeth for 2 minutes at bedtime. Spit out excess. Do not swallow. 1 Bottle prn  . tretinoin (RETIN-A) 0.025 % cream Apply 1 application topically daily as needed (apply to face rash). As Directed    . Turmeric 450 MG CAPS Take 450 mg by mouth daily.     Marland Kitchen UBIQUINOL PO Take 100 mg by mouth daily.     Marland Kitchen VITAMIN A PO Take 1 tablet by mouth daily.    . vitamin C (ASCORBIC ACID) 500 MG tablet Take 500 mg by mouth 2 (two) times daily.     Marland Kitchen  vitamin E 400 UNIT capsule Take 400 Units by mouth every morning. Reported on 11/30/2015    . Zinc 25 MG TABS Take 25 mg by mouth daily.     No current facility-administered medications for this visit.    Facility-Administered Medications Ordered in Other Visits  Medication Dose Route Frequency Provider Last Rate Last Dose  . topical emolient (BIAFINE) emulsion   Topical BID Kyung Rudd, MD        Allergies:   Patient has no known allergies.    Social History:  The patient  reports that he quit smoking about 52 years ago. His smoking use included cigarettes. He has a 1.25 pack-year smoking history. He has never used smokeless tobacco. He reports that he does not drink alcohol or use drugs.   Family History:  The patient's family history includes Coronary artery disease in his brother; Stroke in his mother.    ROS: All other systems are reviewed and negative. Unless otherwise mentioned in H&P    PHYSICAL EXAM: VS:  BP (!) 152/90 (BP Location: Left Arm, Patient Position: Sitting)   Pulse 61   Ht 5\' 9"  (1.753 m)   Wt 174 lb 12.8 oz (79.3 kg)   BMI 25.81 kg/m  , BMI Body mass index is 25.81 kg/m. GEN: Well nourished, well developed, in no acute distress  HEENT: normal  Neck: no JVD, carotid bruits, or masses Cardiac: RRR; no murmurs, rubs, or gallops,no edema  Respiratory: Clear to auscultation bilaterally, normal work of breathing GI: soft, nontender, nondistended, + BS MS: no deformity or atrophy  Skin: warm and dry, no  rash Neuro:  Strength and sensation are intact Psych: euthymic mood, full affect   EKG:  NSR rate of 61 bpm.   Recent Labs: No results found for requested labs within last 8760 hours.    Lipid Panel    Component Value Date/Time   CHOL 137 12/28/2016 0923   TRIG 35 12/28/2016 0923   HDL 65 12/28/2016 0923   CHOLHDL 2.1 12/28/2016 0923   VLDL 7 12/28/2016 0923   LDLCALC 65 12/28/2016 0923   LDLDIRECT 168.1 12/15/2012 1539      Wt Readings from Last 3 Encounters:  03/31/18 174 lb 12.8 oz (79.3 kg)  04/01/17 173 lb (78.5 kg)  12/28/16 176 lb 12.8 oz (80.2 kg)      Other studies Reviewed: Echocardiogram 12/07/16. Left ventricle: The cavity size was normal. There was severe   focal basal hypertrophy. Systolic function was normal. The   estimated ejection fraction was in the range of 55% to 60%. Wall   motion was normal; there were no regional wall motion   abnormalities. There was an increased relative contribution of   atrial contraction to ventricular filling. Doppler parameters are   consistent with abnormal left ventricular relaxation (grade 1   diastolic dysfunction). - Aortic valve: Trileaflet; mildly thickened, moderately calcified   leaflets. - Aorta: Aortic root dimension: 48 mm (ED). Ascending aortic   diameter: 43 mm (S). - Aortic root: The aortic root was moderately dilated. - Ascending aorta: The ascending aorta was mildly dilated. - Mitral valve: Calcified annulus. - Right ventricle: The cavity size was moderately dilated. Wall   thickness was normal. 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 - Pulmonic valve: There was trivial regurgitation. - Pulmonary arteries: Systolic pressure could not be accurately   estimated.  Impressions:  - COmpared to prior echo, the aortic  root has increased from 95mm   to 60mm.   ASSESSMENT AND PLAN:  1. AAA: Will repeat echo for evaluation  size for aorta and for evaluation of LV function and PFO which was small per prior echo's.   2. Hypertension: He states he has white coat syndrome. He normally has BP in the 130's over 70's at home. I rechecked his BP in clinic and it was still found to be elevated. If BP remains elevated at home or on other occasions would recommend amlodipine 2.5 mg daily.   3. Hypercholesterolemia: Continues on statin therapy. Will check labs today.   Current medicines are reviewed at length with the patient today.    Labs/ tests ordered today include: BMET, Lipids and LFT;s and echo.  Phill Myron. West Pugh, ANP, AACC   03/31/2018 12:04 PM    [facility] [address], [place], [place] [address] Phone: [phone]; Fax: [phone]

## 2018-03-31 ENCOUNTER — Encounter: Payer: Self-pay | Admitting: Adult Health

## 2018-03-31 ENCOUNTER — Ambulatory Visit (INDEPENDENT_AMBULATORY_CARE_PROVIDER_SITE_OTHER): Payer: Medicare Other | Admitting: Adult Health

## 2018-03-31 VITALS — BP 152/90 | HR 61 | Ht 69.0 in | Wt 174.8 lb

## 2018-03-31 DIAGNOSIS — E785 Hyperlipidemia, unspecified: Secondary | ICD-10-CM | POA: Diagnosis not present

## 2018-03-31 DIAGNOSIS — Q211 Atrial septal defect: Secondary | ICD-10-CM

## 2018-03-31 DIAGNOSIS — I10 Essential (primary) hypertension: Secondary | ICD-10-CM | POA: Diagnosis not present

## 2018-03-31 DIAGNOSIS — Q2112 Patent foramen ovale: Secondary | ICD-10-CM

## 2018-03-31 DIAGNOSIS — I712 Thoracic aortic aneurysm, without rupture, unspecified: Secondary | ICD-10-CM

## 2018-03-31 DIAGNOSIS — Z79899 Other long term (current) drug therapy: Secondary | ICD-10-CM | POA: Diagnosis not present

## 2018-03-31 DIAGNOSIS — I519 Heart disease, unspecified: Secondary | ICD-10-CM | POA: Diagnosis not present

## 2018-03-31 MED ORDER — ASPIRIN EC 81 MG PO TBEC: 81.0000 mg | DELAYED_RELEASE_TABLET | Freq: Every day | ORAL | Status: DC

## 2018-03-31 NOTE — Patient Instructions (Addendum)
Medication Instructions:  NO CHANGES- Your physician recommends that you continue on your current medications as directed. Please refer to the Current Medication list given to you today.  If you need a refill on your cardiac medications before your next appointment, please call your pharmacy.  Labwork: A1C,BMET,CBC,FLP AND LFT TODAY HERE IN OUR OFFICE AT [facility]  Take the provided lab slips with you to the lab for your blood draw.   Testing/Procedures: Echocardiogram - Your physician has requested that you have an echocardiogram. Echocardiography is a painless test that uses sound waves to create images of your heart. It provides your doctor with information about the size and shape of your heart and how well your heart's chambers and valves are working. This procedure takes approximately one hour. There are no restrictions for this procedure. This will be performed at our [facility] location - [address].  Special Instructions: TAKE AND LOG BP-CALL IF ABNORMAL  Follow-Up: Your physician wants you to follow-up in: [place] should receive a reminder letter in the mail two months in advance. If you do not receive a letter, please call our office MAY 2020 to schedule the July 2020 follow-up appointment.   Thank you for choosing [facility] at [facility]!!

## 2018-04-01 ENCOUNTER — Other Ambulatory Visit: Payer: Self-pay

## 2018-04-01 DIAGNOSIS — E785 Hyperlipidemia, unspecified: Secondary | ICD-10-CM

## 2018-04-01 DIAGNOSIS — Z79899 Other long term (current) drug therapy: Secondary | ICD-10-CM

## 2018-04-01 LAB — HEPATIC FUNCTION PANEL
ALT: 16 IU/L (ref 0–44)
AST: 20 IU/L (ref 0–40)
Albumin: 4.2 g/dL (ref 3.5–4.8)
Alkaline Phosphatase: 88 IU/L (ref 39–117)
Bilirubin Total: 0.8 mg/dL (ref 0.0–1.2)
Bilirubin, Direct: 0.23 mg/dL (ref 0.00–0.40)
Total Protein: 6.5 g/dL (ref 6.0–8.5)

## 2018-04-01 LAB — BASIC METABOLIC PANEL
BUN/Creatinine Ratio: 27 — ABNORMAL HIGH (ref 10–24)
BUN: 30 mg/dL — ABNORMAL HIGH (ref 8–27)
CO2: 21 mmol/L (ref 20–29)
Calcium: 9.5 mg/dL (ref 8.6–10.2)
Chloride: 102 mmol/L (ref 96–106)
Creatinine, Ser: 1.12 mg/dL (ref 0.76–1.27)
GFR calc Af Amer: 73 mL/min/{1.73_m2} (ref >59)
GFR calc non Af Amer: 63 mL/min/{1.73_m2} (ref >59)
Glucose: 92 mg/dL (ref 65–99)
Potassium: 4.9 mmol/L (ref 3.5–5.2)
Sodium: 140 mmol/L (ref 134–144)

## 2018-04-01 LAB — CBC
Hematocrit: 45.4 % (ref 37.5–51.0)
Hemoglobin: 15.1 g/dL (ref 13.0–17.7)
MCH: 30.6 pg (ref 26.6–33.0)
MCHC: 33.3 g/dL (ref 31.5–35.7)
MCV: 92 fL (ref 79–97)
Platelets: 178 10*3/uL (ref 150–450)
RBC: 4.94 x10E6/uL (ref 4.14–5.80)
RDW: 13.8 % (ref 12.3–15.4)
WBC: 5.5 10*3/uL (ref 3.4–10.8)

## 2018-04-01 LAB — TSH: TSH: 4.84 u[IU]/mL — ABNORMAL HIGH (ref 0.450–4.500)

## 2018-04-01 LAB — LIPID PANEL
Chol/HDL Ratio: 2.4 ratio (ref 0.0–5.0)
Cholesterol, Total: 189 mg/dL (ref 100–199)
HDL: 80 mg/dL (ref >39)
LDL Calculated: 96 mg/dL (ref 0–99)
Triglycerides: 63 mg/dL (ref 0–149)
VLDL Cholesterol Cal: 13 mg/dL (ref 5–40)

## 2018-04-01 LAB — HEMOGLOBIN A1C
Est. average glucose Bld gHb Est-mCnc: 120 mg/dL
Hgb A1c MFr Bld: 5.8 % — ABNORMAL HIGH (ref 4.8–5.6)

## 2018-04-08 DIAGNOSIS — R252 Cramp and spasm: Secondary | ICD-10-CM | POA: Diagnosis not present

## 2018-04-08 DIAGNOSIS — R946 Abnormal results of thyroid function studies: Secondary | ICD-10-CM | POA: Diagnosis not present

## 2018-04-10 ENCOUNTER — Ambulatory Visit (HOSPITAL_COMMUNITY): Payer: Medicare Other | Attending: Cardiology

## 2018-04-10 ENCOUNTER — Other Ambulatory Visit: Payer: Self-pay

## 2018-04-10 DIAGNOSIS — E785 Hyperlipidemia, unspecified: Secondary | ICD-10-CM | POA: Insufficient documentation

## 2018-04-10 DIAGNOSIS — I252 Old myocardial infarction: Secondary | ICD-10-CM | POA: Insufficient documentation

## 2018-04-10 DIAGNOSIS — I081 Rheumatic disorders of both mitral and tricuspid valves: Secondary | ICD-10-CM | POA: Insufficient documentation

## 2018-04-10 DIAGNOSIS — Z951 Presence of aortocoronary bypass graft: Secondary | ICD-10-CM | POA: Diagnosis not present

## 2018-04-10 DIAGNOSIS — I110 Hypertensive heart disease with heart failure: Secondary | ICD-10-CM | POA: Insufficient documentation

## 2018-04-10 DIAGNOSIS — I509 Heart failure, unspecified: Secondary | ICD-10-CM | POA: Diagnosis not present

## 2018-04-10 DIAGNOSIS — Q211 Atrial septal defect: Secondary | ICD-10-CM | POA: Insufficient documentation

## 2018-04-10 DIAGNOSIS — I739 Peripheral vascular disease, unspecified: Secondary | ICD-10-CM | POA: Insufficient documentation

## 2018-04-10 DIAGNOSIS — I712 Thoracic aortic aneurysm, without rupture, unspecified: Secondary | ICD-10-CM

## 2018-04-10 DIAGNOSIS — I253 Aneurysm of heart: Secondary | ICD-10-CM | POA: Diagnosis not present

## 2018-04-10 DIAGNOSIS — I639 Cerebral infarction, unspecified: Secondary | ICD-10-CM | POA: Insufficient documentation

## 2018-04-10 DIAGNOSIS — Q2112 Patent foramen ovale: Secondary | ICD-10-CM

## 2018-04-16 ENCOUNTER — Other Ambulatory Visit: Payer: Self-pay | Admitting: Family Medicine

## 2018-04-16 ENCOUNTER — Ambulatory Visit
Admission: RE | Admit: 2018-04-16 | Discharge: 2018-04-16 | Disposition: A | Discharge status: Home / Self Care | Payer: Medicare Other | Source: Ambulatory Visit | Attending: Family Medicine | Admitting: Family Medicine

## 2018-04-16 DIAGNOSIS — M549 Dorsalgia, unspecified: Secondary | ICD-10-CM

## 2018-04-28 ENCOUNTER — Other Ambulatory Visit: Payer: Self-pay

## 2018-04-28 ENCOUNTER — Other Ambulatory Visit: Payer: Self-pay | Admitting: Family Medicine

## 2018-04-28 ENCOUNTER — Ambulatory Visit
Admission: RE | Admit: 2018-04-28 | Discharge: 2018-04-28 | Disposition: A | Discharge status: Home / Self Care | Payer: Medicare Other | Source: Ambulatory Visit | Attending: Family Medicine | Admitting: Family Medicine

## 2018-04-28 DIAGNOSIS — I712 Thoracic aortic aneurysm, without rupture, unspecified: Secondary | ICD-10-CM

## 2018-04-28 DIAGNOSIS — M1611 Unilateral primary osteoarthritis, right hip: Secondary | ICD-10-CM | POA: Diagnosis not present

## 2018-04-28 DIAGNOSIS — M545 Low back pain, unspecified: Secondary | ICD-10-CM

## 2018-04-28 DIAGNOSIS — M25551 Pain in right hip: Secondary | ICD-10-CM

## 2018-04-28 DIAGNOSIS — M4186 Other forms of scoliosis, lumbar region: Secondary | ICD-10-CM | POA: Diagnosis not present

## 2018-04-28 DIAGNOSIS — M5136 Other intervertebral disc degeneration, lumbar region: Secondary | ICD-10-CM | POA: Diagnosis not present

## 2018-05-07 DIAGNOSIS — D045 Carcinoma in situ of skin of trunk: Secondary | ICD-10-CM | POA: Diagnosis not present

## 2018-05-07 DIAGNOSIS — D0471 Carcinoma in situ of skin of right lower limb, including hip: Secondary | ICD-10-CM | POA: Diagnosis not present

## 2018-05-07 DIAGNOSIS — Z85828 Personal history of other malignant neoplasm of skin: Secondary | ICD-10-CM | POA: Diagnosis not present

## 2018-05-07 DIAGNOSIS — Z85820 Personal history of malignant melanoma of skin: Secondary | ICD-10-CM | POA: Diagnosis not present

## 2018-05-07 DIAGNOSIS — L219 Seborrheic dermatitis, unspecified: Secondary | ICD-10-CM | POA: Diagnosis not present

## 2018-05-07 DIAGNOSIS — D0472 Carcinoma in situ of skin of left lower limb, including hip: Secondary | ICD-10-CM | POA: Diagnosis not present

## 2018-05-07 DIAGNOSIS — D485 Neoplasm of uncertain behavior of skin: Secondary | ICD-10-CM | POA: Diagnosis not present

## 2018-05-07 DIAGNOSIS — D1730 Benign lipomatous neoplasm of skin and subcutaneous tissue of unspecified sites: Secondary | ICD-10-CM | POA: Diagnosis not present

## 2018-05-07 DIAGNOSIS — L570 Actinic keratosis: Secondary | ICD-10-CM | POA: Diagnosis not present

## 2018-05-07 DIAGNOSIS — C44712 Basal cell carcinoma of skin of right lower limb, including hip: Secondary | ICD-10-CM | POA: Diagnosis not present

## 2018-05-30 ENCOUNTER — Other Ambulatory Visit: Payer: Self-pay | Admitting: *Deleted

## 2018-05-30 MED ORDER — ATORVASTATIN CALCIUM 40 MG PO TABS: 40.0000 mg | ORAL_TABLET | Freq: Every day | ORAL | 3 refills | Status: DC

## 2018-06-11 DIAGNOSIS — Z23 Encounter for immunization: Secondary | ICD-10-CM | POA: Diagnosis not present

## 2018-06-12 DIAGNOSIS — D045 Carcinoma in situ of skin of trunk: Secondary | ICD-10-CM | POA: Diagnosis not present

## 2018-07-24 DIAGNOSIS — M7061 Trochanteric bursitis, right hip: Secondary | ICD-10-CM | POA: Diagnosis not present

## 2018-08-19 DIAGNOSIS — Z23 Encounter for immunization: Secondary | ICD-10-CM | POA: Diagnosis not present

## 2018-08-19 DIAGNOSIS — D0422 Carcinoma in situ of skin of left ear and external auricular canal: Secondary | ICD-10-CM | POA: Diagnosis not present

## 2018-08-19 DIAGNOSIS — Z85820 Personal history of malignant melanoma of skin: Secondary | ICD-10-CM | POA: Diagnosis not present

## 2018-08-19 DIAGNOSIS — C44729 Squamous cell carcinoma of skin of left lower limb, including hip: Secondary | ICD-10-CM | POA: Diagnosis not present

## 2018-08-19 DIAGNOSIS — D485 Neoplasm of uncertain behavior of skin: Secondary | ICD-10-CM | POA: Diagnosis not present

## 2018-08-19 DIAGNOSIS — L821 Other seborrheic keratosis: Secondary | ICD-10-CM | POA: Diagnosis not present

## 2018-08-19 DIAGNOSIS — C44722 Squamous cell carcinoma of skin of right lower limb, including hip: Secondary | ICD-10-CM | POA: Diagnosis not present

## 2018-08-19 DIAGNOSIS — L570 Actinic keratosis: Secondary | ICD-10-CM | POA: Diagnosis not present

## 2018-08-19 DIAGNOSIS — Z85828 Personal history of other malignant neoplasm of skin: Secondary | ICD-10-CM | POA: Diagnosis not present

## 2018-09-15 DIAGNOSIS — C44712 Basal cell carcinoma of skin of right lower limb, including hip: Secondary | ICD-10-CM | POA: Diagnosis not present

## 2018-09-24 ENCOUNTER — Telehealth: Payer: Self-pay | Admitting: Adult Health

## 2018-09-24 NOTE — Telephone Encounter (Signed)
Left message to call back  

## 2018-09-24 NOTE — Telephone Encounter (Signed)
New Message     Pt c/o BP issue: STAT if pt c/o blurred vision, one-sided weakness or slurred speech  1. What are your last 5 BP readings? 165/101, 165/93,141/90, 145/90 103/77  2. Are you having any other symptoms (ex. Dizziness, headache, blurred vision, passed out)? NO   3. What is your BP issue? Patient blood pressure has been going up and was told to call back in to be prescribed BP medication if he had this issue.

## 2018-09-26 DIAGNOSIS — G5622 Lesion of ulnar nerve, left upper limb: Secondary | ICD-10-CM | POA: Diagnosis not present

## 2018-09-26 NOTE — Telephone Encounter (Signed)
[name] walked in concerning his elevated BP. He states he was unable to get thru on our phones. He reports his BP has been creeping up over the last 2-3 months and he has recently had "no normal" BP readings. He takes no medications for BP. Advised patient he will need an office visit to assess. He was taken to scheduling to arrange an appointment. He was asked to check BP at home and bring log & BP cuff to his next appointment. He has been scheduled Jan 14 with Jason Long DNP. He reports he is having a minor procedure on his arm next Thursday Jan 16 so he needed and OV prior or the following week   Advised would notify MD as [name].

## 2018-09-29 ENCOUNTER — Telehealth: Payer: Self-pay | Admitting: Cardiology

## 2018-09-29 ENCOUNTER — Other Ambulatory Visit: Payer: Self-pay | Admitting: Orthopedic Surgery

## 2018-09-29 NOTE — Telephone Encounter (Signed)
° °  [facility] Pre-operative Risk Assessment    Request for surgical clearance:  1. What type of surgery is being performed?  Ulnar Nerve Decompression on left Elbow   2. When is this surgery scheduled? 10-16-18   3. What type of clearance is required (medical clearance vs. Pharmacy clearance to hold med vs. Both)? Both  4. Are there any medications that need to be held prior to surgery and how long? Does any of his medicine needs to be stopped?  5. Practice name and name of physician performing surgery?  Dr Tania Noble   6. What is your office phone number [phone]    7.   What is your office fax number [phone]  8.   Anesthesia type (None, local, MAC, general) ? Choice   Jason Noble 09/29/2018, 11:51 AM  _________________________________________________________________   (provider comments below)

## 2018-09-30 ENCOUNTER — Ambulatory Visit (INDEPENDENT_AMBULATORY_CARE_PROVIDER_SITE_OTHER): Payer: Medicare Other | Admitting: Adult Health

## 2018-09-30 ENCOUNTER — Encounter: Payer: Self-pay | Admitting: Adult Health

## 2018-09-30 VITALS — BP 154/72 | HR 65 | Ht 69.0 in | Wt 178.2 lb

## 2018-09-30 DIAGNOSIS — E7800 Pure hypercholesterolemia, unspecified: Secondary | ICD-10-CM | POA: Diagnosis not present

## 2018-09-30 DIAGNOSIS — Z79899 Other long term (current) drug therapy: Secondary | ICD-10-CM

## 2018-09-30 DIAGNOSIS — I10 Essential (primary) hypertension: Secondary | ICD-10-CM

## 2018-09-30 DIAGNOSIS — E785 Hyperlipidemia, unspecified: Secondary | ICD-10-CM

## 2018-09-30 DIAGNOSIS — I2510 Atherosclerotic heart disease of native coronary artery without angina pectoris: Secondary | ICD-10-CM | POA: Diagnosis not present

## 2018-09-30 DIAGNOSIS — Z01811 Encounter for preprocedural respiratory examination: Secondary | ICD-10-CM | POA: Diagnosis not present

## 2018-09-30 MED ORDER — AMLODIPINE BESYLATE 2.5 MG PO TABS: 2.5000 mg | ORAL_TABLET | Freq: Every day | ORAL | 3 refills | Status: DC

## 2018-09-30 NOTE — Progress Notes (Signed)
Cardiology Office Note   Date:  09/30/2018   ID:  Jason Noble, DOB 30-Apr-1941, MRN [id number]  PCP:  Shirline Frees, MD  Cardiologist:  Dr. Claiborne Billings  Chief Complaint  Patient presents with  . Follow-up    BP     History of Present Illness: Jason Noble is a 78 y.o. male who presents for  ongoing assessment and management of coronary artery disease, history of CABG in 1991, bare-metal stent to the SVG in 2008, echocardiogram completed in April 2016 revealed normal LV systolic function with grade 1 diastolic dysfunction, mild left atrial enlargement and mild tricuspid regurg.  Ascending aorta was noted to be measured at 4.5 cm.  The patient was last seen in the office on 04/01/2017 by Dr. Stanford Noble.  At that time, the patient was asymptomatic and continued on current medication regimen.  Repeat echocardiogram was ordered to evaluate aortic root in the setting of prior aortic aneurysm.  Last seen on 03/31/2018 with complaints of chronic right trochanter bursitis pain and is being followed by orthopedics with recent steroid injection. He has had some mild dizziness but no significant episodes. He remains active but not longer runs as he did in the past due to hip pain.   Echocardiogram was ordered on last visit which revealed normal LV function and Grade I diastolic dysfunction. He was also found to have elevated BP He admitted to white coat syndrome. If elevated today may need amlodipine.  He is here for pre-operative evaluation for ulnar nerve decompression/tranposition on 10/16/2018 with Dr. Tania Noble.  He denies chest pain, DOE, fatigue. He is medically compliant.   Past Medical History:  Diagnosis Date  . Abscess of gallbladder   . 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 Basal cell carcinoma of skin   . Broken jaw (Jason Noble)    right lower   . Cholecystitis   . Coronary heart disease    has a stent  . CVA (cerebral infarction) 2007   "mild"  . Fatigue 07/26/2011  .  History of cancer chemotherapy   . History of radiation therapy 06/18/2011- 08/03/2011   squamous cell tongue/69.96 Gy 22fx  . Hypercholesteremia    under control  . Myocardial infarction (Jason Noble)   . Numbness    right lower   . Numbness and tingling in left hand    numbess 2 fingers   . 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 Peripheral vascular disease (Jason Noble)   . PFO (patent foramen ovale)   . Prostate cancer (Jason Noble) 2002  . Skin cancer    basal cell, squamous cell  . Squamous cell carcinoma of skin   . Stroke [facility])     Past Surgical History:  Procedure Laterality Date  . CHOLECYSTECTOMY  04/2009   3 surgeries involved: lap chole x2 laparotomy  . CORONARY ARTERY BYPASS GRAFT  1991   triple  . ELBOW SURGERY  42 years ago   removal left radial head due to hockey accident  . EXPLORATORY LAPAROTOMY  04/2010   Dr. Redmond Noble (3rd surgery for gall bladder)  . INGUINAL HERNIA REPAIR Right 09/23/2014   Procedure: RIGHT INGUINAL HERNIA REPAIR WITH MESH;  Surgeon: Jason Confer, MD;  Location: [facility];  Service: General;  Laterality: Right;  . INGUINAL HERNIA REPAIR Left 09/30/2015   Procedure: HERNIA REPAIR LEFT INGUINAL ADULT;  Surgeon: Jason Confer, MD;  Location: [facility];  Service: General;  Laterality: Left;  . INSERTION OF  MESH Left 09/30/2015   Procedure: INSERTION OF MESH;  Surgeon: Jason Confer, MD;  Location: [facility];  Service: General;  Laterality: Left;  Jason Noble MULTIPLE TOOTH EXTRACTIONS     prior to radiation  . PEG TUBE PLACEMENT  06/13/11 at [facility]   10/2011 removed  . PROSTATECTOMY  2002  . TONSILLECTOMY     x2: as child then 1985 for Left lingual tonsil removed  . TOTAL HIP ARTHROPLASTY Left 02/08/2016   Procedure: TOTAL HIP ARTHROPLASTY ANTERIOR APPROACH;  Surgeon: Jason Pear, MD;  Location: [place];  Service: Orthopedics;  Laterality: Left;     Current Outpatient Medications  Medication Sig Dispense Refill  . aspirin EC 81 MG tablet Take 1 tablet (81 mg total) by mouth  daily.    Jason Noble atorvastatin (LIPITOR) 40 MG tablet Take 1 tablet (40 mg total) by mouth daily at 6 PM. 90 tablet 3  . B Complex-Biotin-FA (VITAMIN B50 COMPLEX PO) Take 50 mg by mouth daily.    . calcium carbonate (OS-CAL) 600 MG TABS Take 1,200 mg by mouth every evening.     . cholecalciferol (VITAMIN D) 1000 UNITS tablet Take 1,000 Units by mouth daily.    . fish oil-omega-3 fatty acids 1000 MG capsule Take 1 g by mouth daily.     Jason Noble Hyaluronic Acid-Vitamin C (HYALURONIC ACID PO) Take 100 mg by mouth daily.    . L-Glutamine 500 MG TABS Take 500 mg by mouth every evening.     . Lecithin 1200 MG CAPS Take 1,200 mg by mouth daily.     . Misc Natural Products (OSTEO BI-FLEX JOINT SHIELD PO) Take 100 mg by mouth every evening. Pt not sure of dosage.    . Multiple Vitamin (MULTIVITAMIN WITH MINERALS) TABS tablet Take 1 tablet by mouth daily.    . niacinamide 500 MG tablet Take 500 mg by mouth every evening.    Jason Noble OVER THE COUNTER MEDICATION Take 20 mg by mouth daily. Lutigold    . pyridOXINE (VITAMIN B-6) 50 MG tablet Take 50 mg by mouth daily.    . Sodium Fluoride 1.1 % PSTE Apply thin ribbon of cream to toothbrush. Brush teeth for 2 minutes at bedtime. Spit out excess. Do not swallow. 1 Bottle prn  . tretinoin (RETIN-A) 0.025 % cream Apply 1 application topically daily as needed (apply to face rash). As Directed    . Turmeric 450 MG CAPS Take 450 mg by mouth daily.     Jason Noble UBIQUINOL PO Take 100 mg by mouth daily.     Jason Noble VITAMIN A PO Take 1 tablet by mouth daily.    . vitamin C (ASCORBIC ACID) 500 MG tablet Take 500 mg by mouth 2 (two) times daily.     . vitamin E 400 UNIT capsule Take 400 Units by mouth every morning. Reported on 11/30/2015    . Zinc 25 MG TABS Take 25 mg by mouth daily.     No current facility-administered medications for this visit.    Facility-Administered Medications Ordered in Other Visits  Medication Dose Route Frequency Provider Last Rate Last Dose  . topical emolient (BIAFINE)  emulsion   Topical BID Jason Rudd, MD        Allergies:   Patient has no known allergies.    Social History:  The patient  reports that he quit smoking about 52 years ago. His smoking use included cigarettes. He has a 1.25 pack-year smoking history. He has never used smokeless tobacco. He reports that  he does not drink alcohol or use drugs.   Family History:  The patient's family history includes Coronary artery disease in his brother; Stroke in his mother.    ROS: All other systems are reviewed and negative. Unless otherwise mentioned in H&P    PHYSICAL EXAM: VS:  BP (!) 154/72 (BP Location: Left Arm)   Pulse 65   Ht 5\' 9"  (1.753 m)   Wt 178 lb 3.2 oz (80.8 kg)   BMI 26.32 kg/m  , BMI Body mass index is 26.32 kg/m. GEN: Well nourished, well developed, in no acute distress HEENT: normal Neck: no JVD, carotid bruits, or masses Cardiac: IRRR; no murmurs, rubs, or gallops,no edema  Respiratory:  Clear to auscultation bilaterally, normal work of breathing GI: soft, nontender, nondistended, + BS MS: Some deformity of the left arm at the elbow not allowing full ROM.  Skin: warm and dry, no rash Neuro:  Strength and sensation are intact Psych: euthymic mood, full affect   EKG:  SR rate of 65 bpm with frequent PAC's.   Recent Labs: 03/31/2018: ALT 16; BUN 30; Creatinine, Ser 1.12; Hemoglobin 15.1; Platelets 178; Potassium 4.9; Sodium 140; TSH 4.840    Lipid Panel    Component Value Date/Time   CHOL 189 03/31/2018 0000   TRIG 63 03/31/2018 0000   HDL 80 03/31/2018 0000   CHOLHDL 2.4 03/31/2018 0000   CHOLHDL 2.1 12/28/2016 0923   VLDL 7 12/28/2016 0923   LDLCALC 96 03/31/2018 0000   LDLDIRECT 168.1 12/15/2012 1539      Wt Readings from Last 3 Encounters:  09/30/18 178 lb 3.2 oz (80.8 kg)  03/31/18 174 lb 12.8 oz (79.3 kg)  04/01/17 173 lb (78.5 kg)      Other studies Reviewed: Echocardiogram May 04, 2018 Left ventricle: The cavity size was normal. Wall thickness  was   increased in a pattern of mild LVH. Systolic function was normal.   The estimated ejection fraction was in the range of 55% to 60%.   Wall motion was normal; there were no regional wall motion   abnormalities. Doppler parameters are consistent with abnormal   left ventricular relaxation (grade 1 diastolic dysfunction). - Aortic root: The aortic root was moderately dilated. - Mitral valve: Calcified annulus. - Left atrium: The atrium was mildly dilated. - Atrial septum: There was an atrial septal aneurysm.  Impressions:  - Normal LV systolic function; mild LVH; mild diastolic   dysfunction; moderately dilated aortic root (4.6 cm); suggest CTA   or MRA to further assess; mild LAE; atrial septal aneurysm.   ASSESSMENT AND PLAN:  1. Pre-Operative Cardiac clearance:   Chart reviewed as part of pre-operative protocol coverage. Given past medical history and time since last visit, based on ACC/AHA guidelines, Jordynn Perrier [place]wick would be at acceptable risk for the planned procedure without further cardiovascular testing.   2. CAD: Hx of CABG in 1991. He denies chest pain of DOE. He is medically compliant. I have asked him to decrease his caffeine intake to avoid cardiac arrhythmias. He is having some PAC's today. He has being drinking a good deal of caffeine today.   3. Hypertension: Has been elevated on his home log as high as 188/90, Average BP 160/80. I will add amlodipine 2.5 mg daily, as he has had some low BP as well. He will continue to take his BP at home and record. He will bring it back to his next appointment.   4. Hyperlipidemia Goal of LDL is <70 for him.  Last blood drawn in July of 2019, LDL was 90. He will have follow up labs prior to being seen by Dr. Stanford Noble next visit.   Current medicines are reviewed at length with the patient today.    Labs/ tests ordered today include: BMET, Fasting Lipids and LFT;s , CBC.   Phill Myron. [place] Pugh, ANP, AACC   09/30/2018 2:52  PM    [facility] [place] [address] Office [phone] Fax [phone]

## 2018-09-30 NOTE — Telephone Encounter (Signed)
Will address this during follow up visit today.

## 2018-09-30 NOTE — Patient Instructions (Addendum)
Medication Instructions:  START AMLODIPINE 2.5MG  DAILY If you need a refill on your cardiac medications before your next appointment, please call your pharmacy.  Labwork: LFT,LIPID,CBC AND BMET HERE IN OUR OFFICE AT [facility]  , 3-5 DAYS BEFORE FOLLOW UP APPOINTMENT. You will need to fast. DO NOT EAT OR DRINK PAST MIDNIGHT.       Take the provided lab slips with you to the lab for your blood draw.  When you have your labs (blood work) drawn today and your tests are completely normal, you will receive your results only by MyChart Message (if you have MyChart) -OR-  A paper copy in the mail.  If you have any lab test that is abnormal or we need to change your treatment, we will call you to review these results.  Special Instructions: CLEARED FOR UPCOMING SURGERY PLEASE CONTINUE TO TAKE ANG LOG YOU BP AND HEART-RATE  Follow-Up: You will need a follow up appointment in  July 2020.  Please call our office 2 months(MAY) in advance to schedule this (July  2020) appointment.  You may see Kirk Ruths, MD Jory Sims, DNP, AACC  or one of the following Advanced Practice Providers on your designated Care Team:  Kerin Ransom, PA-C  Roby Lofts, PA-C  Sande Rives, [place]      At [facility], you and your health needs are our priority.  As part of our continuing mission to provide you with exceptional heart care, we have created designated Provider Care Teams.  These Care Teams include your primary Cardiologist (physician) and Advanced Practice Providers (APPs -  Physician Assistants and Nurse Practitioners) who all work together to provide you with the care you need, when you need it.  Thank you for choosing [facility] at [facility]!!

## 2018-10-02 DIAGNOSIS — L0100 Impetigo, unspecified: Secondary | ICD-10-CM | POA: Diagnosis not present

## 2018-10-02 DIAGNOSIS — Z411 Encounter for cosmetic surgery: Secondary | ICD-10-CM | POA: Diagnosis not present

## 2018-10-02 DIAGNOSIS — D0439 Carcinoma in situ of skin of other parts of face: Secondary | ICD-10-CM | POA: Diagnosis not present

## 2018-10-02 DIAGNOSIS — D0471 Carcinoma in situ of skin of right lower limb, including hip: Secondary | ICD-10-CM | POA: Diagnosis not present

## 2018-10-02 DIAGNOSIS — D0472 Carcinoma in situ of skin of left lower limb, including hip: Secondary | ICD-10-CM | POA: Diagnosis not present

## 2018-10-02 DIAGNOSIS — C44229 Squamous cell carcinoma of skin of left ear and external auricular canal: Secondary | ICD-10-CM | POA: Diagnosis not present

## 2018-10-07 ENCOUNTER — Telehealth: Payer: Self-pay | Admitting: Adult Health

## 2018-10-07 NOTE — Telephone Encounter (Signed)
Spoke with pt. Pt sts that he was started on Amlodipine 2.5mg  daily on 09/30/18 by Jory Sims, DNP Pt sts that he BP has remained elevated with the addition of Amlodipine and would like to know if he needs to increase it to 5mg  daily. Asked the pt to provide some of his BP readings. 10/03/18 am 145/97 65bpm, pm 142/96 70bpm 10/04/18 172/94 66bpm 10/05/18 am 115/71 69bpm, pm 145/85 pm 10/06/18 148/92 10/07/18 154/93  Adv pt that I will fwd an update to Verdell Carmine., DNP and call back with her recommendation. Pt is agreeable with plan and voiced appreciation for the call back.  Pt rqst we call his cell # for the return call.

## 2018-10-07 NOTE — Telephone Encounter (Signed)
New Message   Pt c/o medication issue:  1. Name of Medication: Amlodipine  2. How are you currently taking this medication (dosage and times per day)? 2.5mg  once a day  3. Are you having a reaction (difficulty breathing--STAT)? No  4. What is your medication issue? Patient states current dose hasn't made a change in BP and would like to increase the medication dosage to 5mg .  Calling to make sure it would be ok.

## 2018-10-08 MED ORDER — AMLODIPINE BESYLATE 5 MG PO TABS: 5.0000 mg | ORAL_TABLET | Freq: Every day | ORAL | Status: DC

## 2018-10-08 NOTE — Telephone Encounter (Signed)
Called to give pt Jason Sims, DNP recommendation. lmom on pt cell as he requested.  Yes, please increase amlodipine to 5 mg daily. Keep taking BP and records. Thank you.   Pt is to call back if any question or concerns.

## 2018-10-08 NOTE — Telephone Encounter (Signed)
Yes, please increase amlodipine to 5 mg daily. Keep taking BP and records. Thank you.

## 2018-10-08 NOTE — Pre-Procedure Instructions (Signed)
Jason Noble  10/08/2018     Your procedure is scheduled on October 16, 2018.  Report to [facility] Admitting at 10:45 A.M.  Call this number if you have problems the morning of surgery:  [phone]   Remember:  Do not eat or drink after midnight.    Take these medicines the morning of surgery with A SIP OF WATER : Amlodipine (Norvasc)  Follow your Doctor's instructions regarding your Aspirin.  7 days prior to surgery STOP taking any Aspirin (unless otherwise instructed by your surgeon), Aleve, Naproxen, Ibuprofen, Motrin, Advil, Goody's, BC's, all herbal medications, fish oil, and all vitamins.    Do not wear jewelry, make-up or nail polish.  Do not wear lotions, powders, or cologne, or deodorant.  Do not shave 48 hours prior to surgery.  Men may shave face and neck.  Do not bring valuables to the hospital.  [facility] is not responsible for any belongings or valuables.  Contacts, dentures or bridgework may not be worn into surgery.  Leave your suitcase in the car.  After surgery it may be brought to your room.  For patients admitted to the hospital, discharge time will be determined by your treatment team.  Patients discharged the day of surgery will not be allowed to drive home.   Special instructions:   [facility]- Preparing For Surgery  Before surgery, you can play an important role. Because skin is not sterile, your skin needs to be as free of germs as possible. You can reduce the number of germs on your skin by washing with CHG (chlorahexidine gluconate) Soap before surgery.  CHG is an antiseptic cleaner which kills germs and bonds with the skin to continue killing germs even after washing.    Oral Hygiene is also important to reduce your risk of infection.  Remember - BRUSH YOUR TEETH THE MORNING OF SURGERY WITH YOUR REGULAR TOOTHPASTE  Please do not use if you have an allergy to CHG or antibacterial soaps. If your skin becomes reddened/irritated  stop using the CHG.  Do not shave (including legs and underarms) for at least 48 hours prior to first CHG shower. It is OK to shave your face.  Please follow these instructions carefully.   1. Shower the NIGHT BEFORE SURGERY and the MORNING OF SURGERY with CHG.   2. If you chose to wash your hair, wash your hair first as usual with your normal shampoo.  3. After you shampoo, rinse your hair and body thoroughly to remove the shampoo.  4. Use CHG as you would any other liquid soap. You can apply CHG directly to the skin and wash gently with a scrungie or a clean washcloth.   5. Apply the CHG Soap to your body ONLY FROM THE NECK DOWN.  Do not use on open wounds or open sores. Avoid contact with your eyes, ears, mouth and genitals (private parts). Wash Face and genitals (private parts)  with your normal soap.  6. Wash thoroughly, paying special attention to the area where your surgery will be performed.  7. Thoroughly rinse your body with warm water from the neck down.  8. DO NOT shower/wash with your normal soap after using and rinsing off the CHG Soap.  9. Pat yourself dry with a CLEAN TOWEL.  10. Wear CLEAN PAJAMAS to bed the night before surgery, wear comfortable clothes the morning of surgery  11. Place CLEAN SHEETS on your bed the night of your first shower and  DO NOT SLEEP WITH PETS.    Day of Surgery:  Do not apply any deodorants/lotions.  Please wear clean clothes to the hospital/surgery center.   Remember to brush your teeth WITH YOUR REGULAR TOOTHPASTE.    Please read over the following fact sheets that you were given.

## 2018-10-09 ENCOUNTER — Other Ambulatory Visit: Payer: Self-pay

## 2018-10-09 ENCOUNTER — Encounter (HOSPITAL_COMMUNITY)
Admission: RE | Admit: 2018-10-09 | Discharge: 2018-10-09 | Disposition: A | Discharge status: Home / Self Care | Payer: Medicare Other | Source: Ambulatory Visit | Attending: Orthopedic Surgery | Admitting: Orthopedic Surgery

## 2018-10-09 ENCOUNTER — Encounter (HOSPITAL_COMMUNITY): Payer: Self-pay

## 2018-10-09 DIAGNOSIS — Z01812 Encounter for preprocedural laboratory examination: Secondary | ICD-10-CM | POA: Diagnosis not present

## 2018-10-09 LAB — BASIC METABOLIC PANEL
Anion gap: 10 (ref 5–15)
BUN: 24 mg/dL — ABNORMAL HIGH (ref 8–23)
CO2: 25 mmol/L (ref 22–32)
Calcium: 9.6 mg/dL (ref 8.9–10.3)
Chloride: 104 mmol/L (ref 98–111)
Creatinine, Ser: 1.09 mg/dL (ref 0.61–1.24)
GFR calc Af Amer: >60 mL/min (ref >60)
GFR calc non Af Amer: >60 mL/min (ref >60)
Glucose, Bld: 81 mg/dL (ref 70–99)
Potassium: 4.4 mmol/L (ref 3.5–5.1)
Sodium: 139 mmol/L (ref 135–145)

## 2018-10-09 LAB — CBC
HCT: 46.6 % (ref 39.0–52.0)
Hemoglobin: 15.3 g/dL (ref 13.0–17.0)
MCH: 30.4 pg (ref 26.0–34.0)
MCHC: 32.8 g/dL (ref 30.0–36.0)
MCV: 92.6 fL (ref 80.0–100.0)
Platelets: 178 10*3/uL (ref 150–400)
RBC: 5.03 MIL/uL (ref 4.22–5.81)
RDW: 12.7 % (ref 11.5–15.5)
WBC: 7.1 10*3/uL (ref 4.0–10.5)
nRBC: 0 % (ref 0.0–0.2)

## 2018-10-09 NOTE — Progress Notes (Signed)
PCP - Dr. Samara Snide  LOV 03/2018  Cardiologist - Dr. Osvaldo Angst  LOV 09/29/2018   Clearance note in epic  Chest x-ray - n/a EKG - 09/30/2018  Stress Test -   ECHO - 03/2018  Cardiac Cath -   Sleep Study - n/a CPAP - n/a  Fasting Blood Sugar - n/a Checks Blood Sugar _____ times a day  Blood Thinner Instructions:  The patient will stop his aspirin 2 days prior to surgery.  No real instructions from cardio. Aspirin Instructions:  Anesthesia review: yes  Patient denies shortness of breath, fever, cough and chest pain at PAT appointment   Patient verbalized understanding of instructions that were given to them at the PAT appointment. Patient was also instructed that they will need to review over the PAT instructions again at home before surgery.

## 2018-10-09 NOTE — Pre-Procedure Instructions (Signed)
Baldomero Mirarchi Hammersmith  10/09/2018     Your procedure is scheduled on Thursday,  October 16, 2018.   Report to [facility] Admitting at 10:45 A.M.             (posted surgery time 12:45p - 1:58p)   Call this number if you have problems the morning of surgery:  [phone]   Remember:  Do not eat any foods or drink any liquids after midnight, Wednesday.    Take these medicines the morning of surgery with A SIP OF WATER : Amlodipine (Norvasc)  Follow your Doctor's instructions regarding your Aspirin.  7 days prior to surgery STOP taking any Aspirin (unless otherwise instructed by your surgeon), Aleve, Naproxen, Ibuprofen, Motrin, Advil, Goody's, BC's, all herbal medications, fish oil, and all vitamins.    Do not wear jewelry - no rings or watches.  Do not wear lotions, cologne, or deodorant.   Men may shave face and neck.  Do not bring valuables to the hospital.  [facility] is not responsible for any belongings or valuables.  Contacts, dentures or bridgework may not be worn into surgery.  Leave your suitcase in the car.  After surgery it may be brought to your room.  For patients admitted to the hospital, discharge time will be determined by your treatment team.  Patients discharged the day of surgery will not be allowed to drive home, AND will need to have a responsible adult stay with you for the first 24 hrs.     Special instructions:   [facility]- Preparing For Surgery  Before surgery, you can play an important role. Because skin is not sterile, your skin needs to be as free of germs as possible. You can reduce the number of germs on your skin by washing with CHG (chlorahexidine gluconate) Soap before surgery.  CHG is an antiseptic cleaner which kills germs and bonds with the skin to continue killing germs even after washing.    Oral Hygiene is also important to reduce your risk of infection.    Remember - BRUSH YOUR TEETH THE MORNING OF SURGERY WITH YOUR  REGULAR TOOTHPASTE  Please do not use if you have an allergy to CHG or antibacterial soaps. If your skin becomes reddened/irritated stop using the CHG.  Do not shave (including legs and underarms) for at least 48 hours prior to first CHG shower. It is OK to shave your face.  Please follow these instructions carefully.   1. Shower the NIGHT BEFORE SURGERY and the MORNING OF SURGERY with CHG.   2. If you chose to wash your hair, wash your hair first as usual with your normal shampoo.  3. After you shampoo, rinse your hair and body thoroughly to remove the shampoo.  4. Use CHG as you would any other liquid soap. You can apply CHG directly to the skin and wash gently with a scrungie or a clean washcloth.   5. Apply the CHG Soap to your body ONLY FROM THE NECK DOWN.  Do not use on open wounds or open sores. Avoid contact with your eyes, ears, mouth and genitals (private parts). Wash Face and genitals (private parts)  with your normal soap.  6. Wash thoroughly, paying special attention to the area where your surgery will be performed.  7. Thoroughly rinse your body with warm water from the neck down.  8. DO NOT shower/wash with your normal soap after using and rinsing off the CHG Soap.  9. Pat yourself  dry with a CLEAN TOWEL.  10. Wear CLEAN PAJAMAS to bed the night before surgery, wear comfortable clothes the morning of surgery  11. Place CLEAN SHEETS on your bed the night of your first shower and DO NOT SLEEP WITH PETS.  Day of Surgery:  Do not apply any deodorants/lotions.  Please wear clean clothes to the hospital/surgery center.    Remember to brush your teeth WITH YOUR REGULAR TOOTHPASTE.    Please read over the following fact sheets that you were given.

## 2018-10-10 NOTE — Anesthesia Preprocedure Evaluation (Deleted)
Anesthesia Evaluation  Patient identified by MRN, date of birth, ID band Patient awake    Reviewed: Allergy & Precautions, H&P , NPO status , Patient's Chart, lab work & pertinent test results  Airway        Dental   Pulmonary former smoker,           Cardiovascular hypertension, Pt. on medications + CAD, + CABG and + Peripheral Vascular Disease       Neuro/Psych negative psych ROS   GI/Hepatic negative GI ROS, Neg liver ROS,   Endo/Other  negative endocrine ROS  Renal/GU negative Renal ROS     Musculoskeletal   Abdominal   Peds  Hematology negative hematology ROS (+)   Anesthesia Other Findings  Transthoracic Echocardiography  04/10/2018 Indications:      Thoracic Aortic Aneurysm (I71.2). Study Conclusions  Impressions:  - Normal LV systolic function; mild LVH; mild diastolic   dysfunction; moderately dilated aortic root (4.6 cm); suggest CTA   or MRA to further assess; mild LAE; atrial septal aneurysm.      Reproductive/Obstetrics                                                          Anesthesia Evaluation  Patient identified by MRN, date of birth, ID band Patient awake    Reviewed: Allergy & Precautions, NPO status , Patient's Chart, lab work & pertinent test results  History of Anesthesia Complications Negative for: history of anesthetic complications  Airway Mallampati: II  TM Distance: >3 FB Neck ROM: Full  Mouth opening: Limited Mouth Opening  Dental no notable dental hx. (+) Dental Advisory Given   Pulmonary former smoker,    Pulmonary exam normal breath sounds clear to auscultation       Cardiovascular hypertension, Pt. on medications + CAD, + Past MI, + CABG and + Peripheral Vascular Disease  Normal cardiovascular exam Rhythm:Regular Rate:Normal  Echo is satisfactory. The aortic root is 4.4 and the ascending aorta is 4.5 which is similar to  previous study. 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   Abdominal   Peds negative pediatric ROS (+)  Hematology negative hematology ROS (+)   Anesthesia Other Findings   Reproductive/Obstetrics negative OB ROS                           Anesthesia Physical  Anesthesia Plan  ASA: III  Anesthesia Plan: Spinal   Post-op Pain Management:    Induction: Intravenous  Airway Management Planned: Natural Airway  Additional Equipment:   Intra-op Plan:   Post-operative Plan:   Informed Consent: I have reviewed the patients History and Physical, chart, labs and discussed the procedure including the risks, benefits and alternatives for the proposed anesthesia with the patient or authorized representative who has indicated his/her understanding and acceptance.   Dental advisory given  Plan Discussed  with: CRNA, Surgeon and Anesthesiologist  Anesthesia Plan Comments:        Anesthesia Quick Evaluation  Anesthesia Physical Anesthesia Plan  ASA: III  Anesthesia Plan: General   Post-op Pain Management:    Induction:   PONV Risk Score and Plan: 3 and Ondansetron and Dexamethasone  Airway Management Planned: LMA  Additional Equipment:   Intra-op Plan:   Post-operative Plan:   Informed Consent: I have reviewed the patients History and Physical, chart, labs and discussed the procedure including the risks, benefits and alternatives for the proposed anesthesia with the patient or authorized representative who has indicated his/her  understanding and acceptance.     Dental advisory given  Plan Discussed with: CRNA  Anesthesia Plan Comments: (See PAT note by Karoline Caldwell, PA-C )      Anesthesia Quick Evaluation

## 2018-10-10 NOTE — Progress Notes (Signed)
Anesthesia Chart Review:  Case:  [id number] Date/Time:  10/16/18 1230   Procedure:  ULNAR NERVE DECOMPRESSION/TRANSPOSITION (Left )   Anesthesia type:  Choice   Pre-op diagnosis:  LEFT ELBOW CUBITAL TUNNEL SYNDROME   Location:  [facility] OR ROOM 07 / [facility]   Surgeon:  [name], MD      DISCUSSION: 78 yo male former smoker. Pertinent hx includes CVA (2007), CAD (CABG in 1991, BMS to the SVG in 2008), Aneurysm of thoracic aorta.  Most recent echo 04/10/2018 showed Normal LV systolic function; mild LVH; mild diastolic dysfunction; moderately dilated aortic root (4.6 cm); suggest CTA or MRA to further assess; mild LAE; atrial septal aneurysm. Jory Sims, NP commented on result stating "His aorta has increased from 4.5 to 4.6 cm. Will continue current regimen. Repeat echo in one year."  Pt seen 09/30/2018 by Jory Sims, NP for preop clearance. Per her note: "Chart reviewed as part of pre-operative protocol coverage. Given past medical history and time since last visit, based on ACC/AHA guidelines, [place] would be at acceptable risk for the planned procedure without further cardiovascular testing." She also increased AntiHTN meds as pt home log showed elevated readings.  Anticipate he can proceed as planned barring acute status change.  VS: BP (!) 165/89   Pulse 89   Temp 36.7 C   Resp 20   Ht 5\' 9"  (1.753 m)   Wt 80.7 kg   SpO2 98%   BMI 26.29 kg/m   PROVIDERS: Shirline Frees, MD is PCP  Shelva Majestic, MD is Cardiologist  LABS: Labs reviewed: Acceptable for surgery. (all labs ordered are listed, but only abnormal results are displayed)  Labs Reviewed  BASIC METABOLIC PANEL - Abnormal; Notable for the following components:      Result Value   BUN 24 (*)    All other components within normal limits  CBC     IMAGES: CT Chest 01/21/2017: IMPRESSION: Previously seen subpleural thickening at the left base not visualized on today's study. There is a 4 mm left  lower lobe pulmonary nodule which is stable since prior study. No follow-up needed if patient is low-risk. Non-contrast chest CT can be considered in 12 months if patient is high-risk. This recommendation follows the consensus statement: Guidelines for Management of Incidental Pulmonary Nodules Detected on CT Images: From the Fleischner Society 2017; Radiology 2017; 284:228-243.   EKG: 09/30/2018: Sinus rhythm with premature atrial complexes in a pattern of bigeminy.  Rate 65.  Inferior infarct, age undetermined.  CV: TTE 04/10/2018: Study Conclusions  - Left ventricle: The cavity size was normal. Wall thickness was   increased in a pattern of mild LVH. Systolic function was normal.   The estimated ejection fraction was in the range of 55% to 60%.   Wall motion was normal; there were no regional wall motion   abnormalities. Doppler parameters are consistent with abnormal   left ventricular relaxation (grade 1 diastolic dysfunction). - Aortic root: The aortic root was moderately dilated. - Mitral valve: Calcified annulus. - Left atrium: The atrium was mildly dilated. - Atrial septum: There was an atrial septal aneurysm.  Impressions:  - Normal LV systolic function; mild LVH; mild diastolic   dysfunction; moderately dilated aortic root (4.6 cm); suggest CTA   or MRA to further assess; mild LAE; atrial septal aneurysm.  Past Medical History:  Diagnosis Date  . Abscess of gallbladder   . 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  Basal cell carcinoma of skin   . Broken jaw ([place])    right lower   . Cholecystitis   . Coronary heart disease    has a stent  . CVA (cerebral infarction) 2007   "mild"  . Fatigue 07/26/2011  . History of cancer chemotherapy   . History of radiation therapy 06/18/2011- 08/03/2011   squamous cell tongue/69.96 Gy 71fx  . Hypercholesteremia    under control  . Myocardial infarction ([place])    approx 2008 or 2009  . Numbness    right lower    . Numbness and tingling in left hand    numbess 2 fingers   . 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 Peripheral vascular disease ([place])   . PFO (patent foramen ovale)   . Prostate cancer ([place]) 2002  . Skin cancer    basal cell, squamous cell  . Squamous cell carcinoma of skin   . Stroke [facility])    appro in 08 or 09    Past Surgical History:  Procedure Laterality Date  . CHOLECYSTECTOMY  04/2009   3 surgeries involved: lap chole x2 laparotomy  . CORONARY ARTERY BYPASS GRAFT  1991   triple  . ELBOW SURGERY  42 years ago   removal left radial head due to hockey accident  . EXPLORATORY LAPAROTOMY  04/2010   Dr. Redmond Pulling (3rd surgery for gall bladder)  . INGUINAL HERNIA REPAIR Right 09/23/2014   Procedure: RIGHT INGUINAL HERNIA REPAIR WITH MESH;  Surgeon: Jackolyn Confer, MD;  Location: [facility];  Service: General;  Laterality: Right;  . INGUINAL HERNIA REPAIR Left 09/30/2015   Procedure: HERNIA REPAIR LEFT INGUINAL ADULT;  Surgeon: Jackolyn Confer, MD;  Location: [facility];  Service: General;  Laterality: Left;  . INSERTION OF MESH Left 09/30/2015   Procedure: INSERTION OF MESH;  Surgeon: Jackolyn Confer, MD;  Location: [facility];  Service: General;  Laterality: Left;  Marland Kitchen MULTIPLE TOOTH EXTRACTIONS     prior to radiation  . PEG TUBE PLACEMENT  06/13/11 at [facility] IR   10/2011 removed  . PROSTATECTOMY  2002  . TONSILLECTOMY     x2: as child then 1985 for Left lingual tonsil removed  . TOTAL HIP ARTHROPLASTY Left 02/08/2016   Procedure: TOTAL HIP ARTHROPLASTY ANTERIOR APPROACH;  Surgeon: Frederik Pear, MD;  Location: [place];  Service: Orthopedics;  Laterality: Left;    MEDICATIONS: . amLODipine (NORVASC) 5 MG tablet  . aspirin EC 81 MG tablet  . atorvastatin (LIPITOR) 40 MG tablet  . B Complex-Biotin-FA (VITAMIN B50 COMPLEX PO)  . calcium carbonate (OS-CAL) 600 MG TABS  . cholecalciferol (VITAMIN D) 1000 UNITS tablet  . fish oil-omega-3 fatty acids 1000 MG capsule  . fluorouracil (EFUDEX) 5 %  cream  . Hyaluronic Acid-Vitamin C (HYALURONIC ACID PO)  . Lecithin 1200 MG CAPS  . Misc Natural Products (OSTEO BI-FLEX JOINT SHIELD PO)  . Multiple Vitamin (MULTIVITAMIN WITH MINERALS) TABS tablet  . mupirocin ointment (BACTROBAN) 2 %  . OVER THE COUNTER MEDICATION  . pyridOXINE (VITAMIN B-6) 50 MG tablet  . tretinoin (RETIN-A) 0.025 % cream  . Turmeric 450 MG CAPS  . UBIQUINOL PO  . VITAMIN A PO  . vitamin C (ASCORBIC ACID) 500 MG tablet  . vitamin E 400 UNIT capsule  . Zinc 25 MG TABS   No current facility-administered medications for this encounter.    . topical emolient (BIAFINE) emulsion    Karoline Caldwell, PA-C [facility] Short Stay Center/Anesthesiology Phone [phone])  [phone] 10/10/2018 3:56 PM

## 2018-10-10 NOTE — Telephone Encounter (Signed)
LM2CB-TO CHECK HOW BP IS DOING WITH MEDICATION INCREASE. WILL CB LATER

## 2018-10-15 ENCOUNTER — Encounter (HOSPITAL_COMMUNITY): Payer: Self-pay | Admitting: Anesthesiology

## 2018-10-16 ENCOUNTER — Encounter (HOSPITAL_COMMUNITY): Payer: Self-pay

## 2018-10-16 ENCOUNTER — Ambulatory Visit (HOSPITAL_COMMUNITY): Payer: Medicare Other | Admitting: Anesthesiology

## 2018-10-16 ENCOUNTER — Encounter (HOSPITAL_COMMUNITY)
Admission: RE | Disposition: A | Discharge status: Home / Self Care | Payer: Self-pay | Source: Home / Self Care | Attending: Orthopedic Surgery

## 2018-10-16 ENCOUNTER — Ambulatory Visit (HOSPITAL_COMMUNITY): Payer: Medicare Other | Admitting: Physician Assistant

## 2018-10-16 ENCOUNTER — Ambulatory Visit (HOSPITAL_COMMUNITY)
Admission: RE | Admit: 2018-10-16 | Discharge: 2018-10-16 | Disposition: A | Discharge status: Home / Self Care | Payer: Medicare Other | Attending: Orthopedic Surgery | Admitting: Orthopedic Surgery

## 2018-10-16 DIAGNOSIS — Z79899 Other long term (current) drug therapy: Secondary | ICD-10-CM | POA: Diagnosis not present

## 2018-10-16 DIAGNOSIS — Z8673 Personal history of transient ischemic attack (TIA), and cerebral infarction without residual deficits: Secondary | ICD-10-CM | POA: Diagnosis not present

## 2018-10-16 DIAGNOSIS — Z85818 Personal history of malignant neoplasm of other sites of lip, oral cavity, and pharynx: Secondary | ICD-10-CM | POA: Insufficient documentation

## 2018-10-16 DIAGNOSIS — G5622 Lesion of ulnar nerve, left upper limb: Secondary | ICD-10-CM | POA: Diagnosis present

## 2018-10-16 DIAGNOSIS — I252 Old myocardial infarction: Secondary | ICD-10-CM | POA: Insufficient documentation

## 2018-10-16 DIAGNOSIS — E785 Hyperlipidemia, unspecified: Secondary | ICD-10-CM | POA: Diagnosis not present

## 2018-10-16 DIAGNOSIS — I739 Peripheral vascular disease, unspecified: Secondary | ICD-10-CM | POA: Insufficient documentation

## 2018-10-16 DIAGNOSIS — Z7982 Long term (current) use of aspirin: Secondary | ICD-10-CM | POA: Diagnosis not present

## 2018-10-16 DIAGNOSIS — Z9221 Personal history of antineoplastic chemotherapy: Secondary | ICD-10-CM | POA: Insufficient documentation

## 2018-10-16 DIAGNOSIS — Z923 Personal history of irradiation: Secondary | ICD-10-CM | POA: Diagnosis not present

## 2018-10-16 DIAGNOSIS — Z87891 Personal history of nicotine dependence: Secondary | ICD-10-CM | POA: Diagnosis not present

## 2018-10-16 DIAGNOSIS — I2510 Atherosclerotic heart disease of native coronary artery without angina pectoris: Secondary | ICD-10-CM | POA: Insufficient documentation

## 2018-10-16 DIAGNOSIS — Z8546 Personal history of malignant neoplasm of prostate: Secondary | ICD-10-CM | POA: Diagnosis not present

## 2018-10-16 DIAGNOSIS — Z951 Presence of aortocoronary bypass graft: Secondary | ICD-10-CM | POA: Diagnosis not present

## 2018-10-16 DIAGNOSIS — Z96642 Presence of left artificial hip joint: Secondary | ICD-10-CM | POA: Diagnosis not present

## 2018-10-16 DIAGNOSIS — Z85828 Personal history of other malignant neoplasm of skin: Secondary | ICD-10-CM | POA: Insufficient documentation

## 2018-10-16 DIAGNOSIS — Z9079 Acquired absence of other genital organ(s): Secondary | ICD-10-CM | POA: Diagnosis not present

## 2018-10-16 DIAGNOSIS — E7800 Pure hypercholesterolemia, unspecified: Secondary | ICD-10-CM | POA: Insufficient documentation

## 2018-10-16 DIAGNOSIS — I10 Essential (primary) hypertension: Secondary | ICD-10-CM | POA: Diagnosis not present

## 2018-10-16 HISTORY — PX: ULNAR NERVE TRANSPOSITION: SHX2595

## 2018-10-16 LAB — SURGICAL PCR SCREEN
MRSA, PCR: NEGATIVE
Staphylococcus aureus: NEGATIVE

## 2018-10-16 SURGERY — ULNAR NERVE DECOMPRESSION/TRANSPOSITION
Anesthesia: General | Laterality: Left

## 2018-10-16 MED ORDER — IBUPROFEN 200 MG PO TABS: 200.0000 mg | ORAL_TABLET | Freq: Four times a day (QID) | ORAL | Status: DC | PRN

## 2018-10-16 MED ORDER — EPHEDRINE SULFATE 50 MG/ML IJ SOLN
INTRAMUSCULAR | Status: DC | PRN
  Administered 2018-10-16: 10 mg via INTRAVENOUS
  Administered 2018-10-16: 5 mg via INTRAVENOUS
  Administered 2018-10-16 (×2): 10 mg via INTRAVENOUS

## 2018-10-16 MED ORDER — ONDANSETRON HCL 4 MG/2ML IJ SOLN
INTRAMUSCULAR | Status: AC
  Filled 2018-10-16: qty 2

## 2018-10-16 MED ORDER — FENTANYL CITRATE (PF) 100 MCG/2ML IJ SOLN
INTRAMUSCULAR | Status: AC
  Filled 2018-10-16: qty 2

## 2018-10-16 MED ORDER — ONDANSETRON HCL 4 MG/2ML IJ SOLN: 4.0000 mg | Freq: Once | INTRAMUSCULAR | Status: DC | PRN

## 2018-10-16 MED ORDER — ONDANSETRON HCL 4 MG/2ML IJ SOLN
INTRAMUSCULAR | Status: DC | PRN
  Administered 2018-10-16: 4 mg via INTRAVENOUS

## 2018-10-16 MED ORDER — LACTATED RINGERS IV SOLN
INTRAVENOUS | Status: DC
  Administered 2018-10-16 (×2): via INTRAVENOUS

## 2018-10-16 MED ORDER — CEFAZOLIN SODIUM-DEXTROSE 2-4 GM/100ML-% IV SOLN
2.0000 g | INTRAVENOUS | Status: DC
  Filled 2018-10-16: qty 100

## 2018-10-16 MED ORDER — CEFAZOLIN SODIUM-DEXTROSE 2-3 GM-%(50ML) IV SOLR
INTRAVENOUS | Status: DC | PRN
  Administered 2018-10-16: 2 g via INTRAVENOUS

## 2018-10-16 MED ORDER — MEPERIDINE HCL 50 MG/ML IJ SOLN: 6.2500 mg | INTRAMUSCULAR | Status: DC | PRN

## 2018-10-16 MED ORDER — SUCCINYLCHOLINE CHLORIDE 20 MG/ML IJ SOLN
INTRAMUSCULAR | Status: DC | PRN
  Administered 2018-10-16: 20 mg via INTRAVENOUS

## 2018-10-16 MED ORDER — FENTANYL CITRATE (PF) 100 MCG/2ML IJ SOLN: 25.0000 ug | INTRAMUSCULAR | Status: DC | PRN

## 2018-10-16 MED ORDER — BUPIVACAINE HCL (PF) 0.25 % IJ SOLN
INTRAMUSCULAR | Status: AC
  Filled 2018-10-16: qty 30

## 2018-10-16 MED ORDER — DEXAMETHASONE SODIUM PHOSPHATE 4 MG/ML IJ SOLN
INTRAMUSCULAR | Status: DC | PRN
  Administered 2018-10-16: 5 mg via INTRAVENOUS

## 2018-10-16 MED ORDER — FENTANYL CITRATE (PF) 100 MCG/2ML IJ SOLN
100.0000 ug | Freq: Once | INTRAMUSCULAR | Status: AC
  Administered 2018-10-16: 50 ug via INTRAVENOUS

## 2018-10-16 MED ORDER — LIDOCAINE 2% (20 MG/ML) 5 ML SYRINGE
INTRAMUSCULAR | Status: DC | PRN
  Administered 2018-10-16: 60 mg via INTRAVENOUS

## 2018-10-16 MED ORDER — FENTANYL CITRATE (PF) 250 MCG/5ML IJ SOLN
INTRAMUSCULAR | Status: AC
  Filled 2018-10-16: qty 5

## 2018-10-16 MED ORDER — DEXAMETHASONE SODIUM PHOSPHATE 10 MG/ML IJ SOLN
INTRAMUSCULAR | Status: AC
  Filled 2018-10-16: qty 1

## 2018-10-16 MED ORDER — MIDAZOLAM HCL 2 MG/2ML IJ SOLN
INTRAMUSCULAR | Status: AC
  Filled 2018-10-16: qty 2

## 2018-10-16 MED ORDER — CHLORHEXIDINE GLUCONATE 4 % EX LIQD: 60.0000 mL | Freq: Once | CUTANEOUS | Status: DC

## 2018-10-16 MED ORDER — OXYCODONE HCL 5 MG/5ML PO SOLN: 5.0000 mg | Freq: Once | ORAL | Status: DC | PRN

## 2018-10-16 MED ORDER — IBUPROFEN 100 MG/5ML PO SUSP: 200.0000 mg | Freq: Four times a day (QID) | ORAL | Status: DC | PRN

## 2018-10-16 MED ORDER — EPHEDRINE 5 MG/ML INJ
INTRAVENOUS | Status: AC
  Filled 2018-10-16: qty 10

## 2018-10-16 MED ORDER — PROPOFOL 10 MG/ML IV BOLUS
INTRAVENOUS | Status: DC | PRN
  Administered 2018-10-16: 200 mg via INTRAVENOUS
  Administered 2018-10-16 (×2): 50 mg via INTRAVENOUS

## 2018-10-16 MED ORDER — 0.9 % SODIUM CHLORIDE (POUR BTL) OPTIME
TOPICAL | Status: DC | PRN
  Administered 2018-10-16: 1000 mL

## 2018-10-16 MED ORDER — OXYCODONE HCL 5 MG PO TABS: 5.0000 mg | ORAL_TABLET | Freq: Once | ORAL | Status: DC | PRN

## 2018-10-16 MED ORDER — HYDROCODONE-ACETAMINOPHEN 5-325 MG PO TABS: 1.0000 | ORAL_TABLET | Freq: Four times a day (QID) | ORAL | 0 refills | Status: DC | PRN

## 2018-10-16 MED ORDER — MIDAZOLAM HCL 2 MG/2ML IJ SOLN: 2.0000 mg | Freq: Once | INTRAMUSCULAR | Status: DC

## 2018-10-16 MED ORDER — LIDOCAINE 2% (20 MG/ML) 5 ML SYRINGE
INTRAMUSCULAR | Status: AC
  Filled 2018-10-16: qty 5

## 2018-10-16 MED ORDER — BUPIVACAINE HCL 0.25 % IJ SOLN
INTRAMUSCULAR | Status: DC | PRN
  Administered 2018-10-16: 10 mL

## 2018-10-16 MED ORDER — VANCOMYCIN HCL IN DEXTROSE 1-5 GM/200ML-% IV SOLN
1000.0000 mg | INTRAVENOUS | Status: AC
  Administered 2018-10-16: 1000 mg via INTRAVENOUS
  Filled 2018-10-16: qty 200

## 2018-10-16 SURGICAL SUPPLY — 36 items
BANDAGE ELASTIC 4 VELCRO ST LF (GAUZE/BANDAGES/DRESSINGS) ×1 IMPLANT
BNDG COHESIVE 4X5 TAN STRL (GAUZE/BANDAGES/DRESSINGS) ×2 IMPLANT
COVER SURGICAL LIGHT HANDLE (MISCELLANEOUS) ×2 IMPLANT
DRAPE U-SHAPE 47X51 STRL (DRAPES) ×2 IMPLANT
ELECT REM PT RETURN 9FT ADLT (ELECTROSURGICAL)
ELECTRODE REM PT RTRN 9FT ADLT (ELECTROSURGICAL) IMPLANT
GAUZE SPONGE 4X4 12PLY STRL LF (GAUZE/BANDAGES/DRESSINGS) ×1 IMPLANT
GLOVE BIO SURGEON STRL SZ7 (GLOVE) ×2 IMPLANT
GLOVE BIO SURGEON STRL SZ7.5 (GLOVE) ×2 IMPLANT
GLOVE BIOGEL PI IND STRL 8 (GLOVE) ×1 IMPLANT
GLOVE BIOGEL PI INDICATOR 8 (GLOVE) ×1
GOWN STRL REUS W/ TWL LRG LVL3 (GOWN DISPOSABLE) ×4 IMPLANT
GOWN STRL REUS W/ TWL XL LVL3 (GOWN DISPOSABLE) ×1 IMPLANT
GOWN STRL REUS W/TWL LRG LVL3 (GOWN DISPOSABLE) ×8
GOWN STRL REUS W/TWL XL LVL3 (GOWN DISPOSABLE) ×2
KIT BASIN OR (CUSTOM PROCEDURE TRAY) ×2 IMPLANT
KIT TURNOVER KIT B (KITS) ×2 IMPLANT
MANIFOLD NEPTUNE II (INSTRUMENTS) ×2 IMPLANT
NDL KEITH (NEEDLE) ×1 IMPLANT
NEEDLE KEITH (NEEDLE) ×2 IMPLANT
NS IRRIG 1000ML POUR BTL (IV SOLUTION) ×2 IMPLANT
PACK ORTHO EXTREMITY (CUSTOM PROCEDURE TRAY) ×2 IMPLANT
PAD ARMBOARD 7.5X6 YLW CONV (MISCELLANEOUS) ×4 IMPLANT
PADDING CAST SYNTHETIC 4 (CAST SUPPLIES) ×1
PADDING CAST SYNTHETIC 4X4 STR (CAST SUPPLIES) IMPLANT
STOCKINETTE IMPERVIOUS 9X36 MD (GAUZE/BANDAGES/DRESSINGS) ×2 IMPLANT
STRIP CLOSURE SKIN 1/2X4 (GAUZE/BANDAGES/DRESSINGS) ×3 IMPLANT
SUCTION FRAZIER HANDLE 10FR (MISCELLANEOUS) ×1
SUCTION TUBE FRAZIER 10FR DISP (MISCELLANEOUS) ×1 IMPLANT
SUT FIBERWIRE #2 38 T-5 BLUE (SUTURE)
SUT MNCRL AB 4-0 PS2 18 (SUTURE) ×2 IMPLANT
SUT VIC AB 2-0 FS1 27 (SUTURE) ×2 IMPLANT
SUTURE FIBERWR #2 38 T-5 BLUE (SUTURE) IMPLANT
SYR CONTROL 10ML LL (SYRINGE) ×1 IMPLANT
TAPE STRIPS DRAPE STRL (GAUZE/BANDAGES/DRESSINGS) ×1 IMPLANT
TUBE CONNECTING 12X1/4 (SUCTIONS) ×2 IMPLANT

## 2018-10-16 NOTE — Op Note (Signed)
Procedure(s): ULNAR NERVE DECOMPRESSION Procedure Note  Jason Noble male 78 y.o. 10/16/2018  Preoperative diagnosis: Left cubital tunnel syndrome  Postoperative diagnosis: Same  Procedure(s) and Anesthesia Type:    *Left ULNAR NERVE DECOMPRESSION - General      Surgeon: Isabella Stalling   Assistants: Joanell Rising, PA-C Randall Hiss was scrubbed and present throughout the procedure and was essential for retraction positioning and closure.)  Anesthesia: General endotracheal anesthesia    Procedure Detail  ULNAR NERVE DECOMPRESSION Estimated Blood Loss:  Minimal         Drains: none  Blood Given: none         Specimens: none        Complications:  * No complications entered in OR log *         Disposition: PACU - hemodynamically stable.         Condition: stable    Procedure:   INDICATIONS FOR SURGERY: The patient has a history of EMG documented cubital tunnel syndrome which has been worsening over the past several years and is now with constant symptoms.  Indicated for surgical release to try and prevent progression and hopefully alleviate  OPERATIVE FINDINGS: The ulnar nerve appeared healthy although it was tight in the cubital tunnel.  This area was completely released and there was no adhesions proximally or distally.  DESCRIPTION OF PROCEDURE: The patient was identified in preoperative  holding area where I personally marked the operative site after  verifying site, side, and procedure with the patient. The patient was taken back  to the operating room where general anesthesia was induced without  Complication. The patient did receive preoperative IV antibiotics. The patient was kept in the supine position. The operative extremity was prepped and draped in standard sterile fashion.  A nonsterile tourniquet was used.  The limb was exsanguinated and the tourniquet was elevated to 250 mmHg.  A approximately 5 cm incision was made over the posterior medial elbow  just posterior to the palpable medial epicondyle.  The nerve was identified proximally and carefully exposed along its course.  The fascia over the cubital tunnel was divided and was noted to be fairly thick at this level.  The dissection was carried distally over the fascia of the FCU.  The nerve was freed up proximally and distally for about at least 4 cm in each direction.  With elbow flexion there was no tendency towards subluxation and I did not feel that transposition was indicated here.  The wound was copiously irrigated with normal saline and subsequently closed in layers.  The wound is then closed with 3-0 Vicryl in a deep dermal layer and 4-0 Monocryl for skin closure. Steri-Strips were applied Patient was allowed to awaken from general anesthesia transferred to stretcher and taken to the recovery room in stable condition.   POSTOPERATIVE PLAN: The patient will be discharged home today with family.  He will followup in about 10 to 14 days.

## 2018-10-16 NOTE — Discharge Instructions (Addendum)
Cubital Tunnel Syndrome  Cubital tunnel syndrome is a condition that causes pain and weakness of the forearm and hand. It happens when one of the nerves that runs along the inside of the elbow joint (ulnar nerve) becomes irritated. This condition is usually caused by repeated arm motions that are done during sports or work-related activities. What are the causes? This condition may be caused by:  Increased pressure on the ulnar nerve at the elbow, arm, or forearm. This can result from: ? Irritation caused by repeated elbow bending. ? Poorly healed elbow fractures. ? Tumors in the elbow. These are usually noncancerous (benign). ? Scar tissue that develops in the elbow after an injury. ? Bony growths (spurs) near the ulnar nerve.  Stretching of the nerve due to loose elbow ligaments.  Trauma to the nerve at the elbow. What increases the risk? The following factors may make you more likely to develop this condition:  Doing manual labor that requires frequent bending of the elbow.  Playing sports that include repeated or strenuous throwing motions, such as baseball.  Playing contact sports, such as football or lacrosse.  Not warming up properly before activities.  Having diabetes.  Having an underactive thyroid (hypothyroidism). What are the signs or symptoms? Symptoms of this condition include:  Clumsiness or weakness of the hand.  Tenderness of the inner elbow.  Aching or soreness of the inner elbow, forearm, or fingers, especially the little finger or the ring finger.  Increased pain when forcing the elbow to bend.  Reduced control when throwing objects.  Tingling, numbness, or a burning feeling inside the forearm or in part of the hand or fingers, especially the little finger or the ring finger.  Sharp pains that shoot from the elbow down to the wrist and hand.  The inability to grip or pinch hard. How is this diagnosed? This condition is diagnosed based on:  Your  symptoms and medical history. Your health care provider will also ask for details about any injury.  A physical exam. You may also have tests, including:  Electromyogram (EMG). This test measures electrical signals sent by your nerves into the muscles.  Nerve conduction study. This test measures how well electrical signals pass through your nerves.  Imaging tests, such as X-rays, ultrasound, and MRI. These tests check for possible causes of your condition. How is this treated? This condition may be treated by:  Stopping the activities that are causing your symptoms to get worse.  Icing and taking medicines to reduce pain and swelling.  Wearing a splint to prevent your elbow from bending, or wearing an elbow pad where the ulnar nerve is closest to the skin.  Working with a physical therapist in less severe cases. This may help to: ? Decrease your symptoms. ? Improve the strength and range of motion of your elbow, forearm, and hand. If these treatments do not help, surgery may be needed. Follow these instructions at home: If you have a splint:  Wear the splint as told by your health care provider. Remove it only as told by your health care provider.  Loosen the splint if your fingers tingle, become numb, or turn cold and blue.  Keep the splint clean.  If the splint is not waterproof: ? Do not let it get wet. ? Cover it with a watertight covering when you take a bath or shower. Managing pain, stiffness, and swelling   If directed, put ice on the injured area: ? Put ice in a plastic bag. ?   Place a towel between your skin and the bag. ? Leave the ice on for 20 minutes, 2-3 times a day.  Move your fingers often to avoid stiffness and to lessen swelling.  Raise (elevate) the injured area above the level of your heart while you are sitting or lying down. General instructions  Take over-the-counter and prescription medicines only as told by your health care provider.  Do any  exercise or physical therapy as told by your health care provider.  Do not drive or use heavy machinery while taking prescription pain medicine.  If you were given an elbow pad, wear it as told by your health care provider.  Keep all follow-up visits as told by your health care provider. This is important. Contact a health care provider if:  Your symptoms get worse.  Your symptoms do not get better with treatment.  You have new pain.  Your hand on the injured side feels numb or cold. Summary  Cubital tunnel syndrome is a condition that causes pain and weakness of the forearm and hand.  You are more likely to develop this condition if you do work or play sports that involve repeated arm movements.  This condition is often treated by stopping repetitive activities, applying ice, and using anti-inflammatory medicines.  In rare cases, surgery may be needed. This information is not intended to replace advice given to you by your health care provider. Make sure you discuss any questions you have with your health care provider. Document Released: 09/03/2005 Document Revised: 01/20/2018 Document Reviewed: 01/20/2018 Elsevier Interactive Patient Education  2019 [employer]  

## 2018-10-16 NOTE — Anesthesia Procedure Notes (Signed)
Procedure Name: LMA Insertion Date/Time: 10/16/2018 1:50 PM Performed by: Candis Shine, CRNA Pre-anesthesia Checklist: Patient identified, Emergency Drugs available, Suction available and Patient being monitored Patient Re-evaluated:Patient Re-evaluated prior to induction Oxygen Delivery Method: Circle System Utilized Preoxygenation: Pre-oxygenation with 100% oxygen Induction Type: IV induction Ventilation: Mask ventilation without difficulty LMA: LMA inserted LMA Size: 4.0 Number of attempts: 1 Placement Confirmation: positive ETCO2 Tube secured with: Tape Dental Injury: Teeth and Oropharynx as per pre-operative assessment

## 2018-10-16 NOTE — H&P (Signed)
Jason Noble is an 78 y.o. male.   Chief Complaint: Left hand numbness HPI: 78 year old gentleman with progressive cubital tunnel symptoms, EMG confirmed, failed conservative management.  Past Medical History:  Diagnosis Date  . Abscess of gallbladder   . 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 Basal cell carcinoma of skin   . Broken jaw ([place])    right lower   . Cholecystitis   . Coronary heart disease    has a stent  . CVA (cerebral infarction) 2007   "mild"  . Fatigue 07/26/2011  . History of cancer chemotherapy   . History of radiation therapy 06/18/2011- 08/03/2011   squamous cell tongue/69.96 Gy 52fx  . Hypercholesteremia    under control  . Myocardial infarction ([place])    approx 2008 or 2009  . Numbness    right lower   . Numbness and tingling in left hand    numbess 2 fingers   . 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 Peripheral vascular disease ([place])   . PFO (patent foramen ovale)   . Prostate cancer ([place]) 2002  . Skin cancer    basal cell, squamous cell  . Squamous cell carcinoma of skin   . Stroke [facility])    appro in 08 or 09    Past Surgical History:  Procedure Laterality Date  . CHOLECYSTECTOMY  04/2009   3 surgeries involved: lap chole x2 laparotomy  . CORONARY ARTERY BYPASS GRAFT  1991   triple  . ELBOW SURGERY  42 years ago   removal left radial head due to hockey accident  . EXPLORATORY LAPAROTOMY  04/2010   Dr. Redmond Pulling (3rd surgery for gall bladder)  . INGUINAL HERNIA REPAIR Right 09/23/2014   Procedure: RIGHT INGUINAL HERNIA REPAIR WITH MESH;  Surgeon: Jackolyn Confer, MD;  Location: [facility];  Service: General;  Laterality: Right;  . INGUINAL HERNIA REPAIR Left 09/30/2015   Procedure: HERNIA REPAIR LEFT INGUINAL ADULT;  Surgeon: Jackolyn Confer, MD;  Location: [facility];  Service: General;  Laterality: Left;  . INSERTION OF MESH Left 09/30/2015   Procedure: INSERTION OF MESH;  Surgeon: Jackolyn Confer, MD;  Location: [facility];  Service:  General;  Laterality: Left;  Marland Kitchen MULTIPLE TOOTH EXTRACTIONS     prior to radiation  . PEG TUBE PLACEMENT  06/13/11 at [facility]   10/2011 removed  . PROSTATECTOMY  2002  . TONSILLECTOMY     x2: as child then 1985 for Left lingual tonsil removed  . TOTAL HIP ARTHROPLASTY Left 02/08/2016   Procedure: TOTAL HIP ARTHROPLASTY ANTERIOR APPROACH;  Surgeon: Frederik Pear, MD;  Location: [facility];  Service: Orthopedics;  Laterality: Left;    Family History  Problem Relation Age of Onset  . Stroke Mother   . Coronary artery disease Brother        had bypass   Social History:  reports that he quit smoking about 52 years ago. His smoking use included cigarettes. He has a 1.25 pack-year smoking history. He has never used smokeless tobacco. He reports that he does not drink alcohol or use drugs.  Allergies: No Known Allergies  Medications Prior to Admission  Medication Sig Dispense Refill  . amLODipine (NORVASC) 5 MG tablet Take 1 tablet (5 mg total) by mouth daily.    Marland Kitchen aspirin EC 81 MG tablet Take 1 tablet (81 mg total) by mouth daily.    Marland Kitchen atorvastatin (LIPITOR) 40 MG tablet Take 1 tablet (40  mg total) by mouth daily at 6 PM. (Patient taking differently: Take 40 mg by mouth See admin instructions. Take 40 mg by mouth 6 days weekly except Saturday) 90 tablet 3  . B Complex-Biotin-FA (VITAMIN B50 COMPLEX PO) Take 50 mg by mouth daily.    . calcium carbonate (OS-CAL) 600 MG TABS Take 1,200 mg by mouth every evening.     . cholecalciferol (VITAMIN D) 1000 UNITS tablet Take 1,000 Units by mouth daily.    . fish oil-omega-3 fatty acids 1000 MG capsule Take 1 g by mouth daily.     . fluorouracil (EFUDEX) 5 % cream Apply 1 application topically daily as needed (ear).    . Hyaluronic Acid-Vitamin C (HYALURONIC ACID PO) Take 100 mg by mouth daily.    . Lecithin 1200 MG CAPS Take 1,200 mg by mouth daily.     . Misc Natural Products (OSTEO BI-FLEX JOINT SHIELD PO) Take 100 mg by mouth every evening. Pt not sure of  dosage.    . Multiple Vitamin (MULTIVITAMIN WITH MINERALS) TABS tablet Take 1 tablet by mouth daily.    . mupirocin ointment (BACTROBAN) 2 % Apply 1 application topically 2 (two) times daily as needed (inner nose).     Marland Kitchen OVER THE COUNTER MEDICATION Take 20 mg by mouth daily. Lutigold    . pyridOXINE (VITAMIN B-6) 50 MG tablet Take 50 mg by mouth daily.    Marland Kitchen tretinoin (RETIN-A) 0.025 % cream Apply 1 application topically every morning.     . Turmeric 450 MG CAPS Take 450 mg by mouth daily.     Marland Kitchen UBIQUINOL PO Take 100 mg by mouth daily.     Marland Kitchen VITAMIN A PO Take 1 tablet by mouth daily.    . vitamin C (ASCORBIC ACID) 500 MG tablet Take 500 mg by mouth 2 (two) times daily.     . vitamin E 400 UNIT capsule Take 400 Units by mouth every morning. Reported on 11/30/2015    . Zinc 25 MG TABS Take 25 mg by mouth daily.      No results found for this or any previous visit (from the past 48 hour(s)). No results found.  Review of Systems  All other systems reviewed and are negative.   Blood pressure (!) 185/90, pulse 69, temperature 98 F (36.7 C), temperature source Oral, resp. rate 20, height 5\' 9"  (1.753 m), weight 80.7 kg, SpO2 97 %. Physical Exam  Constitutional: He is oriented to person, place, and time. He appears well-developed and well-nourished.  HENT:  Head: Atraumatic.  Eyes: EOM are normal.  Cardiovascular: Intact distal pulses.  Respiratory: Effort normal.  Musculoskeletal:     Comments: Left hand ulnar nerve paresthesias.  No significant weakness.  Neurological: He is alert and oriented to person, place, and time.  Skin: Skin is warm and dry.  Psychiatric: He has a normal mood and affect.     Assessment/Plan 78 year old gentleman with progressive cubital tunnel symptoms, EMG confirmed, failed conservative management. Plan left elbow ulnar nerve decompression versus transposition. Risks / benefits of surgery discussed Consent on chart  NPO for OR Preop  antibiotics   Isabella Stalling, MD 10/16/2018, 12:53 PM

## 2018-10-16 NOTE — Anesthesia Preprocedure Evaluation (Signed)
Anesthesia Evaluation  Patient identified by MRN, date of birth, ID band Patient awake    Reviewed: Allergy & Precautions, NPO status , Patient's Chart, lab work & pertinent test results  History of Anesthesia Complications Negative for: history of anesthetic complications  Airway Mallampati: II  TM Distance: >3 FB Neck ROM: Full  Mouth opening: Limited Mouth Opening  Dental no notable dental hx. (+) Dental Advisory Given   Pulmonary former smoker,    Pulmonary exam normal breath sounds clear to auscultation       Cardiovascular hypertension, Pt. on medications + CAD, + Past MI, + CABG and + Peripheral Vascular Disease  Normal cardiovascular exam Rhythm:Regular Rate:Normal  Echo is satisfactory. The aortic root is 4.4 and the ascending aorta is 4.5 which is similar to previous study. 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   Abdominal   Peds negative pediatric ROS (+)  Hematology negative hematology ROS (+)   Anesthesia Other Findings   Reproductive/Obstetrics negative OB ROS                             Anesthesia Physical  Anesthesia Plan  ASA: III  Anesthesia Plan: Spinal   Post-op Pain Management:    Induction: Intravenous  PONV Risk Score and Plan:   Airway Management Planned: Natural Airway  Additional Equipment:   Intra-op Plan:   Post-operative Plan:   Informed Consent: I have reviewed the patients History and Physical, chart, labs and discussed the procedure including  the risks, benefits and alternatives for the proposed anesthesia with the patient or authorized representative who has indicated his/her understanding and acceptance.     Dental advisory given  Plan Discussed with: CRNA, Surgeon and Anesthesiologist  Anesthesia Plan Comments:         Anesthesia Quick Evaluation

## 2018-10-16 NOTE — Anesthesia Postprocedure Evaluation (Signed)
Anesthesia Post Note  Patient: Jason Noble  Procedure(s) Performed: ULNAR NERVE DECOMPRESSION/TRANSPOSITION (Left )     Patient location during evaluation: PACU Anesthesia Type: General Level of consciousness: awake and alert Pain management: pain level controlled Vital Signs Assessment: post-procedure vital signs reviewed and stable Respiratory status: spontaneous breathing, nonlabored ventilation and respiratory function stable Cardiovascular status: blood pressure returned to baseline and stable Postop Assessment: no apparent nausea or vomiting Anesthetic complications: no    Last Vitals:  Vitals:   10/16/18 1510 10/16/18 1525  BP: (!) 168/87 (!) 162/89  Pulse: 73 71  Resp: 11 18  Temp: (!) 36.3 C   SpO2: 92% 97%    Last Pain:  Vitals:   10/16/18 1510  TempSrc:   PainSc: 0-No pain                 FITZGERALD,W. EDMOND

## 2018-10-16 NOTE — Transfer of Care (Signed)
Immediate Anesthesia Transfer of Care Note  Patient: Jason Noble  Procedure(s) Performed: ULNAR NERVE DECOMPRESSION/TRANSPOSITION (Left )  Patient Location: PACU  Anesthesia Type:General  Level of Consciousness: awake, alert  and oriented  Airway & Oxygen Therapy: Patient Spontanous Breathing and Patient connected to nasal cannula oxygen  Post-op Assessment: Report given to RN and Post -op Vital signs reviewed and stable  Post vital signs: Reviewed and stable  Last Vitals:  Vitals Value Taken Time  BP 148/79 10/16/2018  2:40 PM  Temp 36.4 C 10/16/2018  2:40 PM  Pulse 40 10/16/2018  2:42 PM  Resp 20 10/16/2018  2:42 PM  SpO2 100 % 10/16/2018  2:42 PM  Vitals shown include unvalidated device data.  Last Pain:  Vitals:   10/16/18 1112  TempSrc: Oral  PainSc:       Patients Stated Pain Goal: 6 (32/44/01 0272)  Complications: No apparent anesthesia complications

## 2018-10-17 ENCOUNTER — Encounter (HOSPITAL_COMMUNITY): Payer: Self-pay | Admitting: Orthopedic Surgery

## 2018-11-07 ENCOUNTER — Telehealth: Payer: Self-pay | Admitting: Cardiovascular Disease

## 2018-11-07 NOTE — Telephone Encounter (Signed)
Pt calling stating that the medication Amlodipine 5 mg tablet is not working for him and he needed a different medication or an increase on the current medication. Pt would like a call back from the nurse concerning this matter, [phone]. Please address

## 2018-11-09 NOTE — Telephone Encounter (Signed)
We need to know what his BP recordings are at home before I increase amlodipine. Can potentially increase the dose or add something more.  Please give him a call back to discuss this.

## 2018-11-10 NOTE — Telephone Encounter (Signed)
LM2CB 

## 2018-11-11 NOTE — Telephone Encounter (Signed)
LM2CB LEFT DETAILED MESSAGE STATING THAT WE NEED CURRENT BP READINGS BEFORE WE CAN INCREASE MED

## 2018-11-13 NOTE — Telephone Encounter (Signed)
Called patient, advised that I would send these readings over to PharmD.

## 2018-11-13 NOTE — Telephone Encounter (Signed)
° °  Patient calling to report BP and HR.  Patient wants to discuss medications.  Please call patient   Feb 24 135/86/97 128/82/77 150/92/71  Feb 23 127/83/144 139/92/82 137/80/73 144/80/75  Feb 22 121/72/73 151/83/71  Feb 21 99/62/75 92/60/81 114/74/77  Feb 20 122/77/76 135/77/72  Feb 19 147/93/71 144/96/81 146/84/78 159/101/70

## 2018-11-13 NOTE — Telephone Encounter (Signed)
No CB from pt we will assume nothing further is needed

## 2018-11-13 NOTE — Telephone Encounter (Signed)
Attempted to contact patient.  Message goes straight to voicemail. Will route back to triage to make sure this is monitored.  Thanks!

## 2018-11-13 NOTE — Telephone Encounter (Signed)
Blood pressure looks much better, although not completely to goal.  Have patient keep in mind that when you average several days readings, it doesn't look too bad.  We can add valsartan 80 mg once daily, repeat BMET in 10-14 days and have patient follow up with [facility] or Jory Sims in 3-4 weeks.

## 2018-11-14 NOTE — Telephone Encounter (Signed)
LMTCB

## 2018-11-17 ENCOUNTER — Telehealth: Payer: Self-pay | Admitting: Adult Health

## 2018-11-17 DIAGNOSIS — I10 Essential (primary) hypertension: Secondary | ICD-10-CM

## 2018-11-17 DIAGNOSIS — Z79899 Other long term (current) drug therapy: Secondary | ICD-10-CM

## 2018-11-17 NOTE — Telephone Encounter (Signed)
Left message to call back  

## 2018-11-17 NOTE — Telephone Encounter (Signed)
Documentation     [place], Casimiro Needle, RPH-CPP  Caprice Beaver, LPN 4 days ago     Blood pressure looks much better, although not completely to goal.  Have patient keep in mind that when you average several days readings, it doesn't look too bad.  We can add valsartan 80 mg once daily, repeat BMET in 10-14 days and have patient follow up with [facility] or Jory Sims in 3-4 weeks.        unable to reach patient - this is the response need to give  To patient --- left message to cal back

## 2018-11-17 NOTE — Telephone Encounter (Signed)
New Message   Pt c/o medication issue:  1. Name of Medication: Amlodipine  2. How are you currently taking this medication (dosage and times per day)? 5mg  once a day   3. Are you having a reaction (difficulty breathing--STAT)? N/A  4. What is your medication issue? Patient states the dosage was increased and it's still not bring his blood pressure down.

## 2018-11-18 ENCOUNTER — Other Ambulatory Visit: Payer: Self-pay | Admitting: *Deleted

## 2018-11-18 ENCOUNTER — Other Ambulatory Visit: Payer: Self-pay

## 2018-11-18 DIAGNOSIS — I10 Essential (primary) hypertension: Secondary | ICD-10-CM

## 2018-11-18 DIAGNOSIS — Z79899 Other long term (current) drug therapy: Secondary | ICD-10-CM

## 2018-11-18 MED ORDER — AMLODIPINE BESYLATE 5 MG PO TABS: 5.0000 mg | ORAL_TABLET | Freq: Every day | ORAL | 2 refills | Status: DC

## 2018-11-18 MED ORDER — VALSARTAN 80 MG PO TABS: 80.0000 mg | ORAL_TABLET | Freq: Every day | ORAL | 2 refills | Status: DC

## 2018-11-18 NOTE — Telephone Encounter (Signed)
Spoke to patient.  Instruction given to start valsartan 80 mg daily.  BMP will be done between 3/18- 20/20. APPOINTMENT SCHEDULE 4/8/ WITH Purcell Nails, DNP  instruction given for coming to office for labs and office appointment,   patient verbalized understanding.

## 2018-11-21 ENCOUNTER — Telehealth: Payer: Self-pay | Admitting: Cardiovascular Disease

## 2018-11-21 NOTE — Telephone Encounter (Signed)
Left message to call back  

## 2018-11-21 NOTE — Telephone Encounter (Signed)
New Message   Pt c/o medication issue:  1. Name of Medication: valsartan (DIOVAN) 80 MG tablet   2. How are you currently taking this medication (dosage and times per day)?  Take 1 tablet (80 mg total) by mouth daily.          3. Are you having a reaction (difficulty breathing--STAT)?   4. What is your medication issue? Patient is calling in because [facility] is advising that the medication is on backorder. He would like to know if there is an alternative

## 2018-11-24 MED ORDER — LOSARTAN POTASSIUM 50 MG PO TABS: 50.0000 mg | ORAL_TABLET | Freq: Every day | ORAL | 3 refills | Status: DC

## 2018-11-24 NOTE — Telephone Encounter (Signed)
Received a call from patient.He stated Valsartan too expensive.Stated he can get Losartan at a better price.Advised I will send message to our pharmacist for a order.

## 2018-11-24 NOTE — Telephone Encounter (Signed)
Returned call to patient left Kristin's recommendation on personal voice mail.Losartan prescription sent to [facility].

## 2018-11-24 NOTE — Telephone Encounter (Signed)
Have him try losartan 50 mg once daily.

## 2018-11-25 ENCOUNTER — Encounter: Payer: Self-pay | Admitting: Cardiovascular Disease

## 2018-11-25 NOTE — Telephone Encounter (Signed)
error 

## 2018-11-26 DIAGNOSIS — Z9889 Other specified postprocedural states: Secondary | ICD-10-CM | POA: Diagnosis not present

## 2018-12-03 DIAGNOSIS — I10 Essential (primary) hypertension: Secondary | ICD-10-CM | POA: Diagnosis not present

## 2018-12-03 DIAGNOSIS — Z79899 Other long term (current) drug therapy: Secondary | ICD-10-CM | POA: Diagnosis not present

## 2018-12-04 LAB — BASIC METABOLIC PANEL
BUN/Creatinine Ratio: 22 (ref 10–24)
BUN: 24 mg/dL (ref 8–27)
CO2: 24 mmol/L (ref 20–29)
Calcium: 9.1 mg/dL (ref 8.6–10.2)
Chloride: 105 mmol/L (ref 96–106)
Creatinine, Ser: 1.08 mg/dL (ref 0.76–1.27)
GFR calc Af Amer: 76 mL/min/{1.73_m2} (ref >59)
GFR calc non Af Amer: 66 mL/min/{1.73_m2} (ref >59)
Glucose: 119 mg/dL — ABNORMAL HIGH (ref 65–99)
Potassium: 4.8 mmol/L (ref 3.5–5.2)
Sodium: 142 mmol/L (ref 134–144)

## 2018-12-05 ENCOUNTER — Telehealth: Payer: Self-pay | Admitting: Cardiology

## 2018-12-05 NOTE — Telephone Encounter (Addendum)
Left message to call back   Verify medications and dosages   Blood pressure readings received and reviewed by Doreene Burke PA who recommended increasing Amlodipine to 5 mg daily based on Arnold Long DNP last visit. Noticed chart says taking Amlodipine 5 mg daily   Blood pressure readings placed in Dr Jacalyn Lefevre box

## 2018-12-05 NOTE — Telephone Encounter (Signed)
New Message         Patient is calling in today about his b/p medication. He states that he does not see change in his #'s and wanted to come in sooner. I explained to the patient that we are scheduling around April 13th because of the virus, patient will fax in his bp #'s to see if it is necessary to come in earlier than 04/08. Pls call and advise.

## 2018-12-08 NOTE — Telephone Encounter (Signed)
Lm to call back ./cy 

## 2018-12-09 NOTE — Telephone Encounter (Signed)
Left message for pt to call.

## 2018-12-10 NOTE — Telephone Encounter (Signed)
3rd attempt to call pt back and LM

## 2018-12-12 NOTE — Telephone Encounter (Signed)
Change amlodipine to 10 mg daily and follow BP Kirk Ruths

## 2018-12-12 NOTE — Telephone Encounter (Signed)
Instructions sent to patient via mychart

## 2018-12-12 NOTE — Telephone Encounter (Signed)
Left message for pt to call.

## 2018-12-12 NOTE — Telephone Encounter (Signed)
Spoke with pt, he is currently taking 5 mg of amlodipine since February. He has been on the losartan 50 mg for about 2 weeks now. bp 140-150/85-95. He has a follow up appointment 12-23-18 and is wondering if he should do anything different prior to that appointment regarding medications.Will forward for dr Stanford Breed review

## 2018-12-15 NOTE — Telephone Encounter (Signed)
Left message for patient to check my chart messages and to call with questions.

## 2018-12-18 ENCOUNTER — Telehealth: Payer: Self-pay

## 2018-12-18 NOTE — Telephone Encounter (Signed)
LM2CB TO DISCUSS APPT ON Wed  4-8 @1030am 

## 2018-12-19 NOTE — Telephone Encounter (Signed)
Follow up: ° ° °Patient returning call back °

## 2018-12-22 NOTE — Telephone Encounter (Signed)
Returned call to pt he states that he would like to have a telephone visit and he doe have BP, scale and email and mychart

## 2018-12-22 NOTE — Telephone Encounter (Signed)
New Message   Patient returning Jason Noble's call.

## 2018-12-22 NOTE — Progress Notes (Signed)
Virtual Visit via Telephone Note   This visit type was conducted due to national recommendations for restrictions regarding the COVID-19 Pandemic (e.g. social distancing) in an effort to limit this patient's exposure and mitigate transmission in our community.  Due to his co-morbid illnesses, this patient is at least at moderate risk for complications without adequate follow up.  This format is felt to be most appropriate for this patient at this time.  The patient did not have access to video technology/had technical difficulties with video requiring transitioning to audio format only (telephone).  All issues noted in this document were discussed and addressed.  No physical exam could be performed with this format.  Please refer to the patient's chart for his  consent to telehealth for [facility].   Evaluation Performed:  Follow-up visit  This visit type was conducted due to national recommendations for restrictions regarding the COVID-19 Pandemic (e.g. social distancing).  This format is felt to be most appropriate for this patient at this time.  All issues noted in this document were discussed and addressed.  No physical exam was performed (except for noted visual exam findings with Telehealth visits).  See MyChart message from today for the patient's consent to telehealth for [facility].   Date:  12/24/2018   ID:  Jason Noble, DOB 31-Jan-1941, MRN [id number]  Patient Location:  [place] [address]   Provider location:   [place], [place] office   PCP:  Shirline Frees, MD  Cardiologist:  Kirk Ruths, MD  Electrophysiologist:  None   Chief Complaint:  Hypertension   History of Present Illness:    Jason Noble is a 78 y.o. male who presents via audio/video conferencing for a telehealth visit today for ongoing assessment and management of management of coronary artery disease, history of CABG in 1991, bare-metal stent to the SVG in 2008,  echocardiogram completed in April 2016 revealed normal LV systolic function with grade 1 diastolic dysfunction, mild left atrial enlargement and mild tricuspid regurg. Ascending aorta was noted to be measured at 4.5 cm. The patient was last seen in the office on 04/01/2017 by Dr. Stanford Breed. At that time, the patient was asymptomatic and continued on current medication regimen. Repeat echocardiogram was ordered to evaluate aortic root in the setting of prior aortic aneurysm.  He has called several times concerning BP elevations. He was elevated on the last office visit, but admitted to "white coat syndrome"  He was to take his BP at home and call us to report his findings. He reported elevated BP on last phone calls, and was increased on amlodipine to 10 mg daily. We are seeing him on follow up.   He continues to have elevated BP in the am, but this is before he takes his medications. He has been taking amlodipine 5 mg BID instead of 10 mg at one time. He denies chest pain or dyspnea. Multiple questions today.   The patient does not have  symptoms concerning for COVID-19 infection (fever, chills, cough, or new SHORTNESS OF BREATH).    Prior CV studies:   The following studies were reviewed today:  Echocardiogram 04/10/2018 Left ventricle: The cavity size was normal. Wall thickness was   increased in a pattern of mild LVH. Systolic function was normal.   The estimated ejection fraction was in the range of 55% to 60%.   Wall motion was normal; there were no regional wall motion   abnormalities. Doppler parameters are consistent with  abnormal   left ventricular relaxation (grade 1 diastolic dysfunction). - Aortic root: The aortic root was moderately dilated. - Mitral valve: Calcified annulus. - Left atrium: The atrium was mildly dilated. - Atrial septum: There was an atrial septal aneurysm.  Impressions:  - Normal LV systolic function; mild LVH; mild diastolic   dysfunction; moderately  dilated aortic root (4.6 cm); suggest CTA   or MRA to further assess; mild LAE; atrial septal aneurysm.  Past Medical History:  Diagnosis Date  . Abscess of gallbladder   . 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 Basal cell carcinoma of skin   . Broken jaw ([place])    right lower   . Cholecystitis   . Coronary heart disease    has a stent  . CVA (cerebral infarction) 2007   "mild"  . Fatigue 07/26/2011  . History of cancer chemotherapy   . History of radiation therapy 06/18/2011- 08/03/2011   squamous cell tongue/69.96 Gy 80fx  . Hypercholesteremia    under control  . Myocardial infarction ([place])    approx 2008 or 2009  . Numbness    right lower   . Numbness and tingling in left hand    numbess 2 fingers   . 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 Peripheral vascular disease ([employer])   . PFO (patent foramen ovale)   . Prostate cancer ([place]) 2002  . Skin cancer    basal cell, squamous cell  . Squamous cell carcinoma of skin   . Stroke [facility])    appro in 08 or 09   Past Surgical History:  Procedure Laterality Date  . CHOLECYSTECTOMY  04/2009   3 surgeries involved: lap chole x2 laparotomy  . CORONARY ARTERY BYPASS GRAFT  1991   triple  . ELBOW SURGERY  42 years ago   removal left radial head due to hockey accident  . EXPLORATORY LAPAROTOMY  04/2010   Dr. Redmond Pulling (3rd surgery for gall bladder)  . INGUINAL HERNIA REPAIR Right 09/23/2014   Procedure: RIGHT INGUINAL HERNIA REPAIR WITH MESH;  Surgeon: Jackolyn Confer, MD;  [facility];  Service: General;  Laterality: Right;  . INGUINAL HERNIA REPAIR Left 09/30/2015   Procedure: HERNIA REPAIR LEFT INGUINAL ADULT;  Surgeon: Jackolyn Confer, MD;  [facility];  Service: General;  Laterality: Left;  . INSERTION OF MESH Left 09/30/2015   Procedure: INSERTION OF MESH;  Surgeon: Jackolyn Confer, MD;  [facility];  Service: General;  Laterality: Left;  Marland Kitchen MULTIPLE TOOTH EXTRACTIONS     prior to radiation  . PEG TUBE  PLACEMENT  06/13/11 at [facility] IR   10/2011 removed  . PROSTATECTOMY  2002  . TONSILLECTOMY     x2: as child then 1985 for Left lingual tonsil removed  . TOTAL HIP ARTHROPLASTY Left 02/08/2016   Procedure: TOTAL HIP ARTHROPLASTY ANTERIOR APPROACH;  Surgeon: Frederik Pear, MD;  Location: [place];  Service: Orthopedics;  Laterality: Left;  . ULNAR NERVE TRANSPOSITION Left 10/16/2018   Procedure: ULNAR NERVE DECOMPRESSION/TRANSPOSITION;  Surgeon: [name], MD;  Location: [place];  Service: Orthopedics;  Laterality: Left;     Current Meds  Medication Sig  . amLODipine (NORVASC) 5 MG tablet Take 1 tablet (5 mg total) by mouth daily.  Marland Kitchen aspirin EC 81 MG tablet Take 1 tablet (81 mg total) by mouth daily.  Marland Kitchen atorvastatin (LIPITOR) 40 MG tablet Take 1 tablet (40 mg total) by mouth daily at 6 PM. (  Patient taking differently: Take 40 mg by mouth See admin instructions. Take 40 mg by mouth 6 days weekly except Saturday)  . B Complex-Biotin-FA (VITAMIN B50 COMPLEX PO) Take 50 mg by mouth daily.  . calcium carbonate (OS-CAL) 600 MG TABS Take 1,200 mg by mouth every evening.   . cholecalciferol (VITAMIN D) 1000 UNITS tablet Take 1,000 Units by mouth daily.  . fish oil-omega-3 fatty acids 1000 MG capsule Take 1 g by mouth daily.   . fluorouracil (EFUDEX) 5 % cream Apply 1 application topically daily as needed (ear).  . Hyaluronic Acid-Vitamin C (HYALURONIC ACID PO) Take 100 mg by mouth daily.  Marland Kitchen HYDROcodone-acetaminophen (NORCO) 5-325 MG tablet Take 1 tablet by mouth every 6 (six) hours as needed.  . Lecithin 1200 MG CAPS Take 1,200 mg by mouth daily.   Marland Kitchen losartan (COZAAR) 50 MG tablet Take 1 tablet (50 mg total) by mouth daily.  . Misc Natural Products (OSTEO BI-FLEX JOINT SHIELD PO) Take 100 mg by mouth every evening. Pt not sure of dosage.  . Multiple Vitamin (MULTIVITAMIN WITH MINERALS) TABS tablet Take 1 tablet by mouth daily.  . mupirocin ointment (BACTROBAN) 2 % Apply 1 application topically 2 (two) times  daily as needed (inner nose).   Marland Kitchen OVER THE COUNTER MEDICATION Take 20 mg by mouth daily. Lutigold  . pyridOXINE (VITAMIN B-6) 50 MG tablet Take 50 mg by mouth daily.  Marland Kitchen tretinoin (RETIN-A) 0.025 % cream Apply 1 application topically every morning.   . Turmeric 450 MG CAPS Take 450 mg by mouth daily.   Marland Kitchen UBIQUINOL PO Take 100 mg by mouth daily.   Marland Kitchen VITAMIN A PO Take 1 tablet by mouth daily.  . vitamin C (ASCORBIC ACID) 500 MG tablet Take 500 mg by mouth 2 (two) times daily.   . vitamin E 400 UNIT capsule Take 400 Units by mouth every morning. Reported on 11/30/2015  . Zinc 25 MG TABS Take 25 mg by mouth daily.     Allergies:   Patient has no known allergies.   Social History   Tobacco Use  . Smoking status: Former Smoker    Packs/day: 0.25    Years: 5.00    Pack years: 1.25    Types: Cigarettes    Last attempt to quit: 03/20/1966    Years since quitting: 52.8  . Smokeless tobacco: Never Used  Substance Use Topics  . Alcohol use: No  . Drug use: No     Family Hx: The patient's family history includes Coronary artery disease in his brother; Stroke in his mother.  ROS:   Please see the history of present illness.    All other systems reviewed and are negative.   Labs/Other Tests and Data Reviewed:    Recent Labs: 03/31/2018: ALT 16; TSH 4.840 10/09/2018: Hemoglobin 15.3; Platelets 178 12/03/2018: BUN 24; Creatinine, Ser 1.08; Potassium 4.8; Sodium 142   Recent Lipid Panel Lab Results  Component Value Date/Time   CHOL 189 03/31/2018 12:00 AM   TRIG 63 03/31/2018 12:00 AM   HDL 80 03/31/2018 12:00 AM   CHOLHDL 2.4 03/31/2018 12:00 AM   CHOLHDL 2.1 12/28/2016 09:23 AM   LDLCALC 96 03/31/2018 12:00 AM   LDLDIRECT 168.1 12/15/2012 03:39 PM    Wt Readings from Last 3 Encounters:  12/24/18 183 lb (83 kg)  10/16/18 178 lb (80.7 kg)  10/09/18 178 lb (80.7 kg)     Exam:    Vital Signs:  BP (!) 155/99 (BP Location: Left Arm)  Pulse 65   Ht 5\' 9"  (1.753 m)   Wt 183 lb  (83 kg)   BMI 27.02 kg/m    No acute distress. Assessment is severely limited due to telephone visit.   ASSESSMENT & PLAN:    1. Hypertension: He continues to have elevated BP's in the am.  He is taking the amlodipine in divided doses. I will have him take the amlodipine at HS at 10 mg to avoid am hypertension and to take the losartan 50 mg in the am as directed. He will take his BP twice a day and record. He will send me an email via My Chart with his recorded BP. He is given parameters of 130-140/60 -70. If he is exceeding this will likely go up on losartan and possibly add chlorthalidone.   Multiple questions are answered. He wishes to see Dr. Stanford Breed on next appointment.   2. CAD: No symptoms of angina or DOE. He is staying in and avoiding crowds. He will need to have a repeat echo in two years unless he is symptomatic.   3. Hypercholesterolemia: Continue atorvastatin 40 mg daily except on Saturdays. Will need follow up labs when seen again with goal of LDL < 70. Last recorded LDL in 03/2018 was 96.   COVID-19 Education: The signs and symptoms of COVID-19 were discussed with the patient and how to seek care for testing (follow up with PCP or arrange E-visit). The importance of social distancing was discussed today.  Patient Risk:   After full review of this patients clinical status, I feel that they are at least moderate risk at this time.  Time:   Today, I have spent 17 minutes with the patient with telehealth technology discussing diagnosis, medications, and follow up. Multiple questions are answered. Reinforcement of COVID precautions, as his wife was asking questions as well.    Medication Adjustments/Labs and Tests Ordered: Current medicines are reviewed at length with the patient today.  Concerns regarding medicines are outlined above.  Tests Ordered: No orders of the defined types were placed in this encounter.  Medication Changes: No orders of the defined types were placed  in this encounter.   Disposition: 3 months -wants to see Dr. Stanford Breed on next visit.   Signed, Phill Myron. West Pugh, ANP, [facility]  12/24/2018 10:26 AM    [facility] [place], [place], [place]  [address] Phone: [phone]; Fax: [phone]

## 2018-12-22 NOTE — Telephone Encounter (Signed)
[facility]

## 2018-12-24 ENCOUNTER — Telehealth (INDEPENDENT_AMBULATORY_CARE_PROVIDER_SITE_OTHER): Payer: Medicare Other | Admitting: Adult Health

## 2018-12-24 ENCOUNTER — Ambulatory Visit: Payer: Medicare Other | Admitting: Adult Health

## 2018-12-24 VITALS — BP 155/99 | HR 65 | Ht 69.0 in | Wt 183.0 lb

## 2018-12-24 DIAGNOSIS — I2510 Atherosclerotic heart disease of native coronary artery without angina pectoris: Secondary | ICD-10-CM | POA: Diagnosis not present

## 2018-12-24 DIAGNOSIS — I10 Essential (primary) hypertension: Secondary | ICD-10-CM

## 2018-12-24 DIAGNOSIS — E7800 Pure hypercholesterolemia, unspecified: Secondary | ICD-10-CM

## 2018-12-24 MED ORDER — AMLODIPINE BESYLATE 10 MG PO TABS: 10.0000 mg | ORAL_TABLET | Freq: Every day | ORAL | 1 refills | Status: DC

## 2018-12-24 MED ORDER — LOSARTAN POTASSIUM 50 MG PO TABS: 50.0000 mg | ORAL_TABLET | Freq: Every day | ORAL | 1 refills | Status: DC

## 2018-12-24 NOTE — Patient Instructions (Addendum)
Medication Instructions:  CONTINUE AMLODIPINE 10MG  AT BEDTIME If you need a refill on your cardiac medications before your next appointment, please call your pharmacy.  Special Instructions: CONTINUE TO TAKE AND LOG YOUR BP TWICE WEEKLY PLEASE EMAIL THE BP AND HR INCLUDED.   Follow-Up: You will need a follow up appointment in 3 months.  You may see Jason Ruths, MD or one of the following Advanced Practice Providers on your designated Care Team:  Kerin Ransom, PA-C  Roby Lofts, PA-C  [place], [place] . SOMEONE WILL BE CALLING YOU TO [place].      At [facility], you and your health needs are our priority.  As part of our continuing mission to provide you with exceptional heart care, we have created designated Provider Care Teams.  These Care Teams include your primary Cardiologist (physician) and Advanced Practice Providers (APPs -  Physician Assistants and Nurse Practitioners) who all work together to provide you with the care you need, when you need it.  Thank you for choosing [facility] at [facility]!!

## 2019-01-16 IMAGING — CR DG HIP (WITH OR WITHOUT PELVIS) 2-3V*R*
2 series · 2 of 2 positions shown · non-contrast
Comparison: None.

CLINICAL DATA: Hip pain

EXAM:
DG HIP (WITH OR WITHOUT PELVIS) 2-3V RIGHT

[w hip ap right]
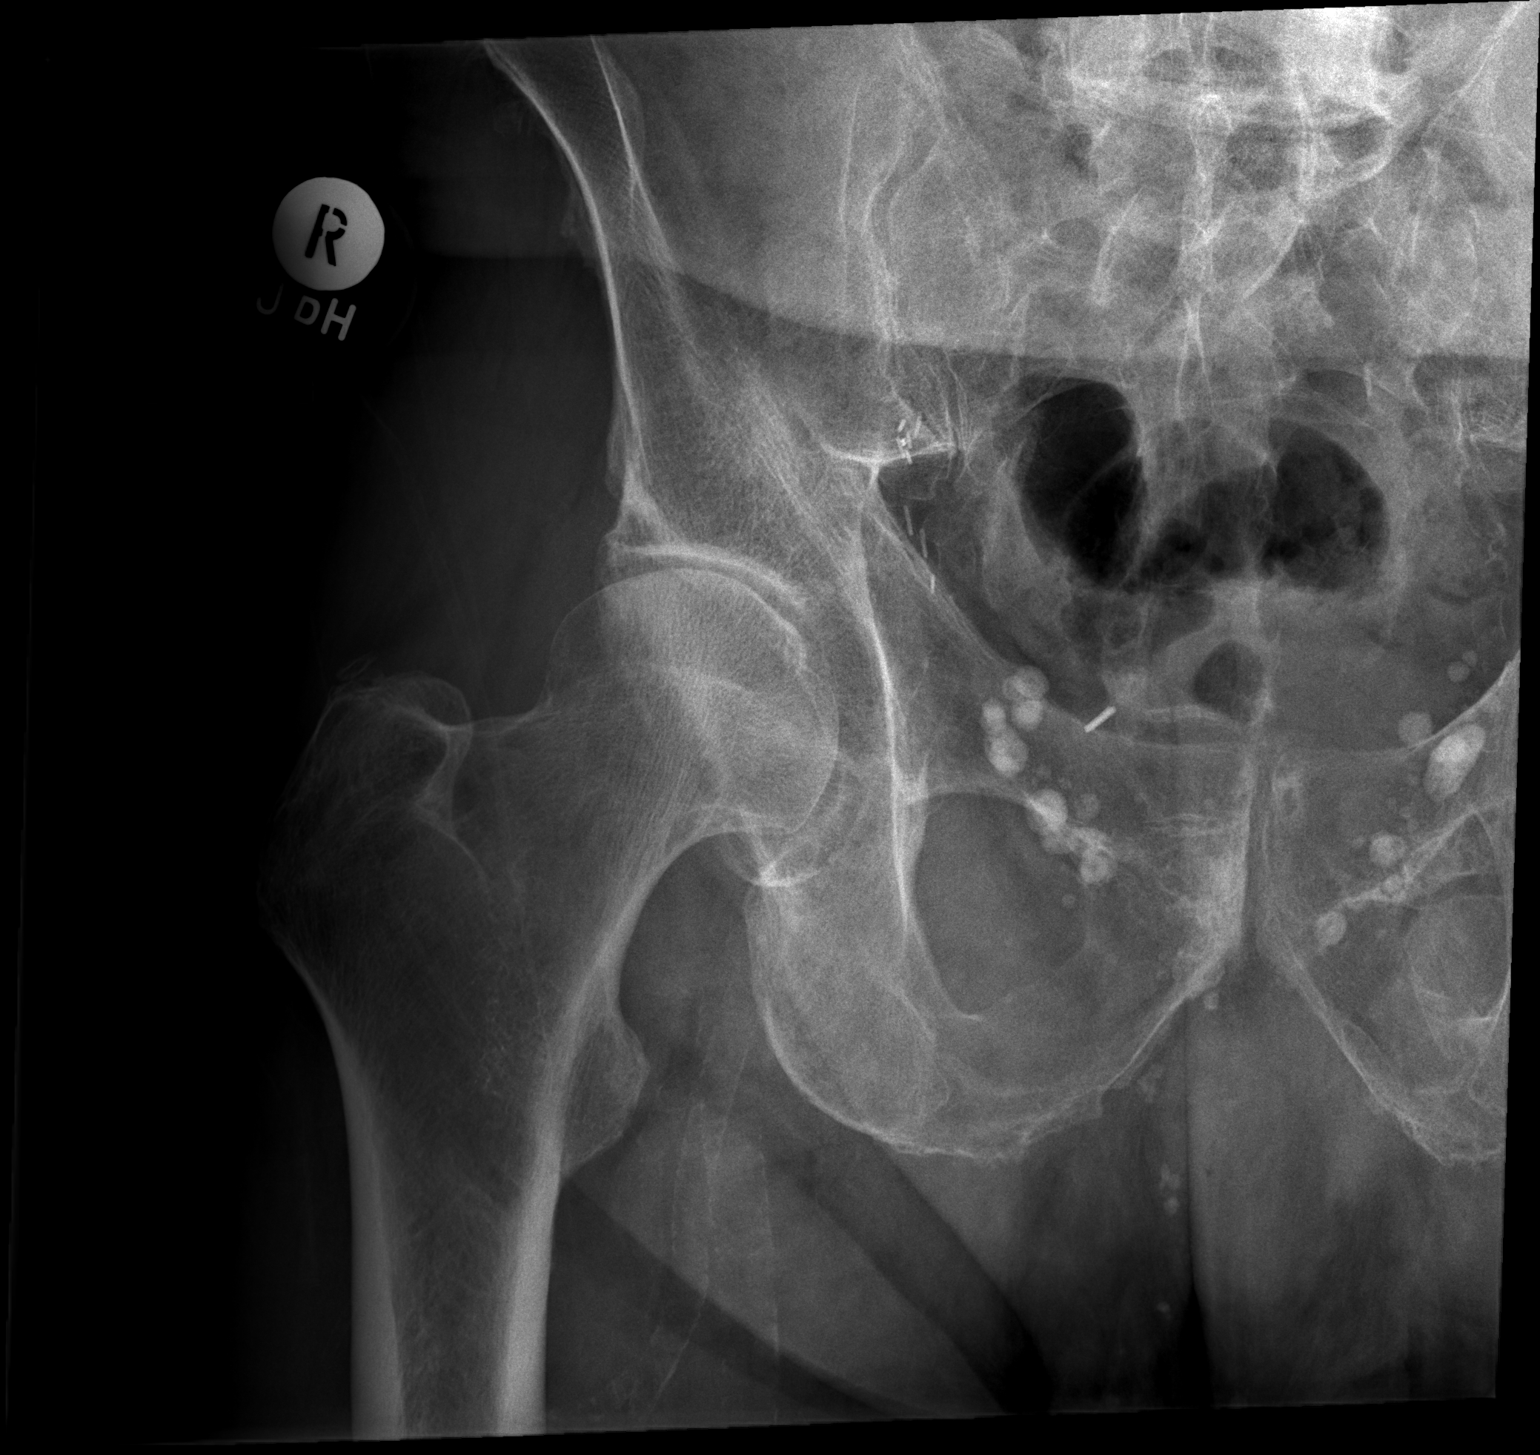

[w hip frog right]
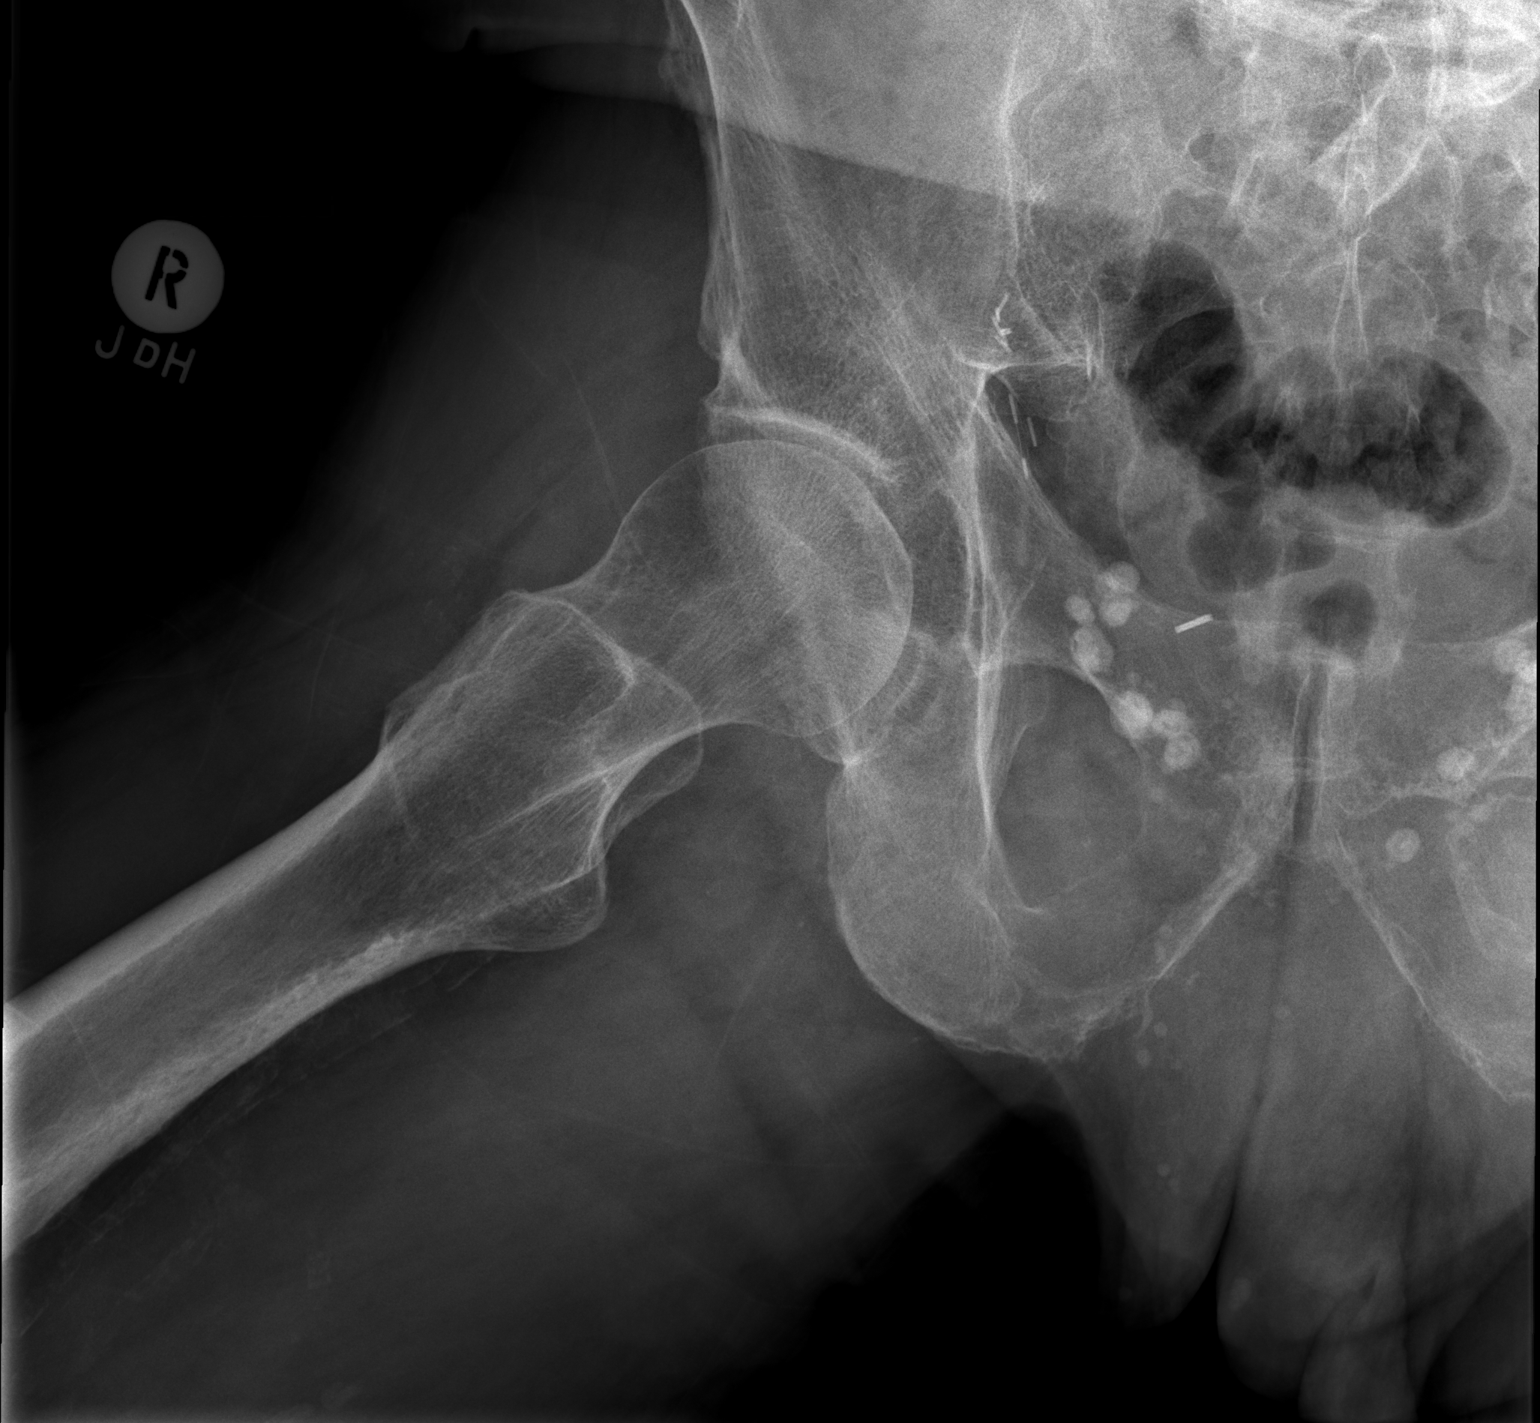

[2 of 2 positions shown; findings below may reference images not displayed]

FINDINGS: Multiple calcified phleboliths in the pelvis. Surgical clips in the
right pelvis. Mild to moderate degenerative change of the right hip.
No fracture or malalignment. There are vascular calcifications.
IMPRESSION: Mild to moderate arthritis of the right hip. No acute osseous
abnormality.

## 2019-01-16 IMAGING — CR DG LUMBAR SPINE COMPLETE 4+V
5 series · 5 of 5 positions shown · non-contrast
Comparison: MRI 05/04/2015

CLINICAL DATA: Low back pain

EXAM:
LUMBAR SPINE - COMPLETE 4+ VIEW

[w lumbar spine ap]
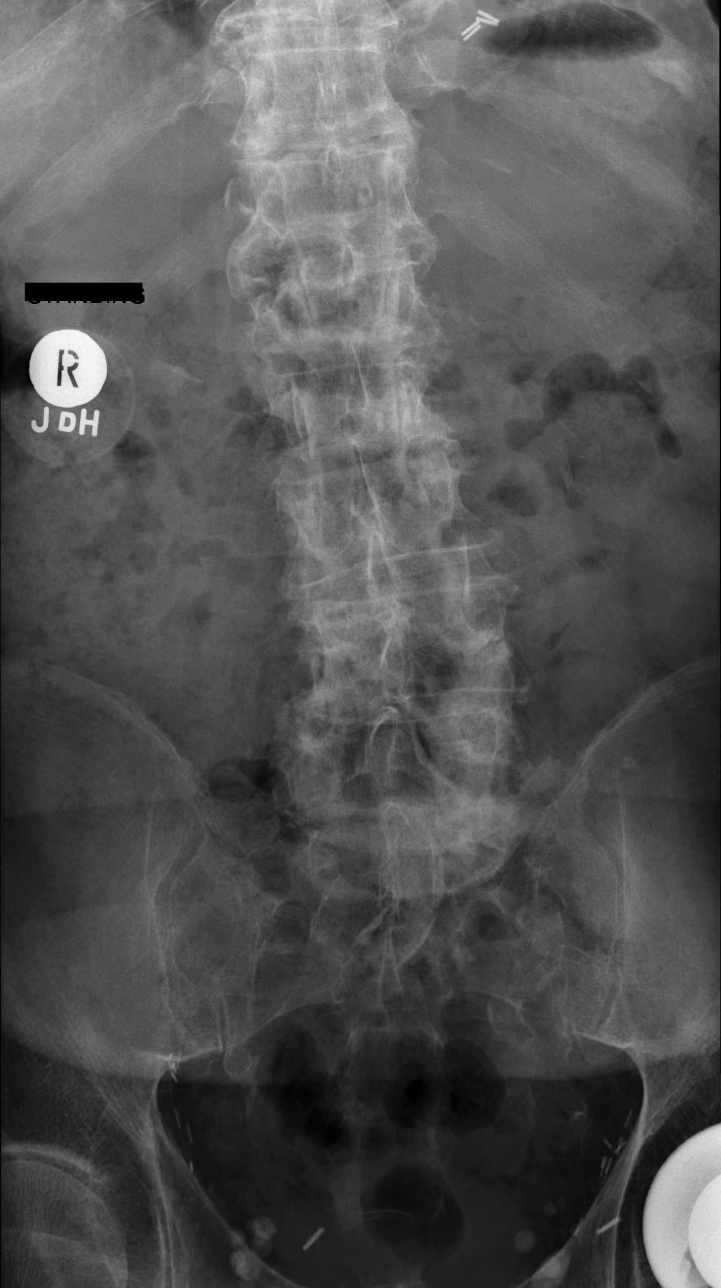

[w lumbar spine obl (1 of 2)]
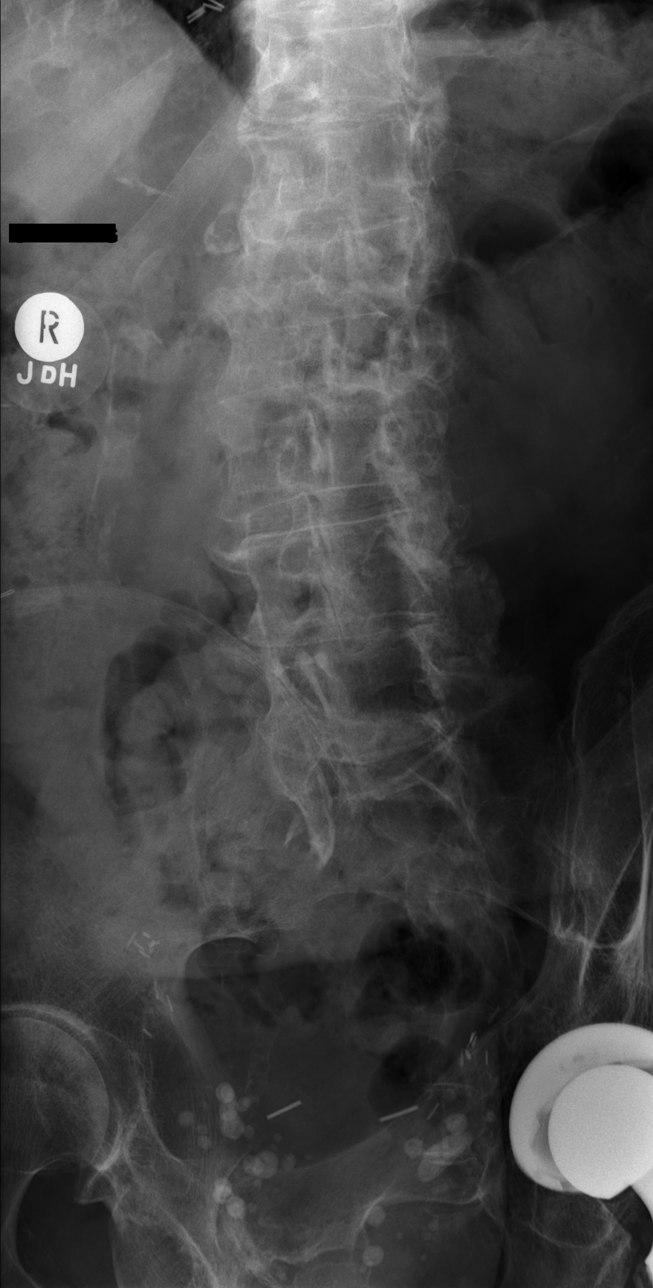

[w lumbar spine obl (2 of 2)]
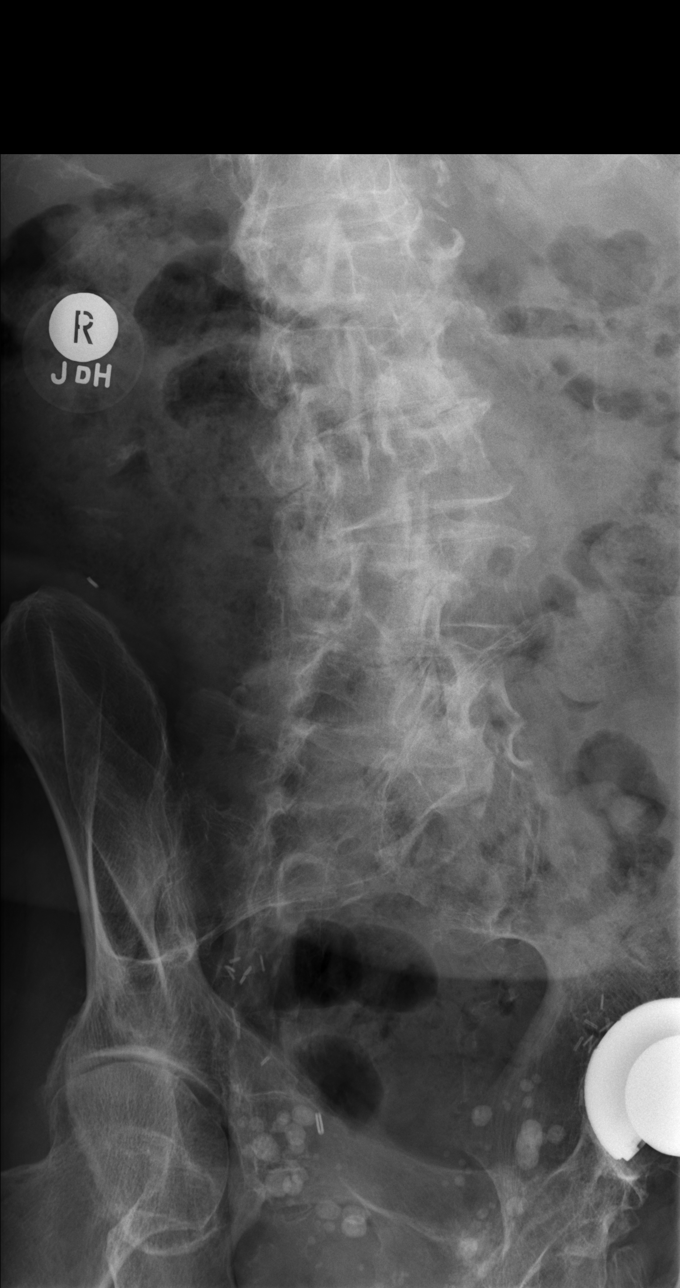

[w lumbar spine lat]
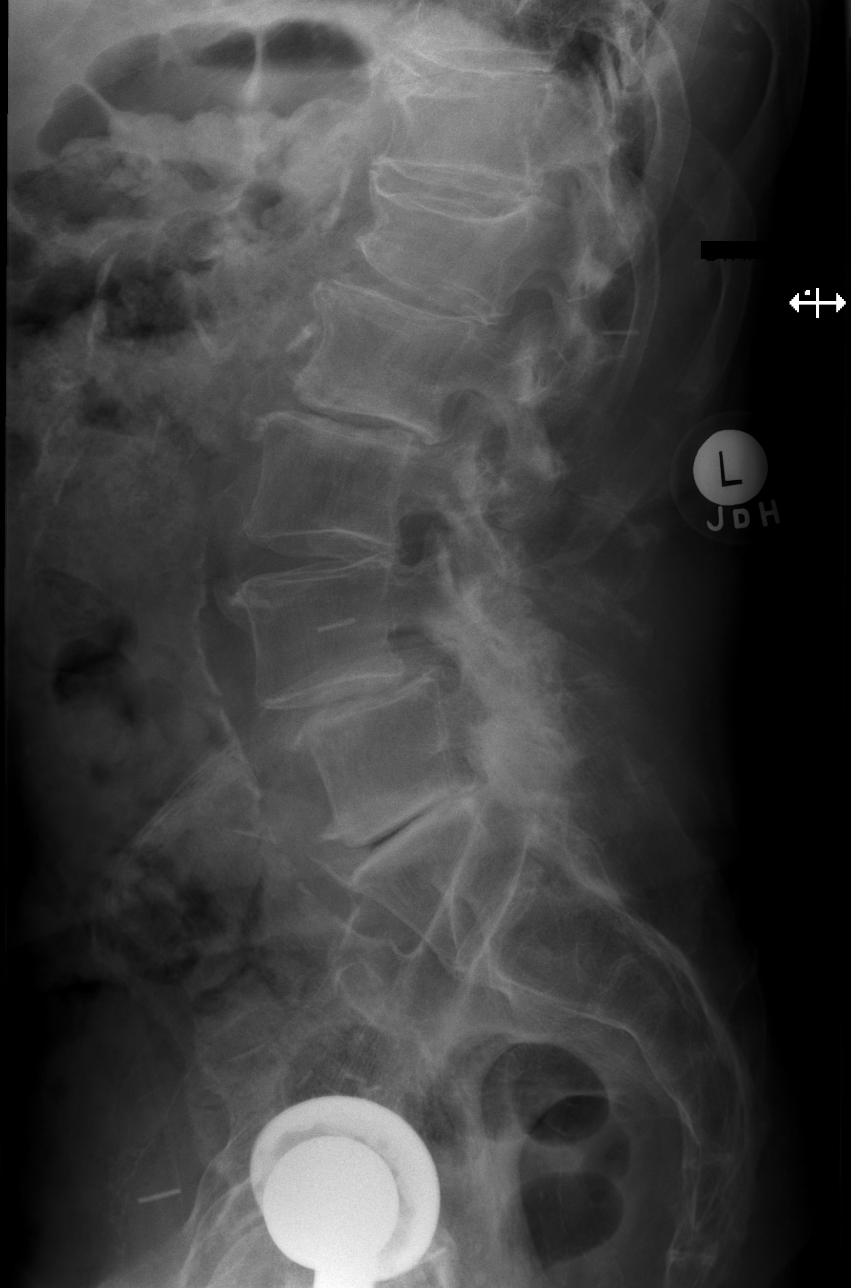

[w lumbar l-5 s-1 spot]
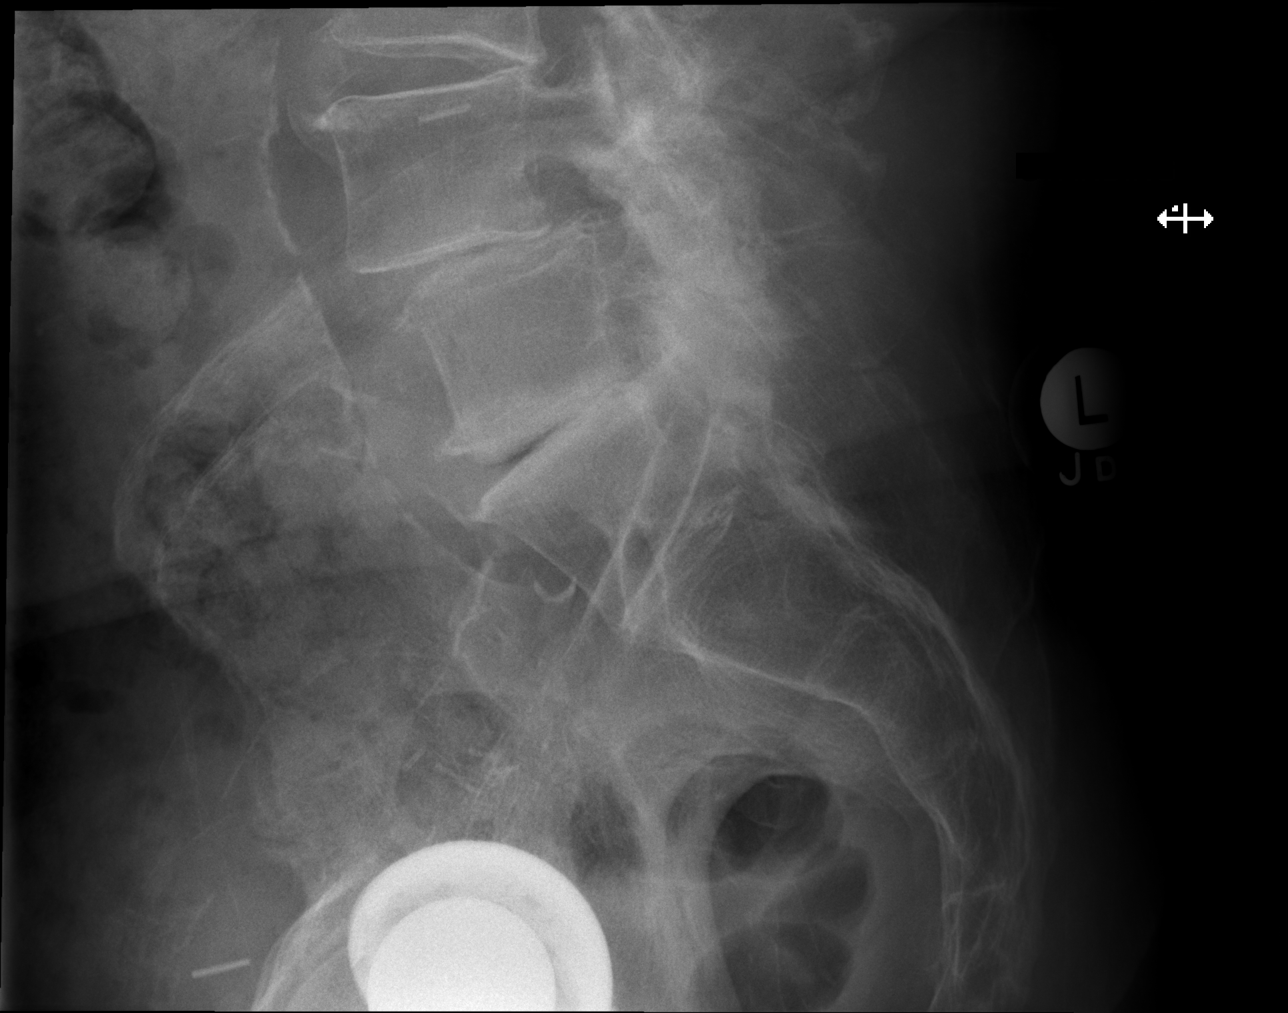

[5 of 5 positions shown; findings below may reference images not displayed]

FINDINGS: Partially visualized left hip replacement. SI joints are patent.
Mild scoliosis of the lumbar spine. Lumbar numbering consistent with
1344 MRI. 9 mm anterolisthesis L4 on L5. 6 mm retrolisthesis L2 on
L3. Mild chronic compression deformity at L1. Moderate degenerative
change at L2-L3 and L4-L5, marked degenerative change at L5-S1. Mild
anterior osteophytosis. Prominent facet degenerative change L4
through the upper sacrum
IMPRESSION: 1. Scoliosis and multiple level degenerative changes. 9 mm
anterolisthesis L4 on L5 and 6 mm retrolisthesis L2 on L3, appears
slightly more prominent compared to 1344 MRI.
2. Chronic compression deformity at L1.

## 2019-02-20 DIAGNOSIS — D485 Neoplasm of uncertain behavior of skin: Secondary | ICD-10-CM | POA: Diagnosis not present

## 2019-02-20 DIAGNOSIS — C4359 Malignant melanoma of other part of trunk: Secondary | ICD-10-CM | POA: Diagnosis not present

## 2019-02-20 DIAGNOSIS — L570 Actinic keratosis: Secondary | ICD-10-CM | POA: Diagnosis not present

## 2019-02-20 DIAGNOSIS — D0462 Carcinoma in situ of skin of left upper limb, including shoulder: Secondary | ICD-10-CM | POA: Diagnosis not present

## 2019-02-20 DIAGNOSIS — D2271 Melanocytic nevi of right lower limb, including hip: Secondary | ICD-10-CM | POA: Diagnosis not present

## 2019-02-20 DIAGNOSIS — D1730 Benign lipomatous neoplasm of skin and subcutaneous tissue of unspecified sites: Secondary | ICD-10-CM | POA: Diagnosis not present

## 2019-02-20 DIAGNOSIS — D045 Carcinoma in situ of skin of trunk: Secondary | ICD-10-CM | POA: Diagnosis not present

## 2019-02-20 DIAGNOSIS — Z85828 Personal history of other malignant neoplasm of skin: Secondary | ICD-10-CM | POA: Diagnosis not present

## 2019-02-25 DIAGNOSIS — G5622 Lesion of ulnar nerve, left upper limb: Secondary | ICD-10-CM | POA: Diagnosis not present

## 2019-03-18 NOTE — Progress Notes (Signed)
HPI: FU CAD; s/p CABG in 1991 and SVG BMS in 2008. Echocardiogram April 2016 showed normal LV systolic function, grade 1 diastolic dysfunction, mild left atrial enlargement and mild tricuspid regurgitation. The ascending aorta measured 4.5 cm. Last echocardiogram July 2019 showed normal LV function, mild diastolic dysfunction, dilated aortic root at 4.6 cm, mild left atrial enlargement.  Patient has been seen recently for elevated blood pressure.  Since last seen,  there is no chest pain, palpitations or syncope.  Current Outpatient Medications  Medication Sig Dispense Refill  . amLODipine (NORVASC) 10 MG tablet Take 1 tablet (10 mg total) by mouth daily. 90 tablet 1  . aspirin EC 81 MG tablet Take 1 tablet (81 mg total) by mouth daily.    Marland Kitchen atorvastatin (LIPITOR) 40 MG tablet Take 1 tablet (40 mg total) by mouth daily at 6 PM. (Patient taking differently: Take 40 mg by mouth See admin instructions. Take 40 mg by mouth 6 days weekly except Saturday) 90 tablet 3  . B Complex-Biotin-FA (VITAMIN B50 COMPLEX PO) Take 50 mg by mouth daily.    . calcium carbonate (OS-CAL) 600 MG TABS Take 1,200 mg by mouth every evening.     . cholecalciferol (VITAMIN D) 1000 UNITS tablet Take 1,000 Units by mouth daily.    . fish oil-omega-3 fatty acids 1000 MG capsule Take 1 g by mouth daily.     Marland Kitchen Hyaluronic Acid-Vitamin C (HYALURONIC ACID PO) Take 100 mg by mouth daily.    . Lecithin 1200 MG CAPS Take 1,200 mg by mouth daily.     Marland Kitchen losartan (COZAAR) 50 MG tablet Take 1 tablet (50 mg total) by mouth daily. 90 tablet 1  . Multiple Vitamin (MULTIVITAMIN WITH MINERALS) TABS tablet Take 1 tablet by mouth daily.    . mupirocin ointment (BACTROBAN) 2 % Apply 1 application topically 2 (two) times daily as needed (inner nose).     Marland Kitchen OVER THE COUNTER MEDICATION Take 20 mg by mouth daily. Lutigold    . pyridOXINE (VITAMIN B-6) 50 MG tablet Take 50 mg by mouth daily.    Marland Kitchen tretinoin (RETIN-A) 0.025 % cream Apply 1  application topically every morning.     . Turmeric 450 MG CAPS Take 450 mg by mouth daily.     Marland Kitchen UBIQUINOL PO Take 100 mg by mouth daily.     Marland Kitchen VITAMIN A PO Take 1 tablet by mouth daily.    . vitamin C (ASCORBIC ACID) 500 MG tablet Take 500 mg by mouth daily.     . vitamin E 400 UNIT capsule Take 400 Units by mouth every morning. Reported on 11/30/2015    . Zinc 25 MG TABS Take 25 mg by mouth daily.     No current facility-administered medications for this visit.    Facility-Administered Medications Ordered in Other Visits  Medication Dose Route Frequency Provider Last Rate Last Dose  . topical emolient (BIAFINE) emulsion   Topical BID Kyung Rudd, MD         Past Medical History:  Diagnosis Date  . Abscess of gallbladder   . 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 Basal cell carcinoma of skin   . Broken jaw ([place])    right lower   . Cholecystitis   . Coronary heart disease    has a stent  . CVA (cerebral infarction) 2007   "mild"  . Fatigue 07/26/2011  . History of cancer chemotherapy   .  History of radiation therapy 06/18/2011- 08/03/2011   squamous cell tongue/69.96 Gy 68fx  . Hypercholesteremia    under control  . Myocardial infarction ([place])    approx 2008 or 2009  . Numbness    right lower   . Numbness and tingling in left hand    numbess 2 fingers   . 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 Peripheral vascular disease ([place])   . PFO (patent foramen ovale)   . Prostate cancer ([place]) 2002  . Skin cancer    basal cell, squamous cell  . Squamous cell carcinoma of skin   . Stroke [facility])    appro in 08 or 09    Past Surgical History:  Procedure Laterality Date  . CHOLECYSTECTOMY  04/2009   3 surgeries involved: lap chole x2 laparotomy  . CORONARY ARTERY BYPASS GRAFT  1991   triple  . ELBOW SURGERY  42 years ago   removal left radial head due to hockey accident  . EXPLORATORY LAPAROTOMY  04/2010   Dr. Redmond Pulling (3rd surgery for gall bladder)  .  INGUINAL HERNIA REPAIR Right 09/23/2014   Procedure: RIGHT INGUINAL HERNIA REPAIR WITH MESH;  Surgeon: Jackolyn Confer, MD;  [facility];  Service: General;  Laterality: Right;  . INGUINAL HERNIA REPAIR Left 09/30/2015   Procedure: HERNIA REPAIR LEFT INGUINAL ADULT;  Surgeon: Jackolyn Confer, MD;  [facility];  Service: General;  Laterality: Left;  . INSERTION OF MESH Left 09/30/2015   Procedure: INSERTION OF MESH;  Surgeon: Jackolyn Confer, MD;  [facility];  Service: General;  Laterality: Left;  Marland Kitchen MULTIPLE TOOTH EXTRACTIONS     prior to radiation  . PEG TUBE PLACEMENT  06/13/11 at [facility] IR   10/2011 removed  . PROSTATECTOMY  2002  . TONSILLECTOMY     x2: as child then 1985 for Left lingual tonsil removed  . TOTAL HIP ARTHROPLASTY Left 02/08/2016   Procedure: TOTAL HIP ARTHROPLASTY ANTERIOR APPROACH;  Surgeon: Frederik Pear, MD;  Location: [place];  Service: Orthopedics;  Laterality: Left;  . ULNAR NERVE TRANSPOSITION Left 10/16/2018   Procedure: ULNAR NERVE DECOMPRESSION/TRANSPOSITION;  Surgeon: [name], MD;  Location: [place];  Service: Orthopedics;  Laterality: Left;    Social History   Socioeconomic History  . Marital status: Married    Spouse name: Not on file  . Number of children: Not on file  . Years of education: Not on file  . Highest education level: Not on file  Occupational History  . Not on file  Social Needs  . Financial resource strain: Not on file  . Food insecurity    Worry: Not on file    Inability: Not on file  . Transportation needs    Medical: Not on file    Non-medical: Not on file  Tobacco Use  . Smoking status: Former Smoker    Packs/day: 0.25    Years: 5.00    Pack years: 1.25    Types: Cigarettes    Quit date: 03/20/1966    Years since quitting: 53.0  . Smokeless tobacco: Never Used  Substance and Sexual Activity  . Alcohol use: No  . Drug use: No  . Sexual activity: Not on file  Lifestyle  . Physical activity    Days per week: Not  on file    Minutes per session: Not on file  . Stress: Not on file  Relationships  . Social connections    Talks on phone: Not on file  Gets together: Not on file    Attends religious service: Not on file    Active member of club or organization: Not on file    Attends meetings of clubs or organizations: Not on file    Relationship status: Not on file  . Intimate partner violence    Fear of current or ex partner: Not on file    Emotionally abused: Not on file    Physically abused: Not on file    Forced sexual activity: Not on file  Other Topics Concern  . Not on file  Social History Narrative  . Not on file    Family History  Problem Relation Age of Onset  . Stroke Mother   . Coronary artery disease Brother        had bypass    ROS: no fevers or chills, productive cough, hemoptysis, dysphasia, odynophagia, melena, hematochezia, dysuria, hematuria, rash, seizure activity, orthopnea, PND, pedal edema, claudication. Remaining systems are negative.  Physical Exam: Well-developed well-nourished in no acute distress.  Skin is warm and dry.  HEENT is normal.  Neck is supple.  Chest is clear to auscultation with normal expansion.  Cardiovascular exam is regular rate and rhythm.  Abdominal exam nontender or distended. No masses palpated. Extremities show no edema. neuro grossly intact  A/P  1 coronary artery disease status post coronary bypass graft-patient denies chest pain.  Plan to continue medical therapy with aspirin and statin.  2 hyperlipidemia-continue statin.  Check lipids and liver.  3 hypertension-patient's blood pressure is controlled.  Continue present medications and follow.  4 history of CVA with patent foramen ovale-no recurrent episodes.  Continue aspirin.  5 thoracic aortic aneurysm-we will arrange CTA to further assess.  Kirk Ruths, MD

## 2019-03-19 ENCOUNTER — Telehealth: Payer: Self-pay | Admitting: Cardiology

## 2019-03-19 NOTE — Telephone Encounter (Signed)
LVM, reming pt of hs appt with Dr Stanford Breed on 03-23-19.

## 2019-03-23 ENCOUNTER — Telehealth: Payer: Self-pay | Admitting: *Deleted

## 2019-03-23 ENCOUNTER — Ambulatory Visit (INDEPENDENT_AMBULATORY_CARE_PROVIDER_SITE_OTHER): Payer: Medicare Other | Admitting: Cardiology

## 2019-03-23 ENCOUNTER — Encounter: Payer: Self-pay | Admitting: Cardiology

## 2019-03-23 ENCOUNTER — Other Ambulatory Visit: Payer: Self-pay

## 2019-03-23 VITALS — BP 134/78 | HR 66 | Temp 97.7°F | Ht 69.0 in | Wt 182.0 lb

## 2019-03-23 DIAGNOSIS — E7800 Pure hypercholesterolemia, unspecified: Secondary | ICD-10-CM | POA: Diagnosis not present

## 2019-03-23 DIAGNOSIS — I2510 Atherosclerotic heart disease of native coronary artery without angina pectoris: Secondary | ICD-10-CM | POA: Diagnosis not present

## 2019-03-23 DIAGNOSIS — I712 Thoracic aortic aneurysm, without rupture, unspecified: Secondary | ICD-10-CM

## 2019-03-23 NOTE — Patient Instructions (Signed)
Medication Instructions:  NO CHANGE If you need a refill on your cardiac medications before your next appointment, please call your pharmacy.   Lab work: Your physician recommends that you HAVE LAB WORK TODAY If you have labs (blood work) drawn today and your tests are completely normal, you will receive your results only by: Marland Kitchen MyChart Message (if you have MyChart) OR . A paper copy in the mail If you have any lab test that is abnormal or we need to change your treatment, we will call you to review the results.  Testing/Procedures: CTA OF THE CHEST FOR THORACIC ANEURYSM  Follow-Up: At [facility], you and your health needs are our priority.  As part of our continuing mission to provide you with exceptional heart care, we have created designated Provider Care Teams.  These Care Teams include your primary Cardiologist (physician) and Advanced Practice Providers (APPs -  Physician Assistants and Nurse Practitioners) who all work together to provide you with the care you need, when you need it. You will need a follow up appointment in 6 months.  Please call our office 2 months in advance to schedule this appointment.  You may see Kirk Ruths, MD or one of the following Advanced Practice Providers on your designated Care Team:   Kerin Ransom, PA-C Roby Lofts, [place] . Sande Rives, PA-C

## 2019-03-23 NOTE — Telephone Encounter (Signed)
Left message for patient to call regarding appointment for CTA Aorta scheduled for 04/03/19 at 3:00pm at [address]---Clear liquids only 4 hours prior to study

## 2019-03-24 ENCOUNTER — Encounter: Payer: Self-pay | Admitting: *Deleted

## 2019-03-24 ENCOUNTER — Telehealth: Payer: Self-pay | Admitting: *Deleted

## 2019-03-24 DIAGNOSIS — D045 Carcinoma in situ of skin of trunk: Secondary | ICD-10-CM | POA: Diagnosis not present

## 2019-03-24 DIAGNOSIS — D0462 Carcinoma in situ of skin of left upper limb, including shoulder: Secondary | ICD-10-CM | POA: Diagnosis not present

## 2019-03-24 DIAGNOSIS — E7800 Pure hypercholesterolemia, unspecified: Secondary | ICD-10-CM

## 2019-03-24 LAB — COMPREHENSIVE METABOLIC PANEL
ALT: 17 IU/L (ref 0–44)
AST: 25 IU/L (ref 0–40)
Albumin/Globulin Ratio: 2 (ref 1.2–2.2)
Albumin: 4.4 g/dL (ref 3.7–4.7)
Alkaline Phosphatase: 83 IU/L (ref 39–117)
BUN/Creatinine Ratio: 20 (ref 10–24)
BUN: 22 mg/dL (ref 8–27)
Bilirubin Total: 0.9 mg/dL (ref 0.0–1.2)
CO2: 21 mmol/L (ref 20–29)
Calcium: 9.8 mg/dL (ref 8.6–10.2)
Chloride: 105 mmol/L (ref 96–106)
Creatinine, Ser: 1.1 mg/dL (ref 0.76–1.27)
GFR calc Af Amer: 74 mL/min/{1.73_m2} (ref >59)
GFR calc non Af Amer: 64 mL/min/{1.73_m2} (ref >59)
Globulin, Total: 2.2 g/dL (ref 1.5–4.5)
Glucose: 94 mg/dL (ref 65–99)
Potassium: 4.4 mmol/L (ref 3.5–5.2)
Sodium: 141 mmol/L (ref 134–144)
Total Protein: 6.6 g/dL (ref 6.0–8.5)

## 2019-03-24 LAB — LIPID PANEL
Chol/HDL Ratio: 2.3 ratio (ref 0.0–5.0)
Cholesterol, Total: 169 mg/dL (ref 100–199)
HDL: 73 mg/dL (ref >39)
LDL Calculated: 84 mg/dL (ref 0–99)
Triglycerides: 62 mg/dL (ref 0–149)
VLDL Cholesterol Cal: 12 mg/dL (ref 5–40)

## 2019-03-24 MED ORDER — ATORVASTATIN CALCIUM 80 MG PO TABS: 80.0000 mg | ORAL_TABLET | Freq: Every day | ORAL | 3 refills | Status: DC

## 2019-03-24 NOTE — Telephone Encounter (Signed)
Mailed letter to patient with appoinemtn information for CTA scheduled 04/03/19 at [facility]

## 2019-03-24 NOTE — Telephone Encounter (Signed)
Left message regarding appointment for CTA chest aorta scheduled for Friday 04/03/19 at 3:00pm at [facility] time 2:50pm for check in and clear liquids only 4 hours prior to exam--requested return confirmatoin call [facility]

## 2019-03-24 NOTE — Telephone Encounter (Addendum)
Results released to my chart  New script sent to the pharmacy  Lab orders mailed to the pt    ----- Message from Lelon Perla, MD sent at 03/24/2019  6:30 AM EDT ----- Change lipitor to 80 mg daily; lipids and liver 8 weeks Kirk Ruths

## 2019-03-31 DIAGNOSIS — M25561 Pain in right knee: Secondary | ICD-10-CM | POA: Diagnosis not present

## 2019-03-31 DIAGNOSIS — M1611 Unilateral primary osteoarthritis, right hip: Secondary | ICD-10-CM | POA: Diagnosis not present

## 2019-04-02 ENCOUNTER — Telehealth: Payer: Self-pay | Admitting: *Deleted

## 2019-04-02 NOTE — Telephone Encounter (Signed)

## 2019-04-03 ENCOUNTER — Other Ambulatory Visit: Payer: Self-pay

## 2019-04-03 ENCOUNTER — Ambulatory Visit (INDEPENDENT_AMBULATORY_CARE_PROVIDER_SITE_OTHER)
Admission: RE | Admit: 2019-04-03 | Discharge: 2019-04-03 | Disposition: A | Discharge status: Home / Self Care | Payer: Medicare Other | Source: Ambulatory Visit | Attending: Cardiology | Admitting: Cardiology

## 2019-04-03 DIAGNOSIS — I712 Thoracic aortic aneurysm, without rupture, unspecified: Secondary | ICD-10-CM

## 2019-04-03 MED ORDER — IOHEXOL 350 MG/ML SOLN
75.0000 mL | Freq: Once | INTRAVENOUS | Status: AC | PRN
  Administered 2019-04-03: 75 mL via INTRAVENOUS

## 2019-04-22 DIAGNOSIS — L570 Actinic keratosis: Secondary | ICD-10-CM | POA: Diagnosis not present

## 2019-05-06 ENCOUNTER — Other Ambulatory Visit (HOSPITAL_COMMUNITY): Payer: Medicare Other

## 2019-06-10 DIAGNOSIS — Z23 Encounter for immunization: Secondary | ICD-10-CM | POA: Diagnosis not present

## 2019-06-19 ENCOUNTER — Other Ambulatory Visit: Payer: Self-pay

## 2019-06-19 MED ORDER — AMLODIPINE BESYLATE 10 MG PO TABS: 10.0000 mg | ORAL_TABLET | Freq: Every day | ORAL | 1 refills | Status: DC

## 2019-08-05 DIAGNOSIS — Z20828 Contact with and (suspected) exposure to other viral communicable diseases: Secondary | ICD-10-CM | POA: Diagnosis not present

## 2019-08-25 DIAGNOSIS — C4442 Squamous cell carcinoma of skin of scalp and neck: Secondary | ICD-10-CM | POA: Diagnosis not present

## 2019-08-25 DIAGNOSIS — L578 Other skin changes due to chronic exposure to nonionizing radiation: Secondary | ICD-10-CM | POA: Diagnosis not present

## 2019-08-25 DIAGNOSIS — C44319 Basal cell carcinoma of skin of other parts of face: Secondary | ICD-10-CM | POA: Diagnosis not present

## 2019-08-25 DIAGNOSIS — D485 Neoplasm of uncertain behavior of skin: Secondary | ICD-10-CM | POA: Diagnosis not present

## 2019-08-25 DIAGNOSIS — L700 Acne vulgaris: Secondary | ICD-10-CM | POA: Diagnosis not present

## 2019-08-25 DIAGNOSIS — D2339 Other benign neoplasm of skin of other parts of face: Secondary | ICD-10-CM | POA: Diagnosis not present

## 2019-08-25 DIAGNOSIS — D2239 Melanocytic nevi of other parts of face: Secondary | ICD-10-CM | POA: Diagnosis not present

## 2019-08-25 DIAGNOSIS — Z85828 Personal history of other malignant neoplasm of skin: Secondary | ICD-10-CM | POA: Diagnosis not present

## 2019-09-24 DIAGNOSIS — M1611 Unilateral primary osteoarthritis, right hip: Secondary | ICD-10-CM | POA: Diagnosis not present

## 2019-09-24 DIAGNOSIS — M25551 Pain in right hip: Secondary | ICD-10-CM | POA: Diagnosis not present

## 2019-10-14 DIAGNOSIS — C44329 Squamous cell carcinoma of skin of other parts of face: Secondary | ICD-10-CM | POA: Diagnosis not present

## 2019-10-14 DIAGNOSIS — C44311 Basal cell carcinoma of skin of nose: Secondary | ICD-10-CM | POA: Diagnosis not present

## 2019-10-14 DIAGNOSIS — D0439 Carcinoma in situ of skin of other parts of face: Secondary | ICD-10-CM | POA: Diagnosis not present

## 2019-10-15 ENCOUNTER — Telehealth: Payer: Self-pay | Admitting: Cardiology

## 2019-10-15 NOTE — Telephone Encounter (Signed)
Attempted to call pt mailbox full unable to leave message will try later .Adonis Housekeeper

## 2019-10-15 NOTE — Telephone Encounter (Signed)
Patient is requesting to get Jason Noble email address. He states that he has 3 mos worth of BP readings with times and dates that correlate with him taking his medication and would feel more comfortable sending those to her himself.

## 2019-10-21 ENCOUNTER — Other Ambulatory Visit: Payer: Self-pay

## 2019-10-21 ENCOUNTER — Ambulatory Visit (INDEPENDENT_AMBULATORY_CARE_PROVIDER_SITE_OTHER): Payer: Medicare Other | Admitting: Cardiology

## 2019-10-21 ENCOUNTER — Encounter: Payer: Self-pay | Admitting: Cardiology

## 2019-10-21 VITALS — BP 156/82 | HR 69 | Ht 69.0 in | Wt 190.0 lb

## 2019-10-21 DIAGNOSIS — E785 Hyperlipidemia, unspecified: Secondary | ICD-10-CM | POA: Diagnosis not present

## 2019-10-21 DIAGNOSIS — I2510 Atherosclerotic heart disease of native coronary artery without angina pectoris: Secondary | ICD-10-CM | POA: Diagnosis not present

## 2019-10-21 DIAGNOSIS — I10 Essential (primary) hypertension: Secondary | ICD-10-CM

## 2019-10-21 MED ORDER — LOSARTAN POTASSIUM 100 MG PO TABS: 100.0000 mg | ORAL_TABLET | Freq: Every day | ORAL | 3 refills | Status: DC

## 2019-10-21 NOTE — Patient Instructions (Signed)
Medication Instructions:  INCREASE LOSARTAN TO 100 MG ONCE DAILY= 2 OF THE 50 MG TABLETS ONCE DAILY  *If you need a refill on your cardiac medications before your next appointment, please call your pharmacy*  Lab Work: Your physician recommends that you return for lab work in: [place]  If you have labs (blood work) drawn today and your tests are completely normal, you will receive your results only by: Marland Kitchen MyChart Message (if you have MyChart) OR . A paper copy in the mail If you have any lab test that is abnormal or we need to change your treatment, we will call you to review the results.  Follow-Up: At [facility], you and your health needs are our priority.  As part of our continuing mission to provide you with exceptional heart care, we have created designated Provider Care Teams.  These Care Teams include your primary Cardiologist (physician) and Advanced Practice Providers (APPs -  Physician Assistants and Nurse Practitioners) who all work together to provide you with the care you need, when you need it.  Your next appointment:   6 month(s)  The format for your next appointment:   Either In Person or Virtual  Provider:   You may see Kirk Ruths, MD or one of the following Advanced Practice Providers on your designated Care Team:    Kerin Ransom, PA-C  [place], [place]  Coletta Memos, [place]

## 2019-10-21 NOTE — Progress Notes (Signed)
HPI:FU CAD; s/p CABG in 1991 and SVG BMS in 2008. Echocardiogram April 2016 showed normal LV systolic function, grade 1 diastolic dysfunction, mild left atrial enlargement and mild tricuspid regurgitation. The ascending aorta measured 4.5 cm. Last echocardiogram July 2019 showed normal LV function, mild diastolic dysfunction, dilated aortic root at 4.6 cm, mild left atrial enlargement. CTA July 2020 showed decrease in size of previous 4 mm nodule in the left lower lobe. Thoracic ascending aorta measured 4.2 cm unchanged. Since last seen,patient denies dyspnea, chest pain, palpitations or syncope.  Current Outpatient Medications  Medication Sig Dispense Refill  . amLODipine (NORVASC) 10 MG tablet Take 1 tablet (10 mg total) by mouth daily. 90 tablet 1  . aspirin EC 81 MG tablet Take 1 tablet (81 mg total) by mouth daily.    Marland Kitchen atorvastatin (LIPITOR) 40 MG tablet Take 40 mg by mouth daily.    . B Complex-Biotin-FA (VITAMIN B50 COMPLEX PO) Take 50 mg by mouth daily.    . calcium carbonate (OS-CAL) 600 MG TABS Take 1,200 mg by mouth every evening.     . cholecalciferol (VITAMIN D) 1000 UNITS tablet Take 1,000 Units by mouth daily.    . fish oil-omega-3 fatty acids 1000 MG capsule Take 1 g by mouth daily.     Marland Kitchen Hyaluronic Acid-Vitamin C (HYALURONIC ACID PO) Take 100 mg by mouth daily.    . Lecithin 1200 MG CAPS Take 1,200 mg by mouth daily.     Marland Kitchen losartan (COZAAR) 50 MG tablet Take 1 tablet (50 mg total) by mouth daily. 90 tablet 1  . Multiple Vitamin (MULTIVITAMIN WITH MINERALS) TABS tablet Take 1 tablet by mouth daily.    . mupirocin ointment (BACTROBAN) 2 % Apply 1 application topically 2 (two) times daily as needed (inner nose).     Marland Kitchen OVER THE COUNTER MEDICATION Take 20 mg by mouth daily. Lutigold    . pyridOXINE (VITAMIN B-6) 50 MG tablet Take 50 mg by mouth daily.    Marland Kitchen tretinoin (RETIN-A) 0.025 % cream Apply 1 application topically every morning.     . Turmeric 450 MG CAPS Take 450 mg  by mouth daily.     Marland Kitchen UBIQUINOL PO Take 100 mg by mouth daily.     Marland Kitchen VITAMIN A PO Take 1 tablet by mouth daily.    . vitamin C (ASCORBIC ACID) 500 MG tablet Take 500 mg by mouth daily.     . vitamin E 400 UNIT capsule Take 400 Units by mouth every morning. Reported on 11/30/2015    . Zinc 25 MG TABS Take 25 mg by mouth daily.     No current facility-administered medications for this visit.   Facility-Administered Medications Ordered in Other Visits  Medication Dose Route Frequency Provider Last Rate Last Admin  . topical emolient (BIAFINE) emulsion   Topical BID Kyung Rudd, MD   Given at 07/30/11 1405     Past Medical History:  Diagnosis Date  . Abscess of gallbladder   . 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 Basal cell carcinoma of skin   . Broken jaw ([place])    right lower   . Cholecystitis   . Coronary heart disease    has a stent  . CVA (cerebral infarction) 2007   "mild"  . Fatigue 07/26/2011  . History of cancer chemotherapy   . History of radiation therapy 06/18/2011- 08/03/2011   squamous cell tongue/69.96 Gy 35fx  .  Hypercholesteremia    under control  . Myocardial infarction ([place])    approx 2008 or 2009  . Numbness    right lower   . Numbness and tingling in left hand    numbess 2 fingers   . 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 Peripheral vascular disease ([place])   . PFO (patent foramen ovale)   . Prostate cancer ([place]) 2002  . Skin cancer    basal cell, squamous cell  . Squamous cell carcinoma of skin   . Stroke [facility])    appro in 08 or 09    Past Surgical History:  Procedure Laterality Date  . CHOLECYSTECTOMY  04/2009   3 surgeries involved: lap chole x2 laparotomy  . CORONARY ARTERY BYPASS GRAFT  1991   triple  . ELBOW SURGERY  42 years ago   removal left radial head due to hockey accident  . EXPLORATORY LAPAROTOMY  04/2010   Dr. Redmond Pulling (3rd surgery for gall bladder)  . INGUINAL HERNIA REPAIR Right 09/23/2014   Procedure: RIGHT  INGUINAL HERNIA REPAIR WITH MESH;  Surgeon: Jackolyn Confer, MD;  Location: [facility];  Service: General;  Laterality: Right;  . INGUINAL HERNIA REPAIR Left 09/30/2015   Procedure: HERNIA REPAIR LEFT INGUINAL ADULT;  Surgeon: Jackolyn Confer, MD;  Location: [facility];  Service: General;  Laterality: Left;  . INSERTION OF MESH Left 09/30/2015   Procedure: INSERTION OF MESH;  Surgeon: Jackolyn Confer, MD;  Location: [facility];  Service: General;  Laterality: Left;  Marland Kitchen MULTIPLE TOOTH EXTRACTIONS     prior to radiation  . PEG TUBE PLACEMENT  06/13/11 at [facility] IR   10/2011 removed  . PROSTATECTOMY  2002  . TONSILLECTOMY     x2: as child then 1985 for Left lingual tonsil removed  . TOTAL HIP ARTHROPLASTY Left 02/08/2016   Procedure: TOTAL HIP ARTHROPLASTY ANTERIOR APPROACH;  Surgeon: Frederik Pear, MD;  Location: [place];  Service: Orthopedics;  Laterality: Left;  . ULNAR NERVE TRANSPOSITION Left 10/16/2018   Procedure: ULNAR NERVE DECOMPRESSION/TRANSPOSITION;  Surgeon: [name], MD;  Location: [place];  Service: Orthopedics;  Laterality: Left;    Social History   Socioeconomic History  . Marital status: Married    Spouse name: Not on file  . Number of children: Not on file  . Years of education: Not on file  . Highest education level: Not on file  Occupational History  . Not on file  Tobacco Use  . Smoking status: Former Smoker    Packs/day: 0.25    Years: 5.00    Pack years: 1.25    Types: Cigarettes    Quit date: 03/20/1966    Years since quitting: 53.6  . Smokeless tobacco: Never Used  Substance and Sexual Activity  . Alcohol use: No  . Drug use: No  . Sexual activity: Not on file  Other Topics Concern  . Not on file  Social History Narrative  . Not on file   Social Determinants of Health   Financial Resource Strain:   . Difficulty of Paying Living Expenses: Not on file  Food Insecurity:   . Worried About Charity fundraiser in the Last Year: Not on file  . Ran Out of Food in the Last  Year: Not on file  Transportation Needs:   . Lack of Transportation (Medical): Not on file  . Lack of Transportation (Non-Medical): Not on file  Physical Activity:   . Days of Exercise per Week: Not on  file  . Minutes of Exercise per Session: Not on file  Stress:   . Feeling of Stress : Not on file  Social Connections:   . Frequency of Communication with Friends and Family: Not on file  . Frequency of Social Gatherings with Friends and Family: Not on file  . Attends Religious Services: Not on file  . Active Member of Clubs or Organizations: Not on file  . Attends Archivist Meetings: Not on file  . Marital Status: Not on file  Intimate Partner Violence:   . Fear of Current or Ex-Partner: Not on file  . Emotionally Abused: Not on file  . Physically Abused: Not on file  . Sexually Abused: Not on file    Family History  Problem Relation Age of Onset  . Stroke Mother   . Coronary artery disease Brother        had bypass    ROS: no fevers or chills, productive cough, hemoptysis, dysphasia, odynophagia, melena, hematochezia, dysuria, hematuria, rash, seizure activity, orthopnea, PND, pedal edema, claudication. Remaining systems are negative.  Physical Exam: Well-developed well-nourished in no acute distress.  Skin is warm and dry.  HEENT is normal.  Neck is supple.  Chest is clear to auscultation with normal expansion.  Cardiovascular exam is regular rate and rhythm.  Abdominal exam nontender or distended. No masses palpated. Extremities show no edema. neuro grossly intact  ECG-at a rate of 69, occasional PVC, no ST changes.  Personally reviewed  A/P  1 coronary artery disease-patient denies chest pain.  Continue medical therapy with aspirin and statin.  2 thoracic aortic aneurysm-plan follow-up CTA July 2021.  3 hypertension-blood pressure elevated.  Increase losartan to 100 mg daily.  Check potassium and renal function in 1 week.  4 hyperlipidemia-continue  statin.  Check lipids and liver.  5 prior CVA-history of patent foramen ovale but no recurrent episodes.  Continue aspirin.  Kirk Ruths, MD

## 2019-10-30 DIAGNOSIS — I10 Essential (primary) hypertension: Secondary | ICD-10-CM | POA: Diagnosis not present

## 2019-10-30 DIAGNOSIS — E785 Hyperlipidemia, unspecified: Secondary | ICD-10-CM | POA: Diagnosis not present

## 2019-11-03 ENCOUNTER — Encounter: Payer: Self-pay | Admitting: *Deleted

## 2019-11-03 ENCOUNTER — Other Ambulatory Visit: Payer: Self-pay | Admitting: *Deleted

## 2019-11-03 DIAGNOSIS — E785 Hyperlipidemia, unspecified: Secondary | ICD-10-CM

## 2019-11-03 MED ORDER — ATORVASTATIN CALCIUM 80 MG PO TABS: 80.0000 mg | ORAL_TABLET | Freq: Every day | ORAL | 3 refills | Status: DC

## 2019-11-04 DIAGNOSIS — C44311 Basal cell carcinoma of skin of nose: Secondary | ICD-10-CM | POA: Diagnosis not present

## 2019-11-04 LAB — COMPREHENSIVE METABOLIC PANEL
ALT: 18 IU/L (ref 0–44)
AST: 25 IU/L (ref 0–40)
Albumin/Globulin Ratio: 1.9 (ref 1.2–2.2)
Albumin: 4.1 g/dL (ref 3.7–4.7)
Alkaline Phosphatase: 94 IU/L (ref 39–117)
BUN/Creatinine Ratio: 21 (ref 10–24)
BUN: 24 mg/dL (ref 8–27)
Bilirubin Total: 0.6 mg/dL (ref 0.0–1.2)
CO2: 23 mmol/L (ref 20–29)
Calcium: 9.5 mg/dL (ref 8.6–10.2)
Chloride: 106 mmol/L (ref 96–106)
Creatinine, Ser: 1.12 mg/dL (ref 0.76–1.27)
GFR calc Af Amer: 72 mL/min/{1.73_m2} (ref >59)
GFR calc non Af Amer: 63 mL/min/{1.73_m2} (ref >59)
Globulin, Total: 2.2 g/dL (ref 1.5–4.5)
Glucose: 86 mg/dL (ref 65–99)
Potassium: 4.3 mmol/L (ref 3.5–5.2)
Total Protein: 6.3 g/dL (ref 6.0–8.5)

## 2019-11-04 LAB — LIPID PANEL
Chol/HDL Ratio: 2.3 ratio (ref 0.0–5.0)
Cholesterol, Total: 168 mg/dL (ref 100–199)
HDL: 73 mg/dL (ref >39)
LDL Chol Calc (NIH): 82 mg/dL (ref 0–99)
Triglycerides: 70 mg/dL (ref 0–149)
VLDL Cholesterol Cal: 13 mg/dL (ref 5–40)

## 2019-12-03 DIAGNOSIS — C44311 Basal cell carcinoma of skin of nose: Secondary | ICD-10-CM | POA: Diagnosis not present

## 2019-12-07 DIAGNOSIS — J029 Acute pharyngitis, unspecified: Secondary | ICD-10-CM | POA: Diagnosis not present

## 2019-12-07 DIAGNOSIS — Z85810 Personal history of malignant neoplasm of tongue: Secondary | ICD-10-CM | POA: Diagnosis not present

## 2019-12-22 DIAGNOSIS — Z923 Personal history of irradiation: Secondary | ICD-10-CM | POA: Diagnosis not present

## 2019-12-22 DIAGNOSIS — Z85819 Personal history of malignant neoplasm of unspecified site of lip, oral cavity, and pharynx: Secondary | ICD-10-CM | POA: Insufficient documentation

## 2019-12-22 DIAGNOSIS — J029 Acute pharyngitis, unspecified: Secondary | ICD-10-CM | POA: Diagnosis not present

## 2019-12-23 ENCOUNTER — Other Ambulatory Visit: Payer: Self-pay | Admitting: Otolaryngology

## 2019-12-23 DIAGNOSIS — C029 Malignant neoplasm of tongue, unspecified: Secondary | ICD-10-CM

## 2019-12-23 DIAGNOSIS — Z923 Personal history of irradiation: Secondary | ICD-10-CM

## 2019-12-23 DIAGNOSIS — R1314 Dysphagia, pharyngoesophageal phase: Secondary | ICD-10-CM

## 2020-01-08 ENCOUNTER — Ambulatory Visit
Admission: RE | Admit: 2020-01-08 | Discharge: 2020-01-08 | Disposition: A | Discharge status: Home / Self Care | Payer: Medicare Other | Source: Ambulatory Visit | Attending: Otolaryngology | Admitting: Otolaryngology

## 2020-01-08 DIAGNOSIS — J029 Acute pharyngitis, unspecified: Secondary | ICD-10-CM | POA: Diagnosis not present

## 2020-01-08 DIAGNOSIS — Z923 Personal history of irradiation: Secondary | ICD-10-CM

## 2020-01-08 DIAGNOSIS — R1314 Dysphagia, pharyngoesophageal phase: Secondary | ICD-10-CM

## 2020-01-08 DIAGNOSIS — C029 Malignant neoplasm of tongue, unspecified: Secondary | ICD-10-CM

## 2020-01-08 MED ORDER — IOPAMIDOL (ISOVUE-300) INJECTION 61%
75.0000 mL | Freq: Once | INTRAVENOUS | Status: AC | PRN
  Administered 2020-01-08: 75 mL via INTRAVENOUS

## 2020-01-11 ENCOUNTER — Other Ambulatory Visit: Payer: Self-pay | Admitting: Adult Health

## 2020-01-22 DIAGNOSIS — Z85819 Personal history of malignant neoplasm of unspecified site of lip, oral cavity, and pharynx: Secondary | ICD-10-CM | POA: Diagnosis not present

## 2020-01-22 DIAGNOSIS — J029 Acute pharyngitis, unspecified: Secondary | ICD-10-CM | POA: Diagnosis not present

## 2020-01-22 DIAGNOSIS — J387 Other diseases of larynx: Secondary | ICD-10-CM | POA: Diagnosis not present

## 2020-01-22 DIAGNOSIS — Z923 Personal history of irradiation: Secondary | ICD-10-CM | POA: Diagnosis not present

## 2020-02-08 ENCOUNTER — Telehealth: Payer: Self-pay | Admitting: *Deleted

## 2020-02-08 NOTE — H&P (Signed)
HPI:   Jason Noble is a 79 y.o. male who presents as a consult Patient.   Referring Provider: Ernestine Conrad*  Chief complaint: Sore throat.  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 He has had significant dry mouth and other radiation side effects over the years but has done remarkably well. He has maintained his weight. For the past 3 weeks he has had a sore throat that has been pretty consistent. Antibiotics did not help. He denies ear pain. He is not having difficulty swallowing. The sore throat does not lateralize to either side. He is not a smoker or drinker. We do not know the HPV status of the previous cancer.  PMH/Meds/All/SocHx/FamHx/ROS:   Past Medical History:  Diagnosis Date  . Cancer [facility])   Past Surgical History:  Procedure Laterality Date  . TOTAL HIP REPLACEMENT   No family history of bleeding disorders, wound healing problems or difficulty with anesthesia.   Social History   Socioeconomic History  . Marital status: Married  Spouse name: Not on file  . Number of children: Not on file  . Years of education: Not on file  . Highest education level: Not on file  Occupational History  . Not on file  Tobacco Use  . Smoking status: Former Smoker  Quit date: 1967  Years since quitting: 54.2  . Smokeless tobacco: Never Used  Substance and Sexual Activity  . Alcohol use: Not on file  . Drug use: Not on file  . Sexual activity: Not on file  Other Topics Concern  . Not on file  Social History Narrative  . Not on file   Social Determinants of Health   Financial Resource Strain:  . Difficulty of Paying Living Expenses:  Food Insecurity:  . Worried About Charity fundraiser in the Last Year:  . Arboriculturist in the Last Year:  Transportation Needs:  . Film/video editor (Medical):  Marland Kitchen Lack of Transportation (Non-Medical):  Physical Activity:  . Days of Exercise per Week:  . Minutes of  Exercise per Session:  Stress:  . Feeling of Stress :  Social Connections:  . Frequency of Communication with Friends and Family:  . Frequency of Social Gatherings with Friends and Family:  . Attends Religious Services:  . Active Member of Clubs or Organizations:  . Attends Archivist Meetings:  Marland Kitchen Marital Status:   Current Outpatient Medications:  . amLODIPine (NORVASC) 10 MG tablet, , Disp: , Rfl:  . ascorbic acid, vitamin C, (VITAMIN C) 500 MG tablet, Take 500 mg by mouth., Disp: , Rfl:  . atorvastatin (LIPITOR) 80 MG tablet, , Disp: , Rfl:  . calcium carbonate (OS-CAL) 600 mg calcium (1,500 mg) tablet, Take 1,200 mg by mouth., Disp: , Rfl:  . cholecalciferol (VITAMIN D3) 1000 UNIT Tab, Take 1,000 Units by mouth., Disp: , Rfl:  . docosahexaenoic acid-epa 120-180 mg Cap, Take by mouth., Disp: , Rfl:  . lecithin, soy 1,200 mg Cap, Take 1,200 mg by mouth., Disp: , Rfl:  . losartan (COZAAR) 100 MG tablet, , Disp: , Rfl:  . MULTIVIT 33-MTFOLATE-NAC-CHROM ORAL, Take by mouth., Disp: , Rfl:  . pyridoxine, vitamin B6, (VITAMIN B-6) 50 MG tablet, Take 50 mg by mouth., Disp: , Rfl:  . vitamin B complex (B COMPLEX 1 ORAL), Take 50 mg by mouth., Disp: , Rfl:  . vitamin E 400 UNIT capsule, Take 400 Units by mouth., Disp: , Rfl:  . zinc  sulfate 25 mg zinc (110 mg) Tab, Take 25 mg by mouth., Disp: , Rfl:   A complete ROS was performed with pertinent positives/negatives noted in the HPI. The remainder of the ROS are negative.   Physical Exam:   BP 138/84  Pulse 76  Temp 97.1 F (36.2 C)  Ht 1.753 m (5\' 9" )  Wt 83.5 kg (184 lb)  BMI 27.17 kg/m   General: Healthy and alert, in no distress, breathing easily. Normal affect. In a pleasant mood. Head: Normocephalic, atraumatic. No masses, or scars. Eyes: Pupils are equal, and reactive to light. Vision is grossly intact. No spontaneous or gaze nystagmus. Ears: Ear canals are clear. Tympanic membranes are intact, with normal landmarks  and the middle ears are clear and healthy. Hearing: Grossly normal. Nose: Nasal cavities are clear with healthy mucosa, no polyps or exudate. Airways are patent. Face: No masses or scars, facial nerve function is symmetric. Oral Cavity: No mucosal abnormalities are noted. Tongue with normal mobility. Dentition appears healthy. Oropharynx: Tonsils are absent. There are no mucosal masses identified. Tongue base appears normal and healthy. Indirect exam of the oropharynx and hypopharynx reveals edema of the epiglottis and supraglottic larynx, consistent with radiation changes. It is all symmetric. There is a pale yellowish small lesion identified in the right side vallecula. Larynx/Hypopharynx: indirect exam reveals healthy, mobile vocal cords, without mucosal lesions in the hypopharynx or larynx. Chest: Deferred Neck: No palpable masses, no cervical adenopathy, no thyroid nodules or enlargement. Neuro: Cranial nerves II-XII with normal function. Balance: Normal gate. Other findings: none.  Independent Review of Additional Tests or Records:  none  Procedures:  none  Impression & Plans:  Possible mass in the right vallecula. Recommend CT imaging of the head and neck. We will discuss the possible need for biopsy following that.

## 2020-02-08 NOTE — Telephone Encounter (Signed)
   [facility] Pre-operative Risk Assessment     Request for surgical clearance:  1. What type of surgery is being performed? Direct Larynogoscopy   2. When is this surgery scheduled? 02/17/20   3. What type of clearance is required (medical clearance vs. Pharmacy clearance to hold med vs. Both)? Both  4. Are there any medications that need to be held prior to surgery and how long? Aspirin (they also included Atorvastatin and Losartan to hold)  5. Practice name and name of physician performing surgery? [facility]   6. What is the office phone number? [phone]   7.   What is the office fax number? [phone]  8.   Anesthesia type (None, local, MAC, general) ? None listed   Jason Noble 02/08/2020, 3:07 PM  _________________________________________________________________   (provider comments below)

## 2020-02-09 DIAGNOSIS — L570 Actinic keratosis: Secondary | ICD-10-CM | POA: Diagnosis not present

## 2020-02-09 DIAGNOSIS — C44329 Squamous cell carcinoma of skin of other parts of face: Secondary | ICD-10-CM | POA: Diagnosis not present

## 2020-02-09 DIAGNOSIS — Z411 Encounter for cosmetic surgery: Secondary | ICD-10-CM | POA: Diagnosis not present

## 2020-02-09 DIAGNOSIS — D485 Neoplasm of uncertain behavior of skin: Secondary | ICD-10-CM | POA: Diagnosis not present

## 2020-02-09 DIAGNOSIS — D171 Benign lipomatous neoplasm of skin and subcutaneous tissue of trunk: Secondary | ICD-10-CM | POA: Diagnosis not present

## 2020-02-09 DIAGNOSIS — L578 Other skin changes due to chronic exposure to nonionizing radiation: Secondary | ICD-10-CM | POA: Diagnosis not present

## 2020-02-10 NOTE — Pre-Procedure Instructions (Signed)
Your procedure is scheduled on Wednesday, June 2nd, 2021 from 11:30 AM- 12:11 PM.  Report to [facility] Main Entrance "A" at 09:30 A.M., and check in at the Admitting office.  Call this number if you have problems the morning of surgery:  [phone]  Call [phone] if you have any questions prior to your surgery date Monday-Friday 8am-4pm.    Remember:  Do not eat or drink after midnight the night before your surgery.     Take these medicines the morning of surgery with A SIP OF WATER: amLODipine (NORVASC) atorvastatin (LIPITOR)  *Follow your surgeon's instructions on when to stop Aspirin.  If no instructions were given by your surgeon then you will need to call the office to get those instructions.*  As of today, STOP taking any Aspirin containing products, Aleve, Naproxen, Ibuprofen, Motrin, Advil, Goody's, BC's, all herbal medications, fish oil, and all vitamins.                      Do not wear jewelry.            Do not wear lotions, powders, colognes, or deodorant.            Men may shave face and neck.            Do not bring valuables to the hospital.            [facility] is not responsible for any belongings or valuables.  Do NOT Smoke (Tobacco/Vapping) or drink Alcohol 24 hours prior to your procedure.  If you use a CPAP at night, you may bring all equipment for your overnight stay.   Contacts, glasses, dentures or bridgework may not be worn into surgery.      For patients admitted to the hospital, discharge time will be determined by your treatment team.   Patients discharged the day of surgery will not be allowed to drive home, and someone needs to stay with them for 24 hours.    Special instructions:   [place]- Preparing For Surgery  Before surgery, you can play an important role. Because skin is not sterile, your skin needs to be as free of germs as possible. You can reduce the number of germs on your skin by washing with CHG (chlorahexidine  gluconate) Soap before surgery.  CHG is an antiseptic cleaner which kills germs and bonds with the skin to continue killing germs even after washing.    Oral Hygiene is also important to reduce your risk of infection.  Remember - BRUSH YOUR TEETH THE MORNING OF SURGERY WITH YOUR REGULAR TOOTHPASTE  Please do not use if you have an allergy to CHG or antibacterial soaps. If your skin becomes reddened/irritated stop using the CHG.  Do not shave (including legs and underarms) for at least 48 hours prior to first CHG shower. It is OK to shave your face.  Please follow these instructions carefully.   1. Shower the NIGHT BEFORE SURGERY and the MORNING OF SURGERY with CHG Soap.   2. If you chose to wash your hair, wash your hair first as usual with your normal shampoo.  3. After you shampoo, rinse your hair and body thoroughly to remove the shampoo.  4. Use CHG as you would any other liquid soap. You can apply CHG directly to the skin and wash gently with a scrungie or a clean washcloth.   5. Apply the CHG Soap to your body ONLY FROM THE NECK DOWN.  Do not use on open wounds or open sores. Avoid contact with your eyes, ears, mouth and genitals (private parts). Wash Face and genitals (private parts)  with your normal soap.   6. Wash thoroughly, paying special attention to the area where your surgery will be performed.  7. Thoroughly rinse your body with warm water from the neck down.  8. DO NOT shower/wash with your normal soap after using and rinsing off the CHG Soap.  9. Pat yourself dry with a CLEAN TOWEL.  10. Wear CLEAN PAJAMAS to bed the night before surgery, wear comfortable clothes the morning of surgery  11. Place CLEAN SHEETS on your bed the night of your first shower and DO NOT SLEEP WITH PETS.   Day of Surgery:   Do not apply any deodorants/lotions.  Please wear clean clothes to the hospital/surgery center.   Remember to brush your teeth WITH YOUR REGULAR TOOTHPASTE.   Please  read over the following fact sheets that you were given.

## 2020-02-11 ENCOUNTER — Encounter (HOSPITAL_COMMUNITY): Payer: Self-pay

## 2020-02-11 ENCOUNTER — Encounter (HOSPITAL_COMMUNITY)
Admission: RE | Admit: 2020-02-11 | Discharge: 2020-02-11 | Disposition: A | Discharge status: Home / Self Care | Payer: Medicare Other | Source: Ambulatory Visit | Attending: Otolaryngology | Admitting: Otolaryngology

## 2020-02-11 ENCOUNTER — Other Ambulatory Visit: Payer: Self-pay

## 2020-02-11 DIAGNOSIS — Z951 Presence of aortocoronary bypass graft: Secondary | ICD-10-CM | POA: Diagnosis not present

## 2020-02-11 DIAGNOSIS — I739 Peripheral vascular disease, unspecified: Secondary | ICD-10-CM | POA: Diagnosis not present

## 2020-02-11 DIAGNOSIS — J387 Other diseases of larynx: Secondary | ICD-10-CM | POA: Diagnosis not present

## 2020-02-11 DIAGNOSIS — I2510 Atherosclerotic heart disease of native coronary artery without angina pectoris: Secondary | ICD-10-CM | POA: Insufficient documentation

## 2020-02-11 DIAGNOSIS — I10 Essential (primary) hypertension: Secondary | ICD-10-CM | POA: Insufficient documentation

## 2020-02-11 DIAGNOSIS — I252 Old myocardial infarction: Secondary | ICD-10-CM | POA: Diagnosis not present

## 2020-02-11 DIAGNOSIS — Z8673 Personal history of transient ischemic attack (TIA), and cerebral infarction without residual deficits: Secondary | ICD-10-CM | POA: Diagnosis not present

## 2020-02-11 DIAGNOSIS — Z87891 Personal history of nicotine dependence: Secondary | ICD-10-CM | POA: Diagnosis not present

## 2020-02-11 DIAGNOSIS — Z01812 Encounter for preprocedural laboratory examination: Secondary | ICD-10-CM | POA: Insufficient documentation

## 2020-02-11 HISTORY — DX: Essential (primary) hypertension: I10

## 2020-02-11 LAB — CBC
HCT: 41.9 % (ref 39.0–52.0)
Hemoglobin: 13.4 g/dL (ref 13.0–17.0)
MCH: 29.6 pg (ref 26.0–34.0)
MCHC: 32 g/dL (ref 30.0–36.0)
MCV: 92.5 fL (ref 80.0–100.0)
Platelets: 160 10*3/uL (ref 150–400)
RBC: 4.53 MIL/uL (ref 4.22–5.81)
RDW: 13.7 % (ref 11.5–15.5)
WBC: 5.6 10*3/uL (ref 4.0–10.5)
nRBC: 0 % (ref 0.0–0.2)

## 2020-02-11 LAB — BASIC METABOLIC PANEL
Anion gap: 8 (ref 5–15)
BUN: 30 mg/dL — ABNORMAL HIGH (ref 8–23)
CO2: 25 mmol/L (ref 22–32)
Calcium: 9.1 mg/dL (ref 8.9–10.3)
Chloride: 107 mmol/L (ref 98–111)
Creatinine, Ser: 1.15 mg/dL (ref 0.61–1.24)
GFR calc Af Amer: >60 mL/min (ref >60)
GFR calc non Af Amer: >60 mL/min (ref >60)
Glucose, Bld: 169 mg/dL — ABNORMAL HIGH (ref 70–99)
Potassium: 4.1 mmol/L (ref 3.5–5.1)
Sodium: 140 mmol/L (ref 135–145)

## 2020-02-11 LAB — SURGICAL PCR SCREEN
MRSA, PCR: NEGATIVE
Staphylococcus aureus: NEGATIVE

## 2020-02-11 NOTE — Progress Notes (Signed)
PCP -  Dr. Shirline Frees Cardiologist - Dr. Kirk Ruths  PPM/ICD - denies  Chest x-ray - N/A EKG - 10/21/2019 Stress Test - denies ECHO - 04/10/18 Cardiac Cath - denies  Sleep Study - N/A CPAP - N/A  DM: denies  Blood Thinner Instructions: N/A Aspirin Instructions: Patient instructed to call surgeon's office today after PAT appointment   ERAS Protcol - No  COVID TEST- Scheduled for 02/16/2020. Patient verbalized understanding of self-quarantine instructions, appointment time and place.  Anesthesia review: YES, cardiac hx, clearance  Patient denies shortness of breath, fever, cough and chest pain at PAT appointment  All instructions explained to the patient, with a verbal understanding of the material. Patient agrees to go over the instructions while at home for a better understanding. Patient also instructed to self quarantine after being tested for COVID-19. The opportunity to ask questions was provided.

## 2020-02-12 NOTE — Progress Notes (Signed)
Anesthesia Chart Review:  Follows with cardiology for history of CVA 2007 (history of patent foramen ovale but no recurrent episodes), CAD; s/p CABG in 1991 and SVG BMS in 2008. Echocardiogram April 2016 showed normal LV systolic function, grade 1 diastolic dysfunction, mild left atrial enlargement and mild tricuspid regurgitation. The ascending aorta measured 4.5 cm.Last echocardiogram July 2019 showed normal LV function, mild diastolic dysfunction, dilated aortic root at 4.6 cm, mild left atrial enlargement. CTA July 2020 showed decrease in size of previous 4 mm nodule in the left lower lobe. Thoracic ascending aorta measured 4.2 cm unchanged.  Last seen by Dr. Stanford Breed on 10/21/2019 per note patient denied chest pain, recommended continue medical therapy with aspirin and statin. 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  Recently evaluated by Dr. Constance Holster for persistent sore throat.  Per last office visit note 01/22/2020, "We discussed the results of the CT which was basically negative. He continues to have the right-sided sore throat. On indirect exam today there is a persistent small ulceration that appears now to be on the apex of the area epiglottic fold adjacent to the epiglottis. The remainder of the larynx looks normal. At this point recommend we proceed with direct laryngoscopy and biopsy."  Preop labs reviewed, unremarkable.  Dr. Janeice Robinson office reached out to cardiology for clearance, however they have not been able to get in touch with patient. If they are not able to speak with patient for clearance surgery will be postponed.   EKG 10/21/2019: Sinus rhythm with occasional premature ventricular complexes.  Rate 69.  CT soft tissue neck 01/08/2020: IMPRESSION: No evidence residual or recurrent neoplastic disease. No evidence of new neoplastic disease. Post treatment changes with atrophy or postsurgical absence of the salivary glands  and musculature at the floor of the mouth on the right as described.  CTA chest 04/03/2019: IMPRESSION: No acute cardiopulmonary disease. Decrease in size of the previously noted 4 mm nodule over the left lower lobe which is therefore benign. No new nodules identified.  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 This is unchanged from 2017. Recommend annual imaging followup by CTA or MRA. This recommendation follows 2010 ACCF/AHA/AATS/ACR/ASA/SCA/SCAI/SIR/STS/SVM Guidelines for the Diagnosis and Management of Patients with Thoracic Aortic Disease. Circulation. 2010; [id number]. Aortic aneurysm NOS (ICD10-I71.9).  Aortic Atherosclerosis (ICD10-I70.0). Atherosclerotic coronary artery disease.  Small left renal cysts unchanged.  Stable mild compression fracture over the region of the thoracolumbar junction.  TTE 04/10/2018: Study Conclusions  - Left ventricle: The cavity size was normal. Wall thickness was increased in a pattern of mild LVH. Systolic function was normal. The estimated ejection fraction was in the range of 55% to 60%. Wall motion was normal; there were no regional wall motion abnormalities. Doppler parameters are consistent with abnormal left ventricular relaxation (grade 1 diastolic dysfunction). - Aortic root: The aortic root was moderately dilated. - Mitral valve: Calcified annulus. - Left atrium: The atrium was mildly dilated. - Atrial septum: There was an atrial septal aneurysm.  Impressions:  - Normal LV systolic function; mild LVH; mild diastolic dysfunction; moderately dilated aortic root (4.6 cm); suggest CTA or MRA to further assess; mild LAE; atrial septal aneurysm.

## 2020-02-16 ENCOUNTER — Other Ambulatory Visit (HOSPITAL_COMMUNITY)
Admission: RE | Admit: 2020-02-16 | Discharge: 2020-02-16 | Disposition: A | Discharge status: Home / Self Care | Payer: Medicare Other | Source: Ambulatory Visit | Attending: Otolaryngology | Admitting: Otolaryngology

## 2020-02-16 DIAGNOSIS — Z20822 Contact with and (suspected) exposure to covid-19: Secondary | ICD-10-CM | POA: Diagnosis not present

## 2020-02-16 DIAGNOSIS — Z01812 Encounter for preprocedural laboratory examination: Secondary | ICD-10-CM | POA: Diagnosis not present

## 2020-02-16 LAB — SARS CORONAVIRUS 2 (TAT 6-24 HRS): SARS Coronavirus 2: NEGATIVE

## 2020-02-16 NOTE — Telephone Encounter (Signed)
Left a voice message for the assistant of Dr. Constance Holster at [facility] to give me a call back to verify if their office received the cardiac clearance from our office for patient is scheduled for procedure tomorrow 02/17/20.

## 2020-02-16 NOTE — Telephone Encounter (Signed)
   Primary Cardiologist: Kirk Ruths, MD  Chart reviewed as part of pre-operative protocol coverage. Patient was contacted 02/16/2020 in reference to pre-operative risk assessment for pending surgery as outlined below.  Donna N Pressey was last seen on 10/21/2019 by Dr. Stanford Breed.  Since that day, DUILIO STETLER has done well.  Therefore, based on ACC/AHA guidelines, the patient would be at acceptable risk for the planned procedure without further cardiovascular testing.   I will route this recommendation to the requesting party via Epic fax function and remove from pre-op pool. Please call with questions.  Mr. Ta did finally back to our office and I spoke with him directly. He has been doing well from cardiac perspective. He is cleared for the procedure. His last dose of aspirin was in the morning of 5/31, he will hold aspirin until after his procedure.   [place], [place] 02/16/2020, 2:56 PM

## 2020-02-16 NOTE — Telephone Encounter (Signed)
Please inform the requesting provider that we have tried to reach Jason Noble multiple times for preop clearance without success. I called both of his phone number today and left him another message for his to call back.

## 2020-02-17 ENCOUNTER — Encounter (HOSPITAL_COMMUNITY): Payer: Self-pay | Admitting: Otolaryngology

## 2020-02-17 ENCOUNTER — Ambulatory Visit (HOSPITAL_COMMUNITY): Payer: Medicare Other | Admitting: Anesthesiology

## 2020-02-17 ENCOUNTER — Ambulatory Visit (HOSPITAL_COMMUNITY): Payer: Medicare Other | Admitting: Emergency Medicine

## 2020-02-17 ENCOUNTER — Other Ambulatory Visit: Payer: Self-pay

## 2020-02-17 ENCOUNTER — Ambulatory Visit (HOSPITAL_COMMUNITY)
Admission: RE | Admit: 2020-02-17 | Discharge: 2020-02-17 | Disposition: A | Discharge status: Home / Self Care | Payer: Medicare Other | Attending: Otolaryngology | Admitting: Otolaryngology

## 2020-02-17 ENCOUNTER — Encounter (HOSPITAL_COMMUNITY)
Admission: RE | Disposition: A | Discharge status: Home / Self Care | Payer: Self-pay | Source: Home / Self Care | Attending: Otolaryngology

## 2020-02-17 DIAGNOSIS — I739 Peripheral vascular disease, unspecified: Secondary | ICD-10-CM | POA: Insufficient documentation

## 2020-02-17 DIAGNOSIS — Z85810 Personal history of malignant neoplasm of tongue: Secondary | ICD-10-CM | POA: Insufficient documentation

## 2020-02-17 DIAGNOSIS — I2510 Atherosclerotic heart disease of native coronary artery without angina pectoris: Secondary | ICD-10-CM | POA: Insufficient documentation

## 2020-02-17 DIAGNOSIS — Z9221 Personal history of antineoplastic chemotherapy: Secondary | ICD-10-CM | POA: Diagnosis not present

## 2020-02-17 DIAGNOSIS — Z923 Personal history of irradiation: Secondary | ICD-10-CM | POA: Diagnosis not present

## 2020-02-17 DIAGNOSIS — Z87891 Personal history of nicotine dependence: Secondary | ICD-10-CM | POA: Insufficient documentation

## 2020-02-17 DIAGNOSIS — J387 Other diseases of larynx: Secondary | ICD-10-CM | POA: Diagnosis not present

## 2020-02-17 DIAGNOSIS — Z8673 Personal history of transient ischemic attack (TIA), and cerebral infarction without residual deficits: Secondary | ICD-10-CM | POA: Diagnosis not present

## 2020-02-17 DIAGNOSIS — I10 Essential (primary) hypertension: Secondary | ICD-10-CM | POA: Insufficient documentation

## 2020-02-17 DIAGNOSIS — G5692 Unspecified mononeuropathy of left upper limb: Secondary | ICD-10-CM | POA: Insufficient documentation

## 2020-02-17 DIAGNOSIS — C321 Malignant neoplasm of supraglottis: Secondary | ICD-10-CM | POA: Insufficient documentation

## 2020-02-17 DIAGNOSIS — E785 Hyperlipidemia, unspecified: Secondary | ICD-10-CM | POA: Diagnosis not present

## 2020-02-17 DIAGNOSIS — I639 Cerebral infarction, unspecified: Secondary | ICD-10-CM | POA: Diagnosis not present

## 2020-02-17 DIAGNOSIS — J029 Acute pharyngitis, unspecified: Secondary | ICD-10-CM | POA: Diagnosis not present

## 2020-02-17 DIAGNOSIS — Z85819 Personal history of malignant neoplasm of unspecified site of lip, oral cavity, and pharynx: Secondary | ICD-10-CM | POA: Diagnosis not present

## 2020-02-17 DIAGNOSIS — Z79899 Other long term (current) drug therapy: Secondary | ICD-10-CM | POA: Diagnosis not present

## 2020-02-17 DIAGNOSIS — I252 Old myocardial infarction: Secondary | ICD-10-CM | POA: Diagnosis not present

## 2020-02-17 HISTORY — PX: DIRECT LARYNGOSCOPY: SHX5326

## 2020-02-17 SURGERY — LARYNGOSCOPY, DIRECT
Anesthesia: General | Site: Throat

## 2020-02-17 MED ORDER — ONDANSETRON HCL 4 MG/2ML IJ SOLN
INTRAMUSCULAR | Status: AC
  Filled 2020-02-17: qty 2

## 2020-02-17 MED ORDER — ONDANSETRON HCL 4 MG/2ML IJ SOLN
INTRAMUSCULAR | Status: DC | PRN
Start: 2020-02-17 — End: 2020-02-17
  Administered 2020-02-17: 4 mg via INTRAVENOUS

## 2020-02-17 MED ORDER — SUGAMMADEX SODIUM 200 MG/2ML IV SOLN
INTRAVENOUS | Status: DC | PRN
  Administered 2020-02-17: 326.4 mg via INTRAVENOUS

## 2020-02-17 MED ORDER — ONDANSETRON HCL 4 MG/2ML IJ SOLN: 4.0000 mg | Freq: Once | INTRAMUSCULAR | Status: DC | PRN

## 2020-02-17 MED ORDER — FENTANYL CITRATE (PF) 250 MCG/5ML IJ SOLN
INTRAMUSCULAR | Status: DC | PRN
  Administered 2020-02-17 (×2): 50 ug via INTRAVENOUS

## 2020-02-17 MED ORDER — ORAL CARE MOUTH RINSE: 15.0000 mL | Freq: Once | OROMUCOSAL | Status: AC

## 2020-02-17 MED ORDER — ROCURONIUM BROMIDE 10 MG/ML (PF) SYRINGE
PREFILLED_SYRINGE | INTRAVENOUS | Status: AC
  Filled 2020-02-17: qty 10

## 2020-02-17 MED ORDER — 0.9 % SODIUM CHLORIDE (POUR BTL) OPTIME
TOPICAL | Status: DC | PRN
  Administered 2020-02-17: 1000 mL

## 2020-02-17 MED ORDER — OXYCODONE HCL 5 MG/5ML PO SOLN: 5.0000 mg | Freq: Once | ORAL | Status: DC | PRN

## 2020-02-17 MED ORDER — STERILE WATER FOR IRRIGATION IR SOLN
Status: DC | PRN
  Administered 2020-02-17: 1000 mL

## 2020-02-17 MED ORDER — LACTATED RINGERS IV SOLN: INTRAVENOUS | Status: DC

## 2020-02-17 MED ORDER — EPHEDRINE SULFATE-NACL 50-0.9 MG/10ML-% IV SOSY
PREFILLED_SYRINGE | INTRAVENOUS | Status: DC | PRN
  Administered 2020-02-17: 5 mg via INTRAVENOUS

## 2020-02-17 MED ORDER — SUGAMMADEX SODIUM 200 MG/2ML IV SOLN
INTRAVENOUS | Status: DC | PRN
Start: 2020-02-17 — End: 2020-02-17

## 2020-02-17 MED ORDER — FENTANYL CITRATE (PF) 100 MCG/2ML IJ SOLN: 25.0000 ug | INTRAMUSCULAR | Status: DC | PRN

## 2020-02-17 MED ORDER — EPINEPHRINE HCL (NASAL) 0.1 % NA SOLN
NASAL | Status: DC | PRN
  Administered 2020-02-17: 30 mL via NASAL

## 2020-02-17 MED ORDER — OXYCODONE HCL 5 MG PO TABS: 5.0000 mg | ORAL_TABLET | Freq: Once | ORAL | Status: DC | PRN

## 2020-02-17 MED ORDER — DEXAMETHASONE SODIUM PHOSPHATE 10 MG/ML IJ SOLN
INTRAMUSCULAR | Status: DC | PRN
  Administered 2020-02-17: 10 mg via INTRAVENOUS

## 2020-02-17 MED ORDER — EPINEPHRINE HCL (NASAL) 0.1 % NA SOLN
NASAL | Status: AC
  Filled 2020-02-17: qty 30

## 2020-02-17 MED ORDER — DEXAMETHASONE SODIUM PHOSPHATE 10 MG/ML IJ SOLN
INTRAMUSCULAR | Status: AC
  Filled 2020-02-17: qty 1

## 2020-02-17 MED ORDER — ROCURONIUM BROMIDE 10 MG/ML (PF) SYRINGE
PREFILLED_SYRINGE | INTRAVENOUS | Status: DC | PRN
Start: 2020-02-17 — End: 2020-02-17
  Administered 2020-02-17: 40 mg via INTRAVENOUS

## 2020-02-17 MED ORDER — FENTANYL CITRATE (PF) 250 MCG/5ML IJ SOLN
INTRAMUSCULAR | Status: AC
  Filled 2020-02-17: qty 5

## 2020-02-17 MED ORDER — LIDOCAINE HCL (CARDIAC) PF 100 MG/5ML IV SOSY
PREFILLED_SYRINGE | INTRAVENOUS | Status: DC | PRN
  Administered 2020-02-17: 60 mg via INTRATRACHEAL

## 2020-02-17 MED ORDER — LIDOCAINE-EPINEPHRINE 1 %-1:100000 IJ SOLN
INTRAMUSCULAR | Status: AC
  Filled 2020-02-17: qty 1

## 2020-02-17 MED ORDER — PROPOFOL 10 MG/ML IV BOLUS
INTRAVENOUS | Status: DC | PRN
  Administered 2020-02-17: 130 mg via INTRAVENOUS

## 2020-02-17 MED ORDER — SUGAMMADEX SODIUM 500 MG/5ML IV SOLN
INTRAVENOUS | Status: AC
  Filled 2020-02-17: qty 5

## 2020-02-17 MED ORDER — CHLORHEXIDINE GLUCONATE 0.12 % MT SOLN
15.0000 mL | Freq: Once | OROMUCOSAL | Status: AC
  Administered 2020-02-17: 15 mL via OROMUCOSAL
  Filled 2020-02-17: qty 15

## 2020-02-17 SURGICAL SUPPLY — 15 items
CANISTER SUCT 3000ML PPV (MISCELLANEOUS) ×2 IMPLANT
COVER BACK TABLE 60X90IN (DRAPES) ×2 IMPLANT
COVER MAYO STAND STRL (DRAPES) ×2 IMPLANT
COVER WAND RF STERILE (DRAPES) ×2 IMPLANT
DRAPE HALF SHEET 40X57 (DRAPES) ×2 IMPLANT
GAUZE 4X4 16PLY RFD (DISPOSABLE) ×1 IMPLANT
GLOVE ECLIPSE 7.5 STRL STRAW (GLOVE) ×2 IMPLANT
GUARD TEETH (MISCELLANEOUS) ×1 IMPLANT
KIT BASIN OR (CUSTOM PROCEDURE TRAY) ×2 IMPLANT
KIT TURNOVER KIT B (KITS) ×2 IMPLANT
NS IRRIG 1000ML POUR BTL (IV SOLUTION) ×2 IMPLANT
PAD ARMBOARD 7.5X6 YLW CONV (MISCELLANEOUS) ×4 IMPLANT
PATTIES SURGICAL .5 X3 (DISPOSABLE) ×1 IMPLANT
TOWEL GREEN STERILE (TOWEL DISPOSABLE) ×2 IMPLANT
TUBE CONNECTING 12X1/4 (SUCTIONS) ×2 IMPLANT

## 2020-02-17 NOTE — Anesthesia Postprocedure Evaluation (Signed)
Anesthesia Post Note  Patient: Jason Noble  Procedure(s) Performed: DIRECT LARYNGOSCOPY WITH BIOPSY (N/A Throat)     Patient location during evaluation: PACU Anesthesia Type: General Level of consciousness: awake and alert Pain management: pain level controlled Vital Signs Assessment: post-procedure vital signs reviewed and stable Respiratory status: spontaneous breathing, nonlabored ventilation and respiratory function stable Cardiovascular status: blood pressure returned to baseline and stable Postop Assessment: no apparent nausea or vomiting Anesthetic complications: no    Last Vitals:  Vitals:   02/17/20 1340 02/17/20 1355  BP: (!) 163/86 (!) 166/88  Pulse: 70 63  Resp: 16 14  Temp:  (!) 36.2 C  SpO2: 97% 94%    Last Pain:  Vitals:   02/17/20 1355  TempSrc:   PainSc: 0-No pain                 Audry Pili

## 2020-02-17 NOTE — Op Note (Signed)
OPERATIVE REPORT  DATE OF SURGERY: 02/17/2020  PATIENT:  Jason Noble,  79 y.o. male  PRE-OPERATIVE DIAGNOSIS:  LARYNGEAL MASS  POST-OPERATIVE DIAGNOSIS:  LARYNGEAL MASS  PROCEDURE:  Procedure(s): DIRECT LARYNGOSCOPY WITH BIOPSY  SURGEON:  Beckie Salts, MD  ASSISTANTS: none  ANESTHESIA:   General   EBL: 10 ml  DRAINS: none  LOCAL MEDICATIONS USED:  None  SPECIMEN: Biopsy right lingual surface epiglottis  COUNTS:  Correct  PROCEDURE DETAILS: The patient was taken to the operating room and placed on the operating table in the supine position. Following induction of general endotracheal anesthesia, the table was turned 90 and the patient was draped in a standard fashion.  Maxillary tooth protector was used throughout the case.  A Jako laryngoscope was introduced into the oral cavity used to inspect the larynx and hypopharynx.  There is diffuse radiation changes to the mucosa.  The cords are clear.  The piriform sinuses were clear.  Esophageal introitus and post cricoid area were clear.  Supraglottic larynx all clear except for the laryngeal surface of the epiglottis on the right side there is a small pustule on the surface and some friable mucosa surrounding that.  Biopsies were taken from this area.  These were sent for pathologic evaluation.  Topical adrenaline on pledgets was used for hemostasis.  Blood was suctioned out.  The scope was removed and the patient was handed back over to the care of anesthesia for extubation and awakening.    PATIENT DISPOSITION:  To PACU, stable

## 2020-02-17 NOTE — Discharge Instructions (Signed)
General Anesthesia, Adult, Care After This sheet gives you information about how to care for yourself after your procedure. Your health care provider may also give you more specific instructions. If you have problems or questions, contact your health care provider. What can I expect after the procedure? After the procedure, the following side effects are common:  Pain or discomfort at the IV site.  Nausea.  Vomiting.  Sore throat.  Trouble concentrating.  Feeling cold or chills.  Weak or tired.  Sleepiness and fatigue.  Soreness and body aches. These side effects can affect parts of the body that were not involved in surgery. Follow these instructions at home:  For at least 24 hours after the procedure:  Have a responsible adult stay with you. It is important to have someone help care for you until you are awake and alert.  Rest as needed.  Do not: ? Participate in activities in which you could fall or become injured. ? Drive. ? Use heavy machinery. ? Drink alcohol. ? Take sleeping pills or medicines that cause drowsiness. ? Make important decisions or sign legal documents. ? Take care of children on your own. Eating and drinking  Follow any instructions from your health care provider about eating or drinking restrictions.  When you feel hungry, start by eating small amounts of foods that are soft and easy to digest (bland), such as toast. Gradually return to your regular diet.  Drink enough fluid to keep your urine pale yellow.  If you vomit, rehydrate by drinking water, juice, or clear broth. General instructions  If you have sleep apnea, surgery and certain medicines can increase your risk for breathing problems. Follow instructions from your health care provider about wearing your sleep device: ? Anytime you are sleeping, including during daytime naps. ? While taking prescription pain medicines, sleeping medicines, or medicines that make you drowsy.  Return to  your normal activities as told by your health care provider. Ask your health care provider what activities are safe for you.  Take over-the-counter and prescription medicines only as told by your health care provider.  If you smoke, do not smoke without supervision.  Keep all follow-up visits as told by your health care provider. This is important. Contact a health care provider if:  You have nausea or vomiting that does not get better with medicine.  You cannot eat or drink without vomiting.  You have pain that does not get better with medicine.  You are unable to pass urine.  You develop a skin rash.  You have a fever.  You have redness around your IV site that gets worse. Get help right away if:  You have difficulty breathing.  You have chest pain.  You have blood in your urine or stool, or you vomit blood. Summary  After the procedure, it is common to have a sore throat or nausea. It is also common to feel tired.  Have a responsible adult stay with you for the first 24 hours after general anesthesia. It is important to have someone help care for you until you are awake and alert.  When you feel hungry, start by eating small amounts of foods that are soft and easy to digest (bland), such as toast. Gradually return to your regular diet.  Drink enough fluid to keep your urine pale yellow.  Return to your normal activities as told by your health care provider. Ask your health care provider what activities are safe for you. This information is not   intended to replace advice given to you by your health care provider. Make sure you discuss any questions you have with your health care provider. Document Revised: 09/06/2017 Document Reviewed: 04/19/2017 Elsevier Patient Education  2020 [place]. Dr. Constance Holster will call with results of the biopsy.

## 2020-02-17 NOTE — Interval H&P Note (Signed)
History and Physical Interval Note:  02/17/2020 12:44 PM  [name]  has presented today for surgery, with the diagnosis of LARYNGEAL MASS.  The various methods of treatment have been discussed with the patient and family. After consideration of risks, benefits and other options for treatment, the patient has consented to  Procedure(s): DIRECT LARYNGOSCOPY WITH BIOPSY (N/A) as a surgical intervention.  The patient's history has been reviewed, patient examined, no change in status, stable for surgery.  I have reviewed the patient's chart and labs.  Questions were answered to the patient's satisfaction.     Jason Noble

## 2020-02-17 NOTE — Anesthesia Preprocedure Evaluation (Addendum)
Anesthesia Evaluation  Patient identified by MRN, date of birth, ID band Patient awake    Reviewed: Allergy & Precautions, NPO status , Patient's Chart, lab work & pertinent test results  History of Anesthesia Complications Negative for: history of anesthetic complications  Airway Mallampati: II  TM Distance: >3 FB Neck ROM: Full    Dental  (+) Dental Advisory Given, Caps, Implants   Pulmonary former smoker,    Pulmonary exam normal        Cardiovascular hypertension, Pt. on medications + CAD, + Past MI, + Cardiac Stents, + CABG ('91) and + Peripheral Vascular Disease  Normal cardiovascular exam   '19 TTE - mild LVH. EF 55% to 60%. Grade 1 diastolic dysfunction. The aortic root was moderately dilated. LA was mildly dilated. There was an atrial septal aneurysm.     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   Reproductive/Obstetrics                            Anesthesia Physical Anesthesia Plan  ASA: III  Anesthesia Plan: General   Post-op Pain Management:    Induction: Intravenous  PONV Risk Score and Plan: 2 and Treatment may vary due to age or medical condition and Ondansetron  Airway Management Planned: Oral ETT  Additional Equipment: None  Intra-op Plan:   Post-operative Plan: Extubation in OR  Informed Consent: I have reviewed the patients History and Physical, chart, labs and discussed the procedure including the risks, benefits and alternatives for the proposed anesthesia with the  patient or authorized representative who has indicated his/her understanding and acceptance.     Dental advisory given  Plan Discussed with: CRNA and Anesthesiologist  Anesthesia Plan Comments: (Small diameter ETT for procedure )       Anesthesia Quick Evaluation

## 2020-02-17 NOTE — Anesthesia Procedure Notes (Signed)
Procedure Name: Intubation Date/Time: 02/17/2020 12:58 PM Performed by: Mariea Clonts, CRNA Pre-anesthesia Checklist: Patient identified, Emergency Drugs available, Suction available and Patient being monitored Patient Re-evaluated:Patient Re-evaluated prior to induction Oxygen Delivery Method: Circle System Utilized Preoxygenation: Pre-oxygenation with 100% oxygen Induction Type: IV induction Ventilation: Mask ventilation without difficulty Laryngoscope Size: Miller and 2 Grade View: Grade I Tube type: Oral Tube size: 6.5 mm Number of attempts: 1 Airway Equipment and Method: Stylet and Oral airway Placement Confirmation: ETT inserted through vocal cords under direct vision,  positive ETCO2 and breath sounds checked- equal and bilateral Tube secured with: Tape Dental Injury: Teeth and Oropharynx as per pre-operative assessment

## 2020-02-17 NOTE — Transfer of Care (Signed)
Immediate Anesthesia Transfer of Care Note  Patient: Jason Noble  Procedure(s) Performed: DIRECT LARYNGOSCOPY WITH BIOPSY (N/A Throat)  Patient Location: PACU  Anesthesia Type:General  Level of Consciousness: awake, alert  and oriented  Airway & Oxygen Therapy: Patient Spontanous Breathing and Patient connected to nasal cannula oxygen  Post-op Assessment: Report given to RN, Post -op Vital signs reviewed and stable and Patient moving all extremities X 4  Post vital signs: Reviewed and stable  Last Vitals:  Vitals Value Taken Time  BP 150/81 02/17/20 1325  Temp 36.1 C 02/17/20 1325  Pulse 71 02/17/20 1327  Resp 11 02/17/20 1327  SpO2 99 % 02/17/20 1327  Vitals shown include unvalidated device data.  Last Pain:  Vitals:   02/17/20 0945  TempSrc: Tympanic         Complications: No apparent anesthesia complications

## 2020-02-17 NOTE — Telephone Encounter (Signed)
Assuming requesting office received clearance. Looks like patient is currently admitted in the hospital due to surgery.

## 2020-02-18 LAB — SURGICAL PATHOLOGY

## 2020-02-22 ENCOUNTER — Other Ambulatory Visit: Payer: Self-pay | Admitting: Otolaryngology

## 2020-02-22 ENCOUNTER — Other Ambulatory Visit (HOSPITAL_COMMUNITY): Payer: Self-pay | Admitting: Otolaryngology

## 2020-02-22 DIAGNOSIS — Z85819 Personal history of malignant neoplasm of unspecified site of lip, oral cavity, and pharynx: Secondary | ICD-10-CM

## 2020-02-22 DIAGNOSIS — Z923 Personal history of irradiation: Secondary | ICD-10-CM

## 2020-02-24 DIAGNOSIS — Z85810 Personal history of malignant neoplasm of tongue: Secondary | ICD-10-CM | POA: Diagnosis not present

## 2020-02-24 DIAGNOSIS — Z9221 Personal history of antineoplastic chemotherapy: Secondary | ICD-10-CM | POA: Diagnosis not present

## 2020-02-24 DIAGNOSIS — C321 Malignant neoplasm of supraglottis: Secondary | ICD-10-CM | POA: Diagnosis not present

## 2020-02-24 DIAGNOSIS — Z923 Personal history of irradiation: Secondary | ICD-10-CM | POA: Diagnosis not present

## 2020-02-25 DIAGNOSIS — D0439 Carcinoma in situ of skin of other parts of face: Secondary | ICD-10-CM | POA: Diagnosis not present

## 2020-02-25 DIAGNOSIS — C44329 Squamous cell carcinoma of skin of other parts of face: Secondary | ICD-10-CM | POA: Diagnosis not present

## 2020-03-04 ENCOUNTER — Ambulatory Visit (HOSPITAL_COMMUNITY)
Admission: RE | Admit: 2020-03-04 | Discharge: 2020-03-04 | Disposition: A | Discharge status: Home / Self Care | Payer: Medicare Other | Source: Ambulatory Visit | Attending: Otolaryngology | Admitting: Otolaryngology

## 2020-03-04 ENCOUNTER — Other Ambulatory Visit: Payer: Self-pay

## 2020-03-04 DIAGNOSIS — Z923 Personal history of irradiation: Secondary | ICD-10-CM | POA: Insufficient documentation

## 2020-03-04 DIAGNOSIS — Z85819 Personal history of malignant neoplasm of unspecified site of lip, oral cavity, and pharynx: Secondary | ICD-10-CM

## 2020-03-04 LAB — GLUCOSE, CAPILLARY: Glucose-Capillary: 87 mg/dL (ref 70–99)

## 2020-03-04 MED ORDER — FLUDEOXYGLUCOSE F - 18 (FDG) INJECTION
9.0200 | Freq: Once | INTRAVENOUS | Status: AC
  Administered 2020-03-04: 9.02 via INTRAVENOUS

## 2020-03-04 NOTE — Progress Notes (Signed)
Oncology Nurse Navigator Documentation  Placed introductory call to new referral patient Jason Noble  Introduced myself as the H&N oncology nurse navigator that works with Dr. Isidore Moos to whom he has been referred by Dr. Constance Holster. He confirmed understanding of referral.  Briefly explained my role as his navigator, provided my contact information.   Confirmed understanding of upcoming appts and [place] location, explained arrival and registration process.  I explained the purpose of a dental evaluation prior to starting RT, indicated he wd be contacted by [facility] to arrange an appt.    I encouraged him to call with questions/concerns as he moves forward with appts and procedures.    He verbalized understanding of information provided, expressed appreciation for my call.   Navigator Initial Assessment . Employment Status: retired . Currently on FMLA / STD: n/a . Living Situation: He lives with his wife . Support System: wife . PCP: TBD . PCD:  TBD . Financial Concerns: no . Transportation Needs: no . Sensory Deficits: . Language Barriers/Interpreter Needed:  no . Ambulation Needs: no . DME Used in Home: no . Psychosocial Needs:  no . Concerns/Needs Understanding Cancer:  addressed/answered by navigator to best of ability . Self-Expressed Needs: no  Harlow Asa RN, BSN, OCN Head & Neck Oncology Nurse [place] at [facility] Phone # [phone]  Fax # [phone]

## 2020-03-07 NOTE — Progress Notes (Signed)
Head and Neck Cancer Location of Tumor / Histology:  Invasive squamous cell carcinoma RIGHT epiglottis  Patient presented 2-3 months ago with symptoms of: sore throat on the right side.   Biopsies revealed:  02/17/2020 FINAL MICROSCOPIC DIAGNOSIS:  A. EPIGLOTTIS, RIGHT LINGUAL SURFACE, BIOPSY:  - Invasive squamous cell carcinoma.  COMMENT: Definitive lymphovascular invasion is not seen.   Nutrition Status Yes No Comments  Weight changes? []  [x]    Swallowing concerns? []  [x]    PEG? []  [x]     Referrals Yes No Comments  Social Work? []  [x]    Dentistry? [x]  []    Swallowing therapy? [x]  []    Nutrition? [x]  []    Med/Onc? []  [x]     Safety Issues Yes No Comments  Prior radiation? [x]  []  Treated by Dr. Kyung Rudd on 06/18/2011 - 08/03/2011 to a dose of 69.96 Gy in 33 fractions at 2.12 Gy per fraction  Pacemaker/ICD? []  [x]    Possible current pregnancy? []  [x]    Is the patient on methotrexate? []  [x]     Tobacco/Marijuana/Snuff/ETOH use:  Former smoker (quit in 1967). Denies any smokeless tobacco or illicit drug use. Occasionally drinks a glass of wine.  Past/Anticipated interventions by otolaryngology, if any:  02/24/2020 Dr. Francina Ames Assuming the PET shows no surprises, I have recommended an attempt at transoral resection. This will involve a good portion of the epiglottis. Most patients will have a period of dysphagia and aspiration, and I expect, with his h/o chemoRT, his recovery may be somewhat prolonged. We'll place a Dobhoff at the time of surgery and anticipate a short hospital stay with outpatient swallow rehabilitation. A g-tube may be necessary. I'll see him back after the PET and go over details further. We'll have him see our SLPs in anticipation of needing to learn the supraglottic swallow technique postoperatively. 02/17/2020 Dr. Izora Gala DIRECT LARYNGOSCOPY WITH BIOPSY  Past/Anticipated interventions by medical oncology, if any:  Under care of Dr. Heath Lark:  Received weekly Cisplatin infusion (with concurrent radiation). Last cycle on 07/23/2011. Was discharged from care after 5 years with no evidence of recurrence.  Current Complaints / other details:   Had a mandible fracture on right side (about 8 months after completing radiation/chemo in 2012). Received hyperbaric oxygen therapy and then fracture was repaired transorally with a plate. PET scan 03/04/2020  IMPRESSION: 1. No evidence of local head neck cancer recurrence in the pharynx or hypopharynx. 2. No hypermetabolic cervical lymph nodes. 3. No evidence distant metastatic disease.  History of prostate cancer in 2002. Underwent prostatectomy and is under care/mangement of his urologist

## 2020-03-08 ENCOUNTER — Other Ambulatory Visit: Payer: Self-pay

## 2020-03-08 ENCOUNTER — Ambulatory Visit
Admission: RE | Admit: 2020-03-08 | Discharge: 2020-03-08 | Disposition: A | Discharge status: Home / Self Care | Payer: Medicare Other | Source: Ambulatory Visit | Attending: Radiation Oncology | Admitting: Radiation Oncology

## 2020-03-08 ENCOUNTER — Encounter: Payer: Self-pay | Admitting: Radiation Oncology

## 2020-03-08 VITALS — BP 155/89 | HR 72 | Temp 98.2°F | Resp 18 | Ht 69.0 in | Wt 183.1 lb

## 2020-03-08 DIAGNOSIS — C01 Malignant neoplasm of base of tongue: Secondary | ICD-10-CM

## 2020-03-08 DIAGNOSIS — C321 Malignant neoplasm of supraglottis: Secondary | ICD-10-CM | POA: Insufficient documentation

## 2020-03-08 NOTE — Progress Notes (Signed)
Oncology Nurse Navigator Documentation  Met with patient during initial consult with Dr. Isidore Moos.  He was accompanied by his wife, Jason Noble  . Further introduced myself as his Navigator, explained my role as a member of the Care Team.   . Provided New Patient Information packet, discussed contents: o Contact information for physician(s), myself, other members of the Care Team. o Advance Directive information ([place] blue pamphlet with LCSW contact info); provided [facility] AD booklet at his request, encouraged him to contact Gwinda [place] LCSW to complete. . Provided introductory explanation of radiation treatment including SIM planning and purpose of Aquaplast head and shoulder mask, showed them example.   . I encouraged them to contact me with questions/concerns as treatments/procedures begin.  They verbalized understanding of information provided.    After an extended conversation with Dr. Isidore Moos and her recommendation, Jason Noble will seek care at [facility] with Dr. Nicolette Bang for surgery. He will see SLP and Dr. Nicolette Bang today, 03/08/20.  He knows that he can contact me with any further questions or concerns and was provided with my contact information.   Harlow Asa, RN, BSN Head & Neck Oncology Nurse [place] at [place] [phone]

## 2020-03-08 NOTE — Progress Notes (Signed)
Radiation Oncology         [phone] ________________________________  Initial outpatient Consultation  Name: Jason Noble MRN: [id number]  Date: 03/08/2020  DOB: 1941-04-05  WE:[id number], Gwyndolyn Saxon, MD  Izora Gala, MD   REFERRING PHYSICIAN: Izora Gala, MD  DIAGNOSIS: C32.1    ICD-10-CM   1. Malignant neoplasm of base of tongue ([place])  C01   2. 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   He received concurrent chemoradiotherapy in 2012, details below.  He has been without evidence of disease since then, until a few months ago he developed a sore throat.  He ultimately saw otolaryngology and a small lesion was appreciated on the right epiglottis, lingual surface.  This was biopsied and it revealed invasive squamous cell carcinoma.  He was referred to [facility] for consideration of salvage surgery and was seen by Dr. Nicolette Bang.  I have personally spoken with Dr. Nicolette Bang.  He is a good candidate for surgery, with the understanding that there is a risk of dysphagia.  Recommendations were made for close follow-up with speech-language pathology to retrain him on how to swallow without aspiration.    I personally reviewed his PET scan which was performed on 03/04/2020.  This does not show any evidence of nodal or distant metastatic disease.  The lesion on his epiglottis is not well  visualized.  He reports chronic issues with difficulty chewing and xerostomia.  This makes it difficult to swallow, though he is able to eat solid foods as long as he takes his time.  He has a history of multiple skin cancers.   Nutrition Status Yes No Comments  Weight changes? []  [x]    Swallowing concerns? []  [x]    PEG? []  [x]     Safety Issues Yes No Comments  Prior radiation? [x]  []  Treated by Dr. Kyung Rudd on 06/18/2011 - 08/03/2011 to a dose of 69.96 Gy in 33 fractions at 2.12 Gy per fraction  Pacemaker/ICD? []  [x]    Possible current pregnancy? []  [x]    Is the patient on methotrexate? []  [x]     Tobacco/Marijuana/Snuff/ETOH use:  Former smoker (quit in 1967). Denies any smokeless tobacco or illicit drug use. Occasionally drinks a glass of wine.  Past/Anticipated interventions by otolaryngology, if any:  02/24/2020 Dr. Francina Ames Assuming the PET shows no surprises, I have recommended an attempt at transoral resection. This will involve a good portion of the epiglottis. Most patients will have a period of dysphagia and aspiration, and I expect, with his h/o chemoRT, his recovery may be somewhat prolonged. We'll place a Dobhoff at the time of surgery and anticipate a short hospital stay with outpatient swallow rehabilitation. A g-tube may be necessary. I'll see him back after the PET and go over details further. We'll have him see our SLPs in anticipation of needing to learn the supraglottic swallow technique postoperatively. 02/17/2020 Dr. Izora Gala DIRECT  LARYNGOSCOPY WITH BIOPSY  Past/Anticipated interventions by medical oncology, if any:  Under care of Dr. Heath Lark:  Received weekly Cisplatin infusion (with concurrent radiation). Last cycle on 07/23/2011. Was discharged from care after 5 years with no evidence of recurrence.  Current Complaints / other details:   Had a mandible fracture on right side (about 8 months after completing radiation/chemo in 2012). Received  hyperbaric oxygen therapy and then fracture was repaired transorally with a plate.    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    PAST SURGICAL HISTORY: Past Surgical History:  Procedure Laterality Date  . CATARACT EXTRACTION W/ INTRAOCULAR LENS  IMPLANT, BILATERAL    . CHOLECYSTECTOMY  04/2009   3 surgeries involved: lap chole x2 laparotomy  . CORONARY ARTERY BYPASS GRAFT  1991   triple  . DIRECT LARYNGOSCOPY N/A 02/17/2020   Procedure: DIRECT LARYNGOSCOPY WITH BIOPSY;  Surgeon: Izora Gala, MD;  Location: [place];  Service: ENT;  Laterality: N/A;  . ELBOW SURGERY  42 years ago   removal left radial head due to hockey accident  . EXPLORATORY LAPAROTOMY  04/2010   Dr. Redmond Pulling (3rd surgery for gall bladder)  . INGUINAL HERNIA REPAIR Right 09/23/2014   Procedure: RIGHT INGUINAL HERNIA REPAIR WITH MESH;  Surgeon: Jackolyn Confer, MD;  Location: [facility];  Service: General;  Laterality: Right;  . INGUINAL HERNIA REPAIR Left 09/30/2015   Procedure: HERNIA REPAIR LEFT INGUINAL ADULT;  Surgeon: Jackolyn Confer, MD;  Location: [facility];  Service: General;  Laterality: Left;  . INSERTION OF MESH Left 09/30/2015   Procedure: INSERTION OF MESH;  Surgeon: Jackolyn Confer, MD;  Location: [facility];  Service: General;  Laterality: Left;  Marland Kitchen MULTIPLE TOOTH EXTRACTIONS     prior to radiation    . PEG TUBE PLACEMENT  06/13/11 [facility]   10/2011 removed  . PROSTATECTOMY  2002  . TONSILLECTOMY     x2: as child then 1985 for Left lingual tonsil removed  . TOTAL HIP ARTHROPLASTY Left 02/08/2016   Procedure: TOTAL HIP ARTHROPLASTY ANTERIOR APPROACH;  Surgeon: Frederik Pear, MD;  Location: [place];  Service: Orthopedics;  Laterality: Left;  . ULNAR NERVE TRANSPOSITION Left 10/16/2018   Procedure: ULNAR NERVE DECOMPRESSION/TRANSPOSITION;  Surgeon: [name], MD;  Location: [place];  Service: Orthopedics;  Laterality: Left;    FAMILY HISTORY: family history includes Coronary artery disease in his brother; Stroke in his mother.  SOCIAL HISTORY:  reports that he quit smoking about 54 years ago. His smoking use included cigarettes. He has a 1.25 pack-year smoking history. He has never used smokeless tobacco. He reports current alcohol use of about 1.0 standard drink of alcohol per week. He reports that he does not use drugs.  ALLERGIES: Patient has no known allergies.  MEDICATIONS:  Current Outpatient Medications  Medication Sig Dispense Refill  . amLODipine (NORVASC) 10 MG tablet TAKE ONE TABLET BY MOUTH ONE TIME DAILY  (Patient taking differently: Take 10 mg by mouth daily. ) 90 tablet 0  .  aspirin EC 81 MG tablet Take 1 tablet (81 mg total) by mouth daily.    Marland Kitchen atorvastatin (LIPITOR) 80 MG tablet Take 1 tablet (80 mg total) by mouth daily. 90 tablet 3  . Coenzyme Q10 (CO Q 10 PO) Take 200 mg by mouth daily.    Marland Kitchen losartan (COZAAR) 100 MG tablet Take 1 tablet (100 mg total) by mouth daily. 90 tablet 3  . OVER THE COUNTER MEDICATION Take 20 mg by mouth daily. Lutigold    . OVER THE COUNTER MEDICATION Take 2 capsules by mouth daily. Nautrol  Daily vegetable x2 Daily fruit x2    . tretinoin (RETIN-A) 0.025 % cream Apply 1 application topically daily as needed (apply to face rash).      No current facility-administered medications for this encounter.   Facility-Administered Medications  Ordered in Other Encounters  Medication Dose Route Frequency Provider Last Rate Last Admin  . topical emolient (BIAFINE) emulsion   Topical BID Kyung Rudd, MD   Given at 07/30/11 1405    REVIEW OF SYSTEMS:  Notable for that above.   PHYSICAL EXAM:  height is 5\' 9"  (1.753 m) and weight is 183 lb 2 oz (83.1 kg). His temporal temperature is 98.2 F (36.8 C). His blood pressure is 155/89 (abnormal) and his pulse is 72. His respiration is 18 and oxygen saturation is 97%.   General: Alert and oriented, in no acute distress Psychiatric: Judgment and insight are intact. Affect is appropriate. He is ambulatory. Further physical exam was deferred today.  ECOG = 1  0 - Asymptomatic (Fully active, able to carry on all predisease activities without restriction)  1 - Symptomatic but completely ambulatory (Restricted in physically strenuous activity but ambulatory and able to carry out work of a light or sedentary nature. For example, light housework, office work)  2 - Symptomatic, <50% in bed during the day (Ambulatory and capable of all self care but unable to carry out any work activities. Up and about more than 50% of waking hours)  3 - Symptomatic, >50% in bed, but not bedbound (Capable of only limited self-care, confined to bed or chair 50% or more of waking hours)  4 - Bedbound (Completely disabled. Cannot carry on any self-care. Totally confined to bed or chair)  5 - Death   Eustace Pen MM, Creech RH, Tormey DC, et al. [phone]). "Toxicity and response criteria of the [facility]". [place] Oncol. 5 (6): 649-55   LABORATORY DATA:  Lab Results  Component Value Date   WBC 5.6 02/11/2020   HGB 13.4 02/11/2020   HCT 41.9 02/11/2020   MCV 92.5 02/11/2020   PLT 160 02/11/2020   CMP     Component Value Date/Time   NA 140 02/11/2020 0947   NA CANCELED 10/30/2019 0914   NA 142 04/27/2014 1500   K 4.1 02/11/2020 0947   K 4.6 04/27/2014 1500   CL 107 02/11/2020 0947    CL 106 10/22/2012 0829   CO2 25 02/11/2020 0947   CO2 27 04/27/2014 1500   GLUCOSE 169 (H) 02/11/2020 0947   GLUCOSE 82 04/27/2014 1500   GLUCOSE 101 (H) 10/22/2012 0829   BUN 30 (H) 02/11/2020 0947   BUN 24 10/30/2019 0914   BUN 20.4 04/27/2014 1500   CREATININE 1.15 02/11/2020 0947   CREATININE 1.29 (H) 12/28/2016 0923   CREATININE 1.3 04/27/2014 1500   CALCIUM 9.1 02/11/2020 0947   CALCIUM 9.5 04/27/2014 1500   PROT 6.3 10/30/2019 0914  PROT 6.2 (L) 04/27/2014 1500   ALBUMIN 4.1 10/30/2019 0914   ALBUMIN 3.6 04/27/2014 1500   AST 25 10/30/2019 0914   AST 15 04/27/2014 1500   ALT 18 10/30/2019 0914   ALT 11 04/27/2014 1500   ALKPHOS 94 10/30/2019 0914   ALKPHOS 61 04/27/2014 1500   BILITOT 0.6 10/30/2019 0914   BILITOT 0.68 04/27/2014 1500   GFRNONAA >60 02/11/2020 0947   GFRAA >60 02/11/2020 0947     RADIOGRAPHY: NM PET Image Initial (PI) Skull Base To Thigh  Result Date: 03/04/2020 CLINICAL DATA:  Subsequent treatment strategy for head neck carcinoma. Pharyngeal carcinoma. Original biopsy June 2021. EXAM: NUCLEAR MEDICINE PET SKULL BASE TO THIGH TECHNIQUE: 9.0 mCi F-18 FDG was injected intravenously. Full-ring PET imaging was performed from the skull base to thigh after the radiotracer. CT data was obtained and used for attenuation correction and anatomic localization. Fasting blood glucose: 87 mg/dl COMPARISON:  Neck CT 01/08/2020 FINDINGS: Mediastinal blood pool activity: SUV max 1.37 Liver activity: SUV max 2.1 NECK: Photopenia within the LEFT frontal lobe corresponds to encephalomalacia comparison CT. There is a chronic finding present on MRI from 2009. There is no hypermetabolic activity within the pharyngeal mucosa of the pharynx or hypopharynx. No hypermetabolic cervical lymph nodes. Incidental CT findings: Postsurgical change consistent with neck dissection CHEST: No hypermetabolic mediastinal or hilar nodes. No suspicious pulmonary nodules on the CT scan. Incidental CT  findings: Coronary artery calcification and aortic atherosclerotic calcification. Post CABG ABDOMEN/PELVIS: No abnormal hypermetabolic activity within the liver, pancreas, adrenal glands, or spleen. No hypermetabolic lymph nodes in the abdomen or pelvis. Incidental CT findings: Extensive vascular calcifications. SKELETON: Is Incidental CT findings: none IMPRESSION: 1. No evidence of local head neck cancer recurrence in the pharynx or hypopharynx. 2. No hypermetabolic cervical lymph nodes. 3. No evidence distant metastatic disease. Electronically Signed   By: Suzy Bouchard M.D.   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  I reviewed his prior radiotherapy plan back from 2012 when he was treated by Dr. Lisbeth Renshaw.  The epiglottis was within the 95% isodose line (in other words, he received a high dose of radiotherapy where he now presents with a second primary).  I explained that while radiotherapy is an option, it would carry significant risks.  This would include risks of tissue damage that may never heal, significant atrophy of the epiglottis, bleeding, dysphagia.  If he receives received reirradiation he would need to be considered for radiosensitization with some type of chemotherapy concurrently.  Bassed on my conversation with Dr. Nicolette Bang, the patient is a good candidate for surgery, and I think this is the treatment of choice for salvaging a second primary in a previously radiated field.  We spoke in detail about the importance of following the instructions of speech-language pathology very closely.  He appears motivated to do this.  He will proceed with seeing Dr. Nicolette Bang in the near future and appreciated my opinion today.   On date of service, in total, I spent 60 minutes on this encounter.  This included face-to-face time with the patient, reviewing his records, documentation, and a phone call with Dr.  Nicolette Bang. __________________________________________   Eppie Gibson, MD

## 2020-03-12 ENCOUNTER — Other Ambulatory Visit: Payer: Self-pay | Admitting: Adult Health

## 2020-03-12 ENCOUNTER — Other Ambulatory Visit: Payer: Self-pay | Admitting: Cardiovascular Disease

## 2020-03-12 ENCOUNTER — Other Ambulatory Visit: Payer: Self-pay | Admitting: Cardiology

## 2020-03-15 ENCOUNTER — Encounter: Payer: Self-pay | Admitting: *Deleted

## 2020-03-15 DIAGNOSIS — I714 Abdominal aortic aneurysm, without rupture: Secondary | ICD-10-CM | POA: Diagnosis not present

## 2020-03-15 DIAGNOSIS — R1310 Dysphagia, unspecified: Secondary | ICD-10-CM | POA: Diagnosis not present

## 2020-03-15 DIAGNOSIS — Z923 Personal history of irradiation: Secondary | ICD-10-CM | POA: Diagnosis not present

## 2020-03-15 DIAGNOSIS — Z9861 Coronary angioplasty status: Secondary | ICD-10-CM | POA: Diagnosis not present

## 2020-03-15 DIAGNOSIS — Z951 Presence of aortocoronary bypass graft: Secondary | ICD-10-CM | POA: Diagnosis not present

## 2020-03-15 DIAGNOSIS — I2510 Atherosclerotic heart disease of native coronary artery without angina pectoris: Secondary | ICD-10-CM | POA: Diagnosis present

## 2020-03-15 DIAGNOSIS — C321 Malignant neoplasm of supraglottis: Secondary | ICD-10-CM | POA: Diagnosis not present

## 2020-03-15 DIAGNOSIS — I252 Old myocardial infarction: Secondary | ICD-10-CM | POA: Diagnosis not present

## 2020-03-15 DIAGNOSIS — Z8673 Personal history of transient ischemic attack (TIA), and cerebral infarction without residual deficits: Secondary | ICD-10-CM | POA: Insufficient documentation

## 2020-03-15 DIAGNOSIS — Z8546 Personal history of malignant neoplasm of prostate: Secondary | ICD-10-CM | POA: Diagnosis not present

## 2020-03-15 DIAGNOSIS — Z9221 Personal history of antineoplastic chemotherapy: Secondary | ICD-10-CM | POA: Diagnosis not present

## 2020-03-15 DIAGNOSIS — Z87891 Personal history of nicotine dependence: Secondary | ICD-10-CM | POA: Diagnosis not present

## 2020-03-15 DIAGNOSIS — I10 Essential (primary) hypertension: Secondary | ICD-10-CM | POA: Diagnosis not present

## 2020-03-15 DIAGNOSIS — I739 Peripheral vascular disease, unspecified: Secondary | ICD-10-CM | POA: Diagnosis not present

## 2020-03-15 DIAGNOSIS — E785 Hyperlipidemia, unspecified: Secondary | ICD-10-CM | POA: Diagnosis not present

## 2020-03-17 DIAGNOSIS — I714 Abdominal aortic aneurysm, without rupture: Secondary | ICD-10-CM | POA: Diagnosis not present

## 2020-03-17 DIAGNOSIS — C321 Malignant neoplasm of supraglottis: Secondary | ICD-10-CM | POA: Diagnosis not present

## 2020-03-17 DIAGNOSIS — Z9221 Personal history of antineoplastic chemotherapy: Secondary | ICD-10-CM | POA: Diagnosis not present

## 2020-03-17 DIAGNOSIS — E785 Hyperlipidemia, unspecified: Secondary | ICD-10-CM | POA: Diagnosis not present

## 2020-03-17 DIAGNOSIS — Z8546 Personal history of malignant neoplasm of prostate: Secondary | ICD-10-CM | POA: Diagnosis not present

## 2020-03-17 DIAGNOSIS — I739 Peripheral vascular disease, unspecified: Secondary | ICD-10-CM | POA: Diagnosis not present

## 2020-03-17 DIAGNOSIS — Z923 Personal history of irradiation: Secondary | ICD-10-CM | POA: Diagnosis not present

## 2020-03-17 DIAGNOSIS — Z87891 Personal history of nicotine dependence: Secondary | ICD-10-CM | POA: Diagnosis not present

## 2020-03-17 DIAGNOSIS — I252 Old myocardial infarction: Secondary | ICD-10-CM | POA: Diagnosis not present

## 2020-03-17 DIAGNOSIS — Z951 Presence of aortocoronary bypass graft: Secondary | ICD-10-CM | POA: Diagnosis not present

## 2020-03-17 DIAGNOSIS — I2510 Atherosclerotic heart disease of native coronary artery without angina pectoris: Secondary | ICD-10-CM | POA: Diagnosis not present

## 2020-03-17 DIAGNOSIS — Z9861 Coronary angioplasty status: Secondary | ICD-10-CM | POA: Diagnosis not present

## 2020-03-17 DIAGNOSIS — I10 Essential (primary) hypertension: Secondary | ICD-10-CM | POA: Diagnosis not present

## 2020-03-17 DIAGNOSIS — R1310 Dysphagia, unspecified: Secondary | ICD-10-CM | POA: Diagnosis not present

## 2020-03-17 DIAGNOSIS — I517 Cardiomegaly: Secondary | ICD-10-CM | POA: Diagnosis not present

## 2020-03-18 DIAGNOSIS — I714 Abdominal aortic aneurysm, without rupture: Secondary | ICD-10-CM | POA: Diagnosis not present

## 2020-03-18 DIAGNOSIS — C321 Malignant neoplasm of supraglottis: Secondary | ICD-10-CM | POA: Diagnosis not present

## 2020-03-18 DIAGNOSIS — I2510 Atherosclerotic heart disease of native coronary artery without angina pectoris: Secondary | ICD-10-CM | POA: Diagnosis not present

## 2020-03-18 DIAGNOSIS — I739 Peripheral vascular disease, unspecified: Secondary | ICD-10-CM | POA: Diagnosis not present

## 2020-03-18 DIAGNOSIS — I252 Old myocardial infarction: Secondary | ICD-10-CM | POA: Diagnosis not present

## 2020-03-18 DIAGNOSIS — E785 Hyperlipidemia, unspecified: Secondary | ICD-10-CM | POA: Diagnosis not present

## 2020-04-05 DIAGNOSIS — Z85810 Personal history of malignant neoplasm of tongue: Secondary | ICD-10-CM | POA: Diagnosis not present

## 2020-04-05 DIAGNOSIS — R1312 Dysphagia, oropharyngeal phase: Secondary | ICD-10-CM | POA: Diagnosis not present

## 2020-04-05 DIAGNOSIS — C321 Malignant neoplasm of supraglottis: Secondary | ICD-10-CM | POA: Diagnosis not present

## 2020-04-11 ENCOUNTER — Other Ambulatory Visit: Payer: Self-pay | Admitting: Cardiology

## 2020-04-21 ENCOUNTER — Telehealth: Payer: Self-pay | Admitting: *Deleted

## 2020-04-21 NOTE — Telephone Encounter (Signed)
A detailed message was left,re: his follow up visit. °

## 2020-05-03 DIAGNOSIS — R1312 Dysphagia, oropharyngeal phase: Secondary | ICD-10-CM | POA: Diagnosis not present

## 2020-05-03 DIAGNOSIS — C01 Malignant neoplasm of base of tongue: Secondary | ICD-10-CM | POA: Diagnosis not present

## 2020-05-03 DIAGNOSIS — C321 Malignant neoplasm of supraglottis: Secondary | ICD-10-CM | POA: Diagnosis not present

## 2020-05-03 DIAGNOSIS — Z9221 Personal history of antineoplastic chemotherapy: Secondary | ICD-10-CM | POA: Diagnosis not present

## 2020-05-11 DIAGNOSIS — D045 Carcinoma in situ of skin of trunk: Secondary | ICD-10-CM | POA: Diagnosis not present

## 2020-05-11 DIAGNOSIS — D0439 Carcinoma in situ of skin of other parts of face: Secondary | ICD-10-CM | POA: Diagnosis not present

## 2020-05-11 DIAGNOSIS — L570 Actinic keratosis: Secondary | ICD-10-CM | POA: Diagnosis not present

## 2020-05-11 DIAGNOSIS — L821 Other seborrheic keratosis: Secondary | ICD-10-CM | POA: Diagnosis not present

## 2020-05-11 DIAGNOSIS — Z85828 Personal history of other malignant neoplasm of skin: Secondary | ICD-10-CM | POA: Diagnosis not present

## 2020-05-11 DIAGNOSIS — D485 Neoplasm of uncertain behavior of skin: Secondary | ICD-10-CM | POA: Diagnosis not present

## 2020-05-11 DIAGNOSIS — C44719 Basal cell carcinoma of skin of left lower limb, including hip: Secondary | ICD-10-CM | POA: Diagnosis not present

## 2020-05-11 DIAGNOSIS — D2262 Melanocytic nevi of left upper limb, including shoulder: Secondary | ICD-10-CM | POA: Diagnosis not present

## 2020-05-11 DIAGNOSIS — C44519 Basal cell carcinoma of skin of other part of trunk: Secondary | ICD-10-CM | POA: Diagnosis not present

## 2020-05-11 DIAGNOSIS — Z85820 Personal history of malignant melanoma of skin: Secondary | ICD-10-CM | POA: Diagnosis not present

## 2020-05-11 DIAGNOSIS — L578 Other skin changes due to chronic exposure to nonionizing radiation: Secondary | ICD-10-CM | POA: Diagnosis not present

## 2020-05-17 ENCOUNTER — Encounter: Payer: Self-pay | Admitting: Cardiology

## 2020-05-17 ENCOUNTER — Telehealth: Payer: Self-pay | Admitting: Cardiology

## 2020-05-17 ENCOUNTER — Telehealth (INDEPENDENT_AMBULATORY_CARE_PROVIDER_SITE_OTHER): Payer: Medicare Other | Admitting: Cardiology

## 2020-05-17 ENCOUNTER — Telehealth: Payer: Self-pay

## 2020-05-17 VITALS — BP 136/86

## 2020-05-17 DIAGNOSIS — I2510 Atherosclerotic heart disease of native coronary artery without angina pectoris: Secondary | ICD-10-CM

## 2020-05-17 DIAGNOSIS — Z7901 Long term (current) use of anticoagulants: Secondary | ICD-10-CM | POA: Diagnosis not present

## 2020-05-17 DIAGNOSIS — C321 Malignant neoplasm of supraglottis: Secondary | ICD-10-CM | POA: Diagnosis not present

## 2020-05-17 DIAGNOSIS — I2699 Other pulmonary embolism without acute cor pulmonale: Secondary | ICD-10-CM | POA: Insufficient documentation

## 2020-05-17 DIAGNOSIS — I10 Essential (primary) hypertension: Secondary | ICD-10-CM

## 2020-05-17 DIAGNOSIS — Z951 Presence of aortocoronary bypass graft: Secondary | ICD-10-CM | POA: Diagnosis not present

## 2020-05-17 DIAGNOSIS — I77810 Thoracic aortic ectasia: Secondary | ICD-10-CM | POA: Diagnosis not present

## 2020-05-17 NOTE — Telephone Encounter (Signed)
I called Mr. Damita Lack to tell him he should increase his Eliquis to 5 mg twice daily.  I left a message on his voicemail.  He may have called blocking in place as we had trouble getting through to him earlier this morning as well. I'll try him again tomorrow.  Kerin Ransom PA-C 05/17/2020 4:47 PM

## 2020-05-17 NOTE — Telephone Encounter (Signed)
Attempted to contact pt x 3 for virtual appointment scheduled today 8/31 with Kerin Ransom. Left message to call office to reschedule.

## 2020-05-17 NOTE — Progress Notes (Signed)
Virtual Visit via Telephone Note   This visit type was conducted due to national recommendations for restrictions regarding the COVID-19 Pandemic (e.g. social distancing) in an effort to limit this patient's exposure and mitigate transmission in our community.  Due to his co-morbid illnesses, this patient is at least at moderate risk for complications without adequate follow up.  This format is felt to be most appropriate for this patient at this time.  The patient did not have access to video technology/had technical difficulties with video requiring transitioning to audio format only (telephone).  All issues noted in this document were discussed and addressed.  No physical exam could be performed with this format.  Please refer to the patient's chart for his  consent to telehealth for [facility].    Date:  05/17/2020   ID:  Jason Noble, DOB 1941-08-19, MRN [id number] The patient was identified using 2 identifiers.  Patient Location: Home Provider Location: Home Office  PCP:  Shirline Frees, MD  Cardiologist:  Kirk Ruths, MD  Electrophysiologist:  None   Evaluation Performed:  Follow-Up Visit  Chief Complaint:  none  History of Present Illness:    Jason Noble is a 79 y.o. male with a history of CAD, he had CABG in 1991 and a PCI with BMS in 2008.  Echocardiogram in July 2019 showed normal LV function.  He has a known dilated aortic root.  CTA in July 2020 noted this to be 4.2 cm.  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  He underwent surgery March 17, 2020 at [facility] with removal of his epiglottis.  From a cardiac standpoint he tolerated this well.  He is currently in swallowing therapy.    At [facility] he did have an echocardiogram that showed normal LV function with mild RV dilatation.  His ascending aorta measured 4.5 cm, aortic root 4.9 cm.  For reasons  not clear to the patient he had a CTA for pulmonary embolism 03/18/2020.  This showed right middle lobe segmental defects.  Interestingly it also reports the aorta and great vessels show no aneurysm.  He does have a right lower lobe nodule, this was evaluated in June 2021 and he had a negative PET scan then.  The patient was placed on Eliquis for pulmonary embolism.  His aspirin was stopped.  He tells me after he was discharged he noted a drop in his blood pressure.  On his own he cut the Eliquis back to 2.5 mg twice a day.  His blood pressure now is stable on his usual medications, today it was 136/86.   The patient does not have symptoms concerning for COVID-19 infection (fever, chills, cough, or new shortness of breath).    Past Medical History:  Diagnosis Date  . Abscess of gallbladder   . 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 Basal cell carcinoma of skin   . Broken jaw ([place])    right lower   . Cholecystitis   . Coronary heart disease    has a stent  . CVA (cerebral infarction) 2007   "mild"  . Fatigue 07/26/2011  . History of cancer chemotherapy   . History of radiation therapy 06/18/2011- 08/03/2011   squamous cell tongue/69.96 Gy 22fx  . Hypercholesteremia    under control  . Hypertension   . Myocardial infarction ([place])  approx 2008 or 2009  . Numbness    right lower   . Numbness and tingling in left hand    numbess 2 fingers   . 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 Peripheral vascular disease ([place])   . PFO (patent foramen ovale)   . Prostate cancer ([place]) 2002  . Skin cancer    basal cell, squamous cell  . Squamous cell carcinoma of skin   . Stroke [facility])    appro in 08 or 09   Past Surgical History:  Procedure Laterality Date  . CATARACT EXTRACTION W/ INTRAOCULAR LENS  IMPLANT, BILATERAL    . CHOLECYSTECTOMY  04/2009   3 surgeries involved: lap chole x2 laparotomy  . CORONARY ARTERY BYPASS GRAFT  1991   triple  . DIRECT LARYNGOSCOPY N/A  02/17/2020   Procedure: DIRECT LARYNGOSCOPY WITH BIOPSY;  Surgeon: Izora Gala, MD;  Location: [place];  Service: ENT;  Laterality: N/A;  . ELBOW SURGERY  42 years ago   removal left radial head due to hockey accident  . EXPLORATORY LAPAROTOMY  04/2010   Dr. Redmond Pulling (3rd surgery for gall bladder)  . INGUINAL HERNIA REPAIR Right 09/23/2014   Procedure: RIGHT INGUINAL HERNIA REPAIR WITH MESH;  Surgeon: Jackolyn Confer, MD;  [facility];  Service: General;  Laterality: Right;  . INGUINAL HERNIA REPAIR Left 09/30/2015   Procedure: HERNIA REPAIR LEFT INGUINAL ADULT;  Surgeon: Jackolyn Confer, MD;  [facility];  Service: General;  Laterality: Left;  . INSERTION OF MESH Left 09/30/2015   Procedure: INSERTION OF MESH;  Surgeon: Jackolyn Confer, MD;  [facility];  Service: General;  Laterality: Left;  Marland Kitchen MULTIPLE TOOTH EXTRACTIONS     prior to radiation  . PEG TUBE PLACEMENT  06/13/11 at [facility] IR   10/2011 removed  . PROSTATECTOMY  2002  . TONSILLECTOMY     x2: as child then 1985 for Left lingual tonsil removed  . TOTAL HIP ARTHROPLASTY Left 02/08/2016   Procedure: TOTAL HIP ARTHROPLASTY ANTERIOR APPROACH;  Surgeon: Frederik Pear, MD;  Location: [place];  Service: Orthopedics;  Laterality: Left;  . ULNAR NERVE TRANSPOSITION Left 10/16/2018   Procedure: ULNAR NERVE DECOMPRESSION/TRANSPOSITION;  Surgeon: [name], MD;  Location: [place];  Service: Orthopedics;  Laterality: Left;     Current Meds  Medication Sig  . amLODipine (NORVASC) 10 MG tablet TAKE ONE TABLET BY MOUTH ONE TIME DAILY  . apixaban (ELIQUIS) 2.5 MG TABS tablet Take 2.5 mg by mouth 2 (two) times daily.  Marland Kitchen losartan (COZAAR) 100 MG tablet Take 1 tablet (100 mg total) by mouth daily.     Allergies:   Patient has no known allergies.   Social History   Tobacco Use  . Smoking status: Former Smoker    Packs/day: 0.25    Years: 5.00    Pack years: 1.25    Types: Cigarettes    Quit date: 03/20/1966    Years since quitting: 54.1   . Smokeless tobacco: Never Used  Vaping Use  . Vaping Use: Never used  Substance Use Topics  . Alcohol use: Yes    Alcohol/week: 1.0 standard drink    Types: 1 Glasses of wine per week  . Drug use: No     Family Hx: The patient's family history includes Coronary artery disease in his brother; Stroke in his mother.  ROS:   Please see the history of present illness.    All other systems reviewed and are negative.  Prior CV studies:   The following studies were reviewed today:  Echo at [facility] 03/18/2020-  SUMMARY  Left ventricular systolic function is normal.  LV ejection fraction = 60-65%.  The left ventricular size is normal.  There is normalleft ventricular wall thickness.  No segmental wall motion abnormalities seen in the left ventricle  Left ventricular filling pattern is prolonged relaxation.  The right ventricle is mildly dilated.  The right ventricular systolic function is normal.  The left atrial size is normal.  The right atrium is mildly dilated.  There is mild mitral regurgitation.  The aortic sinus is mildly dilated.  Proximal ascending aorta measures 4.5 cm.  The inferior vena cava was not visualized during the exam.  There isno pericardial effusion.  There is no comparison study available.   CTA-03/17/2020- FINDINGS:   Thoracic inlet/central airways: Thyroid normal. Airway patent.   Mediastinum/hila/axilla: No adenopathy. Patulous esophagus with air-fluid levels, which places the patient at risk for aspiration. Hiatal hernia. Dobbhoff feeding tube coils in the proximal esophagus traveling cranially before entering the central airway with distal tip terminating in a right lower lobe distal bronchus.   Heart/vessels: Normalheart size. No pericardial effusion. Aorta normal in caliber. Scattered aortic calcifications. Heavy coronary artery calcifications. Postsurgical changes of CABG. There is a filling defect involving the lobar and segmental arteries of  the middle lobe of the right lung (for example series 3 image 50-53).   Lungs/pleura: Biapical scarring. No pleural effusion or pneumothorax. There is a 9 mm nodule in the right lower lobe without definitive hypermetabolic uptake on PET/CT 03/04/2020. Additional micronodules in the right upper lobe.   Upper abdomen: Unremarkable.   Chest wall/MSK: Median sternotomy.   Labs/Other Tests and Data Reviewed:    EKG:  An ECG report from [place] dated 03/15/2020 was personally reviewed today and demonstrated:  NSR, LVH, rate 68  Recent Labs: 10/30/2019: ALT 18 02/11/2020: BUN 30; Creatinine, Ser 1.15; Hemoglobin 13.4; Platelets 160; Potassium 4.1; Sodium 140   Recent Lipid Panel Lab Results  Component Value Date/Time   CHOL 168 10/30/2019 09:14 AM   TRIG 70 10/30/2019 09:14 AM   HDL 73 10/30/2019 09:14 AM   CHOLHDL 2.3 10/30/2019 09:14 AM   CHOLHDL 2.1 12/28/2016 09:23 AM   LDLCALC 82 10/30/2019 09:14 AM   LDLDIRECT 168.1 12/15/2012 03:39 PM    Wt Readings from Last 3 Encounters:  03/08/20 183 lb 2 oz (83.1 kg)  02/17/20 180 lb (81.6 kg)  02/11/20 182 lb 1.6 oz (82.6 kg)     Objective:    Vital Signs:  BP 136/86    VITAL SIGNS:  reviewed  ASSESSMENT & PLAN:    CAD- H/O CABG in '91, PCI with BMS in '08, no recent angina.  Laryngeal CA- s/p removal of epiglottis 03/17/2020 at [facility]. No further therapy planned per patient.   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 Eliquis added but the patient decreased the dose post op thinking it was causing low B/P.  He is currently taking Eliquis 2.5 mg BID.  AO root enlargement- I'll ask Dr Stanford Breed to review recent echo and CTA reports from [facility].   Plan: I'll review with pharmacy but I think he should be on higher dose of Eliquis. I'll ask Dr Stanford Breed to review CT and echo to determine f/u for AO root enlargement.    COVID-19 Education: The signs and symptoms of COVID-19 were discussed with the patient and how to seek care for testing (follow up with  PCP or  arrange E-visit).  The importance of social distancing was discussed today.  Time:   Today, I have spent 25 minutes with the patient with telehealth technology discussing the above problems.     Medication Adjustments/Labs and Tests Ordered: Current medicines are reviewed at length with the patient today.  Concerns regarding medicines are outlined above.   Tests Ordered: No orders of the defined types were placed in this encounter.   Medication Changes: No orders of the defined types were placed in this encounter.   Follow Up:  In Person Dr Stanford Breed in 4-5 months  Signed, Kerin Ransom, Hershal Coria  05/17/2020 12:11 PM    [place]

## 2020-05-18 ENCOUNTER — Telehealth: Payer: Self-pay | Admitting: Cardiology

## 2020-05-18 NOTE — Telephone Encounter (Signed)
I called the patient again today to make sure he got my message about increasing his Eliquis to 5 mg twice daily and to let him know that Dr. Stanford Breed would like a CTA of his thoracic aorta in July 2022.  I once again left a message on his machine and told him to call us if he had any further questions.  Kerin Ransom PA-C 05/18/2020 2:10 PM

## 2020-06-02 DIAGNOSIS — M1611 Unilateral primary osteoarthritis, right hip: Secondary | ICD-10-CM | POA: Diagnosis not present

## 2020-06-02 DIAGNOSIS — M25551 Pain in right hip: Secondary | ICD-10-CM | POA: Diagnosis not present

## 2020-06-07 DIAGNOSIS — M1611 Unilateral primary osteoarthritis, right hip: Secondary | ICD-10-CM | POA: Diagnosis not present

## 2020-06-07 DIAGNOSIS — M25651 Stiffness of right hip, not elsewhere classified: Secondary | ICD-10-CM | POA: Diagnosis not present

## 2020-06-07 DIAGNOSIS — R531 Weakness: Secondary | ICD-10-CM | POA: Diagnosis not present

## 2020-06-08 DIAGNOSIS — D0439 Carcinoma in situ of skin of other parts of face: Secondary | ICD-10-CM | POA: Diagnosis not present

## 2020-06-10 DIAGNOSIS — M1611 Unilateral primary osteoarthritis, right hip: Secondary | ICD-10-CM | POA: Diagnosis not present

## 2020-06-10 DIAGNOSIS — M25651 Stiffness of right hip, not elsewhere classified: Secondary | ICD-10-CM | POA: Diagnosis not present

## 2020-06-10 DIAGNOSIS — R531 Weakness: Secondary | ICD-10-CM | POA: Diagnosis not present

## 2020-06-13 DIAGNOSIS — M1611 Unilateral primary osteoarthritis, right hip: Secondary | ICD-10-CM | POA: Diagnosis not present

## 2020-06-13 DIAGNOSIS — R531 Weakness: Secondary | ICD-10-CM | POA: Diagnosis not present

## 2020-06-13 DIAGNOSIS — M25651 Stiffness of right hip, not elsewhere classified: Secondary | ICD-10-CM | POA: Diagnosis not present

## 2020-06-16 DIAGNOSIS — M25651 Stiffness of right hip, not elsewhere classified: Secondary | ICD-10-CM | POA: Diagnosis not present

## 2020-06-16 DIAGNOSIS — R531 Weakness: Secondary | ICD-10-CM | POA: Diagnosis not present

## 2020-06-16 DIAGNOSIS — M1611 Unilateral primary osteoarthritis, right hip: Secondary | ICD-10-CM | POA: Diagnosis not present

## 2020-06-20 DIAGNOSIS — M1611 Unilateral primary osteoarthritis, right hip: Secondary | ICD-10-CM | POA: Diagnosis not present

## 2020-06-20 DIAGNOSIS — R531 Weakness: Secondary | ICD-10-CM | POA: Diagnosis not present

## 2020-06-20 DIAGNOSIS — M25651 Stiffness of right hip, not elsewhere classified: Secondary | ICD-10-CM | POA: Diagnosis not present

## 2020-06-21 DIAGNOSIS — Z923 Personal history of irradiation: Secondary | ICD-10-CM | POA: Diagnosis not present

## 2020-06-21 DIAGNOSIS — Z9221 Personal history of antineoplastic chemotherapy: Secondary | ICD-10-CM | POA: Diagnosis not present

## 2020-06-21 DIAGNOSIS — Z85810 Personal history of malignant neoplasm of tongue: Secondary | ICD-10-CM | POA: Diagnosis not present

## 2020-06-21 DIAGNOSIS — Z23 Encounter for immunization: Secondary | ICD-10-CM | POA: Diagnosis not present

## 2020-06-21 DIAGNOSIS — Z8521 Personal history of malignant neoplasm of larynx: Secondary | ICD-10-CM | POA: Diagnosis not present

## 2020-06-21 DIAGNOSIS — Z08 Encounter for follow-up examination after completed treatment for malignant neoplasm: Secondary | ICD-10-CM | POA: Diagnosis not present

## 2020-06-21 DIAGNOSIS — C321 Malignant neoplasm of supraglottis: Secondary | ICD-10-CM | POA: Diagnosis not present

## 2020-06-27 ENCOUNTER — Encounter: Payer: Self-pay | Admitting: Radiation Oncology

## 2020-06-27 DIAGNOSIS — M25651 Stiffness of right hip, not elsewhere classified: Secondary | ICD-10-CM | POA: Diagnosis not present

## 2020-06-27 DIAGNOSIS — R531 Weakness: Secondary | ICD-10-CM | POA: Diagnosis not present

## 2020-06-27 DIAGNOSIS — M1611 Unilateral primary osteoarthritis, right hip: Secondary | ICD-10-CM | POA: Diagnosis not present

## 2020-06-28 ENCOUNTER — Telehealth: Payer: Self-pay

## 2020-06-28 NOTE — Telephone Encounter (Signed)
Called patient to see if he saw jury duty exemption letter from [place] sent to his MyChart account. Patient reported he did see it, and that he was able to print a copy out to mail to the judicial office today. Patient denied any other needs at this time, but knows to call clinic back should something else arise.

## 2020-07-04 DIAGNOSIS — M1611 Unilateral primary osteoarthritis, right hip: Secondary | ICD-10-CM | POA: Diagnosis not present

## 2020-07-04 DIAGNOSIS — M25651 Stiffness of right hip, not elsewhere classified: Secondary | ICD-10-CM | POA: Diagnosis not present

## 2020-07-04 DIAGNOSIS — R531 Weakness: Secondary | ICD-10-CM | POA: Diagnosis not present

## 2020-07-05 DIAGNOSIS — D485 Neoplasm of uncertain behavior of skin: Secondary | ICD-10-CM | POA: Diagnosis not present

## 2020-07-05 DIAGNOSIS — C44222 Squamous cell carcinoma of skin of right ear and external auricular canal: Secondary | ICD-10-CM | POA: Diagnosis not present

## 2020-07-05 DIAGNOSIS — L821 Other seborrheic keratosis: Secondary | ICD-10-CM | POA: Diagnosis not present

## 2020-07-05 DIAGNOSIS — C44719 Basal cell carcinoma of skin of left lower limb, including hip: Secondary | ICD-10-CM | POA: Diagnosis not present

## 2020-07-05 DIAGNOSIS — C44519 Basal cell carcinoma of skin of other part of trunk: Secondary | ICD-10-CM | POA: Diagnosis not present

## 2020-07-05 DIAGNOSIS — D045 Carcinoma in situ of skin of trunk: Secondary | ICD-10-CM | POA: Diagnosis not present

## 2020-07-06 DIAGNOSIS — N183 Chronic kidney disease, stage 3 unspecified: Secondary | ICD-10-CM | POA: Diagnosis not present

## 2020-07-06 DIAGNOSIS — K5900 Constipation, unspecified: Secondary | ICD-10-CM | POA: Diagnosis not present

## 2020-07-09 ENCOUNTER — Other Ambulatory Visit: Payer: Self-pay | Admitting: Cardiology

## 2020-08-05 DIAGNOSIS — Z23 Encounter for immunization: Secondary | ICD-10-CM | POA: Diagnosis not present

## 2020-08-25 DIAGNOSIS — L570 Actinic keratosis: Secondary | ICD-10-CM | POA: Diagnosis not present

## 2020-08-25 DIAGNOSIS — C44222 Squamous cell carcinoma of skin of right ear and external auricular canal: Secondary | ICD-10-CM | POA: Diagnosis not present

## 2020-08-31 DIAGNOSIS — C44629 Squamous cell carcinoma of skin of left upper limb, including shoulder: Secondary | ICD-10-CM | POA: Diagnosis not present

## 2020-08-31 DIAGNOSIS — D0471 Carcinoma in situ of skin of right lower limb, including hip: Secondary | ICD-10-CM | POA: Diagnosis not present

## 2020-08-31 DIAGNOSIS — L820 Inflamed seborrheic keratosis: Secondary | ICD-10-CM | POA: Diagnosis not present

## 2020-08-31 DIAGNOSIS — D485 Neoplasm of uncertain behavior of skin: Secondary | ICD-10-CM | POA: Diagnosis not present

## 2020-08-31 DIAGNOSIS — L578 Other skin changes due to chronic exposure to nonionizing radiation: Secondary | ICD-10-CM | POA: Diagnosis not present

## 2020-08-31 DIAGNOSIS — L821 Other seborrheic keratosis: Secondary | ICD-10-CM | POA: Diagnosis not present

## 2020-08-31 DIAGNOSIS — D0462 Carcinoma in situ of skin of left upper limb, including shoulder: Secondary | ICD-10-CM | POA: Diagnosis not present

## 2020-08-31 DIAGNOSIS — L570 Actinic keratosis: Secondary | ICD-10-CM | POA: Diagnosis not present

## 2020-08-31 DIAGNOSIS — D2262 Melanocytic nevi of left upper limb, including shoulder: Secondary | ICD-10-CM | POA: Diagnosis not present

## 2020-08-31 DIAGNOSIS — Z85828 Personal history of other malignant neoplasm of skin: Secondary | ICD-10-CM | POA: Diagnosis not present

## 2020-08-31 DIAGNOSIS — Z85820 Personal history of malignant melanoma of skin: Secondary | ICD-10-CM | POA: Diagnosis not present

## 2020-09-20 NOTE — Progress Notes (Deleted)
HPI: FU CAD; s/p CABG in 1991 and SVG BMS in 2008. CTA July 2020 showed decrease in size of previous 4 mm nodule in the left lower lobe. Thoracic ascending aorta measured 4.2 cm unchanged. Echo 7/21 at [facility] showed normal LV function, mild RVE, mild RAE, mild MR, proximal ascending aorta 4.5 cm. CTA 7/21 at [facility] showed pulmonary emboli but not TAA; RLL lung nodule. Since last seen,  Current Outpatient Medications  Medication Sig Dispense Refill  . amLODipine (NORVASC) 10 MG tablet TAKE ONE TABLET BY MOUTH ONE TIME DAILY 90 tablet 3  . apixaban (ELIQUIS) 2.5 MG TABS tablet Take 2.5 mg by mouth 2 (two) times daily.    Marland Kitchen atorvastatin (LIPITOR) 80 MG tablet TAKE ONE TABLET BY MOUTH ONE TIME DAILY at 6:00pm 90 tablet 2  . Coenzyme Q10 (CO Q 10 PO) Take 200 mg by mouth daily.    Marland Kitchen losartan (COZAAR) 100 MG tablet Take 1 tablet (100 mg total) by mouth daily. 90 tablet 3  . OVER THE COUNTER MEDICATION Take 20 mg by mouth daily. Lutigold    . OVER THE COUNTER MEDICATION Take 2 capsules by mouth daily. Nautrol  Daily vegetable x2 Daily fruit x2    . tretinoin (RETIN-A) 0.025 % cream Apply 1 application topically daily as needed (apply to face rash).      No current facility-administered medications for this visit.   Facility-Administered Medications Ordered in Other Visits  Medication Dose Route Frequency Provider Last Rate Last Admin  . topical emolient (BIAFINE) emulsion   Topical BID Dorothy Puffer, MD   Given at 07/30/11 1405     Past Medical History:  Diagnosis Date  . Abscess of gallbladder   . 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 Basal cell carcinoma of skin   . Broken jaw (HCC)    right lower   . Cholecystitis   . Coronary heart disease    has a stent  . CVA (cerebral infarction) 2007   "mild"  . Fatigue 07/26/2011  . History of cancer chemotherapy   . History of radiation therapy 06/18/2011- 08/03/2011   squamous cell tongue/69.96 Gy 65fx  .  Hypercholesteremia    under control  . Hypertension   . Myocardial infarction (HCC)    approx 2008 or 2009  . Numbness    right lower   . Numbness and tingling in left hand    numbess 2 fingers   . 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  . Peripheral vascular disease (HCC)   . PFO (patent foramen ovale)   . Prostate cancer (HCC) 2002  . Skin cancer    basal cell, squamous cell  . Squamous cell carcinoma of skin   . Stroke [facility])    appro in 08 or 09    Past Surgical History:  Procedure Laterality Date  . CATARACT EXTRACTION W/ INTRAOCULAR LENS  IMPLANT, BILATERAL    . CHOLECYSTECTOMY  04/2009   3 surgeries involved: lap chole x2 laparotomy  . CORONARY ARTERY BYPASS GRAFT  1991   triple  . DIRECT LARYNGOSCOPY N/A 02/17/2020   Procedure: DIRECT LARYNGOSCOPY WITH BIOPSY;  Surgeon: Serena Colonel, MD;  Location: [facility];  Service: ENT;  Laterality: N/A;  . ELBOW SURGERY  42 years ago   removal left radial head due to hockey accident  . EXPLORATORY LAPAROTOMY  04/2010   Dr. Andrey Campanile (3rd surgery for gall bladder)  . INGUINAL HERNIA  REPAIR Right 09/23/2014   Procedure: RIGHT INGUINAL HERNIA REPAIR WITH MESH;  Surgeon: Jackolyn Confer, MD;  [facility];  Service: General;  Laterality: Right;  . INGUINAL HERNIA REPAIR Left 09/30/2015   Procedure: HERNIA REPAIR LEFT INGUINAL ADULT;  Surgeon: Jackolyn Confer, MD;  [facility];  Service: General;  Laterality: Left;  . INSERTION OF MESH Left 09/30/2015   Procedure: INSERTION OF MESH;  Surgeon: Jackolyn Confer, MD;  [facility];  Service: General;  Laterality: Left;  Marland Kitchen MULTIPLE TOOTH EXTRACTIONS     prior to radiation  . PEG TUBE PLACEMENT  06/13/11 at [facility] IR   10/2011 removed  . PROSTATECTOMY  2002  . TONSILLECTOMY     x2: as child then 1985 for Left lingual tonsil removed  . TOTAL HIP ARTHROPLASTY Left 02/08/2016   Procedure: TOTAL HIP ARTHROPLASTY ANTERIOR APPROACH;  Surgeon: Frederik Pear, MD;  Location: [place];  Service:  Orthopedics;  Laterality: Left;  . ULNAR NERVE TRANSPOSITION Left 10/16/2018   Procedure: ULNAR NERVE DECOMPRESSION/TRANSPOSITION;  Surgeon: [name], MD;  Location: [place];  Service: Orthopedics;  Laterality: Left;    Social History   Socioeconomic History  . Marital status: Married    Spouse name: Not on file  . Number of children: Not on file  . Years of education: Not on file  . Highest education level: Not on file  Occupational History  . Not on file  Tobacco Use  . Smoking status: Former Smoker    Packs/day: 0.25    Years: 5.00    Pack years: 1.25    Types: Cigarettes    Quit date: 03/20/1966    Years since quitting: 54.5  . Smokeless tobacco: Never Used  Vaping Use  . Vaping Use: Never used  Substance and Sexual Activity  . Alcohol use: Yes    Alcohol/week: 1.0 standard drink    Types: 1 Glasses of wine per week  . Drug use: No  . Sexual activity: Not on file  Other Topics Concern  . Not on file  Social History Narrative  . Not on file   Social Determinants of Health   Financial Resource Strain: Not on file  Food Insecurity: Not on file  Transportation Needs: Not on file  Physical Activity: Not on file  Stress: Not on file  Social Connections: Not on file  Intimate Partner Violence: Not on file    Family History  Problem Relation Age of Onset  . Stroke Mother   . Coronary artery disease Brother        had bypass    ROS: no fevers or chills, productive cough, hemoptysis, dysphasia, odynophagia, melena, hematochezia, dysuria, hematuria, rash, seizure activity, orthopnea, PND, pedal edema, claudication. Remaining systems are negative.  Physical Exam: Well-developed well-nourished in no acute distress.  Skin is warm and dry.  HEENT is normal.  Neck is supple.  Chest is clear to auscultation with normal expansion.  Cardiovascular exam is regular rate and rhythm.  Abdominal exam nontender or distended. No masses palpated. Extremities show no  edema. neuro grossly intact  ECG- personally reviewed  A/P  1 CAD-no CP; continue medical therapy with ASA and statin.  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  3 hypertension-BP controlled; continue present meds  4 hyperlipidemia-continue statin.  5 prior CVA-h/o patent foramen ovale but no recurrences; continue ASA.  6 h/o pulmonary embolus  Kirk Ruths, MD

## 2020-09-27 DIAGNOSIS — Z85810 Personal history of malignant neoplasm of tongue: Secondary | ICD-10-CM | POA: Diagnosis not present

## 2020-09-27 DIAGNOSIS — C321 Malignant neoplasm of supraglottis: Secondary | ICD-10-CM | POA: Diagnosis not present

## 2020-10-03 ENCOUNTER — Encounter: Payer: Self-pay | Admitting: *Deleted

## 2020-10-03 NOTE — Progress Notes (Signed)
Virtual Visit via Video Note   This visit type was conducted due to national recommendations for restrictions regarding the COVID-19 Pandemic (e.g. social distancing) in an effort to limit this patient's exposure and mitigate transmission in our community.  Due to his co-morbid illnesses, this patient is at least at moderate risk for complications without adequate follow up.  This format is felt to be most appropriate for this patient at this time.  All issues noted in this document were discussed and addressed.  A limited physical exam was performed with this format.  Please refer to the patient's chart for his consent to telehealth for [facility].      Date:  10/04/2020   ID:  Jason Noble, DOB 1941-04-15, MRN [id number]  Patient Location:Home Provider Location: Home  PCP:  Shirline Frees, MD  Cardiologist:  Dr Stanford Breed  Evaluation Performed:  Follow-Up Visit  Chief Complaint:  FU CAD  History of Present Illness:    FU CAD; s/p CABG in 1991 and SVG BMS in 2008. CTA July 2020 showed decrease in size of previous 4 mm nodule in the left lower lobe. Thoracic ascending aorta measured 4.2 cm unchanged.  Echocardiogram July 2021 showed normal LV function, mild right ventricular enlargement, mild right atrial enlargement, mild mitral regurgitation, ascending aorta measuring 4.5 cm.  CTA July 2021 showed pulmonary embolus.  Also with laryngeal cancer.  Since last seen, the patient denies any dyspnea on exertion, orthopnea, PND, pedal edema, palpitations, syncope or chest pain.   The patient does not have symptoms concerning for COVID-19 infection (fever, chills, cough, or new shortness of breath).    Past Medical History:  Diagnosis Date  . Abscess of gallbladder   . 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 Basal cell carcinoma of skin   . Broken jaw ([place])    right lower   . Cholecystitis   . Coronary heart disease    has a stent  . CVA (cerebral infarction) 2007    "mild"  . Fatigue 07/26/2011  . History of cancer chemotherapy   . History of radiation therapy 06/18/2011- 08/03/2011   squamous cell tongue/69.96 Gy 94fx  . Hypercholesteremia    under control  . Hypertension   . Myocardial infarction ([place])    approx 2008 or 2009  . Numbness    right lower   . Numbness and tingling in left hand    numbess 2 fingers   . 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 Peripheral vascular disease ([place])   . PFO (patent foramen ovale)   . Prostate cancer ([place]) 2002  . Skin cancer    basal cell, squamous cell  . Squamous cell carcinoma of skin   . Stroke [facility])    appro in 08 or 09   Past Surgical History:  Procedure Laterality Date  . CATARACT EXTRACTION W/ INTRAOCULAR LENS  IMPLANT, BILATERAL    . CHOLECYSTECTOMY  04/2009   3 surgeries involved: lap chole x2 laparotomy  . CORONARY ARTERY BYPASS GRAFT  1991   triple  . DIRECT LARYNGOSCOPY N/A 02/17/2020   Procedure: DIRECT LARYNGOSCOPY WITH BIOPSY;  Surgeon: Izora Gala, MD;  Location: [place];  Service: ENT;  Laterality: N/A;  . ELBOW SURGERY  42 years ago   removal left radial head due to hockey accident  . EXPLORATORY LAPAROTOMY  04/2010   Dr. Redmond Pulling (3rd surgery for gall bladder)  . INGUINAL HERNIA REPAIR Right 09/23/2014  Procedure: RIGHT INGUINAL HERNIA REPAIR WITH MESH;  Surgeon: Jackolyn Confer, MD;  [facility];  Service: General;  Laterality: Right;  . INGUINAL HERNIA REPAIR Left 09/30/2015   Procedure: HERNIA REPAIR LEFT INGUINAL ADULT;  Surgeon: Jackolyn Confer, MD;  [facility];  Service: General;  Laterality: Left;  . INSERTION OF MESH Left 09/30/2015   Procedure: INSERTION OF MESH;  Surgeon: Jackolyn Confer, MD;  [facility];  Service: General;  Laterality: Left;  Marland Kitchen MULTIPLE TOOTH EXTRACTIONS     prior to radiation  . PEG TUBE PLACEMENT  06/13/11 at [facility] IR   10/2011 removed  . PROSTATECTOMY  2002  . TONSILLECTOMY     x2: as child then 1985 for Left lingual tonsil  removed  . TOTAL HIP ARTHROPLASTY Left 02/08/2016   Procedure: TOTAL HIP ARTHROPLASTY ANTERIOR APPROACH;  Surgeon: Frederik Pear, MD;  Location: [place];  Service: Orthopedics;  Laterality: Left;  . ULNAR NERVE TRANSPOSITION Left 10/16/2018   Procedure: ULNAR NERVE DECOMPRESSION/TRANSPOSITION;  Surgeon: [name], MD;  Location: [place];  Service: Orthopedics;  Laterality: Left;     Current Meds  Medication Sig  . amLODipine (NORVASC) 10 MG tablet TAKE ONE TABLET BY MOUTH ONE TIME DAILY  . atorvastatin (LIPITOR) 80 MG tablet TAKE ONE TABLET BY MOUTH ONE TIME DAILY at 6:00pm  . Coenzyme Q10 (CO Q 10 PO) Take 200 mg by mouth daily.  Marland Kitchen losartan (COZAAR) 100 MG tablet 1 tablet  . OVER THE COUNTER MEDICATION Take 20 mg by mouth daily. Lutigold  . OVER THE COUNTER MEDICATION Take 2 capsules by mouth daily. Nautrol  Daily vegetable x2 Daily fruit x2  . tretinoin (RETIN-A) 0.025 % cream Apply 1 application topically daily as needed (apply to face rash).      Allergies:   Patient has no known allergies.   Social History   Tobacco Use  . Smoking status: Former Smoker    Packs/day: 0.25    Years: 5.00    Pack years: 1.25    Types: Cigarettes    Quit date: 03/20/1966    Years since quitting: 54.5  . Smokeless tobacco: Never Used  Vaping Use  . Vaping Use: Never used  Substance Use Topics  . Alcohol use: Yes    Alcohol/week: 1.0 standard drink    Types: 1 Glasses of wine per week  . Drug use: No     Family Hx: The patient's family history includes Coronary artery disease in his brother; Stroke in his mother.  ROS:   Please see the history of present illness.    No Fever, chills  or productive cough All other systems reviewed and are negative.  Recent Labs: 10/30/2019: ALT 18 02/11/2020: BUN 30; Creatinine, Ser 1.15; Hemoglobin 13.4; Platelets 160; Potassium 4.1; Sodium 140   Recent Lipid Panel Lab Results  Component Value Date/Time   CHOL 168 10/30/2019 09:14 AM   TRIG 70  10/30/2019 09:14 AM   HDL 73 10/30/2019 09:14 AM   CHOLHDL 2.3 10/30/2019 09:14 AM   CHOLHDL 2.1 12/28/2016 09:23 AM   LDLCALC 82 10/30/2019 09:14 AM   LDLDIRECT 168.1 12/15/2012 03:39 PM    Wt Readings from Last 3 Encounters:  10/04/20 170 lb (77.1 kg)  03/08/20 183 lb 2 oz (83.1 kg)  02/17/20 180 lb (81.6 kg)     Objective:    Vital Signs:  BP (!) 148/83   Pulse 64   Ht 5\' 9"  (1.753 m)   Wt 170 lb (77.1 kg)  BMI 25.10 kg/m    VITAL SIGNS:  reviewed NAD Answers questions appropriately Normal affect Remainder of physical examination not performed (telehealth visit; coronavirus pandemic)  ASSESSMENT & PLAN:    1. Coronary artery disease-patient doing well with no chest pain.  Plan to continue medical therapy with aspirin and statin. 2. Thoracic aortic aneurysm-plan follow-up CTA. 3. Hypertension-patient's blood pressure is elevated; add hydrochlorothiazide 12.5 mg daily.  Check potassium and renal function in 1 week.  Follow blood pressure and adjust regimen as needed. 4. Hyperlipidemia-continue statin.  Check lipids and liver. 5. Prior CVA-patient does have a history of patent foramen ovale but has had no recurrences.  Continue aspirin. 6. Pulmonary embolus-patient has been treated with apixaban.  Now back on aspirin.  COVID-19 Education: The importance of social distancing was discussed today.  Time:   Today, I have spent 15 minutes with the patient with telehealth technology discussing the above problems.     Medication Adjustments/Labs and Tests Ordered: Current medicines are reviewed at length with the patient today.  Concerns regarding medicines are outlined above.   Tests Ordered: No orders of the defined types were placed in this encounter.   Medication Changes: No orders of the defined types were placed in this encounter.   Follow Up:  In Person in 1 year(s)  Signed, Kirk Ruths, MD  10/04/2020 10:17 AM    [place]

## 2020-10-04 ENCOUNTER — Encounter: Payer: Self-pay | Admitting: Cardiology

## 2020-10-04 ENCOUNTER — Ambulatory Visit: Payer: Medicare Other | Admitting: Cardiology

## 2020-10-04 ENCOUNTER — Telehealth (INDEPENDENT_AMBULATORY_CARE_PROVIDER_SITE_OTHER): Payer: Medicare Other | Admitting: Cardiology

## 2020-10-04 VITALS — BP 148/83 | HR 64 | Ht 69.0 in | Wt 170.0 lb

## 2020-10-04 DIAGNOSIS — I2510 Atherosclerotic heart disease of native coronary artery without angina pectoris: Secondary | ICD-10-CM | POA: Diagnosis not present

## 2020-10-04 DIAGNOSIS — I77810 Thoracic aortic ectasia: Secondary | ICD-10-CM | POA: Diagnosis not present

## 2020-10-04 DIAGNOSIS — E785 Hyperlipidemia, unspecified: Secondary | ICD-10-CM

## 2020-10-04 DIAGNOSIS — D0462 Carcinoma in situ of skin of left upper limb, including shoulder: Secondary | ICD-10-CM | POA: Diagnosis not present

## 2020-10-04 DIAGNOSIS — L570 Actinic keratosis: Secondary | ICD-10-CM | POA: Diagnosis not present

## 2020-10-04 DIAGNOSIS — Z951 Presence of aortocoronary bypass graft: Secondary | ICD-10-CM | POA: Diagnosis not present

## 2020-10-04 DIAGNOSIS — D0471 Carcinoma in situ of skin of right lower limb, including hip: Secondary | ICD-10-CM | POA: Diagnosis not present

## 2020-10-04 DIAGNOSIS — I10 Essential (primary) hypertension: Secondary | ICD-10-CM

## 2020-10-04 DIAGNOSIS — D485 Neoplasm of uncertain behavior of skin: Secondary | ICD-10-CM | POA: Diagnosis not present

## 2020-10-04 DIAGNOSIS — C44319 Basal cell carcinoma of skin of other parts of face: Secondary | ICD-10-CM | POA: Diagnosis not present

## 2020-10-04 MED ORDER — HYDROCHLOROTHIAZIDE 12.5 MG PO CAPS
12.5000 mg | ORAL_CAPSULE | Freq: Every day | ORAL | 3 refills | Status: DC
Start: 2020-10-04 — End: 2021-09-05

## 2020-10-04 NOTE — Patient Instructions (Signed)
Medication Instructions:  START hydrochlorithiazide (HCTZ) 12.5 mg daily  *If you need a refill on your cardiac medications before your next appointment, please call your pharmacy*   Lab Work: Please return for FASTING labs in 1 week (BMET, Lipid, Hepatic)  Our in office lab hours are Monday-Friday 8:00-4:00, closed for lunch 12:45-1:45 pm.  No appointment needed.  Testing/Procedures: CTA chest/aorta-a scheduler will call to arrange this.  Follow-Up: At [facility], you and your health needs are our priority.  As part of our continuing mission to provide you with exceptional heart care, we have created designated Provider Care Teams.  These Care Teams include your primary Cardiologist (physician) and Advanced Practice Providers (APPs -  Physician Assistants and Nurse Practitioners) who all work together to provide you with the care you need, when you need it.  We recommend signing up for the patient portal called "MyChart".  Sign up information is provided on this After Visit Summary.  MyChart is used to connect with patients for Virtual Visits (Telemedicine).  Patients are able to view lab/test results, encounter notes, upcoming appointments, etc.  Non-urgent messages can be sent to your provider as well.   To learn more about what you can do with MyChart, go to [email or web address].    Your next appointment:   6 month(s)  The format for your next appointment:   In Person  Provider:   Kirk Ruths, MD

## 2020-10-05 ENCOUNTER — Other Ambulatory Visit: Payer: Self-pay | Admitting: Cardiology

## 2020-10-05 ENCOUNTER — Telehealth: Payer: Self-pay | Admitting: Cardiology

## 2020-10-05 NOTE — Telephone Encounter (Signed)
Left message for patient to call and discuss scheduling the CTA chest/aorta ordered by Dr. Stanford Breed

## 2020-10-06 DIAGNOSIS — C44629 Squamous cell carcinoma of skin of left upper limb, including shoulder: Secondary | ICD-10-CM | POA: Diagnosis not present

## 2020-10-07 DIAGNOSIS — C44629 Squamous cell carcinoma of skin of left upper limb, including shoulder: Secondary | ICD-10-CM | POA: Diagnosis not present

## 2020-10-10 ENCOUNTER — Telehealth: Payer: Self-pay | Admitting: Cardiology

## 2020-10-10 NOTE — Telephone Encounter (Signed)
Attempted to call patient, left message for patient to call back to office.   Will route to PharmD on advice on best time of day to take listed medications and if they can all be taken at the same time.

## 2020-10-10 NOTE — Telephone Encounter (Signed)
    Pt c/o medication issue:  1. Name of Medication:   amLODipine (NORVASC) 10 MG tablet    hydrochlorothiazide (MICROZIDE) 12.5 MG capsule    losartan (COZAAR) 100 MG tablet    2. How are you currently taking this medication (dosage and times per day)?   3. Are you having a reaction (difficulty breathing--STAT)?   4. What is your medication issue? Pt would like to know when is the best time of the day to take these medications. And if he should take all at the same time.

## 2020-10-10 NOTE — Telephone Encounter (Signed)
Left message for patient to cal and discuss scheduling the CTA chest/aorta ordered by Dr. Stanford Breed

## 2020-10-10 NOTE — Telephone Encounter (Signed)
   *  STAT* If patient is at the pharmacy, call can be transferred to refill team.   1. Which medications need to be refilled? (please list name of each medication and dose if known)   losartan (COZAAR) 100 MG tablet    2. Which pharmacy/location (including street and city if local pharmacy) is medication to be sent to? [facility] # [place], [place]  3. Do they need a 30 day or 90 day supply? 90 days   Pt said per [facility] they did not received refill for losartan. Please resent refill

## 2020-10-10 NOTE — Telephone Encounter (Signed)
The only one that is important to take in the morning is the hydrochlorothiazide because it is a diuretic. The losartan or amlodipine can be taken in the morning or evening, whichever patient prefers.  IT is safe to take them all at the same time.

## 2020-10-11 MED ORDER — LOSARTAN POTASSIUM 100 MG PO TABS: 100.0000 mg | ORAL_TABLET | Freq: Every day | ORAL | 3 refills | Status: DC

## 2020-10-11 NOTE — Telephone Encounter (Signed)
Rx has been sent to the pharmacy electronically. ° °

## 2020-10-12 NOTE — Telephone Encounter (Signed)
Attempted to call patient, left message for patient to call back to office.   

## 2020-10-13 NOTE — Telephone Encounter (Signed)
Left message for patient to call and discuss scheduling the CTA chest/aorta ordered by Dr. Stanford Breed

## 2020-10-14 NOTE — Telephone Encounter (Signed)
Attempted to call the patient in regards to pharmacy recommendations for taking the HCTZ, amlodipine, and losartan.  No answer- I did leave a detailed message on his identified voice mail (ok per DPR) of Gerald Stabs, Pharm D's recommendations: - HCTZ to be taken in the AM - Losartan/ amlodipine can be AM/ PM - All 3 can be taken together.  I asked that he call back with any further questions/ concerns.  I does not look like the patient has returned a call to [place].  Will forward this message to her to complete when the CT scheduled.

## 2020-10-19 DIAGNOSIS — C44319 Basal cell carcinoma of skin of other parts of face: Secondary | ICD-10-CM | POA: Diagnosis not present

## 2020-10-27 NOTE — Telephone Encounter (Signed)
Left message for patient to call and discuss scheduling the CTA chest/aorta ordered by Dr. Stanford Breed

## 2020-11-01 ENCOUNTER — Encounter: Payer: Self-pay | Admitting: *Deleted

## 2020-11-03 ENCOUNTER — Encounter: Payer: Self-pay | Admitting: Cardiology

## 2020-11-03 NOTE — Telephone Encounter (Signed)
Left message for patient to call and discuss scheduling the CTA chest/aorta ordered by Dr. Stanford Breed.  Will also mail letter to patient requesting he call

## 2020-11-11 ENCOUNTER — Telehealth: Payer: Self-pay | Admitting: Cardiology

## 2020-11-11 DIAGNOSIS — I712 Thoracic aortic aneurysm, without rupture, unspecified: Secondary | ICD-10-CM

## 2020-11-11 NOTE — Telephone Encounter (Signed)
Order placed

## 2020-11-11 NOTE — Telephone Encounter (Signed)
     Pt said he received a letter in the mail, he said it's an instruction to schedule his CT. He said he wants his CT to be done at [facility], he said it's easier for him to go there than [place], he gave fax# of [facility] [phone] to send an order there for his CT

## 2020-11-11 NOTE — Telephone Encounter (Signed)
Left message for patient regarding 12/01/20 2:30 pm CTA chest Kem Kays scheduled appointment at [facility].  Arrival time is 2:15 pm 1st floor radiology department---liquids only 4  Hours prior to study---patient instructed to have lab work drawn Friday 11/25/20 at Dr. Jacalyn Lefevre office   Will mail information to patient and patient was instructed to call with questions.

## 2020-12-01 ENCOUNTER — Ambulatory Visit (HOSPITAL_COMMUNITY): Admission: RE | Admit: 2020-12-01 | Payer: Medicare Other | Source: Ambulatory Visit

## 2020-12-07 DIAGNOSIS — D485 Neoplasm of uncertain behavior of skin: Secondary | ICD-10-CM | POA: Diagnosis not present

## 2020-12-07 DIAGNOSIS — Z85828 Personal history of other malignant neoplasm of skin: Secondary | ICD-10-CM | POA: Diagnosis not present

## 2020-12-07 DIAGNOSIS — Z85820 Personal history of malignant melanoma of skin: Secondary | ICD-10-CM | POA: Diagnosis not present

## 2020-12-07 DIAGNOSIS — D0461 Carcinoma in situ of skin of right upper limb, including shoulder: Secondary | ICD-10-CM | POA: Diagnosis not present

## 2020-12-07 DIAGNOSIS — C44519 Basal cell carcinoma of skin of other part of trunk: Secondary | ICD-10-CM | POA: Diagnosis not present

## 2020-12-07 DIAGNOSIS — L578 Other skin changes due to chronic exposure to nonionizing radiation: Secondary | ICD-10-CM | POA: Diagnosis not present

## 2020-12-07 DIAGNOSIS — L821 Other seborrheic keratosis: Secondary | ICD-10-CM | POA: Diagnosis not present

## 2020-12-07 DIAGNOSIS — L570 Actinic keratosis: Secondary | ICD-10-CM | POA: Diagnosis not present

## 2020-12-07 DIAGNOSIS — D2262 Melanocytic nevi of left upper limb, including shoulder: Secondary | ICD-10-CM | POA: Diagnosis not present

## 2020-12-22 DIAGNOSIS — D0461 Carcinoma in situ of skin of right upper limb, including shoulder: Secondary | ICD-10-CM | POA: Diagnosis not present

## 2020-12-29 ENCOUNTER — Other Ambulatory Visit: Payer: Self-pay

## 2020-12-29 ENCOUNTER — Ambulatory Visit (HOSPITAL_COMMUNITY)
Admission: RE | Admit: 2020-12-29 | Discharge: 2020-12-29 | Disposition: A | Discharge status: Home / Self Care | Payer: Medicare Other | Source: Ambulatory Visit | Attending: Cardiology | Admitting: Cardiology

## 2020-12-29 DIAGNOSIS — C4491 Basal cell carcinoma of skin, unspecified: Secondary | ICD-10-CM | POA: Diagnosis not present

## 2020-12-29 DIAGNOSIS — C44612 Basal cell carcinoma of skin of right upper limb, including shoulder: Secondary | ICD-10-CM | POA: Diagnosis not present

## 2020-12-29 DIAGNOSIS — I712 Thoracic aortic aneurysm, without rupture, unspecified: Secondary | ICD-10-CM

## 2020-12-29 DIAGNOSIS — Z951 Presence of aortocoronary bypass graft: Secondary | ICD-10-CM | POA: Diagnosis not present

## 2020-12-29 DIAGNOSIS — J984 Other disorders of lung: Secondary | ICD-10-CM | POA: Diagnosis not present

## 2020-12-29 LAB — POCT I-STAT CREATININE: Creatinine, Ser: 1.4 mg/dL — ABNORMAL HIGH (ref 0.61–1.24)

## 2020-12-29 MED ORDER — IOHEXOL 350 MG/ML SOLN
100.0000 mL | Freq: Once | INTRAVENOUS | Status: AC | PRN
  Administered 2020-12-29: 100 mL via INTRAVENOUS

## 2021-01-24 DIAGNOSIS — Z8521 Personal history of malignant neoplasm of larynx: Secondary | ICD-10-CM | POA: Diagnosis not present

## 2021-01-24 DIAGNOSIS — Z9889 Other specified postprocedural states: Secondary | ICD-10-CM | POA: Diagnosis not present

## 2021-01-24 DIAGNOSIS — Z923 Personal history of irradiation: Secondary | ICD-10-CM | POA: Diagnosis not present

## 2021-01-24 DIAGNOSIS — Z9221 Personal history of antineoplastic chemotherapy: Secondary | ICD-10-CM | POA: Diagnosis not present

## 2021-01-24 DIAGNOSIS — Z85818 Personal history of malignant neoplasm of other sites of lip, oral cavity, and pharynx: Secondary | ICD-10-CM | POA: Diagnosis not present

## 2021-01-24 DIAGNOSIS — L598 Other specified disorders of the skin and subcutaneous tissue related to radiation: Secondary | ICD-10-CM | POA: Diagnosis not present

## 2021-01-24 DIAGNOSIS — Z08 Encounter for follow-up examination after completed treatment for malignant neoplasm: Secondary | ICD-10-CM | POA: Diagnosis not present

## 2021-01-24 DIAGNOSIS — Z85810 Personal history of malignant neoplasm of tongue: Secondary | ICD-10-CM | POA: Diagnosis not present

## 2021-01-24 DIAGNOSIS — R1312 Dysphagia, oropharyngeal phase: Secondary | ICD-10-CM | POA: Diagnosis not present

## 2021-01-26 DIAGNOSIS — L570 Actinic keratosis: Secondary | ICD-10-CM | POA: Diagnosis not present

## 2021-02-07 DIAGNOSIS — M25775 Osteophyte, left foot: Secondary | ICD-10-CM | POA: Diagnosis not present

## 2021-02-07 DIAGNOSIS — L600 Ingrowing nail: Secondary | ICD-10-CM | POA: Diagnosis not present

## 2021-02-07 DIAGNOSIS — M25774 Osteophyte, right foot: Secondary | ICD-10-CM | POA: Diagnosis not present

## 2021-02-21 ENCOUNTER — Other Ambulatory Visit: Payer: Self-pay | Admitting: Gastroenterology

## 2021-02-21 DIAGNOSIS — Z86010 Personal history of colonic polyps: Secondary | ICD-10-CM | POA: Diagnosis not present

## 2021-02-21 DIAGNOSIS — K5900 Constipation, unspecified: Secondary | ICD-10-CM | POA: Diagnosis not present

## 2021-02-21 DIAGNOSIS — K5140 Inflammatory polyps of colon without complications: Secondary | ICD-10-CM

## 2021-02-22 DIAGNOSIS — L600 Ingrowing nail: Secondary | ICD-10-CM | POA: Diagnosis not present

## 2021-03-11 ENCOUNTER — Other Ambulatory Visit: Payer: Self-pay | Admitting: Cardiology

## 2021-03-16 ENCOUNTER — Other Ambulatory Visit: Payer: Medicare Other

## 2021-03-17 ENCOUNTER — Ambulatory Visit
Admission: RE | Admit: 2021-03-17 | Discharge: 2021-03-17 | Disposition: A | Discharge status: Home / Self Care | Payer: Medicare Other | Source: Ambulatory Visit | Attending: Gastroenterology | Admitting: Gastroenterology

## 2021-03-17 DIAGNOSIS — K5900 Constipation, unspecified: Secondary | ICD-10-CM | POA: Diagnosis not present

## 2021-03-17 DIAGNOSIS — K5730 Diverticulosis of large intestine without perforation or abscess without bleeding: Secondary | ICD-10-CM | POA: Diagnosis not present

## 2021-03-17 DIAGNOSIS — K5140 Inflammatory polyps of colon without complications: Secondary | ICD-10-CM

## 2021-03-29 DIAGNOSIS — L244 Irritant contact dermatitis due to drugs in contact with skin: Secondary | ICD-10-CM | POA: Diagnosis not present

## 2021-04-11 NOTE — Progress Notes (Deleted)
HPI: FU CAD; s/p CABG in 1991 and SVG BMS in 2008. Echocardiogram July 2021 showed normal LV function, mild right ventricular enlargement, mild right atrial enlargement, mild mitral regurgitation, ascending aorta measuring 4.5 cm. Also with laryngeal cancer.  Seen PA April 2022 showed 4.3 cm thoracic aortic aneurysm.  Since last seen,   Current Outpatient Medications  Medication Sig Dispense Refill   amLODipine (NORVASC) 10 MG tablet TAKE ONE TABLET BY MOUTH ONE TIME DAILY 90 tablet 3   atorvastatin (LIPITOR) 80 MG tablet TAKE ONE TABLET BY MOUTH ONE TIME DAILY AT 6 PM 90 tablet 3   Coenzyme Q10 (CO Q 10 PO) Take 200 mg by mouth daily.     hydrochlorothiazide (MICROZIDE) 12.5 MG capsule Take 1 capsule (12.5 mg total) by mouth daily. 90 capsule 3   losartan (COZAAR) 100 MG tablet Take 1 tablet (100 mg total) by mouth daily. 90 tablet 3   OVER THE COUNTER MEDICATION Take 20 mg by mouth daily. Lutigold     OVER THE COUNTER MEDICATION Take 2 capsules by mouth daily. Nautrol  Daily vegetable x2 Daily fruit x2     tretinoin (RETIN-A) 0.025 % cream Apply 1 application topically daily as needed (apply to face rash).      No current facility-administered medications for this visit.   Facility-Administered Medications Ordered in Other Visits  Medication Dose Route Frequency Provider Last Rate Last Admin   topical emolient (BIAFINE) emulsion   Topical BID Kyung Rudd, MD   Given at 07/30/11 1405     Past Medical History:  Diagnosis Date   Abscess of gallbladder    Aneurysm of thoracic aorta (HCC)    4.5 cm    Basal cell carcinoma of skin    Broken jaw ([place])    right lower    Cholecystitis    Coronary heart disease    has a stent   CVA (cerebral infarction) 2007   "mild"   Fatigue 07/26/2011   History of cancer chemotherapy    History of radiation therapy 06/18/2011- 08/03/2011   squamous cell tongue/69.96 Gy 28f   Hypercholesteremia    under control   Hypertension     [place]ocardial infarction ([place]    approx 2008 or 2009   Numbness    right lower    Numbness and tingling in left hand    numbess 2 fingers    [place]arynx [place]   HPV positive   Peripheral vascular disease (HCC)    PFO (patent foramen ovale)    Prostate cancer ([place] 2002   Skin cancer    basal cell, squamous cell   Squamous cell carcinoma of skin    Stroke ([place]    appro in 08 or 09    Past Surgical History:  Procedure Laterality Date   CATARACT EXTRACTION W/ INTRAOCULAR LENS  IMPLANT, BILATERAL     CHOLECYSTECTOMY  04/2009   3 surgeries involved: lap chole x2 laparotomy   CORONARY ARTERY BYPASS GRAFT  1991   triple   DIRECT LARYNGOSCOPY N/A 02/17/2020   Procedure: DIRECT LARYNGOSCOPY WITH BIOPSY;  Surgeon: RIzora Gala MD;  [place]  Service: ENT;  Laterality: N/A;   ELBOW SURGERY  42 years ago   removal left radial head due to hockey accident   EXPLORATORY LAPAROTOMY  04/2010   Dr. WRedmond Pulling(3rd surgery for gall bladder)   INGUINAL HERNIA REPAIR Right 09/23/2014   Procedure: RIGHT INGUINAL HERNIA REPAIR WITH MESH;  Surgeon: TSherren Mocha  Rosenbower, MD;  [facility];  Service: General;  Laterality: Right;   INGUINAL HERNIA REPAIR Left 09/30/2015   Procedure: HERNIA REPAIR LEFT INGUINAL ADULT;  Surgeon: Jackolyn Confer, MD;  [facility];  Service: General;  Laterality: Left;   INSERTION OF MESH Left 09/30/2015   Procedure: INSERTION OF MESH;  Surgeon: Jackolyn Confer, MD;  [facility];  Service: General;  Laterality: Left;   MULTIPLE TOOTH EXTRACTIONS     prior to radiation   PEG TUBE PLACEMENT  06/13/11 at [facility] IR   10/2011 removed   PROSTATECTOMY  2002   TONSILLECTOMY     x2: as child then 1985 for Left lingual tonsil removed   TOTAL HIP ARTHROPLASTY Left 02/08/2016   Procedure: TOTAL HIP ARTHROPLASTY ANTERIOR APPROACH;  Surgeon: Frederik Pear, MD;  Location: [place];  Service: Orthopedics;  Laterality: Left;   ULNAR NERVE TRANSPOSITION Left 10/16/2018   Procedure:  ULNAR NERVE DECOMPRESSION/TRANSPOSITION;  Surgeon: [name], MD;  Location: [place];  Service: Orthopedics;  Laterality: Left;    Social History   Socioeconomic History   Marital status: Married    Spouse name: Not on file   Number of children: Not on file   Years of education: Not on file   Highest education level: Not on file  Occupational History   Not on file  Tobacco Use   Smoking status: Former    Packs/day: 0.25    Years: 5.00    Pack years: 1.25    Types: Cigarettes    Quit date: 03/20/1966    Years since quitting: 55.0   Smokeless tobacco: Never  Vaping Use   Vaping Use: Never used  Substance and Sexual Activity   Alcohol use: Yes    Alcohol/week: 1.0 standard drink    Types: 1 Glasses of wine per week   Drug use: No   Sexual activity: Not on file  Other Topics Concern   Not on file  Social History Narrative   Not on file   Social Determinants of Health   Financial Resource Strain: Not on file  Food Insecurity: Not on file  Transportation Needs: Not on file  Physical Activity: Not on file  Stress: Not on file  Social Connections: Not on file  Intimate Partner Violence: Not on file    Family History  Problem Relation Age of Onset   Stroke Mother    Coronary artery disease Brother        had bypass    ROS: no fevers or chills, productive cough, hemoptysis, dysphasia, odynophagia, melena, hematochezia, dysuria, hematuria, rash, seizure activity, orthopnea, PND, pedal edema, claudication. Remaining systems are negative.  Physical Exam: Well-developed well-nourished in no acute distress.  Skin is warm and dry.  HEENT is normal.  Neck is supple.  Chest is clear to auscultation with normal expansion.  Cardiovascular exam is regular rate and rhythm.  Abdominal exam nontender or distended. No masses palpated. Extremities show no edema. neuro grossly intact  ECG- personally reviewed  A/P  1 coronary artery disease-patient denies chest pain.   Continue aspirin and statin.  2 thoracic aortic aneurysm-we will arrange follow-up CTA April 2023.  3 hypertension-blood pressure controlled.  Continue present medications.  Check potassium and renal function.  4 hyperlipidemia-continue statin.  Check lipids and liver.  5 history of CVA-patient noted to have a patent foramen ovale previously but he has had no recurrences.  We will continue aspirin.  Kirk Ruths, MD

## 2021-04-24 ENCOUNTER — Ambulatory Visit: Payer: Medicare Other | Admitting: Cardiology

## 2021-04-25 DIAGNOSIS — K5900 Constipation, unspecified: Secondary | ICD-10-CM | POA: Diagnosis not present

## 2021-05-19 DIAGNOSIS — D0462 Carcinoma in situ of skin of left upper limb, including shoulder: Secondary | ICD-10-CM | POA: Diagnosis not present

## 2021-05-19 DIAGNOSIS — L578 Other skin changes due to chronic exposure to nonionizing radiation: Secondary | ICD-10-CM | POA: Diagnosis not present

## 2021-05-19 DIAGNOSIS — L570 Actinic keratosis: Secondary | ICD-10-CM | POA: Diagnosis not present

## 2021-05-19 DIAGNOSIS — Z85828 Personal history of other malignant neoplasm of skin: Secondary | ICD-10-CM | POA: Diagnosis not present

## 2021-05-19 DIAGNOSIS — Z85820 Personal history of malignant melanoma of skin: Secondary | ICD-10-CM | POA: Diagnosis not present

## 2021-05-19 DIAGNOSIS — L821 Other seborrheic keratosis: Secondary | ICD-10-CM | POA: Diagnosis not present

## 2021-05-19 DIAGNOSIS — D485 Neoplasm of uncertain behavior of skin: Secondary | ICD-10-CM | POA: Diagnosis not present

## 2021-05-30 DIAGNOSIS — Z85810 Personal history of malignant neoplasm of tongue: Secondary | ICD-10-CM | POA: Diagnosis not present

## 2021-05-30 DIAGNOSIS — Z08 Encounter for follow-up examination after completed treatment for malignant neoplasm: Secondary | ICD-10-CM | POA: Diagnosis not present

## 2021-05-30 DIAGNOSIS — Z8589 Personal history of malignant neoplasm of other organs and systems: Secondary | ICD-10-CM | POA: Diagnosis not present

## 2021-05-30 DIAGNOSIS — Z8521 Personal history of malignant neoplasm of larynx: Secondary | ICD-10-CM | POA: Diagnosis not present

## 2021-05-30 DIAGNOSIS — Z923 Personal history of irradiation: Secondary | ICD-10-CM | POA: Diagnosis not present

## 2021-05-30 DIAGNOSIS — L598 Other specified disorders of the skin and subcutaneous tissue related to radiation: Secondary | ICD-10-CM | POA: Diagnosis not present

## 2021-05-30 DIAGNOSIS — Z9221 Personal history of antineoplastic chemotherapy: Secondary | ICD-10-CM | POA: Diagnosis not present

## 2021-06-16 ENCOUNTER — Telehealth: Payer: Self-pay

## 2021-06-16 ENCOUNTER — Ambulatory Visit: Payer: Medicare Other | Admitting: Physician Assistant

## 2021-06-16 NOTE — Telephone Encounter (Signed)
Left a detailed message for patient Jason Noble informing him that his appointment for today 06/16/21 at 11:15 AM will be cancelled and that someone will be giving him a call back to reschedule his appointment and/or if he gets this message he can give our office a call to reschedule.

## 2021-06-16 NOTE — Telephone Encounter (Signed)
Patient's wife Jason Noble returned my call who is on DPR on file with patient Jason Noble in the background. We were able to get patient rescheduled for Friday 06/23/21 at 11:15 AM with Caron Presume, PA-C. They both thanked me for my efforts in the reschedule of appointment.

## 2021-06-20 DIAGNOSIS — Z96642 Presence of left artificial hip joint: Secondary | ICD-10-CM | POA: Diagnosis not present

## 2021-06-20 DIAGNOSIS — Z471 Aftercare following joint replacement surgery: Secondary | ICD-10-CM | POA: Diagnosis not present

## 2021-06-20 DIAGNOSIS — M1611 Unilateral primary osteoarthritis, right hip: Secondary | ICD-10-CM | POA: Diagnosis not present

## 2021-06-22 NOTE — Progress Notes (Signed)
Cardiology Office Note:    Date:  06/23/2021   ID:  Jason Noble, DOB 07/22/1941, MRN [id number]  PCP:  Shirline Frees, [place] Cardiologist: Kirk Ruths, MD   Reason for visit: Overdue 61-month follow-up  History of Present Illness:    Jason Noble is a 80 y.o. male with a hx of FU CAD; s/p CABG in 1991 and SVG BMS in 2008. Echocardiogram July 2021 showed normal LV function, mild right ventricular enlargement, mild right atrial enlargement, mild mitral regurgitation, ascending aorta measuring 4.5 cm.  CTA July 2021 showed pulmonary embolus.  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  Also with laryngeal cancer.   She had a video visit with Dr. Stanford Breed in January 2022 and was doing well.  Today he is doing well.  He denies chest pain, shortness of breath, PND, orthopnea and leg swelling.  He does mention feeling little bit more off balance when he is putting his pants on.  Denies any wooziness.  No bleeding issues on a baby aspirin.  He had issues with constipation that have resolved.  He has some issues with nocturnal leg cramping which is improved with walking and taking a magnesium supplement.  They asked for a dental note to state that he does not require antibiotics before any dental procedures.  He shows me his blood pressure log blood pressures have been in the 100s/50s lately.  He had tried to cut his amlodipine in half to 5 mg though noticed systolic blood pressures reaching the 140s to 150s with this and therefore resumed full dose 10 mg.  He denies lightheadedness or syncope.     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    Basal cell carcinoma of skin    Broken jaw (HCC)    right lower    Cholecystitis    Coronary heart disease    has a stent   CVA (cerebral infarction) 2007   "mild"   Fatigue 07/26/2011   History of  cancer chemotherapy    History of radiation therapy 06/18/2011- 08/03/2011   squamous cell tongue/69.96 Gy 51fx   Hypercholesteremia    under control   Hypertension    Myocardial infarction ([place])    approx 2008 or 2009   Numbness    right lower    Numbness and tingling in left hand    numbess 2 fingers    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   Peripheral vascular disease (HCC)    PFO (patent foramen ovale)    Prostate cancer ([place]) 2002   Skin cancer    basal cell, squamous cell   Squamous cell carcinoma of skin    Stroke ([place])    appro in 08 or 09    Past Surgical History:  Procedure Laterality Date   CATARACT EXTRACTION W/ INTRAOCULAR LENS  IMPLANT, BILATERAL     CHOLECYSTECTOMY  04/2009   3 surgeries involved: lap chole x2 laparotomy   CORONARY ARTERY BYPASS GRAFT  1991   triple   DIRECT LARYNGOSCOPY N/A 02/17/2020   Procedure: DIRECT LARYNGOSCOPY WITH BIOPSY;  Surgeon: Izora Gala, MD;  Location: [facility];  Service: ENT;  Laterality: N/A;   ELBOW SURGERY  42 years ago   removal left radial head due to hockey accident   EXPLORATORY LAPAROTOMY  04/2010   Dr. Redmond Pulling (3rd surgery  for gall bladder)   INGUINAL HERNIA REPAIR Right 09/23/2014   Procedure: RIGHT INGUINAL HERNIA REPAIR WITH MESH;  Surgeon: Jackolyn Confer, MD;  [facility];  Service: General;  Laterality: Right;   INGUINAL HERNIA REPAIR Left 09/30/2015   Procedure: HERNIA REPAIR LEFT INGUINAL ADULT;  Surgeon: Jackolyn Confer, MD;  [facility];  Service: General;  Laterality: Left;   INSERTION OF MESH Left 09/30/2015   Procedure: INSERTION OF MESH;  Surgeon: Jackolyn Confer, MD;  [facility];  Service: General;  Laterality: Left;   MULTIPLE TOOTH EXTRACTIONS     prior to radiation   PEG TUBE PLACEMENT  06/13/11 at [facility] IR   10/2011 removed   PROSTATECTOMY  2002   TONSILLECTOMY     x2: as child then 1985 for Left lingual tonsil removed   TOTAL HIP ARTHROPLASTY Left 02/08/2016   Procedure: TOTAL HIP  ARTHROPLASTY ANTERIOR APPROACH;  Surgeon: Frederik Pear, MD;  Location: [facility];  Service: Orthopedics;  Laterality: Left;   ULNAR NERVE TRANSPOSITION Left 10/16/2018   Procedure: ULNAR NERVE DECOMPRESSION/TRANSPOSITION;  Surgeon: [name], MD;  Location: [facility];  Service: Orthopedics;  Laterality: Left;    Current Medications: Current Meds  Medication Sig   amLODipine (NORVASC) 10 MG tablet TAKE ONE TABLET BY MOUTH ONE TIME DAILY   atorvastatin (LIPITOR) 80 MG tablet TAKE ONE TABLET BY MOUTH ONE TIME DAILY AT 6 PM   Coenzyme Q10 (CO Q 10 PO) Take 200 mg by mouth daily.   hydrochlorothiazide (MICROZIDE) 12.5 MG capsule Take 1 capsule (12.5 mg total) by mouth daily.   losartan (COZAAR) 100 MG tablet Take 1 tablet (100 mg total) by mouth daily.   OVER THE COUNTER MEDICATION Take 20 mg by mouth daily. Lutigold   OVER THE COUNTER MEDICATION Take 2 capsules by mouth daily. Nautrol  Daily vegetable x2 Daily fruit x2   tretinoin (RETIN-A) 0.025 % cream Apply 1 application topically daily as needed (apply to face rash).      Allergies:   Patient has no known allergies.   Social History   Socioeconomic History   Marital status: Married    Spouse name: Not on file   Number of children: Not on file   Years of education: Not on file   Highest education level: Not on file  Occupational History   Not on file  Tobacco Use   Smoking status: Former    Packs/day: 0.25    Years: 5.00    Pack years: 1.25    Types: Cigarettes    Quit date: 03/20/1966    Years since quitting: 55.2   Smokeless tobacco: Never  Vaping Use   Vaping Use: Never used  Substance and Sexual Activity   Alcohol use: Yes    Alcohol/week: 1.0 standard drink    Types: 1 Glasses of wine per week   Drug use: No   Sexual activity: Not on file  Other Topics Concern   Not on file  Social History Narrative   Not on file   Social Determinants of Health   Financial Resource Strain: Not on file  Food Insecurity: Not on  file  Transportation Needs: Not on file  Physical Activity: Not on file  Stress: Not on file  Social Connections: Not on file     Family History: The patient's family history includes Coronary artery disease in his brother; Stroke in his mother.  ROS:   Please see the history of present illness.     EKGs/Labs/Other Studies  Reviewed:    EKG:  The ekg ordered today demonstrates sinus bradycardia, heart rate 55, PR interval 162 ms, QRS duration 82 ms  Recent Labs: 12/29/2020: Creatinine, Ser 1.40   Recent Lipid Panel Lab Results  Component Value Date/Time   CHOL 168 10/30/2019 09:14 AM   TRIG 70 10/30/2019 09:14 AM   HDL 73 10/30/2019 09:14 AM   LDLCALC 82 10/30/2019 09:14 AM   LDLDIRECT 168.1 12/15/2012 03:39 PM    Physical Exam:    VS:  BP 118/70 (BP Location: Left Arm, Patient Position: Sitting, Cuff Size: Normal)   Pulse (!) 55   Ht 5\' 9"  (1.753 m)   Wt 177 lb (80.3 kg)   BMI 26.14 kg/m    No data found.  Wt Readings from Last 3 Encounters:  06/23/21 177 lb (80.3 kg)  10/04/20 170 lb (77.1 kg)  03/08/20 183 lb 2 oz (83.1 kg)     GEN:  Well nourished, well developed in no acute distress HEENT: Normal NECK: No JVD; No carotid bruits CARDIAC: RRR, no murmurs, rubs, gallops RESPIRATORY:  Clear to auscultation without rales, wheezing or rhonchi  ABDOMEN: Soft, non-tender, non-distended MUSCULOSKELETAL: No edema; No deformity  SKIN: Warm and dry NEUROLOGIC:  Alert and oriented PSYCHIATRIC:  Normal affect     ASSESSMENT AND PLAN   Coronary artery disease -No angina -Continue high intensity statin.  Not on beta-blocker secondary to resting bradycardia.  Dilated aortic root/Thoracic aortic aneurysm - CTA 12/2020: Stable aneurysmal disease of the aortic root and ascending thoracic aorta. Maximal diameter of the ascending thoracic aorta is approximately 4.3 cm.  -Recommend CTA in 1 year (18 months from last 2/2 stability).  No symptoms of chest or back  pain. -Continue blood pressure control with goal systolic blood pressure [id number]. -Continue statin and aspirin therapy.  Hypertension -Well-controlled.  Continue medications.  Check CMET with last addition of hydrochlorothiazide. -Goal blood pressure 105-120 given TAA.  Hyperlipidemia -LDL 82 in February 2021.  Continue statin.  Check lipids and liver fxn.    Prior CVA in 2009 -patient does have a history of patent foramen ovale but has had no recurrences.  Continue aspirin.  Pulmonary embolus following epiglottis surgery -patient has been treated with apixaban.  Now back on aspirin.  Disposition - Follow-up in 1 year.  Can schedule CTA at that time to follow thoracic aortic aneurysm.    Medication Adjustments/Labs and Tests Ordered: Current medicines are reviewed at length with the patient today.  Concerns regarding medicines are outlined above.  Orders Placed This Encounter  Procedures   Lipid panel   Comprehensive metabolic panel   EKG 38-SNKN   No orders of the defined types were placed in this encounter.   Patient Instructions  Medication Instructions:  Your physician recommends that you continue on your current medications as directed. Please refer to the Current Medication list given to you today.  *If you need a refill on your cardiac medications before your next appointment, please call your pharmacy*  Lab Work: Your physician recommends that you return for lab work ON 06/26/21:  CMET Fasting Lipid Panel-DO NOT EAT OR DRINK PAST MIDNIGHT. OKAY TO HAVE WATER. If you have labs (blood work) drawn today and your tests are completely normal, you will receive your results only by: [place] (if you have MyChart) OR A paper copy in the mail If you have any lab test that is abnormal or we need to change your treatment, we will call you to review the  results.  Testing/Procedures: NONE ordered at this time of appointment   Follow-Up: At [facility], you and  your health needs are our priority.  As part of our continuing mission to provide you with exceptional heart care, we have created designated Provider Care Teams.  These Care Teams include your primary Cardiologist (physician) and Advanced Practice Providers (APPs -  Physician Assistants and Nurse Practitioners) who all work together to provide you with the care you need, when you need it.   Your next appointment:   1 year(s)  The format for your next appointment:   In Person  Provider:   Kirk Ruths, MD  Other Instructions    Signed, Warren Lacy, PA-C  06/23/2021 12:22 PM    [facility]

## 2021-06-23 ENCOUNTER — Encounter: Payer: Self-pay | Admitting: Physician Assistant

## 2021-06-23 ENCOUNTER — Ambulatory Visit (INDEPENDENT_AMBULATORY_CARE_PROVIDER_SITE_OTHER): Payer: Medicare Other | Admitting: Physician Assistant

## 2021-06-23 ENCOUNTER — Other Ambulatory Visit: Payer: Self-pay

## 2021-06-23 VITALS — BP 118/70 | HR 55 | Ht 69.0 in | Wt 177.0 lb

## 2021-06-23 DIAGNOSIS — I2510 Atherosclerotic heart disease of native coronary artery without angina pectoris: Secondary | ICD-10-CM | POA: Diagnosis not present

## 2021-06-23 DIAGNOSIS — E785 Hyperlipidemia, unspecified: Secondary | ICD-10-CM | POA: Diagnosis not present

## 2021-06-23 DIAGNOSIS — I77810 Thoracic aortic ectasia: Secondary | ICD-10-CM | POA: Diagnosis not present

## 2021-06-23 DIAGNOSIS — Z951 Presence of aortocoronary bypass graft: Secondary | ICD-10-CM

## 2021-06-23 DIAGNOSIS — I712 Thoracic aortic aneurysm, without rupture, unspecified: Secondary | ICD-10-CM | POA: Diagnosis not present

## 2021-06-23 DIAGNOSIS — I10 Essential (primary) hypertension: Secondary | ICD-10-CM

## 2021-06-23 DIAGNOSIS — I7121 Aneurysm of the ascending aorta, without rupture: Secondary | ICD-10-CM | POA: Diagnosis not present

## 2021-06-23 NOTE — Patient Instructions (Addendum)
Medication Instructions:  Your physician recommends that you continue on your current medications as directed. Please refer to the Current Medication list given to you today.  *If you need a refill on your cardiac medications before your next appointment, please call your pharmacy*  Lab Work: Your physician recommends that you return for lab work ON 06/26/21:  CMET Fasting Lipid Panel-DO NOT EAT OR DRINK PAST MIDNIGHT. OKAY TO HAVE WATER. If you have labs (blood work) drawn today and your tests are completely normal, you will receive your results only by: [place] (if you have MyChart) OR A paper copy in the mail If you have any lab test that is abnormal or we need to change your treatment, we will call you to review the results.  Testing/Procedures: NONE ordered at this time of appointment   Follow-Up: At [facility], you and your health needs are our priority.  As part of our continuing mission to provide you with exceptional heart care, we have created designated Provider Care Teams.  These Care Teams include your primary Cardiologist (physician) and Advanced Practice Providers (APPs -  Physician Assistants and Nurse Practitioners) who all work together to provide you with the care you need, when you need it.   Your next appointment:   1 year(s)  The format for your next appointment:   In Person  Provider:   Kirk Ruths, MD  Other Instructions

## 2021-06-26 DIAGNOSIS — I10 Essential (primary) hypertension: Secondary | ICD-10-CM | POA: Diagnosis not present

## 2021-06-26 DIAGNOSIS — E785 Hyperlipidemia, unspecified: Secondary | ICD-10-CM | POA: Diagnosis not present

## 2021-06-26 LAB — LIPID PANEL
Chol/HDL Ratio: 2.3 ratio (ref 0.0–5.0)
Cholesterol, Total: 173 mg/dL (ref 100–199)
HDL: 74 mg/dL (ref >39)
LDL Chol Calc (NIH): 83 mg/dL (ref 0–99)
Triglycerides: 85 mg/dL (ref 0–149)
VLDL Cholesterol Cal: 16 mg/dL (ref 5–40)

## 2021-06-26 LAB — COMPREHENSIVE METABOLIC PANEL
ALT: 25 IU/L (ref 0–44)
AST: 33 IU/L (ref 0–40)
Albumin/Globulin Ratio: 2 (ref 1.2–2.2)
Albumin: 4.1 g/dL (ref 3.7–4.7)
Alkaline Phosphatase: 91 IU/L (ref 44–121)
BUN/Creatinine Ratio: 24 (ref 10–24)
BUN: 31 mg/dL — ABNORMAL HIGH (ref 8–27)
Bilirubin Total: 0.5 mg/dL (ref 0.0–1.2)
CO2: 23 mmol/L (ref 20–29)
Calcium: 9.7 mg/dL (ref 8.6–10.2)
Chloride: 103 mmol/L (ref 96–106)
Creatinine, Ser: 1.31 mg/dL — ABNORMAL HIGH (ref 0.76–1.27)
Globulin, Total: 2.1 g/dL (ref 1.5–4.5)
Glucose: 97 mg/dL (ref 70–99)
Potassium: 4.7 mmol/L (ref 3.5–5.2)
Sodium: 139 mmol/L (ref 134–144)
Total Protein: 6.2 g/dL (ref 6.0–8.5)
eGFR: 55 mL/min/{1.73_m2} — ABNORMAL LOW (ref >59)

## 2021-06-28 ENCOUNTER — Telehealth: Payer: Self-pay

## 2021-06-28 NOTE — Telephone Encounter (Addendum)
Called patient regarding blood test results----- Message from Warren Lacy, PA-C sent at 06/27/2021  3:12 PM EDT ----- Your kidney function is improved from last check.  Your potassium is normal.  These are good results reflecting that you are tolerating the hydrochlorothiazide well.  Your liver functions normal.  Your good cholesterol is high which is cardioprotective.  Continue with the good job you are doing!

## 2021-07-12 DIAGNOSIS — N183 Chronic kidney disease, stage 3 unspecified: Secondary | ICD-10-CM | POA: Diagnosis not present

## 2021-07-12 DIAGNOSIS — E7800 Pure hypercholesterolemia, unspecified: Secondary | ICD-10-CM | POA: Diagnosis not present

## 2021-07-12 DIAGNOSIS — Z85810 Personal history of malignant neoplasm of tongue: Secondary | ICD-10-CM | POA: Diagnosis not present

## 2021-07-12 DIAGNOSIS — Z8546 Personal history of malignant neoplasm of prostate: Secondary | ICD-10-CM | POA: Diagnosis not present

## 2021-07-12 DIAGNOSIS — Z0000 Encounter for general adult medical examination without abnormal findings: Secondary | ICD-10-CM | POA: Diagnosis not present

## 2021-07-12 DIAGNOSIS — I10 Essential (primary) hypertension: Secondary | ICD-10-CM | POA: Diagnosis not present

## 2021-07-12 DIAGNOSIS — R739 Hyperglycemia, unspecified: Secondary | ICD-10-CM | POA: Diagnosis not present

## 2021-07-24 ENCOUNTER — Other Ambulatory Visit: Payer: Self-pay | Admitting: Cardiology

## 2021-07-25 DIAGNOSIS — K5900 Constipation, unspecified: Secondary | ICD-10-CM | POA: Diagnosis not present

## 2021-07-26 DIAGNOSIS — Z23 Encounter for immunization: Secondary | ICD-10-CM | POA: Diagnosis not present

## 2021-07-26 DIAGNOSIS — D0462 Carcinoma in situ of skin of left upper limb, including shoulder: Secondary | ICD-10-CM | POA: Diagnosis not present

## 2021-07-31 DIAGNOSIS — Z23 Encounter for immunization: Secondary | ICD-10-CM | POA: Diagnosis not present

## 2021-08-22 DIAGNOSIS — D485 Neoplasm of uncertain behavior of skin: Secondary | ICD-10-CM | POA: Diagnosis not present

## 2021-08-22 DIAGNOSIS — Z85828 Personal history of other malignant neoplasm of skin: Secondary | ICD-10-CM | POA: Diagnosis not present

## 2021-08-22 DIAGNOSIS — Z85820 Personal history of malignant melanoma of skin: Secondary | ICD-10-CM | POA: Diagnosis not present

## 2021-08-22 DIAGNOSIS — D0471 Carcinoma in situ of skin of right lower limb, including hip: Secondary | ICD-10-CM | POA: Diagnosis not present

## 2021-08-22 DIAGNOSIS — L570 Actinic keratosis: Secondary | ICD-10-CM | POA: Diagnosis not present

## 2021-08-22 DIAGNOSIS — L821 Other seborrheic keratosis: Secondary | ICD-10-CM | POA: Diagnosis not present

## 2021-08-22 DIAGNOSIS — D044 Carcinoma in situ of skin of scalp and neck: Secondary | ICD-10-CM | POA: Diagnosis not present

## 2021-08-22 DIAGNOSIS — L578 Other skin changes due to chronic exposure to nonionizing radiation: Secondary | ICD-10-CM | POA: Diagnosis not present

## 2021-09-05 ENCOUNTER — Other Ambulatory Visit: Payer: Self-pay | Admitting: Cardiology

## 2021-10-03 DIAGNOSIS — Z8521 Personal history of malignant neoplasm of larynx: Secondary | ICD-10-CM | POA: Diagnosis not present

## 2021-10-03 DIAGNOSIS — Z85810 Personal history of malignant neoplasm of tongue: Secondary | ICD-10-CM | POA: Diagnosis not present

## 2021-10-03 DIAGNOSIS — Z9221 Personal history of antineoplastic chemotherapy: Secondary | ICD-10-CM | POA: Diagnosis not present

## 2021-10-03 DIAGNOSIS — Z08 Encounter for follow-up examination after completed treatment for malignant neoplasm: Secondary | ICD-10-CM | POA: Diagnosis not present

## 2021-10-03 DIAGNOSIS — L598 Other specified disorders of the skin and subcutaneous tissue related to radiation: Secondary | ICD-10-CM | POA: Diagnosis not present

## 2021-10-03 DIAGNOSIS — Z923 Personal history of irradiation: Secondary | ICD-10-CM | POA: Diagnosis not present

## 2021-10-24 DIAGNOSIS — R0981 Nasal congestion: Secondary | ICD-10-CM | POA: Diagnosis not present

## 2021-10-24 DIAGNOSIS — U071 COVID-19: Secondary | ICD-10-CM | POA: Diagnosis not present

## 2021-10-24 DIAGNOSIS — R059 Cough, unspecified: Secondary | ICD-10-CM | POA: Diagnosis not present

## 2021-11-06 ENCOUNTER — Other Ambulatory Visit: Payer: Self-pay | Admitting: Cardiology

## 2021-11-21 DIAGNOSIS — C4442 Squamous cell carcinoma of skin of scalp and neck: Secondary | ICD-10-CM | POA: Diagnosis not present

## 2021-12-06 ENCOUNTER — Encounter: Payer: Self-pay | Admitting: *Deleted

## 2021-12-06 DIAGNOSIS — I712 Thoracic aortic aneurysm, without rupture, unspecified: Secondary | ICD-10-CM

## 2021-12-14 ENCOUNTER — Ambulatory Visit
Admission: RE | Admit: 2021-12-14 | Discharge: 2021-12-14 | Disposition: A | Discharge status: Home / Self Care | Payer: Medicare Other | Source: Ambulatory Visit | Attending: Physician Assistant | Admitting: Physician Assistant

## 2021-12-14 ENCOUNTER — Other Ambulatory Visit: Payer: Self-pay | Admitting: Physician Assistant

## 2021-12-14 DIAGNOSIS — W19XXXA Unspecified fall, initial encounter: Secondary | ICD-10-CM | POA: Diagnosis not present

## 2021-12-14 DIAGNOSIS — R52 Pain, unspecified: Secondary | ICD-10-CM

## 2021-12-14 DIAGNOSIS — Y92009 Unspecified place in unspecified non-institutional (private) residence as the place of occurrence of the external cause: Secondary | ICD-10-CM | POA: Diagnosis not present

## 2021-12-14 DIAGNOSIS — S40022A Contusion of left upper arm, initial encounter: Secondary | ICD-10-CM | POA: Diagnosis not present

## 2021-12-14 DIAGNOSIS — R0781 Pleurodynia: Secondary | ICD-10-CM | POA: Diagnosis not present

## 2021-12-21 DIAGNOSIS — L821 Other seborrheic keratosis: Secondary | ICD-10-CM | POA: Diagnosis not present

## 2021-12-21 DIAGNOSIS — L570 Actinic keratosis: Secondary | ICD-10-CM | POA: Diagnosis not present

## 2021-12-21 DIAGNOSIS — D485 Neoplasm of uncertain behavior of skin: Secondary | ICD-10-CM | POA: Diagnosis not present

## 2021-12-21 DIAGNOSIS — L820 Inflamed seborrheic keratosis: Secondary | ICD-10-CM | POA: Diagnosis not present

## 2021-12-21 DIAGNOSIS — Z85820 Personal history of malignant melanoma of skin: Secondary | ICD-10-CM | POA: Diagnosis not present

## 2021-12-21 DIAGNOSIS — D0461 Carcinoma in situ of skin of right upper limb, including shoulder: Secondary | ICD-10-CM | POA: Diagnosis not present

## 2021-12-21 DIAGNOSIS — Z85828 Personal history of other malignant neoplasm of skin: Secondary | ICD-10-CM | POA: Diagnosis not present

## 2021-12-21 DIAGNOSIS — L578 Other skin changes due to chronic exposure to nonionizing radiation: Secondary | ICD-10-CM | POA: Diagnosis not present

## 2022-01-03 ENCOUNTER — Ambulatory Visit
Admission: RE | Admit: 2022-01-03 | Discharge: 2022-01-03 | Disposition: A | Discharge status: Home / Self Care | Payer: Medicare Other | Source: Ambulatory Visit | Attending: Cardiology | Admitting: Cardiology

## 2022-01-03 DIAGNOSIS — I7121 Aneurysm of the ascending aorta, without rupture: Secondary | ICD-10-CM | POA: Diagnosis not present

## 2022-01-03 DIAGNOSIS — I712 Thoracic aortic aneurysm, without rupture, unspecified: Secondary | ICD-10-CM

## 2022-01-03 MED ORDER — IOPAMIDOL (ISOVUE-370) INJECTION 76%
75.0000 mL | Freq: Once | INTRAVENOUS | Status: AC | PRN
  Administered 2022-01-03: 50 mL via INTRAVENOUS

## 2022-01-29 DIAGNOSIS — C321 Malignant neoplasm of supraglottis: Secondary | ICD-10-CM | POA: Diagnosis not present

## 2022-01-30 DIAGNOSIS — Z85810 Personal history of malignant neoplasm of tongue: Secondary | ICD-10-CM | POA: Diagnosis not present

## 2022-01-30 DIAGNOSIS — K118 Other diseases of salivary glands: Secondary | ICD-10-CM | POA: Diagnosis not present

## 2022-01-30 DIAGNOSIS — C321 Malignant neoplasm of supraglottis: Secondary | ICD-10-CM | POA: Diagnosis not present

## 2022-01-30 DIAGNOSIS — R1312 Dysphagia, oropharyngeal phase: Secondary | ICD-10-CM | POA: Diagnosis not present

## 2022-01-30 DIAGNOSIS — Z9889 Other specified postprocedural states: Secondary | ICD-10-CM | POA: Diagnosis not present

## 2022-01-30 DIAGNOSIS — Z923 Personal history of irradiation: Secondary | ICD-10-CM | POA: Diagnosis not present

## 2022-02-01 ENCOUNTER — Other Ambulatory Visit: Payer: Self-pay | Admitting: Cardiology

## 2022-02-20 DIAGNOSIS — M7062 Trochanteric bursitis, left hip: Secondary | ICD-10-CM | POA: Diagnosis not present

## 2022-02-20 DIAGNOSIS — M7061 Trochanteric bursitis, right hip: Secondary | ICD-10-CM | POA: Diagnosis not present

## 2022-02-26 DIAGNOSIS — L988 Other specified disorders of the skin and subcutaneous tissue: Secondary | ICD-10-CM | POA: Diagnosis not present

## 2022-02-26 DIAGNOSIS — D0461 Carcinoma in situ of skin of right upper limb, including shoulder: Secondary | ICD-10-CM | POA: Diagnosis not present

## 2022-03-02 DIAGNOSIS — C321 Malignant neoplasm of supraglottis: Secondary | ICD-10-CM | POA: Diagnosis not present

## 2022-03-02 DIAGNOSIS — K118 Other diseases of salivary glands: Secondary | ICD-10-CM | POA: Diagnosis not present

## 2022-03-03 ENCOUNTER — Other Ambulatory Visit: Payer: Self-pay | Admitting: Cardiology

## 2022-03-13 DIAGNOSIS — C44529 Squamous cell carcinoma of skin of other part of trunk: Secondary | ICD-10-CM | POA: Diagnosis not present

## 2022-03-13 DIAGNOSIS — Z411 Encounter for cosmetic surgery: Secondary | ICD-10-CM | POA: Diagnosis not present

## 2022-03-13 DIAGNOSIS — L570 Actinic keratosis: Secondary | ICD-10-CM | POA: Diagnosis not present

## 2022-03-13 DIAGNOSIS — Z4802 Encounter for removal of sutures: Secondary | ICD-10-CM | POA: Diagnosis not present

## 2022-03-13 DIAGNOSIS — D0462 Carcinoma in situ of skin of left upper limb, including shoulder: Secondary | ICD-10-CM | POA: Diagnosis not present

## 2022-03-13 DIAGNOSIS — D485 Neoplasm of uncertain behavior of skin: Secondary | ICD-10-CM | POA: Diagnosis not present

## 2022-03-22 DIAGNOSIS — T148XXA Other injury of unspecified body region, initial encounter: Secondary | ICD-10-CM | POA: Diagnosis not present

## 2022-03-28 NOTE — Progress Notes (Signed)
HPI: FU CAD; s/p CABG in 1991 and SVG BMS in 2008. Echocardiogram July 2021 showed normal LV function, mild right ventricular enlargement, mild right atrial enlargement, mild mitral regurgitation, ascending aorta measuring 4.5 cm.  Also with laryngeal cancer.  CTA April 2023 showed 4.3 cm thoracic aortic aneurysm and coronary calcification; 6 mm left lower lobe nodule and follow-up chest CT recommended 6 to 12 months.  Since last seen,  Current Outpatient Medications  Medication Sig Dispense Refill   amLODipine (NORVASC) 10 MG tablet TAKE ONE TABLET BY MOUTH ONE TIME DAILY 90 tablet 0   atorvastatin (LIPITOR) 80 MG tablet TAKE ONE TABLET BY MOUTH ONE TIME DAILY AT 6 PM 90 tablet 3   Coenzyme Q10 (CO Q 10 PO) Take 200 mg by mouth daily.     hydrochlorothiazide (MICROZIDE) 12.5 MG capsule TAKE ONE CAPSULE BY MOUTH ONE TIME DAILY 90 capsule 0   LECITHIN CONCENTRATE PO Take by mouth.     losartan (COZAAR) 100 MG tablet TAKE ONE TABLET BY MOUTH ONE TIME DAILY 90 tablet 0   Multiple Vitamin (MULTIVITAMIN) capsule Take 1 capsule by mouth daily.     Omega-3 Fatty Acids (OMEGA 3 PO) Take by mouth.     OVER THE COUNTER MEDICATION Take 20 mg by mouth daily. Lutigold     OVER THE COUNTER MEDICATION Take 2 capsules by mouth daily.     vitamin E 180 MG (400 UNITS) capsule Take 400 Units by mouth daily.     No current facility-administered medications for this visit.   Facility-Administered Medications Ordered in Other Visits  Medication Dose Route Frequency Provider Last Rate Last Admin   topical emolient (BIAFINE) emulsion   Topical BID Kyung Rudd, MD   Given at 07/30/11 1405     Past Medical History:  Diagnosis Date   Abscess of gallbladder    Aneurysm of thoracic aorta (HCC)    4.5 cm    Basal cell carcinoma of skin    Broken jaw ([place])    right lower    Cholecystitis    Coronary heart disease    has a stent   CVA (cerebral infarction) 2007   "mild"   Fatigue 07/26/2011   History  of cancer chemotherapy    History of radiation therapy 06/18/2011- 08/03/2011   squamous cell tongue/69.96 Gy 8f   Hypercholesteremia    under control   Hypertension    Myocardial infarction ([place]    approx 2008 or 2009   Numbness    right lower    Numbness and tingling in left hand    numbess 2 fingers    [place]arynx cancer ([place] 2012   HPV positive   Peripheral vascular disease (HCC)    PFO (patent foramen ovale)    Prostate cancer ([place] 2002   Skin cancer    basal cell, squamous cell   Squamous cell carcinoma of skin    Stroke ([place]    appro in 08 or 09    Past Surgical History:  Procedure Laterality Date   CATARACT EXTRACTION W/ INTRAOCULAR LENS  IMPLANT, BILATERAL     CHOLECYSTECTOMY  04/2009   3 surgeries involved: lap chole x2 laparotomy   CORONARY ARTERY BYPASS GRAFT  1991   triple   DIRECT LARYNGOSCOPY N/A 02/17/2020   Procedure: DIRECT LARYNGOSCOPY WITH BIOPSY;  Surgeon: RIzora Gala MD;  Location[facility]  Service: ENT;  Laterality: N/A;   ELBOW SURGERY  42 years ago   removal left  radial head due to hockey accident   EXPLORATORY LAPAROTOMY  04/2010   Dr. Redmond Pulling (3rd surgery for gall bladder)   INGUINAL HERNIA REPAIR Right 09/23/2014   Procedure: RIGHT INGUINAL HERNIA REPAIR WITH MESH;  Surgeon: Jackolyn Confer, MD;  [facility];  Service: General;  Laterality: Right;   INGUINAL HERNIA REPAIR Left 09/30/2015   Procedure: HERNIA REPAIR LEFT INGUINAL ADULT;  Surgeon: Jackolyn Confer, MD;  [facility];  Service: General;  Laterality: Left;   INSERTION OF MESH Left 09/30/2015   Procedure: INSERTION OF MESH;  Surgeon: Jackolyn Confer, MD;  [facility];  Service: General;  Laterality: Left;   MULTIPLE TOOTH EXTRACTIONS     prior to radiation   PEG TUBE PLACEMENT  06/13/11 at [facility]   10/2011 removed   PROSTATECTOMY  2002   TONSILLECTOMY     x2: as child then 1985 for Left lingual tonsil removed   TOTAL HIP ARTHROPLASTY Left 02/08/2016   Procedure: TOTAL HIP  ARTHROPLASTY ANTERIOR APPROACH;  Surgeon: Frederik Pear, MD;  Location: [facility];  Service: Orthopedics;  Laterality: Left;   ULNAR NERVE TRANSPOSITION Left 10/16/2018   Procedure: ULNAR NERVE DECOMPRESSION/TRANSPOSITION;  Surgeon: [name], MD;  Location: [facility];  Service: Orthopedics;  Laterality: Left;    Social History   Socioeconomic History   Marital status: Married    Spouse name: Not on file   Number of children: Not on file   Years of education: Not on file   Highest education level: Not on file  Occupational History   Not on file  Tobacco Use   Smoking status: Former    Packs/day: 0.25    Years: 5.00    Total pack years: 1.25    Types: Cigarettes    Quit date: 03/20/1966    Years since quitting: 56.0   Smokeless tobacco: Never  Vaping Use   Vaping Use: Never used  Substance and Sexual Activity   Alcohol use: Yes    Alcohol/week: 1.0 standard drink of alcohol    Types: 1 Glasses of wine per week   Drug use: No   Sexual activity: Not on file  Other Topics Concern   Not on file  Social History Narrative   Not on file   Social Determinants of Health   Financial Resource Strain: Not on file  Food Insecurity: Not on file  Transportation Needs: Not on file  Physical Activity: Not on file  Stress: Not on file  Social Connections: Not on file  Intimate Partner Violence: Not on file    Family History  Problem Relation Age of Onset   Stroke Mother    Coronary artery disease Brother        had bypass    ROS: no fevers or chills, productive cough, hemoptysis, dysphasia, odynophagia, melena, hematochezia, dysuria, hematuria, rash, seizure activity, orthopnea, PND, pedal edema, claudication. Remaining systems are negative.  Physical Exam: Well-developed well-nourished in no acute distress.  Skin is warm and dry.  HEENT is normal.  Neck is supple.  Chest is clear to auscultation with normal expansion.  Cardiovascular exam is regular rate and rhythm.  Abdominal  exam nontender or distended. No masses palpated. Extremities show no edema. neuro grossly intact  ECG-sinus bradycardia with occasional PVC, no ST changes.  Personally reviewed  A/P  1 coronary artery disease-patient denies chest pain.  Continue aspirin and statin.  2 thoracic aortic aneurysm-plan follow-up CTA April 2024.  3 pulmonary nodule-plan follow-up noncontrast chest CT October 2023.  4 hypertension-blood pressure controlled.  He is only taking HCTZ 3 times weekly and his blood pressures at home have been reasonable.  We will discontinue and follow.  Check potassium and renal function.  5 hyperlipidemia-continue statin. Check lipids and liver.  6 prior CVA-continue aspirin.  He has had no recurrences.  Note he does have a history of PFO.  Kirk Ruths, MD

## 2022-04-10 ENCOUNTER — Ambulatory Visit (INDEPENDENT_AMBULATORY_CARE_PROVIDER_SITE_OTHER): Payer: Medicare Other | Admitting: Cardiology

## 2022-04-10 ENCOUNTER — Encounter: Payer: Self-pay | Admitting: Cardiology

## 2022-04-10 VITALS — BP 132/80 | HR 53 | Ht 70.0 in | Wt 169.4 lb

## 2022-04-10 DIAGNOSIS — I712 Thoracic aortic aneurysm, without rupture, unspecified: Secondary | ICD-10-CM

## 2022-04-10 DIAGNOSIS — I10 Essential (primary) hypertension: Secondary | ICD-10-CM

## 2022-04-10 DIAGNOSIS — E785 Hyperlipidemia, unspecified: Secondary | ICD-10-CM | POA: Diagnosis not present

## 2022-04-10 DIAGNOSIS — I2510 Atherosclerotic heart disease of native coronary artery without angina pectoris: Secondary | ICD-10-CM | POA: Diagnosis not present

## 2022-04-10 NOTE — Patient Instructions (Signed)
Medication Instructions:   STOP HCTZ  *If you need a refill on your cardiac medications before your next appointment, please call your pharmacy*   Lab Work:  Your physician recommends that you return for lab work FASTING  If you have labs (blood work) drawn today and your tests are completely normal, you will receive your results only by: [place] (if you have MyChart) OR A paper copy in the mail If you have any lab test that is abnormal or we need to change your treatment, we will call you to review the results.   Follow-Up: At [facility], you and your health needs are our priority.  As part of our continuing mission to provide you with exceptional heart care, we have created designated Provider Care Teams.  These Care Teams include your primary Cardiologist (physician) and Advanced Practice Providers (APPs -  Physician Assistants and Nurse Practitioners) who all work together to provide you with the care you need, when you need it.  We recommend signing up for the patient portal called "MyChart".  Sign up information is provided on this After Visit Summary.  MyChart is used to connect with patients for Virtual Visits (Telemedicine).  Patients are able to view lab/test results, encounter notes, upcoming appointments, etc.  Non-urgent messages can be sent to your provider as well.   To learn more about what you can do with MyChart, go to [email or web address].    Your next appointment:   12 month(s)  The format for your next appointment:   In Person  Provider:   Kirk Ruths, MD       Important Information About Sugar

## 2022-04-18 DIAGNOSIS — E785 Hyperlipidemia, unspecified: Secondary | ICD-10-CM | POA: Diagnosis not present

## 2022-04-18 DIAGNOSIS — I10 Essential (primary) hypertension: Secondary | ICD-10-CM | POA: Diagnosis not present

## 2022-04-18 LAB — COMPREHENSIVE METABOLIC PANEL
ALT: 21 IU/L (ref 0–44)
AST: 36 IU/L (ref 0–40)
Albumin/Globulin Ratio: 2 (ref 1.2–2.2)
Albumin: 4.3 g/dL (ref 3.7–4.7)
Alkaline Phosphatase: 63 IU/L (ref 44–121)
BUN/Creatinine Ratio: 30 — ABNORMAL HIGH (ref 10–24)
BUN: 33 mg/dL — ABNORMAL HIGH (ref 8–27)
Bilirubin Total: 0.8 mg/dL (ref 0.0–1.2)
CO2: 21 mmol/L (ref 20–29)
Calcium: 9.4 mg/dL (ref 8.6–10.2)
Chloride: 106 mmol/L (ref 96–106)
Creatinine, Ser: 1.11 mg/dL (ref 0.76–1.27)
Globulin, Total: 2.2 g/dL (ref 1.5–4.5)
Glucose: 85 mg/dL (ref 70–99)
Potassium: 4.4 mmol/L (ref 3.5–5.2)
Sodium: 141 mmol/L (ref 134–144)
Total Protein: 6.5 g/dL (ref 6.0–8.5)
eGFR: 67 mL/min/{1.73_m2} (ref >59)

## 2022-04-18 LAB — LIPID PANEL
Chol/HDL Ratio: 2.4 ratio (ref 0.0–5.0)
Cholesterol, Total: 163 mg/dL (ref 100–199)
HDL: 67 mg/dL (ref >39)
LDL Chol Calc (NIH): 81 mg/dL (ref 0–99)
Triglycerides: 78 mg/dL (ref 0–149)
VLDL Cholesterol Cal: 15 mg/dL (ref 5–40)

## 2022-04-23 ENCOUNTER — Other Ambulatory Visit: Payer: Self-pay | Admitting: *Deleted

## 2022-04-23 DIAGNOSIS — E785 Hyperlipidemia, unspecified: Secondary | ICD-10-CM

## 2022-04-23 MED ORDER — EZETIMIBE 10 MG PO TABS: 10.0000 mg | ORAL_TABLET | Freq: Every day | ORAL | 3 refills | Status: DC

## 2022-04-26 DIAGNOSIS — C44529 Squamous cell carcinoma of skin of other part of trunk: Secondary | ICD-10-CM | POA: Diagnosis not present

## 2022-04-26 DIAGNOSIS — D485 Neoplasm of uncertain behavior of skin: Secondary | ICD-10-CM | POA: Diagnosis not present

## 2022-05-16 ENCOUNTER — Other Ambulatory Visit: Payer: Self-pay | Admitting: Adult Health

## 2022-05-18 MED ORDER — AMLODIPINE BESYLATE 10 MG PO TABS: 10.0000 mg | ORAL_TABLET | Freq: Every day | ORAL | 3 refills | Status: DC

## 2022-05-31 DIAGNOSIS — D485 Neoplasm of uncertain behavior of skin: Secondary | ICD-10-CM | POA: Diagnosis not present

## 2022-05-31 DIAGNOSIS — D0462 Carcinoma in situ of skin of left upper limb, including shoulder: Secondary | ICD-10-CM | POA: Diagnosis not present

## 2022-06-05 DIAGNOSIS — Z85810 Personal history of malignant neoplasm of tongue: Secondary | ICD-10-CM | POA: Diagnosis not present

## 2022-06-05 DIAGNOSIS — K116 Mucocele of salivary gland: Secondary | ICD-10-CM | POA: Diagnosis not present

## 2022-06-05 DIAGNOSIS — Z9221 Personal history of antineoplastic chemotherapy: Secondary | ICD-10-CM | POA: Diagnosis not present

## 2022-06-05 DIAGNOSIS — Z923 Personal history of irradiation: Secondary | ICD-10-CM | POA: Diagnosis not present

## 2022-06-05 DIAGNOSIS — Z85818 Personal history of malignant neoplasm of other sites of lip, oral cavity, and pharynx: Secondary | ICD-10-CM | POA: Diagnosis not present

## 2022-06-05 DIAGNOSIS — C321 Malignant neoplasm of supraglottis: Secondary | ICD-10-CM | POA: Diagnosis not present

## 2022-06-05 DIAGNOSIS — K118 Other diseases of salivary glands: Secondary | ICD-10-CM | POA: Diagnosis not present

## 2022-06-12 DIAGNOSIS — M1611 Unilateral primary osteoarthritis, right hip: Secondary | ICD-10-CM | POA: Diagnosis not present

## 2022-06-13 ENCOUNTER — Other Ambulatory Visit: Payer: Self-pay | Admitting: *Deleted

## 2022-06-13 DIAGNOSIS — R911 Solitary pulmonary nodule: Secondary | ICD-10-CM

## 2022-06-15 ENCOUNTER — Other Ambulatory Visit: Payer: Self-pay | Admitting: Cardiology

## 2022-06-18 DIAGNOSIS — Z23 Encounter for immunization: Secondary | ICD-10-CM | POA: Diagnosis not present

## 2022-06-20 ENCOUNTER — Other Ambulatory Visit: Payer: Self-pay | Admitting: Cardiology

## 2022-06-20 ENCOUNTER — Ambulatory Visit
Admission: RE | Admit: 2022-06-20 | Discharge: 2022-06-20 | Disposition: A | Discharge status: Home / Self Care | Payer: Medicare Other | Source: Ambulatory Visit | Attending: Cardiology | Admitting: Cardiology

## 2022-06-20 DIAGNOSIS — I700 Atherosclerosis of aorta: Secondary | ICD-10-CM | POA: Diagnosis not present

## 2022-06-20 DIAGNOSIS — R911 Solitary pulmonary nodule: Secondary | ICD-10-CM

## 2022-07-04 DIAGNOSIS — E785 Hyperlipidemia, unspecified: Secondary | ICD-10-CM | POA: Diagnosis not present

## 2022-07-04 LAB — HEPATIC FUNCTION PANEL
ALT: 28 IU/L (ref 0–44)
AST: 38 IU/L (ref 0–40)
Albumin: 4.1 g/dL (ref 3.7–4.7)
Alkaline Phosphatase: 61 IU/L (ref 44–121)
Bilirubin Total: 0.6 mg/dL (ref 0.0–1.2)
Bilirubin, Direct: 0.21 mg/dL (ref 0.00–0.40)
Total Protein: 6.1 g/dL (ref 6.0–8.5)

## 2022-07-04 LAB — LIPID PANEL
Chol/HDL Ratio: 2.2 ratio (ref 0.0–5.0)
Cholesterol, Total: 125 mg/dL (ref 100–199)
HDL: 56 mg/dL (ref >39)
LDL Chol Calc (NIH): 58 mg/dL (ref 0–99)
Triglycerides: 46 mg/dL (ref 0–149)
VLDL Cholesterol Cal: 11 mg/dL (ref 5–40)

## 2022-07-05 DIAGNOSIS — L821 Other seborrheic keratosis: Secondary | ICD-10-CM | POA: Diagnosis not present

## 2022-07-05 DIAGNOSIS — L570 Actinic keratosis: Secondary | ICD-10-CM | POA: Diagnosis not present

## 2022-07-05 DIAGNOSIS — L578 Other skin changes due to chronic exposure to nonionizing radiation: Secondary | ICD-10-CM | POA: Diagnosis not present

## 2022-07-05 DIAGNOSIS — D2262 Melanocytic nevi of left upper limb, including shoulder: Secondary | ICD-10-CM | POA: Diagnosis not present

## 2022-07-05 DIAGNOSIS — D225 Melanocytic nevi of trunk: Secondary | ICD-10-CM | POA: Diagnosis not present

## 2022-07-05 DIAGNOSIS — C44311 Basal cell carcinoma of skin of nose: Secondary | ICD-10-CM | POA: Diagnosis not present

## 2022-07-05 DIAGNOSIS — D485 Neoplasm of uncertain behavior of skin: Secondary | ICD-10-CM | POA: Diagnosis not present

## 2022-07-16 ENCOUNTER — Encounter: Payer: Self-pay | Admitting: *Deleted

## 2022-07-26 DIAGNOSIS — I10 Essential (primary) hypertension: Secondary | ICD-10-CM | POA: Diagnosis not present

## 2022-07-26 DIAGNOSIS — Z85810 Personal history of malignant neoplasm of tongue: Secondary | ICD-10-CM | POA: Diagnosis not present

## 2022-07-26 DIAGNOSIS — R7303 Prediabetes: Secondary | ICD-10-CM | POA: Diagnosis not present

## 2022-07-26 DIAGNOSIS — C61 Malignant neoplasm of prostate: Secondary | ICD-10-CM | POA: Diagnosis not present

## 2022-07-26 DIAGNOSIS — N183 Chronic kidney disease, stage 3 unspecified: Secondary | ICD-10-CM | POA: Diagnosis not present

## 2022-07-26 DIAGNOSIS — Z0000 Encounter for general adult medical examination without abnormal findings: Secondary | ICD-10-CM | POA: Diagnosis not present

## 2022-07-26 DIAGNOSIS — Z8546 Personal history of malignant neoplasm of prostate: Secondary | ICD-10-CM | POA: Diagnosis not present

## 2022-07-26 DIAGNOSIS — E7800 Pure hypercholesterolemia, unspecified: Secondary | ICD-10-CM | POA: Diagnosis not present

## 2022-07-26 DIAGNOSIS — K5900 Constipation, unspecified: Secondary | ICD-10-CM | POA: Diagnosis not present

## 2022-07-26 DIAGNOSIS — N5203 Combined arterial insufficiency and corporo-venous occlusive erectile dysfunction: Secondary | ICD-10-CM | POA: Diagnosis not present

## 2022-07-30 DIAGNOSIS — C44311 Basal cell carcinoma of skin of nose: Secondary | ICD-10-CM | POA: Diagnosis not present

## 2022-07-30 DIAGNOSIS — D485 Neoplasm of uncertain behavior of skin: Secondary | ICD-10-CM | POA: Diagnosis not present

## 2022-07-30 DIAGNOSIS — C44321 Squamous cell carcinoma of skin of nose: Secondary | ICD-10-CM | POA: Diagnosis not present

## 2022-08-14 DIAGNOSIS — R946 Abnormal results of thyroid function studies: Secondary | ICD-10-CM | POA: Diagnosis not present

## 2022-08-14 DIAGNOSIS — R7989 Other specified abnormal findings of blood chemistry: Secondary | ICD-10-CM | POA: Diagnosis not present

## 2022-08-20 DIAGNOSIS — Z8589 Personal history of malignant neoplasm of other organs and systems: Secondary | ICD-10-CM | POA: Diagnosis not present

## 2022-08-20 DIAGNOSIS — Z5189 Encounter for other specified aftercare: Secondary | ICD-10-CM | POA: Diagnosis not present

## 2022-09-08 ENCOUNTER — Other Ambulatory Visit: Payer: Self-pay | Admitting: Cardiology

## 2022-10-01 DIAGNOSIS — C44321 Squamous cell carcinoma of skin of nose: Secondary | ICD-10-CM | POA: Diagnosis not present

## 2022-10-09 DIAGNOSIS — L578 Other skin changes due to chronic exposure to nonionizing radiation: Secondary | ICD-10-CM | POA: Diagnosis not present

## 2022-10-09 DIAGNOSIS — D0471 Carcinoma in situ of skin of right lower limb, including hip: Secondary | ICD-10-CM | POA: Diagnosis not present

## 2022-10-09 DIAGNOSIS — C44222 Squamous cell carcinoma of skin of right ear and external auricular canal: Secondary | ICD-10-CM | POA: Diagnosis not present

## 2022-10-09 DIAGNOSIS — L570 Actinic keratosis: Secondary | ICD-10-CM | POA: Diagnosis not present

## 2022-10-09 DIAGNOSIS — D0439 Carcinoma in situ of skin of other parts of face: Secondary | ICD-10-CM | POA: Diagnosis not present

## 2022-10-09 DIAGNOSIS — C44519 Basal cell carcinoma of skin of other part of trunk: Secondary | ICD-10-CM | POA: Diagnosis not present

## 2022-10-09 DIAGNOSIS — D225 Melanocytic nevi of trunk: Secondary | ICD-10-CM | POA: Diagnosis not present

## 2022-10-09 DIAGNOSIS — D2262 Melanocytic nevi of left upper limb, including shoulder: Secondary | ICD-10-CM | POA: Diagnosis not present

## 2022-10-09 DIAGNOSIS — L821 Other seborrheic keratosis: Secondary | ICD-10-CM | POA: Diagnosis not present

## 2022-10-09 DIAGNOSIS — D485 Neoplasm of uncertain behavior of skin: Secondary | ICD-10-CM | POA: Diagnosis not present

## 2022-10-31 DIAGNOSIS — C44222 Squamous cell carcinoma of skin of right ear and external auricular canal: Secondary | ICD-10-CM | POA: Diagnosis not present

## 2022-11-14 DIAGNOSIS — C44329 Squamous cell carcinoma of skin of other parts of face: Secondary | ICD-10-CM | POA: Diagnosis not present

## 2022-11-22 ENCOUNTER — Telehealth: Payer: Self-pay | Admitting: Cardiology

## 2022-11-22 MED ORDER — LOSARTAN POTASSIUM 50 MG PO TABS: 50.0000 mg | ORAL_TABLET | Freq: Every day | ORAL | 0 refills | Status: DC

## 2022-11-22 MED ORDER — AMLODIPINE BESYLATE 5 MG PO TABS: 5.0000 mg | ORAL_TABLET | Freq: Every day | ORAL | 0 refills | Status: DC

## 2022-11-22 NOTE — Telephone Encounter (Signed)
Refills sent as requested

## 2022-11-22 NOTE — Telephone Encounter (Signed)
Pt is returning call.  

## 2022-11-22 NOTE — Telephone Encounter (Signed)
Returned cal to pt, he states that he needs refills for losartan '50mg'$ , amlodipine '5mg'$ . He states that at his lat appointment he discussed this with you and states that you said to go ahead and cut these in 1/2. Here are his BP readings: 2-12 121/75 HR 126/86 HR 68, 108/78 HR 63, 10-28-22 146/82 HR 69.  OK to send these refills? losartan '50mg'$ , amlodipine '5mg'$ ?

## 2022-11-22 NOTE — Telephone Encounter (Signed)
Patient would like for Dr. Stanford Breed or APP to send in a new prescription for losartan (COZAAR) 50 MG tablet instead of losartan (COZAAR) 100 MG tablet and to send it in to [facility] # [place], [place]

## 2022-11-22 NOTE — Telephone Encounter (Signed)
I contacted patient to discuss need for new RX.   Call back number provided.

## 2022-11-22 NOTE — Telephone Encounter (Signed)
Pt would like a callback on wife's phone number. Please advise.

## 2022-12-04 DIAGNOSIS — C321 Malignant neoplasm of supraglottis: Secondary | ICD-10-CM | POA: Diagnosis not present

## 2022-12-11 DIAGNOSIS — C44519 Basal cell carcinoma of skin of other part of trunk: Secondary | ICD-10-CM | POA: Diagnosis not present

## 2022-12-11 DIAGNOSIS — D0471 Carcinoma in situ of skin of right lower limb, including hip: Secondary | ICD-10-CM | POA: Diagnosis not present

## 2023-01-15 DIAGNOSIS — D225 Melanocytic nevi of trunk: Secondary | ICD-10-CM | POA: Diagnosis not present

## 2023-01-15 DIAGNOSIS — L821 Other seborrheic keratosis: Secondary | ICD-10-CM | POA: Diagnosis not present

## 2023-01-15 DIAGNOSIS — D2262 Melanocytic nevi of left upper limb, including shoulder: Secondary | ICD-10-CM | POA: Diagnosis not present

## 2023-01-15 DIAGNOSIS — C44519 Basal cell carcinoma of skin of other part of trunk: Secondary | ICD-10-CM | POA: Diagnosis not present

## 2023-01-15 DIAGNOSIS — L570 Actinic keratosis: Secondary | ICD-10-CM | POA: Diagnosis not present

## 2023-01-15 DIAGNOSIS — D0461 Carcinoma in situ of skin of right upper limb, including shoulder: Secondary | ICD-10-CM | POA: Diagnosis not present

## 2023-01-15 DIAGNOSIS — C44319 Basal cell carcinoma of skin of other parts of face: Secondary | ICD-10-CM | POA: Diagnosis not present

## 2023-01-15 DIAGNOSIS — D485 Neoplasm of uncertain behavior of skin: Secondary | ICD-10-CM | POA: Diagnosis not present

## 2023-01-15 DIAGNOSIS — L578 Other skin changes due to chronic exposure to nonionizing radiation: Secondary | ICD-10-CM | POA: Diagnosis not present

## 2023-02-16 ENCOUNTER — Other Ambulatory Visit: Payer: Self-pay | Admitting: Cardiology

## 2023-02-27 DIAGNOSIS — C44319 Basal cell carcinoma of skin of other parts of face: Secondary | ICD-10-CM | POA: Diagnosis not present

## 2023-03-05 DIAGNOSIS — C44519 Basal cell carcinoma of skin of other part of trunk: Secondary | ICD-10-CM | POA: Diagnosis not present

## 2023-03-05 DIAGNOSIS — D0461 Carcinoma in situ of skin of right upper limb, including shoulder: Secondary | ICD-10-CM | POA: Diagnosis not present

## 2023-03-05 DIAGNOSIS — L820 Inflamed seborrheic keratosis: Secondary | ICD-10-CM | POA: Diagnosis not present

## 2023-03-07 DIAGNOSIS — M1612 Unilateral primary osteoarthritis, left hip: Secondary | ICD-10-CM | POA: Diagnosis not present

## 2023-03-08 ENCOUNTER — Other Ambulatory Visit: Payer: Self-pay | Admitting: Cardiology

## 2023-03-19 DIAGNOSIS — R296 Repeated falls: Secondary | ICD-10-CM | POA: Diagnosis not present

## 2023-03-19 DIAGNOSIS — R531 Weakness: Secondary | ICD-10-CM | POA: Diagnosis not present

## 2023-03-24 ENCOUNTER — Other Ambulatory Visit: Payer: Self-pay | Admitting: Cardiology

## 2023-03-24 DIAGNOSIS — E785 Hyperlipidemia, unspecified: Secondary | ICD-10-CM

## 2023-03-27 DIAGNOSIS — R531 Weakness: Secondary | ICD-10-CM | POA: Diagnosis not present

## 2023-03-27 DIAGNOSIS — R296 Repeated falls: Secondary | ICD-10-CM | POA: Diagnosis not present

## 2023-03-29 DIAGNOSIS — R531 Weakness: Secondary | ICD-10-CM | POA: Diagnosis not present

## 2023-03-29 DIAGNOSIS — R296 Repeated falls: Secondary | ICD-10-CM | POA: Diagnosis not present

## 2023-04-02 DIAGNOSIS — R531 Weakness: Secondary | ICD-10-CM | POA: Diagnosis not present

## 2023-04-02 DIAGNOSIS — R296 Repeated falls: Secondary | ICD-10-CM | POA: Diagnosis not present

## 2023-04-04 NOTE — Progress Notes (Unsigned)
HPI: FU CAD; s/p CABG in 1991 and SVG BMS in 2008. Echocardiogram July 2021 showed normal LV function, mild right ventricular enlargement, mild right atrial enlargement, mild mitral regurgitation, ascending aorta measuring 4.5 cm.  Also with laryngeal cancer.  Chest CT October 2023 showed ectasia of the ascending thoracic aorta measuring 4.2 cm.  Since last seen, the patient denies any dyspnea on exertion, orthopnea, PND, pedal edema, palpitations, syncope or chest pain.   Current Outpatient Medications  Medication Sig Dispense Refill   amLODipine (NORVASC) 5 MG tablet TAKE ONE TABLET BY MOUTH ONE TIME DAILY 90 tablet 0   atorvastatin (LIPITOR) 80 MG tablet TAKE ONE TABLET BY MOUTH ONE TIME DAILY AT 6PM 90 tablet 0   Coenzyme Q10 (CO Q 10 PO) Take 200 mg by mouth daily.     ezetimibe (ZETIA) 10 MG tablet TAKE ONE TABLET BY MOUTH ONE TIME DAILY 90 tablet 0   LECITHIN CONCENTRATE PO Take by mouth.     losartan (COZAAR) 50 MG tablet TAKE ONE TABLET BY MOUTH ONE TIME DAILY 90 tablet 0   Multiple Vitamin (MULTIVITAMIN) capsule Take 1 capsule by mouth daily.     Omega-3 Fatty Acids (OMEGA 3 PO) Take by mouth.     OVER THE COUNTER MEDICATION Take 20 mg by mouth daily. Lutigold     OVER THE COUNTER MEDICATION Take 2 capsules by mouth daily.     vitamin E 180 MG (400 UNITS) capsule Take 400 Units by mouth daily.     No current facility-administered medications for this visit.   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 cm    Basal cell carcinoma of skin    Broken jaw (HCC)    right lower    Cholecystitis    Coronary heart disease    has a stent   CVA (cerebral infarction) 2007   "mild"   Fatigue  07/26/2011   History of cancer chemotherapy    History of radiation therapy 06/18/2011- 08/03/2011   squamous cell tongue/69.96 Gy 42fx   Hypercholesteremia    under control   Hypertension    Myocardial infarction (HCC)    approx 2008 or 2009   Numbness    right lower    Numbness and tingling in left hand    numbess 2 fingers    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   Peripheral vascular disease (HCC)    PFO (patent foramen ovale)    Prostate cancer (HCC) 2002   Skin cancer    basal cell, squamous cell   Squamous cell carcinoma of skin    Stroke (HCC)    appro in 08 or 09    Past Surgical History:  Procedure Laterality Date   CATARACT EXTRACTION W/ INTRAOCULAR LENS  IMPLANT, BILATERAL     CHOLECYSTECTOMY  04/2009   3 surgeries involved: lap chole x2 laparotomy   CORONARY ARTERY BYPASS GRAFT  1991   triple   DIRECT LARYNGOSCOPY N/A 02/17/2020   Procedure: DIRECT LARYNGOSCOPY WITH BIOPSY;  Surgeon: Serena Colonel, MD;  Location: [facility] OR;  Service: ENT;  Laterality: N/A;   ELBOW SURGERY  42 years ago  removal left radial head due to hockey accident   EXPLORATORY LAPAROTOMY  04/2010   Dr. Andrey Campanile (3rd surgery for gall bladder)   INGUINAL HERNIA REPAIR Right 09/23/2014   Procedure: RIGHT INGUINAL HERNIA REPAIR WITH MESH;  Surgeon: Avel Peace, MD;  Location: [facility];  Service: General;  Laterality: Right;   INGUINAL HERNIA REPAIR Left 09/30/2015   Procedure: HERNIA REPAIR LEFT INGUINAL ADULT;  Surgeon: Avel Peace, MD;  Location: [facility];  Service: General;  Laterality: Left;   INSERTION OF MESH Left 09/30/2015   Procedure: INSERTION OF MESH;  Surgeon: Avel Peace, MD;  Location: [facility];  Service: General;  Laterality: Left;   MULTIPLE TOOTH EXTRACTIONS     prior to radiation   PEG TUBE PLACEMENT  06/13/11 at [facility]   10/2011 removed   PROSTATECTOMY  2002   TONSILLECTOMY     x2: as child then 1985 for Left lingual tonsil removed   TOTAL HIP ARTHROPLASTY Left 02/08/2016    Procedure: TOTAL HIP ARTHROPLASTY ANTERIOR APPROACH;  Surgeon: Gean Birchwood, MD;  Location: [facility] OR;  Service: Orthopedics;  Laterality: Left;   ULNAR NERVE TRANSPOSITION Left 10/16/2018   Procedure: ULNAR NERVE DECOMPRESSION/TRANSPOSITION;  Surgeon: Jones Broom, MD;  Location: [facility] OR;  Service: Orthopedics;  Laterality: Left;    Social History   Socioeconomic History   Marital status: Married    Spouse name: Not on file   Number of children: Not on file   Years of education: Not on file   Highest education level: Not on file  Occupational History   Not on file  Tobacco Use   Smoking status: Former    Current packs/day: 0.00    Average packs/day: 0.3 packs/day for 5.0 years (1.3 ttl pk-yrs)    Types: Cigarettes    Start date: 03/20/1961    Quit date: 03/20/1966    Years since quitting: 57.0   Smokeless tobacco: Never  Vaping Use   Vaping status: Never Used  Substance and Sexual Activity   Alcohol use: Yes    Alcohol/week: 1.0 standard drink of alcohol    Types: 1 Glasses of wine per week   Drug use: No   Sexual activity: Not on file  Other Topics Concern   Not on file  Social History Narrative   Not on file   Social Determinants of Health   Financial Resource Strain: Not on file  Food Insecurity: Not on file  Transportation Needs: Not on file  Physical Activity: Not on file  Stress: Not on file  Social Connections: Not on file  Intimate Partner Violence: Not on file    Family History  Problem Relation Age of Onset   Stroke Mother    Coronary artery disease Brother        had bypass    ROS: no fevers or chills, productive cough, hemoptysis, dysphasia, odynophagia, melena, hematochezia, dysuria, hematuria, rash, seizure activity, orthopnea, PND, pedal edema, claudication. Remaining systems are negative.  Physical Exam: Well-developed well-nourished in no acute distress.  Skin is warm and dry.  HEENT is normal.  Neck is supple.  Chest is clear to auscultation  with normal expansion.  Cardiovascular exam is regular rate and rhythm.  Abdominal exam nontender or distended. No masses palpated. Extremities show no edema. neuro grossly intact  EKG Interpretation Date/Time:  Thursday April 11 2023 11:21:46 EDT Ventricular Rate:  57 PR Interval:  204 QRS Duration:  84 QT Interval:  434 QTC Calculation: 422 R Axis:   -8  Text Interpretation: Sinus bradycardia When compared with ECG of 25-Apr-2010 13:40, Premature supraventricular complexes are no longer Present Confirmed by Olga Millers ([id number]) on 04/11/2023 11:26:38 AM    A/P  1 coronary artery disease-patient is doing well with no chest pain.  Continue aspirin and statin.  2 thoracic aortic aneurysm-we will plan follow-up CTA October 2024.  3 hypertension-blood pressure controlled.  Continue present medications and follow.  Check potassium and renal function.  4 hyperlipidemia-continue statin.  Check lipids and liver.  5 history of CVA-continue aspirin.  Of note he does have a history of PFO.  Olga Millers, MD

## 2023-04-05 DIAGNOSIS — R296 Repeated falls: Secondary | ICD-10-CM | POA: Diagnosis not present

## 2023-04-05 DIAGNOSIS — R531 Weakness: Secondary | ICD-10-CM | POA: Diagnosis not present

## 2023-04-09 DIAGNOSIS — R531 Weakness: Secondary | ICD-10-CM | POA: Diagnosis not present

## 2023-04-09 DIAGNOSIS — R296 Repeated falls: Secondary | ICD-10-CM | POA: Diagnosis not present

## 2023-04-11 ENCOUNTER — Ambulatory Visit: Payer: Medicare Other | Attending: Cardiology | Admitting: Cardiology

## 2023-04-11 ENCOUNTER — Encounter: Payer: Self-pay | Admitting: Cardiology

## 2023-04-11 VITALS — BP 140/80 | HR 57 | Ht 69.0 in | Wt 172.0 lb

## 2023-04-11 DIAGNOSIS — I10 Essential (primary) hypertension: Secondary | ICD-10-CM | POA: Diagnosis not present

## 2023-04-11 DIAGNOSIS — E785 Hyperlipidemia, unspecified: Secondary | ICD-10-CM | POA: Diagnosis not present

## 2023-04-11 DIAGNOSIS — I7121 Aneurysm of the ascending aorta, without rupture: Secondary | ICD-10-CM | POA: Diagnosis not present

## 2023-04-11 DIAGNOSIS — I2510 Atherosclerotic heart disease of native coronary artery without angina pectoris: Secondary | ICD-10-CM | POA: Diagnosis not present

## 2023-04-11 NOTE — Patient Instructions (Signed)

## 2023-04-12 DIAGNOSIS — R531 Weakness: Secondary | ICD-10-CM | POA: Diagnosis not present

## 2023-04-12 DIAGNOSIS — R296 Repeated falls: Secondary | ICD-10-CM | POA: Diagnosis not present

## 2023-04-12 LAB — COMPREHENSIVE METABOLIC PANEL: BUN/Creatinine Ratio: 25 — ABNORMAL HIGH (ref 10–24)

## 2023-04-16 DIAGNOSIS — R531 Weakness: Secondary | ICD-10-CM | POA: Diagnosis not present

## 2023-04-16 DIAGNOSIS — R296 Repeated falls: Secondary | ICD-10-CM | POA: Diagnosis not present

## 2023-04-19 DIAGNOSIS — R296 Repeated falls: Secondary | ICD-10-CM | POA: Diagnosis not present

## 2023-04-19 DIAGNOSIS — R531 Weakness: Secondary | ICD-10-CM | POA: Diagnosis not present

## 2023-04-22 DIAGNOSIS — R296 Repeated falls: Secondary | ICD-10-CM | POA: Diagnosis not present

## 2023-04-22 DIAGNOSIS — R531 Weakness: Secondary | ICD-10-CM | POA: Diagnosis not present

## 2023-04-29 DIAGNOSIS — R296 Repeated falls: Secondary | ICD-10-CM | POA: Diagnosis not present

## 2023-04-29 DIAGNOSIS — R531 Weakness: Secondary | ICD-10-CM | POA: Diagnosis not present

## 2023-05-01 DIAGNOSIS — R296 Repeated falls: Secondary | ICD-10-CM | POA: Diagnosis not present

## 2023-05-01 DIAGNOSIS — R531 Weakness: Secondary | ICD-10-CM | POA: Diagnosis not present

## 2023-05-06 DIAGNOSIS — N183 Chronic kidney disease, stage 3 unspecified: Secondary | ICD-10-CM | POA: Diagnosis not present

## 2023-05-06 DIAGNOSIS — E538 Deficiency of other specified B group vitamins: Secondary | ICD-10-CM | POA: Diagnosis not present

## 2023-05-06 DIAGNOSIS — R296 Repeated falls: Secondary | ICD-10-CM | POA: Diagnosis not present

## 2023-05-06 DIAGNOSIS — R7989 Other specified abnormal findings of blood chemistry: Secondary | ICD-10-CM | POA: Diagnosis not present

## 2023-05-06 DIAGNOSIS — M791 Myalgia, unspecified site: Secondary | ICD-10-CM | POA: Diagnosis not present

## 2023-05-06 DIAGNOSIS — E559 Vitamin D deficiency, unspecified: Secondary | ICD-10-CM | POA: Diagnosis not present

## 2023-05-06 DIAGNOSIS — R531 Weakness: Secondary | ICD-10-CM | POA: Diagnosis not present

## 2023-05-09 DIAGNOSIS — R051 Acute cough: Secondary | ICD-10-CM | POA: Diagnosis not present

## 2023-05-09 DIAGNOSIS — Z03818 Encounter for observation for suspected exposure to other biological agents ruled out: Secondary | ICD-10-CM | POA: Diagnosis not present

## 2023-05-09 DIAGNOSIS — J069 Acute upper respiratory infection, unspecified: Secondary | ICD-10-CM | POA: Diagnosis not present

## 2023-05-10 DIAGNOSIS — N183 Chronic kidney disease, stage 3 unspecified: Secondary | ICD-10-CM | POA: Diagnosis not present

## 2023-05-10 DIAGNOSIS — R946 Abnormal results of thyroid function studies: Secondary | ICD-10-CM | POA: Diagnosis not present

## 2023-05-10 DIAGNOSIS — M791 Myalgia, unspecified site: Secondary | ICD-10-CM | POA: Diagnosis not present

## 2023-05-10 DIAGNOSIS — R7989 Other specified abnormal findings of blood chemistry: Secondary | ICD-10-CM | POA: Diagnosis not present

## 2023-05-16 DIAGNOSIS — R531 Weakness: Secondary | ICD-10-CM | POA: Diagnosis not present

## 2023-05-16 DIAGNOSIS — R296 Repeated falls: Secondary | ICD-10-CM | POA: Diagnosis not present

## 2023-05-22 ENCOUNTER — Other Ambulatory Visit: Payer: Self-pay | Admitting: Cardiology

## 2023-06-08 ENCOUNTER — Other Ambulatory Visit: Payer: Self-pay | Admitting: Cardiology

## 2023-06-10 ENCOUNTER — Other Ambulatory Visit: Payer: Self-pay | Admitting: *Deleted

## 2023-06-10 DIAGNOSIS — I7121 Aneurysm of the ascending aorta, without rupture: Secondary | ICD-10-CM

## 2023-06-11 DIAGNOSIS — R1312 Dysphagia, oropharyngeal phase: Secondary | ICD-10-CM | POA: Insufficient documentation

## 2023-06-11 DIAGNOSIS — C321 Malignant neoplasm of supraglottis: Secondary | ICD-10-CM | POA: Diagnosis not present

## 2023-06-11 DIAGNOSIS — J387 Other diseases of larynx: Secondary | ICD-10-CM | POA: Diagnosis not present

## 2023-06-11 MED ORDER — ATORVASTATIN CALCIUM 80 MG PO TABS: 80.0000 mg | ORAL_TABLET | Freq: Every day | ORAL | 3 refills | Status: DC

## 2023-06-11 NOTE — Addendum Note (Signed)
Addended by: Stevan Born on: 06/11/2023 08:11 AM   Modules accepted: Orders

## 2023-06-12 DIAGNOSIS — D0439 Carcinoma in situ of skin of other parts of face: Secondary | ICD-10-CM | POA: Diagnosis not present

## 2023-06-12 DIAGNOSIS — D2262 Melanocytic nevi of left upper limb, including shoulder: Secondary | ICD-10-CM | POA: Diagnosis not present

## 2023-06-12 DIAGNOSIS — L859 Epidermal thickening, unspecified: Secondary | ICD-10-CM | POA: Diagnosis not present

## 2023-06-12 DIAGNOSIS — L821 Other seborrheic keratosis: Secondary | ICD-10-CM | POA: Diagnosis not present

## 2023-06-12 DIAGNOSIS — L570 Actinic keratosis: Secondary | ICD-10-CM | POA: Diagnosis not present

## 2023-06-12 DIAGNOSIS — L578 Other skin changes due to chronic exposure to nonionizing radiation: Secondary | ICD-10-CM | POA: Diagnosis not present

## 2023-06-12 DIAGNOSIS — C44529 Squamous cell carcinoma of skin of other part of trunk: Secondary | ICD-10-CM | POA: Diagnosis not present

## 2023-06-12 DIAGNOSIS — D485 Neoplasm of uncertain behavior of skin: Secondary | ICD-10-CM | POA: Diagnosis not present

## 2023-06-12 DIAGNOSIS — D045 Carcinoma in situ of skin of trunk: Secondary | ICD-10-CM | POA: Diagnosis not present

## 2023-06-12 DIAGNOSIS — D225 Melanocytic nevi of trunk: Secondary | ICD-10-CM | POA: Diagnosis not present

## 2023-06-16 DIAGNOSIS — B029 Zoster without complications: Secondary | ICD-10-CM | POA: Diagnosis not present

## 2023-06-23 ENCOUNTER — Other Ambulatory Visit: Payer: Self-pay | Admitting: Cardiology

## 2023-06-24 ENCOUNTER — Ambulatory Visit
Admission: RE | Admit: 2023-06-24 | Discharge: 2023-06-24 | Disposition: A | Discharge status: Home / Self Care | Payer: Medicare Other | Source: Ambulatory Visit | Attending: Family Medicine | Admitting: Family Medicine

## 2023-06-24 ENCOUNTER — Other Ambulatory Visit: Payer: Self-pay | Admitting: Family Medicine

## 2023-06-24 ENCOUNTER — Inpatient Hospital Stay: Admission: RE | Admit: 2023-06-24 | Payer: Medicare Other | Source: Ambulatory Visit

## 2023-06-24 DIAGNOSIS — J22 Unspecified acute lower respiratory infection: Secondary | ICD-10-CM | POA: Diagnosis not present

## 2023-06-24 DIAGNOSIS — R059 Cough, unspecified: Secondary | ICD-10-CM | POA: Diagnosis not present

## 2023-06-24 DIAGNOSIS — B029 Zoster without complications: Secondary | ICD-10-CM | POA: Diagnosis not present

## 2023-06-27 DIAGNOSIS — B029 Zoster without complications: Secondary | ICD-10-CM | POA: Diagnosis not present

## 2023-06-27 DIAGNOSIS — J22 Unspecified acute lower respiratory infection: Secondary | ICD-10-CM | POA: Diagnosis not present

## 2023-07-10 ENCOUNTER — Inpatient Hospital Stay
Admission: RE | Admit: 2023-07-10 | Discharge: 2023-07-10 | Disposition: A | Discharge status: Home / Self Care | Payer: Medicare Other | Source: Ambulatory Visit | Attending: Cardiology | Admitting: Cardiology

## 2023-07-10 DIAGNOSIS — Z23 Encounter for immunization: Secondary | ICD-10-CM | POA: Diagnosis not present

## 2023-07-10 DIAGNOSIS — I7121 Aneurysm of the ascending aorta, without rupture: Secondary | ICD-10-CM

## 2023-07-10 DIAGNOSIS — R911 Solitary pulmonary nodule: Secondary | ICD-10-CM | POA: Diagnosis not present

## 2023-07-10 DIAGNOSIS — I2510 Atherosclerotic heart disease of native coronary artery without angina pectoris: Secondary | ICD-10-CM | POA: Diagnosis not present

## 2023-07-10 MED ORDER — IOPAMIDOL (ISOVUE-370) INJECTION 76%
500.0000 mL | Freq: Once | INTRAVENOUS | Status: AC | PRN
  Administered 2023-07-10: 60 mL via INTRAVENOUS

## 2023-07-13 ENCOUNTER — Other Ambulatory Visit: Payer: Self-pay | Admitting: Cardiology

## 2023-07-13 DIAGNOSIS — E785 Hyperlipidemia, unspecified: Secondary | ICD-10-CM

## 2023-07-18 ENCOUNTER — Encounter: Payer: Self-pay | Admitting: *Deleted

## 2023-07-29 DIAGNOSIS — B029 Zoster without complications: Secondary | ICD-10-CM | POA: Diagnosis not present

## 2023-08-12 DIAGNOSIS — Z85810 Personal history of malignant neoplasm of tongue: Secondary | ICD-10-CM | POA: Diagnosis not present

## 2023-08-12 DIAGNOSIS — Z0000 Encounter for general adult medical examination without abnormal findings: Secondary | ICD-10-CM | POA: Diagnosis not present

## 2023-08-12 DIAGNOSIS — N5203 Combined arterial insufficiency and corporo-venous occlusive erectile dysfunction: Secondary | ICD-10-CM | POA: Diagnosis not present

## 2023-08-12 DIAGNOSIS — N183 Chronic kidney disease, stage 3 unspecified: Secondary | ICD-10-CM | POA: Diagnosis not present

## 2023-08-12 DIAGNOSIS — B0229 Other postherpetic nervous system involvement: Secondary | ICD-10-CM | POA: Diagnosis not present

## 2023-08-12 DIAGNOSIS — F320 Major depressive disorder, single episode, mild: Secondary | ICD-10-CM | POA: Diagnosis not present

## 2023-08-12 DIAGNOSIS — R7303 Prediabetes: Secondary | ICD-10-CM | POA: Diagnosis not present

## 2023-08-12 DIAGNOSIS — E7800 Pure hypercholesterolemia, unspecified: Secondary | ICD-10-CM | POA: Diagnosis not present

## 2023-08-12 DIAGNOSIS — I10 Essential (primary) hypertension: Secondary | ICD-10-CM | POA: Diagnosis not present

## 2023-08-12 DIAGNOSIS — Z8546 Personal history of malignant neoplasm of prostate: Secondary | ICD-10-CM | POA: Diagnosis not present

## 2023-08-12 DIAGNOSIS — K5900 Constipation, unspecified: Secondary | ICD-10-CM | POA: Diagnosis not present

## 2023-09-01 ENCOUNTER — Other Ambulatory Visit: Payer: Self-pay | Admitting: Cardiology

## 2023-09-01 DIAGNOSIS — E785 Hyperlipidemia, unspecified: Secondary | ICD-10-CM

## 2023-09-03 DIAGNOSIS — E559 Vitamin D deficiency, unspecified: Secondary | ICD-10-CM | POA: Diagnosis not present

## 2023-09-03 DIAGNOSIS — Z85810 Personal history of malignant neoplasm of tongue: Secondary | ICD-10-CM | POA: Diagnosis not present

## 2023-09-03 DIAGNOSIS — I10 Essential (primary) hypertension: Secondary | ICD-10-CM | POA: Diagnosis not present

## 2023-09-03 DIAGNOSIS — R7989 Other specified abnormal findings of blood chemistry: Secondary | ICD-10-CM | POA: Diagnosis not present

## 2023-09-03 DIAGNOSIS — R946 Abnormal results of thyroid function studies: Secondary | ICD-10-CM | POA: Diagnosis not present

## 2023-09-03 DIAGNOSIS — R7303 Prediabetes: Secondary | ICD-10-CM | POA: Diagnosis not present

## 2023-09-03 DIAGNOSIS — Z8546 Personal history of malignant neoplasm of prostate: Secondary | ICD-10-CM | POA: Diagnosis not present

## 2023-09-05 DIAGNOSIS — R1312 Dysphagia, oropharyngeal phase: Secondary | ICD-10-CM | POA: Diagnosis not present

## 2023-09-05 DIAGNOSIS — R6339 Other feeding difficulties: Secondary | ICD-10-CM | POA: Diagnosis not present

## 2023-09-23 DIAGNOSIS — R7989 Other specified abnormal findings of blood chemistry: Secondary | ICD-10-CM | POA: Diagnosis not present

## 2023-09-23 DIAGNOSIS — R946 Abnormal results of thyroid function studies: Secondary | ICD-10-CM | POA: Diagnosis not present

## 2023-09-23 DIAGNOSIS — B0229 Other postherpetic nervous system involvement: Secondary | ICD-10-CM | POA: Diagnosis not present

## 2023-09-23 DIAGNOSIS — F324 Major depressive disorder, single episode, in partial remission: Secondary | ICD-10-CM | POA: Diagnosis not present

## 2023-10-28 DIAGNOSIS — M25552 Pain in left hip: Secondary | ICD-10-CM | POA: Diagnosis not present

## 2023-11-25 DIAGNOSIS — N183 Chronic kidney disease, stage 3 unspecified: Secondary | ICD-10-CM | POA: Diagnosis not present

## 2023-11-25 DIAGNOSIS — I10 Essential (primary) hypertension: Secondary | ICD-10-CM | POA: Diagnosis not present

## 2023-12-10 DIAGNOSIS — C321 Malignant neoplasm of supraglottis: Secondary | ICD-10-CM | POA: Diagnosis not present

## 2023-12-10 DIAGNOSIS — R1312 Dysphagia, oropharyngeal phase: Secondary | ICD-10-CM | POA: Diagnosis not present

## 2023-12-16 DIAGNOSIS — N183 Chronic kidney disease, stage 3 unspecified: Secondary | ICD-10-CM | POA: Diagnosis not present

## 2023-12-16 DIAGNOSIS — I10 Essential (primary) hypertension: Secondary | ICD-10-CM | POA: Diagnosis not present

## 2023-12-16 DIAGNOSIS — E7800 Pure hypercholesterolemia, unspecified: Secondary | ICD-10-CM | POA: Diagnosis not present

## 2023-12-16 DIAGNOSIS — Z8546 Personal history of malignant neoplasm of prostate: Secondary | ICD-10-CM | POA: Diagnosis not present

## 2023-12-18 DIAGNOSIS — D0439 Carcinoma in situ of skin of other parts of face: Secondary | ICD-10-CM | POA: Diagnosis not present

## 2023-12-18 DIAGNOSIS — D225 Melanocytic nevi of trunk: Secondary | ICD-10-CM | POA: Diagnosis not present

## 2023-12-18 DIAGNOSIS — D485 Neoplasm of uncertain behavior of skin: Secondary | ICD-10-CM | POA: Diagnosis not present

## 2023-12-18 DIAGNOSIS — L578 Other skin changes due to chronic exposure to nonionizing radiation: Secondary | ICD-10-CM | POA: Diagnosis not present

## 2023-12-18 DIAGNOSIS — D0462 Carcinoma in situ of skin of left upper limb, including shoulder: Secondary | ICD-10-CM | POA: Diagnosis not present

## 2023-12-18 DIAGNOSIS — L570 Actinic keratosis: Secondary | ICD-10-CM | POA: Diagnosis not present

## 2023-12-18 DIAGNOSIS — D2262 Melanocytic nevi of left upper limb, including shoulder: Secondary | ICD-10-CM | POA: Diagnosis not present

## 2023-12-18 DIAGNOSIS — D045 Carcinoma in situ of skin of trunk: Secondary | ICD-10-CM | POA: Diagnosis not present

## 2023-12-18 DIAGNOSIS — L821 Other seborrheic keratosis: Secondary | ICD-10-CM | POA: Diagnosis not present

## 2023-12-31 DIAGNOSIS — D045 Carcinoma in situ of skin of trunk: Secondary | ICD-10-CM | POA: Diagnosis not present

## 2023-12-31 DIAGNOSIS — C44529 Squamous cell carcinoma of skin of other part of trunk: Secondary | ICD-10-CM | POA: Diagnosis not present

## 2024-01-01 ENCOUNTER — Telehealth: Payer: Self-pay | Admitting: Cardiology

## 2024-01-01 DIAGNOSIS — R946 Abnormal results of thyroid function studies: Secondary | ICD-10-CM | POA: Diagnosis not present

## 2024-01-01 DIAGNOSIS — I10 Essential (primary) hypertension: Secondary | ICD-10-CM | POA: Diagnosis not present

## 2024-01-01 DIAGNOSIS — I959 Hypotension, unspecified: Secondary | ICD-10-CM | POA: Diagnosis not present

## 2024-01-01 DIAGNOSIS — R7989 Other specified abnormal findings of blood chemistry: Secondary | ICD-10-CM | POA: Diagnosis not present

## 2024-01-01 NOTE — Telephone Encounter (Signed)
 Pt c/o BP issue: STAT if pt c/o blurred vision, one-sided weakness or slurred speech.  STAT if BP is GREATER than 180/120 TODAY.  STAT if BP is LESS than 90/60 and SYMPTOMATIC TODAY  1. What is your BP concern? Too low  2. Have you taken any BP medication today?no not today  3. What are your last 5 BP readings? 69/42, 68/40   4. Are you having any other symptoms (ex. Dizziness, headache, blurred vision, passed out)? Balance issues,fatigued

## 2024-01-01 NOTE — Telephone Encounter (Signed)
 Returned call to pt Pt informed of providers recommendations. Pt verbalized understanding. He will continue to take and log BP/HR 1-2 hours after taking medications. He will call if any new sx or changes. Will follow up with endocrine for other changes as well.

## 2024-01-01 NOTE — Telephone Encounter (Signed)
 Received call from pt/wife she states that pt went to [facility] and saw Dr Vick Gram and pt's BP was way too low 69/42. Pt took BP at home 68/40. Pt states that he is feeling fatigued and is having balance issues and denies any other symptoms. 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  Went over pt's medications and pt has adjusted losartan down to 25mg  daily and decrease amlodipine to 2.5mg  daily. These medication pt adjusted himself because of blood pressure. Changed on medication list in EPIC.  He was told to stop the amlodipine today at his Endo appt but he is unsure if he should do this. He does want to know if he should do so.  Schedule appointment to discuss BP and TSH, etc 01-16-24 @ 245pm. Informed pt that this appt will be at our new [place] office. Verbalized understanding. Address given. He will arrive early.  Informed pt that due to BP issue he will need to go to the ER for evaluation. Pt states that he will not go to the ER. He will call back with any other issues.

## 2024-01-13 DIAGNOSIS — C44329 Squamous cell carcinoma of skin of other parts of face: Secondary | ICD-10-CM | POA: Diagnosis not present

## 2024-01-15 DIAGNOSIS — E7800 Pure hypercholesterolemia, unspecified: Secondary | ICD-10-CM | POA: Diagnosis not present

## 2024-01-15 DIAGNOSIS — Z8546 Personal history of malignant neoplasm of prostate: Secondary | ICD-10-CM | POA: Diagnosis not present

## 2024-01-15 DIAGNOSIS — I10 Essential (primary) hypertension: Secondary | ICD-10-CM | POA: Diagnosis not present

## 2024-01-15 DIAGNOSIS — N183 Chronic kidney disease, stage 3 unspecified: Secondary | ICD-10-CM | POA: Diagnosis not present

## 2024-01-15 NOTE — Progress Notes (Unsigned)
 Office Visit    Patient Name: Jason Noble Date of Encounter: 01/16/2024  Primary Care Provider:  Roselind Congo, MD Primary Cardiologist:  Jason Angel, MD  Chief Complaint    83 year old male with a history of CAD s/p CABG in 1991, BMS-SVG in 2008, thoracic aortic aneurysm, mitral valve regurgitation, hypertension, hyperlipidemia, CVA, PFO, hyperthyroidism, and laryngeal cancer who presents for follow-up related to CAD, thoracic aortic aneurysm, and hypertension/hypotension.  Past Medical History    Past Medical History:  Diagnosis Date   Abscess of gallbladder    Aneurysm of thoracic aorta (HCC)    4.5 cm    Basal cell carcinoma of skin    Broken jaw (HCC)    right lower    Cholecystitis    Coronary heart disease    has a stent   CVA (cerebral infarction) 2007   "mild"   Fatigue 07/26/2011   History of cancer chemotherapy    History of radiation therapy 06/18/2011- 08/03/2011   squamous cell tongue/69.96 Gy 54fx   Hypercholesteremia    under control   Hypertension    Myocardial infarction (HCC)    approx 2008 or 2009   Numbness    right lower    Numbness and tingling in left hand    numbess 2 fingers    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   Peripheral vascular disease (HCC)    PFO (patent foramen ovale)    Prostate cancer (HCC) 2002   Skin cancer    basal cell, squamous cell   Squamous cell carcinoma of skin    Stroke (HCC)    appro in 08 or 09   Past Surgical History:  Procedure Laterality Date   CATARACT EXTRACTION W/ INTRAOCULAR LENS  IMPLANT, BILATERAL     CHOLECYSTECTOMY  04/2009   3 surgeries involved: lap chole x2 laparotomy   CORONARY ARTERY BYPASS GRAFT  1991   triple   DIRECT LARYNGOSCOPY N/A 02/17/2020   Procedure: DIRECT LARYNGOSCOPY WITH BIOPSY;  Surgeon: Jason Mellow, MD;  Location: [facility] OR;  Service: ENT;  Laterality: N/A;   ELBOW SURGERY  42 years ago   removal left radial head due to hockey accident   EXPLORATORY  LAPAROTOMY  04/2010   Dr. Elvan Noble (3rd surgery for gall bladder)   INGUINAL HERNIA REPAIR Right 09/23/2014   Procedure: RIGHT INGUINAL HERNIA REPAIR WITH MESH;  Surgeon: Jason Hollow, MD;  Location: [facility] ORS;  Service: General;  Laterality: Right;   INGUINAL HERNIA REPAIR Left 09/30/2015   Procedure: HERNIA REPAIR LEFT INGUINAL ADULT;  Surgeon: Jason Hollow, MD;  Location: [facility];  Service: General;  Laterality: Left;   INSERTION OF MESH Left 09/30/2015   Procedure: INSERTION OF MESH;  Surgeon: Jason Hollow, MD;  Location: [facility];  Service: General;  Laterality: Left;   MULTIPLE TOOTH EXTRACTIONS     prior to radiation   PEG TUBE PLACEMENT  06/13/11 at [facility]   10/2011 removed   PROSTATECTOMY  2002   TONSILLECTOMY     x2: as child then 1985 for Left lingual tonsil removed   TOTAL HIP ARTHROPLASTY Left 02/08/2016   Procedure: TOTAL HIP ARTHROPLASTY ANTERIOR APPROACH;  Surgeon: Jason Hamburger, MD;  Location: [facility] OR;  Service: Orthopedics;  Laterality: Left;   ULNAR NERVE TRANSPOSITION Left 10/16/2018   Procedure: ULNAR NERVE DECOMPRESSION/TRANSPOSITION;  Surgeon: Jason Cristal, MD;  Location: [facility] OR;  Service: Orthopedics;  Laterality: Left;    Allergies  No Known Allergies   Labs/Other  Studies Reviewed    The following studies were reviewed today:  Cardiac Studies & Procedures   ______________________________________________________________________________________________     ECHOCARDIOGRAM  ECHOCARDIOGRAM COMPLETE 04/10/2018  Narrative *[facility]* [address] [place], [place] [address] [phone]  ------------------------------------------------------------------- Transthoracic Echocardiography  Patient:    Jason, Noble MR #:       [id number] Study Date: 04/10/2018 Gender:     M Age:        12 Height:     175.3 cm Weight:     79.3 kg BSA:        1.98 m^2 Pt. Status: Room:  ATTENDING    Verdell Given     Jason Noble  M REFERRING    Jason Noble M SONOGRAPHER  Jason Noble, RCS PERFORMING   [facility], Outpatient  cc:  ------------------------------------------------------------------- LV EF: 55% -   60%  ------------------------------------------------------------------- Indications:      Thoracic Aortic Aneurysm (I71.2).  ------------------------------------------------------------------- History:   PMH:  PFO, PVD, HLD.  Stroke.  PMH:   Myocardial infarction.  Risk factors:  Hypertension.  ------------------------------------------------------------------- Study Conclusions  - Left ventricle: The cavity size was normal. Wall thickness was increased in a pattern of mild LVH. Systolic function was normal. The estimated ejection fraction was in the range of 55% to 60%. Wall motion was normal; there were no regional wall motion abnormalities. Doppler parameters are consistent with abnormal left ventricular relaxation (grade 1 diastolic dysfunction). - Aortic root: The aortic root was moderately dilated. - Mitral valve: Calcified annulus. - Left atrium: The atrium was mildly dilated. - Atrial septum: There was an atrial septal aneurysm.  Impressions:  - Normal LV systolic function; mild LVH; mild diastolic dysfunction; moderately dilated aortic root (4.6 cm); suggest CTA or MRA to further assess; mild LAE; atrial septal aneurysm.  ------------------------------------------------------------------- Labs, prior tests, procedures, and surgery: Coronary artery bypass grafting.  ------------------------------------------------------------------- Study data:  No prior study was available for comparison.  Study status:  Routine.  Procedure:  The patient reported no pain pre or post test. Transthoracic echocardiography. Image quality was adequate.          Transthoracic echocardiography.  M-mode, complete 2D, spectral Doppler, and color Doppler.  Birthdate: Patient birthdate: [birth year].  Age:   Patient is 83 yr old.  Sex: Gender: male.    BMI: 25.8 kg/m^2.  Blood pressure:     166/82 Patient status:  Outpatient.  Study date:  Study date: 04/10/2018. Study time: 09:26 AM.  Location:  [facility]  -------------------------------------------------------------------  ------------------------------------------------------------------- Left ventricle:  The cavity size was normal. Wall thickness was increased in a pattern of mild LVH. Systolic function was normal. The estimated ejection fraction was in the range of 55% to 60%. Wall motion was normal; there were no regional wall motion abnormalities. Doppler parameters are consistent with abnormal left ventricular relaxation (grade 1 diastolic dysfunction).  ------------------------------------------------------------------- Aortic valve:   Trileaflet; mildly thickened leaflets. Mobility was not restricted.  Doppler:  Transvalvular velocity was within the normal range. There was no stenosis. There was no regurgitation.  ------------------------------------------------------------------- Aorta:  Aortic root: The aortic root was moderately dilated.  ------------------------------------------------------------------- Mitral valve:   Calcified annulus. Mobility was not restricted. Doppler:  Transvalvular velocity was within the normal range. There was no evidence for stenosis. There was trivial regurgitation.  ------------------------------------------------------------------- Left atrium:  The atrium was mildly dilated.  ------------------------------------------------------------------- Atrial septum:  There was an atrial septal aneurysm.  ------------------------------------------------------------------- Right ventricle:  The cavity size  was normal. Systolic function was normal.  ------------------------------------------------------------------- Pulmonic valve:    Doppler:  Transvalvular velocity was within the normal  range. There was no evidence for stenosis. There was trivial regurgitation.  ------------------------------------------------------------------- Tricuspid valve:   Structurally normal valve.    Doppler: Transvalvular velocity was within the normal range. There was trivial regurgitation.  ------------------------------------------------------------------- Pulmonary artery:   Systolic pressure was within the normal range.  ------------------------------------------------------------------- Right atrium:  The atrium was normal in size.  ------------------------------------------------------------------- Pericardium:  There was no pericardial effusion.  ------------------------------------------------------------------- Systemic veins: Inferior vena cava: The vessel was normal in size. The respirophasic diameter changes were in the normal range (= 50%), consistent with normal central venous pressure. Diameter: 16 mm.  ------------------------------------------------------------------- Measurements  IVC                                        Value        Reference ID                                         16    mm     ----------  Left ventricle                             Value        Reference LV ID, ED, PLAX chordal                    49.3  mm     43 - 52 LV ID, ES, PLAX chordal                    36.5  mm     23 - 38 LV fx shortening, PLAX chordal     (L)     26    %      >=29 LV PW thickness, ED                        13.1  mm     ---------- IVS/LV PW ratio, ED                        0.92         <=1.3 Stroke volume, 2D                          79    ml     ---------- Stroke volume/bsa, 2D                      40    ml/m^2 ---------- LV e&', lateral                             5.55  cm/s   ---------- LV E/e&', lateral                           9.44         ---------- LV e&', medial  4.24  cm/s   ---------- LV E/e&', medial                             12.36        ---------- LV e&', average                             4.9   cm/s   ---------- LV E/e&', average                           10.7         ----------  Ventricular septum                         Value        Reference IVS thickness, ED                          12    mm     ----------  LVOT                                       Value        Reference LVOT ID, S                                 22    mm     ---------- LVOT area                                  3.8   cm^2   ---------- LVOT peak velocity, S                      79.1  cm/s   ---------- LVOT mean velocity, S                      53.5  cm/s   ---------- LVOT VTI, S                                20.9  cm     ----------  Aorta                                      Value        Reference Aortic root ID, ED                         46    mm     ----------  Left atrium                                Value        Reference LA ID, A-P, ES                             36  mm     ---------- LA ID/bsa, A-P                             1.82  cm/m^2 <=2.2 LA volume, S                               68.2  ml     ---------- LA volume/bsa, S                           34.5  ml/m^2 ---------- LA volume, ES, 1-p A4C                     66.5  ml     ---------- LA volume/bsa, ES, 1-p A4C                 33.7  ml/m^2 ---------- LA volume, ES, 1-p A2C                     71.5  ml     ---------- LA volume/bsa, ES, 1-p A2C                 36.2  ml/m^2 ----------  Mitral valve                               Value        Reference Mitral E-wave peak velocity                52.4  cm/s   ---------- Mitral A-wave peak velocity                72.3  cm/s   ---------- Mitral deceleration time           (H)     394   ms     150 - 230 Mitral E/A ratio, peak                     0.7          ----------  Pulmonary arteries                         Value        Reference PA pressure, S, DP                         21    mm Hg  <=30  Tricuspid valve                             Value        Reference Tricuspid regurg peak velocity             213   cm/s   ---------- Tricuspid peak RV-RA gradient              18    mm Hg  ---------- Tricuspid maximal regurg velocity,         213   cm/s   ---------- PISA  Right atrium  Value        Reference RA ID, S-I, ES, A4C                (H)     57.5  mm     34 - 49 RA area, ES, A4C                           16.1  cm^2   8.3 - 19.5 RA volume, ES, A/L                         36.7  ml     ---------- RA volume/bsa, ES, A/L                     18.6  ml/m^2 ----------  Systemic veins                             Value        Reference Estimated CVP                              3     mm Hg  ----------  Right ventricle                            Value        Reference TAPSE                                      27.7  mm     ---------- RV pressure, S, DP                         21    mm Hg  <=30 RV s&', lateral, S                          9.25  cm/s   ----------  Legend: (L)  and  (H)  mark values outside specified reference range.  ------------------------------------------------------------------- Prepared and Electronically Authenticated by  Jason Noble 2019-07-25T13:46:37          ______________________________________________________________________________________________     Recent Labs: 04/11/2023: ALT 37; BUN 33; Creatinine, Ser 1.31; Potassium 4.8; Sodium 140  Recent Lipid Panel    Component Value Date/Time   CHOL 135 04/11/2023 1200   TRIG 54 04/11/2023 1200   HDL 65 04/11/2023 1200   CHOLHDL 2.1 04/11/2023 1200   CHOLHDL 2.1 12/28/2016 0923   VLDL 7 12/28/2016 0923   LDLCALC 58 04/11/2023 1200   LDLDIRECT 168.1 12/15/2012 1539    History of Present Illness    83 year old male with the above past medical history including CAD s/p CABG in 1991, BMS-SVG in 2008, thoracic aortic aneurysm, mitral valve regurgitation, hypertension, hyperlipidemia, CVA,  PFO, hyperthyroidism, and laryngeal cancer.   Echocardiogram in July 2021 showed normal LV function, mild right ventricular enlargement, mild right atrial enlargement, mild mitral valve regurgitation, ascending aorta measuring 4.5 cm.  CT chest October 2023 showed ectasia of the ascending thoracic aorta measuring 4.2 cm.  He was last seen in the office on 04/11/2023 was stable from a cardiac standpoint.  He denied symptoms concerning for angina.  CT chest aorta in 06/2023 showed stable dilation of the aortic root measuring 4.1 cm.  Repeat CT chest aorta was recommended in 2 years.  He contacted our office on 01/01/2024 with concern for hypotension, BP in the 60s with associated weakness, fatigue.  He was advised to stop all antihypertensive medications including amlodipine  and losartan .  He declined ED evaluation.  He presents today for follow-up accompanied by his wife. Since his last visit and since he contacted our office he been stable overall from a cardiac standpoint.  His BP has been stable since discontinuing his medication.  His BP has been stable since discontinuing his medications.  His BP has been stable since discontinuing his BP medications.   He reports a mildly unsteady gait (this is not entirely new). He denies any  palpitations, dizziness, presyncope or syncope, denies chest pain, dyspnea, edema, PND, orthopnea, weight gain. He recently underwent removal of a basal cell carcinoma to his scalp.   Home Medications    Current Outpatient Medications  Medication Sig Dispense Refill   atorvastatin  (LIPITOR) 80 MG tablet Take 1 tablet (80 mg total) by mouth daily. 90 tablet 3   Coenzyme Q10 (CO Q 10 PO) Take 200 mg by mouth daily.     ezetimibe  (ZETIA ) 10 MG tablet TAKE ONE TABLET BY MOUTH ONCE A DAY 90 tablet 1   LECITHIN CONCENTRATE PO Take by mouth. 2 times per week     Multiple Vitamin (MULTIVITAMIN) capsule Take 1 capsule by mouth daily.     Omega-3 Fatty Acids (OMEGA 3 PO) Take by  mouth. 1 per day     OVER THE COUNTER MEDICATION Take 20 mg by mouth daily. Lutigold     OVER THE COUNTER MEDICATION Take 2 capsules by mouth daily.     vitamin E 180 MG (400 UNITS) capsule Take 400 Units by mouth daily.     No current facility-administered medications for this visit.   Facility-Administered Medications Ordered in Other Visits  Medication Dose Route Frequency Provider Last Rate Last Admin   topical emolient (BIAFINE) emulsion   Topical BID Johna Myers, MD   Given at 07/30/11 1405     Review of Systems    He denies chest pain, palpitations, dyspnea, pnd, orthopnea, n, v, dizziness, syncope, edema, weight gain, or early satiety. All other systems reviewed and are otherwise negative except as noted above.   Physical Exam    VS:  BP 108/62   Pulse 65   Ht 5\' 9"  (1.753 m)   Wt 165 lb 3.2 oz (74.9 kg)   SpO2 95%   BMI 24.40 kg/m   GEN: Well nourished, well developed, in no acute distress. HEENT: normal. Neck: Supple, no JVD, carotid bruits, or masses. Cardiac: RRR, no murmurs, rubs, or gallops. No clubbing, cyanosis, edema.  Radials/DP/PT 2+ and equal bilaterally.  Respiratory:  Respirations regular and unlabored, clear to auscultation bilaterally. GI: Soft, nontender, nondistended, BS + x 4. MS: no deformity or atrophy. Skin: warm and dry, no rash. Neuro:  Strength and sensation are intact. Psych: Normal affect.  Accessory Clinical Findings    ECG personally reviewed by me today - EKG Interpretation Date/Time:  Thursday Jan 16 2024 15:15:01 EDT Ventricular Rate:  65 PR Interval:  188 QRS Duration:  82 QT Interval:  444 QTC Calculation: 461 R Axis:   -20  Text Interpretation: Sinus rhythm with Premature supraventricular complexes Minimal voltage criteria for LVH, may be normal variant ( R in aVL ) When compared  with ECG of 11-Apr-2023 11:21, Premature supraventricular complexes are now Present Confirmed by Marlana Silvan ([id number]) on 01/16/2024 3:32:28 PM  - no  acute changes.   Lab Results  Component Value Date   WBC 5.6 02/11/2020   HGB 13.4 02/11/2020   HCT 41.9 02/11/2020   MCV 92.5 02/11/2020   PLT 160 02/11/2020   Lab Results  Component Value Date   CREATININE 1.31 (H) 04/11/2023   BUN 33 (H) 04/11/2023   NA 140 04/11/2023   K 4.8 04/11/2023   CL 102 04/11/2023   CO2 24 04/11/2023   Lab Results  Component Value Date   ALT 37 04/11/2023   AST 44 (H) 04/11/2023   ALKPHOS 80 04/11/2023   BILITOT 0.7 04/11/2023   Lab Results  Component Value Date   CHOL 135 04/11/2023   HDL 65 04/11/2023   LDLCALC 58 04/11/2023   LDLDIRECT 168.1 12/15/2012   TRIG 54 04/11/2023   CHOLHDL 2.1 04/11/2023    Lab Results  Component Value Date   HGBA1C 5.8 (H) 03/31/2018    Assessment & Plan   1. Hypertension/hypotension: No longer on antihypertensive medication given recent symptomatic hypotension.  Continue to monitor BP, goal BP less than 140/80, SBP greater than 100 mmHg.    2. CAD: S/p CABG in 1991, BMS-SVG in 2008. Stable with no anginal symptoms. No indication for ischemic evaluation.  Continue Aspirin , Lipitor, Zetia .  3. Thoracic aortic aneurysm: CT chest aorta in 06/2023 showed stable dilation of the aortic root measuring 4.1 cm.  Repeat CT chest aorta was recommended in 2 years.  Continue to monitor BP as above.  Will plan for repeat CT chest/aorta in 2026 if patient is agreeable.   4. Mitral valve regurgitation: Mild on echocardiogram in July 2021.  We discussed possible repeat echocardiogram, he declines at this time.  Euvolemic and well compensated on exam.  Consider repeat echocardiogram as clinically indicated.  5. Hyperlipidemia: LDL was 58 in 03/2023.  Continue Lipitor, Zetia .  6. History of CVA: He does have a history of PFO.  Continue aspirin , Lipitor, Zetia .  7. Hypothyroidism: TSH was elevated in January at 111.9.  He was started on levothyroxine.  He thinks that this may have affected his blood pressure.  Following with  endocrinology.  8. Disposition: Follow-up in 6 months, sooner if needed.      Jude Norton, NP 01/16/2024, 3:58 PM

## 2024-01-16 ENCOUNTER — Encounter: Payer: Self-pay | Admitting: Nurse Practitioner

## 2024-01-16 ENCOUNTER — Ambulatory Visit: Attending: Nurse Practitioner | Admitting: Nurse Practitioner

## 2024-01-16 VITALS — BP 108/62 | HR 65 | Ht 69.0 in | Wt 165.2 lb

## 2024-01-16 DIAGNOSIS — I10 Essential (primary) hypertension: Secondary | ICD-10-CM

## 2024-01-16 DIAGNOSIS — Z8673 Personal history of transient ischemic attack (TIA), and cerebral infarction without residual deficits: Secondary | ICD-10-CM | POA: Diagnosis not present

## 2024-01-16 DIAGNOSIS — I2510 Atherosclerotic heart disease of native coronary artery without angina pectoris: Secondary | ICD-10-CM | POA: Diagnosis not present

## 2024-01-16 DIAGNOSIS — I340 Nonrheumatic mitral (valve) insufficiency: Secondary | ICD-10-CM | POA: Diagnosis not present

## 2024-01-16 DIAGNOSIS — I712 Thoracic aortic aneurysm, without rupture, unspecified: Secondary | ICD-10-CM | POA: Diagnosis not present

## 2024-01-16 DIAGNOSIS — E785 Hyperlipidemia, unspecified: Secondary | ICD-10-CM

## 2024-01-16 MED ORDER — EZETIMIBE 10 MG PO TABS: 10.0000 mg | ORAL_TABLET | Freq: Every day | ORAL | 3 refills | Status: DC

## 2024-01-16 NOTE — Patient Instructions (Signed)
 Medication Instructions:  Your physician recommends that you continue on your current medications as directed. Please refer to the Current Medication list given to you today.  *If you need a refill on your cardiac medications before your next appointment, please call your pharmacy*  Lab Work: NONE ordered at this time of appointment   Testing/Procedures: NONE ordered at this time of appointment   Follow-Up: At [facility], you and your health needs are our priority.  As part of our continuing mission to provide you with exceptional heart care, our providers are all part of one team.  This team includes your primary Cardiologist (physician) and Advanced Practice Providers or APPs (Physician Assistants and Nurse Practitioners) who all work together to provide you with the care you need, when you need it.  Your next appointment:   6 month(s)  Provider:   Alexandria Angel, MD    We recommend signing up for the patient portal called "MyChart".  Sign up information is provided on this After Visit Summary.  MyChart is used to connect with patients for Virtual Visits (Telemedicine).  Patients are able to view lab/test results, encounter notes, upcoming appointments, etc.  Non-urgent messages can be sent to your provider as well.   To learn more about what you can do with MyChart, go to [email or web address].

## 2024-01-18 ENCOUNTER — Encounter: Payer: Self-pay | Admitting: Nurse Practitioner

## 2024-01-20 DIAGNOSIS — L570 Actinic keratosis: Secondary | ICD-10-CM | POA: Diagnosis not present

## 2024-01-20 DIAGNOSIS — D485 Neoplasm of uncertain behavior of skin: Secondary | ICD-10-CM | POA: Diagnosis not present

## 2024-01-20 DIAGNOSIS — L821 Other seborrheic keratosis: Secondary | ICD-10-CM | POA: Diagnosis not present

## 2024-01-20 DIAGNOSIS — Z4802 Encounter for removal of sutures: Secondary | ICD-10-CM | POA: Diagnosis not present

## 2024-01-27 DIAGNOSIS — C44229 Squamous cell carcinoma of skin of left ear and external auricular canal: Secondary | ICD-10-CM | POA: Diagnosis not present

## 2024-02-12 DIAGNOSIS — N183 Chronic kidney disease, stage 3 unspecified: Secondary | ICD-10-CM | POA: Diagnosis not present

## 2024-02-12 DIAGNOSIS — I10 Essential (primary) hypertension: Secondary | ICD-10-CM | POA: Diagnosis not present

## 2024-02-15 DIAGNOSIS — E7800 Pure hypercholesterolemia, unspecified: Secondary | ICD-10-CM | POA: Diagnosis not present

## 2024-02-15 DIAGNOSIS — I10 Essential (primary) hypertension: Secondary | ICD-10-CM | POA: Diagnosis not present

## 2024-02-15 DIAGNOSIS — N183 Chronic kidney disease, stage 3 unspecified: Secondary | ICD-10-CM | POA: Diagnosis not present

## 2024-02-15 DIAGNOSIS — Z8546 Personal history of malignant neoplasm of prostate: Secondary | ICD-10-CM | POA: Diagnosis not present

## 2024-02-25 DIAGNOSIS — M25552 Pain in left hip: Secondary | ICD-10-CM | POA: Diagnosis not present

## 2024-02-26 DIAGNOSIS — R7989 Other specified abnormal findings of blood chemistry: Secondary | ICD-10-CM | POA: Diagnosis not present

## 2024-02-26 DIAGNOSIS — R946 Abnormal results of thyroid function studies: Secondary | ICD-10-CM | POA: Diagnosis not present

## 2024-03-31 DIAGNOSIS — M6281 Muscle weakness (generalized): Secondary | ICD-10-CM | POA: Diagnosis not present

## 2024-03-31 DIAGNOSIS — Z9181 History of falling: Secondary | ICD-10-CM | POA: Diagnosis not present

## 2024-03-31 DIAGNOSIS — R262 Difficulty in walking, not elsewhere classified: Secondary | ICD-10-CM | POA: Diagnosis not present

## 2024-04-07 DIAGNOSIS — R262 Difficulty in walking, not elsewhere classified: Secondary | ICD-10-CM | POA: Diagnosis not present

## 2024-04-07 DIAGNOSIS — Z9181 History of falling: Secondary | ICD-10-CM | POA: Diagnosis not present

## 2024-04-07 DIAGNOSIS — S40012A Contusion of left shoulder, initial encounter: Secondary | ICD-10-CM | POA: Diagnosis not present

## 2024-04-07 DIAGNOSIS — M6281 Muscle weakness (generalized): Secondary | ICD-10-CM | POA: Diagnosis not present

## 2024-04-09 DIAGNOSIS — R262 Difficulty in walking, not elsewhere classified: Secondary | ICD-10-CM | POA: Diagnosis not present

## 2024-04-09 DIAGNOSIS — Z9181 History of falling: Secondary | ICD-10-CM | POA: Diagnosis not present

## 2024-04-14 DIAGNOSIS — Z9181 History of falling: Secondary | ICD-10-CM | POA: Diagnosis not present

## 2024-04-14 DIAGNOSIS — M6281 Muscle weakness (generalized): Secondary | ICD-10-CM | POA: Diagnosis not present

## 2024-04-14 DIAGNOSIS — R269 Unspecified abnormalities of gait and mobility: Secondary | ICD-10-CM | POA: Diagnosis not present

## 2024-04-14 DIAGNOSIS — Z8546 Personal history of malignant neoplasm of prostate: Secondary | ICD-10-CM | POA: Diagnosis not present

## 2024-04-14 DIAGNOSIS — R7303 Prediabetes: Secondary | ICD-10-CM | POA: Diagnosis not present

## 2024-04-14 DIAGNOSIS — N3946 Mixed incontinence: Secondary | ICD-10-CM | POA: Diagnosis not present

## 2024-04-14 DIAGNOSIS — R262 Difficulty in walking, not elsewhere classified: Secondary | ICD-10-CM | POA: Diagnosis not present

## 2024-04-14 DIAGNOSIS — N183 Chronic kidney disease, stage 3 unspecified: Secondary | ICD-10-CM | POA: Diagnosis not present

## 2024-04-15 ENCOUNTER — Other Ambulatory Visit: Payer: Self-pay | Admitting: Family Medicine

## 2024-04-15 DIAGNOSIS — R269 Unspecified abnormalities of gait and mobility: Secondary | ICD-10-CM

## 2024-04-16 DIAGNOSIS — Z9181 History of falling: Secondary | ICD-10-CM | POA: Diagnosis not present

## 2024-04-16 DIAGNOSIS — M6281 Muscle weakness (generalized): Secondary | ICD-10-CM | POA: Diagnosis not present

## 2024-04-16 DIAGNOSIS — E7800 Pure hypercholesterolemia, unspecified: Secondary | ICD-10-CM | POA: Diagnosis not present

## 2024-04-16 DIAGNOSIS — I10 Essential (primary) hypertension: Secondary | ICD-10-CM | POA: Diagnosis not present

## 2024-04-16 DIAGNOSIS — Z8546 Personal history of malignant neoplasm of prostate: Secondary | ICD-10-CM | POA: Diagnosis not present

## 2024-04-16 DIAGNOSIS — R262 Difficulty in walking, not elsewhere classified: Secondary | ICD-10-CM | POA: Diagnosis not present

## 2024-04-16 DIAGNOSIS — N183 Chronic kidney disease, stage 3 unspecified: Secondary | ICD-10-CM | POA: Diagnosis not present

## 2024-04-20 DIAGNOSIS — Z9181 History of falling: Secondary | ICD-10-CM | POA: Diagnosis not present

## 2024-04-20 DIAGNOSIS — R262 Difficulty in walking, not elsewhere classified: Secondary | ICD-10-CM | POA: Diagnosis not present

## 2024-04-20 DIAGNOSIS — M6281 Muscle weakness (generalized): Secondary | ICD-10-CM | POA: Diagnosis not present

## 2024-04-24 DIAGNOSIS — R262 Difficulty in walking, not elsewhere classified: Secondary | ICD-10-CM | POA: Diagnosis not present

## 2024-04-24 DIAGNOSIS — M6281 Muscle weakness (generalized): Secondary | ICD-10-CM | POA: Diagnosis not present

## 2024-04-24 DIAGNOSIS — Z9181 History of falling: Secondary | ICD-10-CM | POA: Diagnosis not present

## 2024-04-27 ENCOUNTER — Ambulatory Visit
Admission: RE | Admit: 2024-04-27 | Discharge: 2024-04-27 | Disposition: A | Discharge status: Home / Self Care | Source: Ambulatory Visit | Attending: Family Medicine | Admitting: Family Medicine

## 2024-04-27 DIAGNOSIS — Z9181 History of falling: Secondary | ICD-10-CM | POA: Diagnosis not present

## 2024-04-27 DIAGNOSIS — R262 Difficulty in walking, not elsewhere classified: Secondary | ICD-10-CM | POA: Diagnosis not present

## 2024-04-27 DIAGNOSIS — R269 Unspecified abnormalities of gait and mobility: Secondary | ICD-10-CM

## 2024-04-27 DIAGNOSIS — M6281 Muscle weakness (generalized): Secondary | ICD-10-CM | POA: Diagnosis not present

## 2024-05-01 DIAGNOSIS — Z9181 History of falling: Secondary | ICD-10-CM | POA: Diagnosis not present

## 2024-05-01 DIAGNOSIS — M6281 Muscle weakness (generalized): Secondary | ICD-10-CM | POA: Diagnosis not present

## 2024-05-01 DIAGNOSIS — R262 Difficulty in walking, not elsewhere classified: Secondary | ICD-10-CM | POA: Diagnosis not present

## 2024-05-06 DIAGNOSIS — M6281 Muscle weakness (generalized): Secondary | ICD-10-CM | POA: Diagnosis not present

## 2024-05-06 DIAGNOSIS — Z9181 History of falling: Secondary | ICD-10-CM | POA: Diagnosis not present

## 2024-05-06 DIAGNOSIS — R262 Difficulty in walking, not elsewhere classified: Secondary | ICD-10-CM | POA: Diagnosis not present

## 2024-05-08 DIAGNOSIS — M6281 Muscle weakness (generalized): Secondary | ICD-10-CM | POA: Diagnosis not present

## 2024-05-08 DIAGNOSIS — R262 Difficulty in walking, not elsewhere classified: Secondary | ICD-10-CM | POA: Diagnosis not present

## 2024-05-08 DIAGNOSIS — Z9181 History of falling: Secondary | ICD-10-CM | POA: Diagnosis not present

## 2024-05-11 DIAGNOSIS — R262 Difficulty in walking, not elsewhere classified: Secondary | ICD-10-CM | POA: Diagnosis not present

## 2024-05-11 DIAGNOSIS — M6281 Muscle weakness (generalized): Secondary | ICD-10-CM | POA: Diagnosis not present

## 2024-05-11 DIAGNOSIS — Z9181 History of falling: Secondary | ICD-10-CM | POA: Diagnosis not present

## 2024-05-14 DIAGNOSIS — M6281 Muscle weakness (generalized): Secondary | ICD-10-CM | POA: Diagnosis not present

## 2024-05-14 DIAGNOSIS — R262 Difficulty in walking, not elsewhere classified: Secondary | ICD-10-CM | POA: Diagnosis not present

## 2024-05-14 DIAGNOSIS — Z9181 History of falling: Secondary | ICD-10-CM | POA: Diagnosis not present

## 2024-05-17 DIAGNOSIS — E7800 Pure hypercholesterolemia, unspecified: Secondary | ICD-10-CM | POA: Diagnosis not present

## 2024-05-17 DIAGNOSIS — I10 Essential (primary) hypertension: Secondary | ICD-10-CM | POA: Diagnosis not present

## 2024-05-17 DIAGNOSIS — Z8546 Personal history of malignant neoplasm of prostate: Secondary | ICD-10-CM | POA: Diagnosis not present

## 2024-05-17 DIAGNOSIS — N183 Chronic kidney disease, stage 3 unspecified: Secondary | ICD-10-CM | POA: Diagnosis not present

## 2024-06-01 ENCOUNTER — Encounter: Payer: Self-pay | Admitting: Neurology

## 2024-06-01 ENCOUNTER — Ambulatory Visit (INDEPENDENT_AMBULATORY_CARE_PROVIDER_SITE_OTHER): Admitting: Neurology

## 2024-06-01 VITALS — BP 150/86 | HR 64 | Ht 67.0 in | Wt 163.0 lb

## 2024-06-01 DIAGNOSIS — K117 Disturbances of salivary secretion: Secondary | ICD-10-CM

## 2024-06-01 DIAGNOSIS — Z8673 Personal history of transient ischemic attack (TIA), and cerebral infarction without residual deficits: Secondary | ICD-10-CM

## 2024-06-01 DIAGNOSIS — E039 Hypothyroidism, unspecified: Secondary | ICD-10-CM

## 2024-06-01 DIAGNOSIS — R4189 Other symptoms and signs involving cognitive functions and awareness: Secondary | ICD-10-CM

## 2024-06-01 NOTE — Progress Notes (Signed)
 [facility]  PATIENT: Jason Noble DOB: 13-May-1941  REQUESTING CLINICIAN: Arloa Elsie SAUNDERS, MD HISTORY FROM: Patient and spouse  REASON FOR VISIT: Strokes on MRI/Falls    HISTORICAL  CHIEF COMPLAINT:  Chief Complaint  Patient presents with   New Patient (Initial Visit)    RM 12 gait abnormality, stroke    HISTORY OF PRESENT ILLNESS:  Discussed the use of AI scribe software for clinical note transcription with the patient, who gave verbal consent to proceed.  Jason Noble is an 83 year old male with coronary artery disease and prior stroke who presents with cognitive changes and gait disturbances. He is accompanied by his wife, who provides additional information about his condition. He was referred by Dr. Arloa after evaluation of possible normal pressure hydrocephalus showed chronic strokes in MRI.  He has been experiencing cognitive changes, described as 'fogginess' and forgetfulness, which have been progressively worsening. He has difficulty with memory, such as forgetting to close doors, and sometimes leaves the car door open. He also needs to wear diapers at night due to increased nighttime urination, which began approximately two months after a fall in February.  In February, he experienced a fall while shopping, which did not result in any head injury or fractures. Since the fall, he has noticed a decline in his walking ability, now requiring a walker instead of a cane. He has been undergoing physical therapy to address his gait issues.  He has a history of coronary artery disease, having undergone a triple bypass surgery, and a prior stroke in 2008 or 2009. He also has a history of throat cancer, for which he received radiation therapy, affecting his saliva production and requiring him to drink fluids frequently during meals.  He is currently taking Lipitor, Zetia , a half dose of losartan , and a baby aspirin . He was previously on  Synthroid for thyroid  issues, which he stopped a couple of weeks ago. His wife has concerns about the potential cognitive effects of long-term Lipitor use.  He also has a history of prostate removal and gallbladder removal. He experiences increased nighttime urination, which he attributes to his need to drink more fluids due to dry mouth from reduced saliva production.    OTHER MEDICAL CONDITIONS: Recent fall, gait abnormality, Hypertension, Hyperlipidemia, Cognitive impairment   REVIEW OF SYSTEMS: Full 14 system review of systems performed and negative with exception of: As noted in the HPI  ALLERGIES: No Known Allergies  HOME MEDICATIONS: Outpatient Medications Prior to Visit  Medication Sig Dispense Refill   Coenzyme Q10 (CO Q 10 PO) Take 200 mg by mouth daily.     LECITHIN CONCENTRATE PO Take by mouth. 2 times per week     Multiple Vitamin (MULTIVITAMIN) capsule Take 1 capsule by mouth daily.     Omega-3 Fatty Acids (OMEGA 3 PO) Take by mouth. 1 per day     OVER THE COUNTER MEDICATION Take 20 mg by mouth daily. Lutigold     OVER THE COUNTER MEDICATION Take 2 capsules by mouth daily.     vitamin E 180 MG (400 UNITS) capsule Take 400 Units by mouth daily.     atorvastatin  (LIPITOR) 80 MG tablet Take 1 tablet (80 mg total) by mouth daily. (Patient not taking: Reported on 06/01/2024) 90 tablet 3   ezetimibe  (ZETIA ) 10 MG tablet Take 1 tablet (10 mg total) by mouth daily. 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    Basal cell carcinoma of skin    Broken jaw (HCC)    right lower    Cholecystitis    Coronary heart disease    has  a stent   CVA (cerebral infarction) 2007   mild   Fatigue 07/26/2011   History of cancer chemotherapy    History of radiation therapy 06/18/2011- 08/03/2011   squamous cell tongue/69.96 Gy 72fx   Hypercholesteremia    under control   Hypertension    Myocardial infarction (HCC)    approx 2008 or 2009   Numbness    right lower    Numbness and tingling in left hand    numbess 2 fingers    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   Peripheral vascular disease (HCC)    PFO (patent foramen ovale)    Prostate cancer (HCC) 2002   Skin cancer    basal cell, squamous cell   Squamous cell carcinoma of skin    Stroke (HCC)    appro in 08 or 09    PAST SURGICAL HISTORY: Past Surgical History:  Procedure Laterality Date   CATARACT EXTRACTION W/ INTRAOCULAR LENS  IMPLANT, BILATERAL     CHOLECYSTECTOMY  04/2009   3 surgeries involved: lap chole x2 laparotomy   CORONARY ARTERY BYPASS GRAFT  1991   triple   DIRECT LARYNGOSCOPY N/A 02/17/2020   Procedure: DIRECT LARYNGOSCOPY WITH BIOPSY;  Surgeon: Jesus Oliphant, MD;  Location: [facility] OR;  Service: ENT;  Laterality: N/A;   ELBOW SURGERY  42 years ago   removal left radial head due to hockey accident   EXPLORATORY LAPAROTOMY  04/2010   Dr. Tanda (3rd surgery for gall bladder)   INGUINAL HERNIA REPAIR Right 09/23/2014   Procedure: RIGHT INGUINAL HERNIA REPAIR WITH MESH;  Surgeon: Krystal Russell, MD;  Location: [facility]S;  Service: General;  Laterality: Right;   INGUINAL HERNIA REPAIR Left 09/30/2015   Procedure: HERNIA REPAIR LEFT INGUINAL ADULT;  Surgeon: Krystal Russell, MD;  Location: [facility];  Service: General;  Laterality: Left;   INSERTION OF MESH Left 09/30/2015   Procedure: INSERTION OF MESH;  Surgeon: Krystal Russell, MD;  Location: [facility];  Service: General;  Laterality: Left;   MULTIPLE TOOTH EXTRACTIONS     prior to radiation   PEG TUBE PLACEMENT  06/13/11 at [facility]   10/2011 removed   PROSTATECTOMY  2002   TONSILLECTOMY     x2: as child  then 1985 for Left lingual tonsil removed   TOTAL HIP ARTHROPLASTY Left 02/08/2016   Procedure: TOTAL HIP ARTHROPLASTY ANTERIOR APPROACH;  Surgeon: Dempsey Sensor, MD;  Location: [facility] OR;  Service: Orthopedics;  Laterality: Left;   ULNAR NERVE TRANSPOSITION Left 10/16/2018   Procedure: ULNAR NERVE DECOMPRESSION/TRANSPOSITION;  Surgeon: Dozier Soulier, MD;  Location: [facility] OR;  Service: Orthopedics;  Laterality: Left;    FAMILY HISTORY: Family History  Problem Relation Age of Onset   Stroke Mother    Coronary artery disease Brother        had bypass    SOCIAL HISTORY: Social History   Socioeconomic History   Marital status: Married    Spouse name: Not  on file   Number of children: Not on file   Years of education: Not on file   Highest education level: Not on file  Occupational History   Not on file  Tobacco Use   Smoking status: Former    Current packs/day: 0.00    Average packs/day: 0.3 packs/day for 5.0 years (1.3 ttl pk-yrs)    Types: Cigarettes    Start date: 03/20/1961    Quit date: 03/20/1966    Years since quitting: 58.2   Smokeless tobacco: Never  Vaping Use   Vaping status: Never Used  Substance and Sexual Activity   Alcohol use: Yes    Alcohol/week: 1.0 standard drink of alcohol    Types: 1 Glasses of wine per week   Drug use: No   Sexual activity: Not on file  Other Topics Concern   Not on file  Social History Narrative   Right handed   Social Drivers of Health   Financial Resource Strain: Not on file  Food Insecurity: Not on file  Transportation Needs: Not on file  Physical Activity: Not on file  Stress: Not on file  Social Connections: Not on file  Intimate Partner Violence: Not on file    PHYSICAL EXAM  GENERAL EXAM/CONSTITUTIONAL: Vitals:  Vitals:   06/01/24 0848  BP: (!) 150/86  Pulse: 64  SpO2: 96%  Weight: 163 lb (73.9 kg)  Height: 5' 7 (1.702 m)   Body mass index is 25.53 kg/m. Wt Readings from Last 3 Encounters:  06/01/24 163 lb (73.9  kg)  01/16/24 165 lb 3.2 oz (74.9 kg)  04/11/23 172 lb (78 kg)   Patient is in no distress; well developed, nourished and groomed; neck is supple  MUSCULOSKELETAL: Gait, strength, tone, movements noted in Neurologic exam below  NEUROLOGIC: MENTAL STATUS:      No data to display         awake, alert, oriented to person, place and time recent and remote memory intact normal attention and concentration language fluent, comprehension intact, naming intact fund of knowledge appropriate  CRANIAL NERVE:  2nd, 3rd, 4th, 6th - Visual fields full to confrontation, extraocular muscles intact, no nystagmus 5th - facial sensation symmetric 7th - facial strength symmetric 8th - hearing intact 9th - palate elevates symmetrically, uvula midline 11th - shoulder shrug symmetric 12th - tongue protrusion midline  MOTOR:  normal bulk and tone, full strength in the BUE, BLE  SENSORY:  normal and symmetric to light touch  COORDINATION:  finger-nose-finger, fine finger movements normal  GAIT/STATION:  Ambulate with a walker    DIAGNOSTIC DATA (LABS, IMAGING, TESTING) - I reviewed patient records, labs, notes, testing and imaging myself where available.  Lab Results  Component Value Date   WBC 5.6 02/11/2020   HGB 13.4 02/11/2020   HCT 41.9 02/11/2020   MCV 92.5 02/11/2020   PLT 160 02/11/2020      Component Value Date/Time   NA 140 04/11/2023 1200   NA 142 04/27/2014 1500   K 4.8 04/11/2023 1200   K 4.6 04/27/2014 1500   CL 102 04/11/2023 1200   CL 106 10/22/2012 0829   CO2 24 04/11/2023 1200   CO2 27 04/27/2014 1500   GLUCOSE 96 04/11/2023 1200   GLUCOSE 169 (H) 02/11/2020 0947   GLUCOSE 82 04/27/2014 1500   GLUCOSE 101 (H) 10/22/2012 0829   BUN 33 (H) 04/11/2023 1200   BUN 20.4 04/27/2014 1500   CREATININE 1.31 (H) 04/11/2023 1200   CREATININE 1.29 (H)  12/28/2016 0923   CREATININE 1.3 04/27/2014 1500   CALCIUM  9.6 04/11/2023 1200   CALCIUM  9.5 04/27/2014 1500    PROT 6.7 04/11/2023 1200   PROT 6.2 (L) 04/27/2014 1500   ALBUMIN 4.3 04/11/2023 1200   ALBUMIN 3.6 04/27/2014 1500   AST 44 (H) 04/11/2023 1200   AST 15 04/27/2014 1500   ALT 37 04/11/2023 1200   ALT 11 04/27/2014 1500   ALKPHOS 80 04/11/2023 1200   ALKPHOS 61 04/27/2014 1500   BILITOT 0.7 04/11/2023 1200   BILITOT 0.68 04/27/2014 1500   GFRNONAA >60 02/11/2020 0947   GFRAA >60 02/11/2020 0947   Lab Results  Component Value Date   CHOL 135 04/11/2023   HDL 65 04/11/2023   LDLCALC 58 04/11/2023   LDLDIRECT 168.1 12/15/2012   TRIG 54 04/11/2023   CHOLHDL 2.1 04/11/2023   Lab Results  Component Value Date   HGBA1C 5.8 (H) 03/31/2018   No results found for: [id number] Lab Results  Component Value Date   TSH 4.840 (H) 03/31/2018    MRI Brain  04/27/2024 1. No evidence of acute intracranial abnormality. 2. Moderate chronic microvascular ischemic disease and remote cerebellar and left frontal infarcts. 3. Cerebral Atrophy  ASSESSMENT AND PLAN  83 y.o. year old male with history of gait abnormality, hypertension, hyperlipidemia who is presenting after MRI showed chronic strokes.    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 Cognitive changes include fogginess and forgetfulness, not severe enough for dementia diagnosis. Gait changes have progressed, requiring a walker. No evidence of normal pressure hydrocephalus on MRI. Atrophy is diffuse, not typical for Alzheimer's disease. Cognitive changes likely due to small vessel disease and brain atrophy. - Continue physical therapy. - Perform cognitive exercises such as word search and puzzles. - Check thyroid  function tests (T3, T4) and B12 levels.  Stroke (left cerebellar and left frontal) MRI shows old stroke in left cerebellum and left frontal region. Cerebellar stroke is chronic, with no clear  history of symptoms at occurrence. No new stroke symptoms reported. 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 He was on levothyroxine but stopped a few weeks ago. Thyroid  function needs monitoring as hypothyroidism can affect cognition. - Check thyroid  function tests (T3, T4).  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 Condition necessitates increased fluid intake, contributing to nocturia. Parotid gland enlarged, possibly due to blocked ducts or inflammation, but not painful. ENT monitoring gland, removal discussed but not pursued.  Nocturia and urinary urgency Experiences nocturia and urinary urgency, likely exacerbated by increased fluid intake due to xerostomia. Nocturia may also relate to fluid redistribution when lying down. No evidence of incontinence. Prostate removed, ruling out prostate-related causes. - Restrict fluid intake in the evening if possible.    1. Cognitive changes   2. History of stroke   3. Hypothyroidism, unspecified type   4. Xerostomia      Patient Instructions  Continue current medications  Continue with physical therapy, patient can resume physical therapy  Will check B12 and thyroid  studies.  I will contact you to go over the result No evidence of NPH on exam and also on imaging. Continue follow-up with your doctors Return as needed  Orders Placed This Encounter  Procedures   Vitamin B12   Thyroid  Panel With TSH    No orders of the defined types were placed in this encounter.   Return if symptoms worsen or fail to improve.  I have spent a total of 60 minutes dedicated to this patient today, preparing to see patient, performing a medically appropriate examination and evaluation, ordering tests and/or  medications and procedures, and counseling and educating the patient/family/caregiver; independently interpreting result and communicating results to the family/patient/caregiver; and documenting clinical information in the electronic medical record.   Pastor Falling, MD 06/01/2024, 1:15 PM  [facility] [address], [address] [place], [place] [address] [phone]

## 2024-06-01 NOTE — Patient Instructions (Addendum)
 Continue current medications  Continue with physical therapy, patient can resume physical therapy  Will check B12 and thyroid  studies.  I will contact you to go over the result No evidence of NPH on exam and also on imaging. Continue follow-up with your doctors Return as needed

## 2024-06-02 ENCOUNTER — Ambulatory Visit: Payer: Self-pay | Admitting: Neurology

## 2024-06-02 LAB — THYROID PANEL WITH TSH
Free Thyroxine Index: 0.6 — ABNORMAL LOW (ref 1.2–4.9)
T3 Uptake Ratio: 23 % — ABNORMAL LOW (ref 24–39)
T4, Total: 2.8 ug/dL — ABNORMAL LOW (ref 4.5–12.0)
TSH: 138 u[IU]/mL — ABNORMAL HIGH (ref 0.450–4.500)

## 2024-06-02 LAB — VITAMIN B12: Vitamin B-12: 920 pg/mL (ref 232–1245)

## 2024-06-02 NOTE — Progress Notes (Signed)
 Left spouse a message regarding abnormal thyroid  results.

## 2024-06-02 NOTE — Telephone Encounter (Signed)
 LVM 1ST ATTEMPT BY HF 06/02/24

## 2024-06-02 NOTE — Progress Notes (Signed)
 Please call and advise the patient that the recent thyroid  study was very abnormal and he needs to call PCP office today. Please remind patient to keep any upcoming appointments or tests and to call us  with any interim questions, concerns, problems or updates. Thanks,   Pastor Falling, MD

## 2024-06-02 NOTE — Telephone Encounter (Signed)
-----   Message from [facility] sent at 06/02/2024  8:07 AM EDT ----- Please call and advise the patient that the recent thyroid  study was very abnormal and he needs to call PCP office today. Please remind patient to keep any upcoming appointments or tests and to call us  with any interim questions, concerns, problems or updates. Thanks,   Pastor Falling, MD   ----- Message ----- From: Interface, [facility] Lab Results In Sent: 06/02/2024   5:36 AM EDT To: Pastor Falling, MD

## 2024-06-08 DIAGNOSIS — Z23 Encounter for immunization: Secondary | ICD-10-CM | POA: Diagnosis not present

## 2024-06-10 ENCOUNTER — Other Ambulatory Visit: Payer: Self-pay | Admitting: Neurology

## 2024-06-10 ENCOUNTER — Telehealth: Payer: Self-pay | Admitting: Neurology

## 2024-06-10 DIAGNOSIS — R269 Unspecified abnormalities of gait and mobility: Secondary | ICD-10-CM

## 2024-06-10 NOTE — Telephone Encounter (Signed)
 RETURNED CALL TO PTS WIFE who stated that she needs approval for PT can you place pt please send to [facility]: Address: [address] [place], [place] [address] Phone: [phone]  I stated that I would ask Dr. Gregg if willing to send order and give callback. Pt voiced gratitude and understanding

## 2024-06-10 NOTE — Telephone Encounter (Signed)
 Done. Thank you.

## 2024-06-10 NOTE — Telephone Encounter (Signed)
 Wife(on DPR)is asking to be called re: Dr Gregg placing the needed order for pt to continue his PT, wife can be reached at [phone]

## 2024-06-11 ENCOUNTER — Telehealth: Payer: Self-pay | Admitting: Neurology

## 2024-06-11 NOTE — Telephone Encounter (Signed)
 Referral for Physical Therapy faxed to [facility] Phone; [phone] Fax:[phone]

## 2024-06-16 DIAGNOSIS — E7800 Pure hypercholesterolemia, unspecified: Secondary | ICD-10-CM | POA: Diagnosis not present

## 2024-06-16 DIAGNOSIS — N183 Chronic kidney disease, stage 3 unspecified: Secondary | ICD-10-CM | POA: Diagnosis not present

## 2024-06-16 DIAGNOSIS — Z8546 Personal history of malignant neoplasm of prostate: Secondary | ICD-10-CM | POA: Diagnosis not present

## 2024-06-16 DIAGNOSIS — I10 Essential (primary) hypertension: Secondary | ICD-10-CM | POA: Diagnosis not present

## 2024-06-18 NOTE — Telephone Encounter (Signed)
 Patient's wife requesting physical therapy referral refax to [facility]. Phone: [phone], Fax: [phone] Could create another referral to be sent there?

## 2024-06-19 ENCOUNTER — Other Ambulatory Visit: Payer: Self-pay | Admitting: Cardiology

## 2024-06-22 ENCOUNTER — Other Ambulatory Visit: Payer: Self-pay | Admitting: Neurology

## 2024-06-22 DIAGNOSIS — R269 Unspecified abnormalities of gait and mobility: Secondary | ICD-10-CM

## 2024-06-22 NOTE — Telephone Encounter (Signed)
 New referral sent.

## 2024-06-23 NOTE — Telephone Encounter (Signed)
 Referral for Physical Therapy faxed to [facility] Phone: [phone] Fax:[phone]

## 2024-06-30 DIAGNOSIS — K118 Other diseases of salivary glands: Secondary | ICD-10-CM | POA: Diagnosis not present

## 2024-06-30 DIAGNOSIS — R1312 Dysphagia, oropharyngeal phase: Secondary | ICD-10-CM | POA: Diagnosis not present

## 2024-06-30 DIAGNOSIS — C321 Malignant neoplasm of supraglottis: Secondary | ICD-10-CM | POA: Diagnosis not present

## 2024-07-06 DIAGNOSIS — L578 Other skin changes due to chronic exposure to nonionizing radiation: Secondary | ICD-10-CM | POA: Diagnosis not present

## 2024-07-06 DIAGNOSIS — D485 Neoplasm of uncertain behavior of skin: Secondary | ICD-10-CM | POA: Diagnosis not present

## 2024-07-17 DIAGNOSIS — N183 Chronic kidney disease, stage 3 unspecified: Secondary | ICD-10-CM | POA: Diagnosis not present

## 2024-07-17 DIAGNOSIS — Z8546 Personal history of malignant neoplasm of prostate: Secondary | ICD-10-CM | POA: Diagnosis not present

## 2024-07-17 DIAGNOSIS — E7800 Pure hypercholesterolemia, unspecified: Secondary | ICD-10-CM | POA: Diagnosis not present

## 2024-07-17 DIAGNOSIS — I10 Essential (primary) hypertension: Secondary | ICD-10-CM | POA: Diagnosis not present

## 2024-08-03 ENCOUNTER — Ambulatory Visit: Admitting: Speech Pathology

## 2024-08-03 ENCOUNTER — Ambulatory Visit: Attending: Neurology

## 2024-08-03 DIAGNOSIS — M6281 Muscle weakness (generalized): Secondary | ICD-10-CM | POA: Insufficient documentation

## 2024-08-03 DIAGNOSIS — R2681 Unsteadiness on feet: Secondary | ICD-10-CM | POA: Diagnosis not present

## 2024-08-03 DIAGNOSIS — R296 Repeated falls: Secondary | ICD-10-CM | POA: Diagnosis not present

## 2024-08-03 DIAGNOSIS — R269 Unspecified abnormalities of gait and mobility: Secondary | ICD-10-CM | POA: Diagnosis not present

## 2024-08-03 DIAGNOSIS — R260 Ataxic gait: Secondary | ICD-10-CM | POA: Insufficient documentation

## 2024-08-03 DIAGNOSIS — R1312 Dysphagia, oropharyngeal phase: Secondary | ICD-10-CM | POA: Insufficient documentation

## 2024-08-03 NOTE — Therapy (Signed)
 OUTPATIENT PHYSICAL THERAPY NEURO EVALUATION   Patient Name: Jason Noble MRN: [id number] DOB:May 27, 1941, 83 y.o., male Today's Date: 08/03/2024   PCP: Arloa Elsie SAUNDERS, MD REFERRING PROVIDER: Gregg Lek, MD  END OF SESSION:  PT End of Session - 08/03/24 0936     Visit Number 1    Date for Recertification  10/12/24    PT Start Time 0930    PT Stop Time 1015    PT Time Calculation (min) 45 min    Activity Tolerance Patient tolerated treatment well    Behavior During Therapy [facility] for tasks assessed/performed          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    Basal cell carcinoma of skin    Broken jaw (HCC)    right lower    Cholecystitis    Coronary heart disease    has a stent   CVA (cerebral infarction) 2007   mild   Fatigue 07/26/2011   History of cancer chemotherapy    History of radiation therapy 06/18/2011- 08/03/2011   squamous cell tongue/69.96 Gy 74fx   Hypercholesteremia    under control   Hypertension    Myocardial infarction (HCC)    approx 2008 or 2009   Numbness    right lower    Numbness and tingling in left hand    numbess 2 fingers    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   Peripheral vascular disease    PFO (patent foramen ovale)    Prostate cancer (HCC) 2002   Skin cancer    basal cell, squamous cell   Squamous cell carcinoma of skin    Stroke (HCC)    appro in 08 or 09   Past Surgical History:  Procedure Laterality Date   CATARACT EXTRACTION W/ INTRAOCULAR LENS  IMPLANT, BILATERAL     CHOLECYSTECTOMY  04/2009   3 surgeries involved: lap chole x2 laparotomy   CORONARY ARTERY BYPASS GRAFT  1991   triple   DIRECT LARYNGOSCOPY N/A 02/17/2020   Procedure: DIRECT LARYNGOSCOPY WITH BIOPSY;  Surgeon: Jesus Oliphant, MD;  Location: [facility] OR;  Service: ENT;  Laterality: N/A;   ELBOW SURGERY  42 years ago   removal left radial head due to hockey accident    EXPLORATORY LAPAROTOMY  04/2010   Dr. Tanda (3rd surgery for gall bladder)   INGUINAL HERNIA REPAIR Right 09/23/2014   Procedure: RIGHT INGUINAL HERNIA REPAIR WITH MESH;  Surgeon: Krystal Russell, MD;  Location: [facility] ORS;  Service: General;  Laterality: Right;   INGUINAL HERNIA REPAIR Left 09/30/2015   Procedure: HERNIA REPAIR LEFT INGUINAL ADULT;  Surgeon: Krystal Russell, MD;  Location: [facility];  Service: General;  Laterality: Left;   INSERTION OF MESH Left 09/30/2015   Procedure: INSERTION OF MESH;  Surgeon: Krystal Russell, MD;  Location: [facility];  Service: General;  Laterality: Left;   MULTIPLE TOOTH EXTRACTIONS     prior to radiation   PEG TUBE PLACEMENT  06/13/11 at [facility] IR   10/2011 removed   PROSTATECTOMY  2002   TONSILLECTOMY     x2: as child then 1985 for Left lingual tonsil removed   TOTAL HIP ARTHROPLASTY Left 02/08/2016   Procedure: TOTAL HIP ARTHROPLASTY ANTERIOR APPROACH;  Surgeon: Dempsey Sensor, MD;  Location[facility] OR;  Service: Orthopedics;  Laterality: Left;   ULNAR NERVE TRANSPOSITION Left 10/16/2018   Procedure: ULNAR NERVE DECOMPRESSION/TRANSPOSITION;  Surgeon: Dozier Soulier, MD;  Location: [facility] OR;  Service: Orthopedics;  Laterality: Left;   Patient Active Problem List   Diagnosis Date Noted   Pulmonary embolism (HCC) 05/17/2020   Anticoagulated 05/17/2020   Cancer, 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   Ulnar neuritis, left 10/16/2018   Primary osteoarthritis of left hip 02/08/2016   Pre-operative cardiovascular examination 01/25/2016   Dilated aortic root 01/25/2016   Essential hypertension 01/25/2016   Hearing deficit 04/27/2014   Dry mouth 04/27/2014   Malignant neoplasm of base of tongue (HCC) 07/27/2011   Fatigue 07/26/2011   Aneurysm of thoracic aorta 09/05/2010   PATENT FORAMEN OVALE 11/28/2009   MYOCARDIAL INFARCTION, HX OF 11/24/2009   Hx of CABG 11/24/2009   PROSTATE CANCER, HX OF 11/24/2009   CEREBROVASCULAR ACCIDENT, HX OF 11/24/2009   Dyslipidemia 11/23/2009    MALAISE AND FATIGUE 11/23/2009   CHOLECYSTECTOMY, HX OF 11/23/2009    ONSET DATE: 06/30/24  REFERRING DIAG: R26.9 - Abnormal gait   THERAPY DIAG:  Ataxic gait  Gait instability  Repeated falls  Muscle weakness (generalized)  Unsteadiness on feet  Rationale for Evaluation and Treatment: Rehabilitation  SUBJECTIVE:                                                                                                                                                                                             SUBJECTIVE STATEMENT: Pt reported having multiple falls over the last six months, around 4 falls. Pt stated he would like to get stronger in PT. Pt reported multiple health issues he has dealt with and recent health issues he is dealing with, including history of CA.  Pt accompanied by: self  PERTINENT HISTORY: See PMH chart above   PAIN:  Are you having pain? No  PRECAUTIONS: None  RED FLAGS: None   WEIGHT BEARING RESTRICTIONS: No  FALLS: Has patient fallen in last 6 months? Yes. Number of falls 4  LIVING ENVIRONMENT: Lives with: lives with their spouse Lives in: House/apartment Stairs: No Has following equipment at home: Single point cane and Walker - 2 wheeled  PLOF: Independent with basic ADLs and Independent with household mobility with device  PATIENT GOALS: To increase strength   OBJECTIVE:  Note: Objective measures were completed at Evaluation unless otherwise noted.  DIAGNOSTIC FINDINGS: Abnormal thyroid  results per Jason Falling, MD   COGNITION: Overall cognitive status: No family/caregiver present to determine baseline cognitive functioning; remained [facility] with tasks; requires redirection.    SENSATION: WFL   POSTURE: rounded shoulders and anterior pelvic tilt  LOWER EXTREMITY ROM:     WNL  LOWER EXTREMITY MMT:    MMT Right  Eval Left Eval  Hip flexion 4/5 3+/5  Hip extension    Hip abduction 5/5 5/5  Hip adduction 5/5 5/5  Hip internal  rotation    Hip external rotation    Knee flexion    Knee extension 4/5 4/5  Ankle dorsiflexion    Ankle plantarflexion    Ankle inversion    Ankle eversion    (Blank rows = not tested)  TRANSFERS: Sit to stand: Modified independence  Assistive device utilized: Environmental Consultant - 2 wheeled     Chair to chair: Modified independence, SBA, and CGA  Assistive device utilized: Environmental Consultant - 2 wheeled        GAIT: Findings: Distance walked: in clinic and Comments: Pt exhibits decreased hip and knee flexion bilaterally resulting in decreased foot clearance. Pt with decreased step length and decreased cadence. Pt foot width decreased resulting in narrow BOS.   FUNCTIONAL TESTS:  5 times sit to stand: 32s UE use  TUG: 27s 4 stage balance: feet together- 2s , half tandem- Unable to do, full tandem- Unable to do, SL stance- Not attempted                                                                                                                               TREATMENT DATE:  08/03/24- Eval    PATIENT EDUCATION: Education details: HEP Person educated: Patient Education method: Medical Illustrator Education comprehension: verbalized understanding and returned demonstration  HOME EXERCISE PROGRAM: Access Code: [email or web address]  Date: 08/03/2024  Exercises - Standing March with Counter Support  - 1 x daily - 7 x weekly - 3 sets - 10 reps - Seated Long Arc Quad  - 1 x daily - 7 x weekly - 3 sets - 10 reps - Sit to Stand  - 1 x daily - 7 x weekly - 2 sets - 10 reps   GOALS: Goals reviewed with patient? Yes  SHORT TERM GOALS: Target date: 09/07/24  Patient will be independent with initial HEP. Baseline:  Goal status: INITIAL  2. Patient will demonstrate decreased fall risk by scoring < 20 sec on TUG. Baseline:  Goal status: INITIAL  3. Patient will be educated on strategies to  decrease risk of falls.  Baseline:  Goal status: INITIAL   LONG TERM GOALS: Target date: 10/13/23  Pt will  perform 5xSTS in <25s  Baseline:  Goal status: INITIAL  2.  Patient will be independent with advanced/ongoing HEP to improve outcomes and carryover.  Baseline:  Goal status: INITIAL  3. Patient will demonstrate improved functional LE strength as demonstrated by 5/5 strength in BLEs. Baseline:  Goal status: INITIAL  4.  Pt will perform 4 stage balance as follows: feet together >10s, half tandem >5s, tandem stance with UE support >10s  Baseline:  Goal status: INITIAL  ASSESSMENT:  CLINICAL IMPRESSION: Patient is a 83 y.o. male who was seen today for physical therapy evaluation and treatment for gait instability and  repeated falls. Pt was tangential and it was difficult to keep him on task during session; requires cues for redirection. Pt exhibit abnormalities in gait which has resulted in repeated falls. Gait mechanics noted above; foot clearance most notable deficit. Pt exhibits decreased strength and muscle imbalances in BLEs. Pt will benefit from PT to increase strength, increase quality of gait, and improve ability to perform functional tasks safely.   OBJECTIVE IMPAIRMENTS: Abnormal gait, decreased activity tolerance, decreased balance, decreased endurance, decreased mobility, difficulty walking, decreased strength, increased muscle spasms, and improper body mechanics.   ACTIVITY LIMITATIONS: carrying, lifting, and bending  PARTICIPATION LIMITATIONS: driving  PERSONAL FACTORS: Age and Time since onset of injury/illness/exacerbation are also affecting patient's functional outcome.   REHAB POTENTIAL: Good  CLINICAL DECISION MAKING: Stable/uncomplicated  EVALUATION COMPLEXITY: Low  PLAN:  PT FREQUENCY: 1-2x/week  PT DURATION: 10 weeks  PLANNED INTERVENTIONS: 97110-Therapeutic exercises, 97530- Therapeutic activity, [id number]- Neuromuscular re-education, 97535- Self Care, [id number]- Manual therapy, [phone]- Gait training, and Patient/Family education  PLAN FOR NEXT SESSION: Static and  dynamic balance activities, LE strengthening, functional activities   Thersia Alder, Student-PT 08/03/2024, 11:07 AM

## 2024-08-05 ENCOUNTER — Encounter: Payer: Self-pay | Admitting: Speech Pathology

## 2024-08-05 ENCOUNTER — Ambulatory Visit: Admitting: Speech Pathology

## 2024-08-05 DIAGNOSIS — R1312 Dysphagia, oropharyngeal phase: Secondary | ICD-10-CM | POA: Diagnosis not present

## 2024-08-05 DIAGNOSIS — R269 Unspecified abnormalities of gait and mobility: Secondary | ICD-10-CM | POA: Diagnosis not present

## 2024-08-05 DIAGNOSIS — M6281 Muscle weakness (generalized): Secondary | ICD-10-CM | POA: Diagnosis not present

## 2024-08-05 DIAGNOSIS — R260 Ataxic gait: Secondary | ICD-10-CM | POA: Diagnosis not present

## 2024-08-05 DIAGNOSIS — R2681 Unsteadiness on feet: Secondary | ICD-10-CM | POA: Diagnosis not present

## 2024-08-05 DIAGNOSIS — R296 Repeated falls: Secondary | ICD-10-CM | POA: Diagnosis not present

## 2024-08-05 NOTE — Therapy (Signed)
 OUTPATIENT SPEECH LANGUAGE PATHOLOGY SWALLOW EVALUATION   Patient Name: Jason Noble MRN: [id number] DOB:January 10, 1941, 83 y.o., male Today's Date: 08/05/2024  PCP: Jason Elsie SAUNDERS, MD REFERRING PROVIDER: Lauralee Chew, MD  END OF SESSION:  End of Session - 08/05/24 1225     Visit Number 1    Number of Visits 7    Date for Recertification  10/05/24    SLP Start Time 1230    SLP Stop Time  1310    SLP Time Calculation (min) 40 min          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    Basal cell carcinoma of skin    Broken jaw (HCC)    right lower    Cholecystitis    Coronary heart disease    has a stent   CVA (cerebral infarction) 2007   mild   Fatigue 07/26/2011   History of cancer chemotherapy    History of radiation therapy 06/18/2011- 08/03/2011   squamous cell tongue/69.96 Gy 47fx   Hypercholesteremia    under control   Hypertension    Myocardial infarction (HCC)    approx 2008 or 2009   Numbness    right lower    Numbness and tingling in left hand    numbess 2 fingers    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   Peripheral vascular disease    PFO (patent foramen ovale)    Prostate cancer (HCC) 2002   Skin cancer    basal cell, squamous cell   Squamous cell carcinoma of skin    Stroke (HCC)    appro in 08 or 09   Past Surgical History:  Procedure Laterality Date   CATARACT EXTRACTION W/ INTRAOCULAR LENS  IMPLANT, BILATERAL     CHOLECYSTECTOMY  04/2009   3 surgeries involved: lap chole x2 laparotomy   CORONARY ARTERY BYPASS GRAFT  1991   triple   DIRECT LARYNGOSCOPY N/A 02/17/2020   Procedure: DIRECT LARYNGOSCOPY WITH BIOPSY;  Surgeon: Jason Oliphant, MD;  Location: [facility] OR;  Service: ENT;  Laterality: N/A;   ELBOW SURGERY  42 years ago   removal left radial head due to hockey accident   EXPLORATORY LAPAROTOMY  04/2010   Dr. Tanda (3rd surgery for gall bladder)   INGUINAL  HERNIA REPAIR Right 09/23/2014   Procedure: RIGHT INGUINAL HERNIA REPAIR WITH MESH;  Surgeon: Jason Russell, MD;  Location: [facility];  Service: General;  Laterality: Right;   INGUINAL HERNIA REPAIR Left 09/30/2015   Procedure: HERNIA REPAIR LEFT INGUINAL ADULT;  Surgeon: Jason Russell, MD;  Location: [facility];  Service: General;  Laterality: Left;   INSERTION OF MESH Left 09/30/2015   Procedure: INSERTION OF MESH;  Surgeon: Jason Russell, MD;  Location: [facility];  Service: General;  Laterality: Left;   MULTIPLE TOOTH EXTRACTIONS     prior to radiation   PEG TUBE PLACEMENT  06/13/11 at [facility] IR   10/2011 removed   PROSTATECTOMY  2002   TONSILLECTOMY     x2: as child then 1985 for Left lingual tonsil removed   TOTAL HIP ARTHROPLASTY Left 02/08/2016   Procedure: TOTAL HIP ARTHROPLASTY ANTERIOR APPROACH;  Surgeon: Jason Sensor, MD;  Location: [facility] OR;  Service: Orthopedics;  Laterality: Left;   ULNAR NERVE TRANSPOSITION Left 10/16/2018   Procedure: ULNAR NERVE DECOMPRESSION/TRANSPOSITION;  Surgeon: Jason Soulier, MD;  Location: [facility] OR;  Service: Orthopedics;  Laterality: Left;   Patient Active Problem List   Diagnosis Date Noted   Pulmonary embolism (HCC) 05/17/2020   Anticoagulated 05/17/2020   Cancer, 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   Ulnar neuritis, left 10/16/2018   Primary osteoarthritis of left hip 02/08/2016   Pre-operative cardiovascular examination 01/25/2016   Dilated aortic root 01/25/2016   Essential hypertension 01/25/2016   Hearing deficit 04/27/2014   Dry mouth 04/27/2014   Malignant neoplasm of base of tongue (HCC) 07/27/2011   Fatigue 07/26/2011   Aneurysm of thoracic aorta 09/05/2010   PATENT FORAMEN OVALE 11/28/2009   MYOCARDIAL INFARCTION, HX OF 11/24/2009   Hx of CABG 11/24/2009   PROSTATE CANCER, HX OF 11/24/2009   CEREBROVASCULAR ACCIDENT, HX OF 11/24/2009   Dyslipidemia 11/23/2009   MALAISE AND FATIGUE 11/23/2009   CHOLECYSTECTOMY, HX OF 11/23/2009    ONSET DATE:  Referred on 07/02/24   REFERRING DIAG: 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   Pt accompanied by: self  PERTINENT HISTORY: Per chart review: History of BOT scca treated with CRT in 2012 and newer diagnosis of right supraglottic cancer with laser excision on 03/17/2020 with Dr. Lauralee.     PAIN:  Are you having pain? No  FALLS: Has patient fallen in last 6 months?  Yes; 3  LIVING ENVIRONMENT: Lives with: lives with their spouse; Jason Noble Lives in: House/apartment  PLOF:  Level of assistance: Needed assistance with ADLs, Needed assistance with IADLS Employment: Retired; Agricultural Engineer   PATIENT GOALS: swallowing  OBJECTIVE:  Note: Objective measures were completed at Evaluation unless otherwise noted. OBJECTIVE:   DIAGNOSTIC FINDINGS: Per EMR  MR BRAIN WO CONTRAST 04/27/24 IMPRESSION: 1. No evidence of acute intracranial abnormality. 2. Moderate chronic microvascular ischemic disease and remote cerebellar and left frontal infarcts. 3. 4.  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     Electronically Signed   By: Jason Noble M.D.   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 Dysphagia is characterized by reduced bolus propulsion, incomplete airway protection, and reduced laryngeal sensation. There has been a decline in patient's swallowing function since last seen with increased aspiration events with thin liquids. Patient stopped completing his swallowing exercises several months ago. Patient was educated on risks and adverse outcomes associated with dysphagia and  aspiration. The relative risks and benefits of management options were reviewed. Patient denies PNA or respiratory challenges since last seen, although his wife notes he is coughing more often when eating. Patient plans to continue his current diet with awareness of his aspiration risk and adherence to aspiration precautions. He is interested in outpatient swallowing therapy, with goal of strengthening his swallowing musculature and improving airway protection, if this can be completed closer to home [facility]). We will send a referral through [facility]. Patient was advised to watch for signs of pneumonia including fevers, cough, chest discomfort, etc. Discussed return to clinic/hospital criteria.    Recommendations: Diet: patient plans to continue his current diet - self-selected solids and thin liquids Aspiration precautions:  sit upright during all PO (as close to 90 degrees as possible) take small bites/sips cough/clear throat regularly during the meal Medications: one at a time   Additional Recommendations: Swallowing therapy - outpatient therapy (  referral to [facility]) Repeat swallow evaluation - at [facility] in coordination with ENT; interim evaluation per treating theraist Patient should monitor closely for s/s of PNA and seek medical attention as needed   Prior/Pertinent Swallowing Evaluations: Multiple, most recent- *MBS 09/05/23- Impression: MBS per prior FEES to further assess the esophagus given the intermittent episodes of regurgitation in to the hypopharynx. MBS exam does show a CP bar with trace reflow of bolus residual in the cervical esophagus. An esophageal sweep showed evidence of dysmotility per the Radiologist with reduced clearance and tertiary contractions (see series 11). Given that patient has minimal impact from slight CP bar presence combined with esophageal findings, anticipate UES dilation may not be overly beneficial. Patient in agreement. His oropharyngeal swallow is  otherwise stable in appearance. Encourage him to continue with his current diet, adherence to swallow precautions, and home swallow therapy program. We will plan to follow-up with patient on 3/25 in coordination with his Dr. Lauralee appointment. Recommendations: Diet: continue current diet, self-selected solids and thin liquids Aspiration precautions:  sit upright during all PO fully swallow before next bite/sip cough/clear throat intermittently during the meal Medications: one at a time Additional Recommendations:  Swallowing therapy - continue home program Barium esophagram - may consider if esophageal complaints increase SLP follow-up (CSE) 3/25 in coordination with ENT return   *CSE 12/10/23- Impression: Patient seen for follow-up on swallowing in coordination with ENT visit. Patient with stable swallowing function. Reviewed prior swallow evaluation - MBS on 09/05/23. Patient with no additional swallowing concerns at this time. Reviewed rationale for continued swallowing exercises in the post-treatment setting. Encouraged patient to resume home program as recent performance has been limited due to additional health issues. Will plan to repeat patient's swallow evaluation on an annual basis, certainly sooner should additional concerns arise. Will coordinate with ENT appointments as able.  COGNITION: Overall cognitive status: History of cognitive impairments - at baseline; Seeing Dr. Gregg for Neurology   -Tangential; requires redirection   SUBJECTIVE DYSPHAGIA REPORTS:  Date of onset: ~10 years ago; worsened in 2021 Reported symptoms: coughing with both solids and liquids, xerostomia, and hoarseness  Current diet: regular and thin liquids  Co-morbid voice changes: No  FACTORS WHICH MAY INCREASE RISK OF ADVERSE EVENT IN PRESENCE OF ASPIRATION:  General health: frail or deconditioned  Risk factors: reduced cognitive function, weak cough, and compromised immune system    ORAL MOTOR  EXAMINATION: Overall status: WFL Comments: NA  CLINICAL SWALLOW ASSESSMENT:   Dentition: adequate natural dentition; partial bridge Vocal quality at baseline: normal and low vocal intensity Patient directly observed with POs: Yes: thin liquids  Feeding: able to feed self Liquids provided by: bottle [facility] Swallow Protocol: Pt silently aspirating; did not complete as he just had recent FEES Oral phase signs and symptoms: NA Pharyngeal phase signs and symptoms: multiple swallows and immediate throat clear  PATIENT REPORTED OUTCOME MEASURES (PROM): EAT-10: 31/40 (indicating more severe difficulty)  TREATMENT DATE:    PATIENT EDUCATION: Education details: Dysphagia, SLP role Person educated: Patient Education method: Explanation Education comprehension: verbalized understanding   ASSESSMENT:  CLINICAL IMPRESSION: Pt is a 83 yo male who presents to ST OP for evaluation post Flexible Endoscopic Evaluation of Swallowing (FEES). Pt has had speech therapy treatment in the past for pharyngeal strengthening exercises. He has not been completing exercises at home. Pt was unable to recall outcome of FEES until reminded by SLP. Pt is silently aspiration and per report, wife notes he is coughing more often while eating. Pt was tangential and required redirection back to questions. Pt was assessed using EAT-10 and was observed with single sips of water  - see above. SLP observed immediate throat clear ~5 throat clears per sip of liquid. Pt was observed to throat clear throughout evaluation in absence AND presence of liquid intake. Pt was able to recall a few of his swallow exercises he has been educated on by previous therapist at [facility]. SLP rec skilled ST services to address dysphagia. Pt to follow up with ENT/evaluating therapist in a few months.    OBJECTIVE IMPAIRMENTS:  include dysphagia. These impairments are limiting patient from safety when swallowing. Factors affecting potential to achieve goals and functional outcome are severity of impairments. Patient will benefit from skilled SLP services to address above impairments and improve overall function.  REHAB POTENTIAL: Fair due to severity    GOALS: Goals reviewed with patient? Yes  SHORT TERM GOALS= LONG TERM GOALS   10/06/23 (due to scheduling)  Pt will demonstrate use of pharyngeal strengthening exercises (tongue holds, effortful swallow, mendelsohn maneuver, CTAR) Baseline: Goal status: INITIAL  2.  Pt will recall aspiration precautions independently  Baseline:  Goal status: INITIAL  3.  Pt will improve score on PROM Baseline:  Goal status: INITIAL    PLAN:  SLP FREQUENCY: 1x/week  SLP DURATION: 6 weeks  PLANNED INTERVENTIONS: Aspiration precaution training, Pharyngeal strengthening exercises, Diet toleration management , Environmental controls, Trials of upgraded texture/liquids, Cueing hierachy, SLP instruction and feedback, Compensatory strategies, Patient/family education, and [id number] Treatment of swallowing function    [employer], CCC-SLP 08/05/2024, 12:26 PM

## 2024-08-06 ENCOUNTER — Encounter: Payer: Self-pay | Admitting: Physical Therapy

## 2024-08-06 ENCOUNTER — Ambulatory Visit: Admitting: Physical Therapy

## 2024-08-06 DIAGNOSIS — R1312 Dysphagia, oropharyngeal phase: Secondary | ICD-10-CM

## 2024-08-06 DIAGNOSIS — R2681 Unsteadiness on feet: Secondary | ICD-10-CM

## 2024-08-06 DIAGNOSIS — R296 Repeated falls: Secondary | ICD-10-CM

## 2024-08-06 DIAGNOSIS — R260 Ataxic gait: Secondary | ICD-10-CM | POA: Diagnosis not present

## 2024-08-06 DIAGNOSIS — R269 Unspecified abnormalities of gait and mobility: Secondary | ICD-10-CM | POA: Diagnosis not present

## 2024-08-06 DIAGNOSIS — M6281 Muscle weakness (generalized): Secondary | ICD-10-CM

## 2024-08-06 NOTE — Therapy (Signed)
 OUTPATIENT PHYSICAL THERAPY NEURO EVALUATION   Patient Name: Jason Noble MRN: [id number] DOB:1941-01-09, 83 y.o., male Today's Date: 08/06/2024   PCP: Arloa Elsie SAUNDERS, MD REFERRING PROVIDER: Gregg Lek, MD  END OF SESSION:  PT End of Session - 08/06/24 1256     Visit Number 2    Date for Recertification  10/12/24    PT Start Time 1300    PT Stop Time 1345    PT Time Calculation (min) 45 min    Activity Tolerance Patient tolerated treatment well    Behavior During Therapy [facility] for tasks assessed/performed          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    Basal cell carcinoma of skin    Broken jaw (HCC)    right lower    Cholecystitis    Coronary heart disease    has a stent   CVA (cerebral infarction) 2007   mild   Fatigue 07/26/2011   History of cancer chemotherapy    History of radiation therapy 06/18/2011- 08/03/2011   squamous cell tongue/69.96 Gy 103fx   Hypercholesteremia    under control   Hypertension    Myocardial infarction (HCC)    approx 2008 or 2009   Numbness    right lower    Numbness and tingling in left hand    numbess 2 fingers    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   Peripheral vascular disease    PFO (patent foramen ovale)    Prostate cancer (HCC) 2002   Skin cancer    basal cell, squamous cell   Squamous cell carcinoma of skin    Stroke (HCC)    appro in 08 or 09   Past Surgical History:  Procedure Laterality Date   CATARACT EXTRACTION W/ INTRAOCULAR LENS  IMPLANT, BILATERAL     CHOLECYSTECTOMY  04/2009   3 surgeries involved: lap chole x2 laparotomy   CORONARY ARTERY BYPASS GRAFT  1991   triple   DIRECT LARYNGOSCOPY N/A 02/17/2020   Procedure: DIRECT LARYNGOSCOPY WITH BIOPSY;  Surgeon: Jesus Oliphant, MD;  Location: [facility] OR;  Service: ENT;  Laterality: N/A;   ELBOW SURGERY  42 years ago   removal left radial head due to hockey accident    EXPLORATORY LAPAROTOMY  04/2010   Dr. Tanda (3rd surgery for gall bladder)   INGUINAL HERNIA REPAIR Right 09/23/2014   Procedure: RIGHT INGUINAL HERNIA REPAIR WITH MESH;  Surgeon: Krystal Russell, MD;  Location: [facility] ORS;  Service: General;  Laterality: Right;   INGUINAL HERNIA REPAIR Left 09/30/2015   Procedure: HERNIA REPAIR LEFT INGUINAL ADULT;  Surgeon: Krystal Russell, MD;  Location: [facility] ORS;  Service: General;  Laterality: Left;   INSERTION OF MESH Left 09/30/2015   Procedure: INSERTION OF MESH;  Surgeon: Krystal Russell, MD;  Location: [facility];  Service: General;  Laterality: Left;   MULTIPLE TOOTH EXTRACTIONS     prior to radiation   PEG TUBE PLACEMENT  06/13/11 at [facility]   10/2011 removed   PROSTATECTOMY  2002   TONSILLECTOMY     x2: as child then 1985 for Left lingual tonsil removed   TOTAL HIP ARTHROPLASTY Left 02/08/2016   Procedure: TOTAL HIP ARTHROPLASTY ANTERIOR APPROACH;  Surgeon: Dempsey Sensor, MD;  Location: [facility] OR;  Service: Orthopedics;  Laterality: Left;   ULNAR NERVE TRANSPOSITION Left 10/16/2018   Procedure: ULNAR NERVE DECOMPRESSION/TRANSPOSITION;  Surgeon: Dozier Soulier, MD;  Location: [facility] OR;  Service: Orthopedics;  Laterality: Left;   Patient Active Problem List   Diagnosis Date Noted   Pulmonary embolism (HCC) 05/17/2020   Anticoagulated 05/17/2020   Cancer, 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   Ulnar neuritis, left 10/16/2018   Primary osteoarthritis of left hip 02/08/2016   Pre-operative cardiovascular examination 01/25/2016   Dilated aortic root 01/25/2016   Essential hypertension 01/25/2016   Hearing deficit 04/27/2014   Dry mouth 04/27/2014   Malignant neoplasm of base of tongue (HCC) 07/27/2011   Fatigue 07/26/2011   Aneurysm of thoracic aorta 09/05/2010   PATENT FORAMEN OVALE 11/28/2009   MYOCARDIAL INFARCTION, HX OF 11/24/2009   Hx of CABG 11/24/2009   PROSTATE CANCER, HX OF 11/24/2009   CEREBROVASCULAR ACCIDENT, HX OF 11/24/2009   Dyslipidemia 11/23/2009    MALAISE AND FATIGUE 11/23/2009   CHOLECYSTECTOMY, HX OF 11/23/2009    ONSET DATE: 06/30/24  REFERRING DIAG: R26.9 - Abnormal gait   THERAPY DIAG:  Dysphagia, oropharyngeal phase  Gait instability  Muscle weakness (generalized)  Repeated falls  Unsteadiness on feet  Rationale for Evaluation and Treatment: Rehabilitation  SUBJECTIVE:                                                                                                                                                                                             SUBJECTIVE STATEMENT:  A little weak today  Pt reported having multiple falls over the last six months, around 4 falls. Pt stated he would like to get stronger in PT. Pt reported multiple health issues he has dealt with and recent health issues he is dealing with, including history of CA.  Pt accompanied by: self  PERTINENT HISTORY: See PMH chart above   PAIN:  Are you having pain? No  PRECAUTIONS: None  RED FLAGS: None   WEIGHT BEARING RESTRICTIONS: No  FALLS: Has patient fallen in last 6 months? Yes. Number of falls 4  LIVING ENVIRONMENT: Lives with: lives with their spouse Lives in: House/apartment Stairs: No Has following equipment at home: Single point cane and Walker - 2 wheeled  PLOF: Independent with basic ADLs and Independent with household mobility with device  PATIENT GOALS: To increase strength   OBJECTIVE:  Note: Objective measures were completed at Evaluation unless otherwise noted.  DIAGNOSTIC FINDINGS: Abnormal thyroid  results per Pastor Falling, MD   COGNITION: Overall cognitive status: No family/caregiver present to determine baseline cognitive functioning; remained [facility] with tasks; requires redirection.    SENSATION: WFL   POSTURE: rounded shoulders and anterior pelvic tilt  LOWER EXTREMITY ROM:     WNL  LOWER  EXTREMITY MMT:    MMT Right Eval Left Eval  Hip flexion 4/5 3+/5  Hip extension    Hip abduction 5/5  5/5  Hip adduction 5/5 5/5  Hip internal rotation    Hip external rotation    Knee flexion    Knee extension 4/5 4/5  Ankle dorsiflexion    Ankle plantarflexion    Ankle inversion    Ankle eversion    (Blank rows = not tested)  TRANSFERS: Sit to stand: Modified independence  Assistive device utilized: Environmental Consultant - 2 wheeled     Chair to chair: Modified independence, SBA, and CGA  Assistive device utilized: Environmental Consultant - 2 wheeled        GAIT: Findings: Distance walked: in clinic and Comments: Pt exhibits decreased hip and knee flexion bilaterally resulting in decreased foot clearance. Pt with decreased step length and decreased cadence. Pt foot width decreased resulting in narrow BOS.   FUNCTIONAL TESTS:  5 times sit to stand: 32s UE use  TUG: 27s 4 stage balance: feet together- 2s , half tandem- Unable to do, full tandem- Unable to do, SL stance- Not attempted                                                                                                                               TREATMENT DATE:  08/06/24 NuStep L 5 x6 min Sit to stand 2x10 Alt box taps 4in w/ SPC 2x10 CGA to min for stability HS curls 25lb 2x10 10lb Leg extensions 2x10 4in step ups 1 rail x10 Side steps and marches in // Standing ball toss  Elevated pus ups   08/03/24- Eval    PATIENT EDUCATION: Education details: HEP Person educated: Patient Education method: Medical Illustrator Education comprehension: verbalized understanding and returned demonstration  HOME EXERCISE PROGRAM: Access Code: [email or web address]  Date: 08/03/2024  Exercises - Standing March with Counter Support  - 1 x daily - 7 x weekly - 3 sets - 10 reps - Seated Long Arc Quad  - 1 x daily - 7 x weekly - 3 sets - 10 reps - Sit to Stand  - 1 x daily - 7 x weekly - 2 sets - 10 reps   GOALS: Goals reviewed with patient? Yes  SHORT TERM GOALS: Target date: 09/07/24  Patient will be independent with initial HEP. Baseline:  Goal  status: Met 08/06/24  2. Patient will demonstrate decreased fall risk by scoring < 20 sec on TUG. Baseline:  Goal status: INITIAL  3. Patient will be educated on strategies to  decrease risk of falls.  Baseline:  Goal status: INITIAL   LONG TERM GOALS: Target date: 10/13/23  Pt will perform 5xSTS in <25s  Baseline:  Goal status: INITIAL  2.  Patient will be independent with advanced/ongoing HEP to improve outcomes and carryover.  Baseline:  Goal status: INITIAL  3. Patient will demonstrate improved functional LE strength as demonstrated by 5/5 strength in BLEs.  Baseline:  Goal status: INITIAL  4.  Pt will perform 4 stage balance as follows: feet together >10s, half tandem >5s, tandem stance with UE support >10s  Baseline:  Goal status: INITIAL  ASSESSMENT:  CLINICAL IMPRESSION: Patient is a 83 y.o. male who was seen today for physical therapy for gait instability and repeated falls.  Pt exhibit abnormalities in gait which has resulted in repeated falls. Pt exhibits decreased strength and muscle imbalances in BLEs. CGA to min assist required with alt box taps and standing march. Pt tends to lose his balance posteriority. Cue needed for full ROM with leg extensions.   Pt will benefit from PT to increase strength, increase quality of gait, and improve ability to perform functional tasks safely.   OBJECTIVE IMPAIRMENTS: Abnormal gait, decreased activity tolerance, decreased balance, decreased endurance, decreased mobility, difficulty walking, decreased strength, increased muscle spasms, and improper body mechanics.   ACTIVITY LIMITATIONS: carrying, lifting, and bending  PARTICIPATION LIMITATIONS: driving  PERSONAL FACTORS: Age and Time since onset of injury/illness/exacerbation are also affecting patient's functional outcome.   REHAB POTENTIAL: Good  CLINICAL DECISION MAKING: Stable/uncomplicated  EVALUATION COMPLEXITY: Low  PLAN:  PT FREQUENCY: 1-2x/week  PT DURATION:  10 weeks  PLANNED INTERVENTIONS: 97110-Therapeutic exercises, 97530- Therapeutic activity, 97112- Neuromuscular re-education, 97535- Self Care, [id number]- Manual therapy, [phone]- Gait training, and Patient/Family education  PLAN FOR NEXT SESSION: Static and dynamic balance activities, LE strengthening, functional activities   Tanda KANDICE Sorrow, PTA 08/06/2024, 12:56 PM

## 2024-08-12 ENCOUNTER — Ambulatory Visit: Admitting: Physical Therapy

## 2024-08-12 ENCOUNTER — Encounter: Payer: Self-pay | Admitting: Physical Therapy

## 2024-08-12 DIAGNOSIS — R1312 Dysphagia, oropharyngeal phase: Secondary | ICD-10-CM | POA: Diagnosis not present

## 2024-08-12 DIAGNOSIS — R260 Ataxic gait: Secondary | ICD-10-CM

## 2024-08-12 DIAGNOSIS — R269 Unspecified abnormalities of gait and mobility: Secondary | ICD-10-CM | POA: Diagnosis not present

## 2024-08-12 DIAGNOSIS — R296 Repeated falls: Secondary | ICD-10-CM | POA: Diagnosis not present

## 2024-08-12 DIAGNOSIS — R2681 Unsteadiness on feet: Secondary | ICD-10-CM

## 2024-08-12 DIAGNOSIS — M6281 Muscle weakness (generalized): Secondary | ICD-10-CM | POA: Diagnosis not present

## 2024-08-12 NOTE — Therapy (Signed)
 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    Basal cell carcinoma of skin    Broken jaw (HCC)    right lower    Cholecystitis    Coronary heart disease    has a stent   CVA (cerebral infarction) 2007   mild   Fatigue 07/26/2011   History of cancer chemotherapy    History of radiation therapy 06/18/2011- 08/03/2011   squamous cell tongue/69.96 Gy 19fx   Hypercholesteremia    under control   Hypertension    Myocardial infarction (HCC)    approx 2008 or 2009   Numbness    right lower    Numbness and tingling in left hand    numbess 2 fingers    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   Peripheral vascular disease    PFO (patent foramen ovale)    Prostate cancer (HCC) 2002   Skin cancer    basal cell, squamous cell   Squamous cell carcinoma of skin    Stroke (HCC)    appro in 08 or 09   Past Surgical History:  Procedure Laterality Date   CATARACT EXTRACTION W/ INTRAOCULAR LENS  IMPLANT, BILATERAL     CHOLECYSTECTOMY  04/2009   3 surgeries involved: lap chole x2 laparotomy   CORONARY ARTERY BYPASS GRAFT  1991   triple   DIRECT LARYNGOSCOPY N/A 02/17/2020   Procedure: DIRECT LARYNGOSCOPY WITH BIOPSY;  Surgeon: Jesus Oliphant, MD;  Location: [facility] OR;  Service: ENT;  Laterality: N/A;   ELBOW SURGERY  42 years ago   removal left radial head due to hockey accident   EXPLORATORY LAPAROTOMY  04/2010   Dr. Tanda (3rd surgery for  gall bladder)   INGUINAL HERNIA REPAIR Right 09/23/2014   Procedure: RIGHT INGUINAL HERNIA REPAIR WITH MESH;  Surgeon: Krystal Russell, MD;  Location: [facility];  Service: General;  Laterality: Right;   INGUINAL HERNIA REPAIR Left 09/30/2015   Procedure: HERNIA REPAIR LEFT INGUINAL ADULT;  Surgeon: Krystal Russell, MD;  Location: [facility] OR[facility];  Service: General;  Laterality: Left;   INSERTION OF MESH Left 09/30/2015   Procedure: INSERTION OF MESH;  Surgeon: Krystal Russell, MD;  Location: [facility];  Service: General;  Laterality: Left;   MULTIPLE TOOTH EXTRACTIONS     prior to radiation   PEG TUBE PLACEMENT  06/13/11 at [facility] IR   10/2011 removed   PROSTATECTOMY  2002   TONSILLECTOMY     x2: as child then 1985 for Left lingual tonsil removed   TOTAL HIP ARTHROPLASTY Left 02/08/2016   Procedure: TOTAL HIP ARTHROPLASTY ANTERIOR APPROACH;  Surgeon: Dempsey Sensor, MD;  Location: [facility] OR;  Service: Orthopedics;  Laterality: Left;   ULNAR NERVE TRANSPOSITION Left 10/16/2018   Procedure: ULNAR NERVE DECOMPRESSION/TRANSPOSITION;  Surgeon: Dozier Soulier, MD;  Location: [facility] OR;  Service: Orthopedics;  Laterality: Left;   Patient Active Problem List   Diagnosis Date Noted   Pulmonary embolism (HCC) 05/17/2020   Anticoagulated 05/17/2020   Cancer, 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   Ulnar neuritis, left 10/16/2018   Primary osteoarthritis of left hip 02/08/2016   Pre-operative cardiovascular examination 01/25/2016   Dilated aortic root 01/25/2016   Essential hypertension 01/25/2016   Hearing deficit 04/27/2014   Dry mouth 04/27/2014   Malignant neoplasm of base of tongue (HCC) 07/27/2011   Fatigue 07/26/2011   Aneurysm of thoracic aorta 09/05/2010   PATENT FORAMEN OVALE 11/28/2009   MYOCARDIAL INFARCTION, HX OF 11/24/2009   Hx of CABG 11/24/2009   PROSTATE CANCER, HX OF 11/24/2009   CEREBROVASCULAR ACCIDENT, HX OF 11/24/2009   Dyslipidemia 11/23/2009   MALAISE AND FATIGUE 11/23/2009   CHOLECYSTECTOMY, HX OF  11/23/2009    ONSET DATE: 06/30/24  REFERRING DIAG: R26.9 - Abnormal gait   THERAPY DIAG:  Gait instability  Muscle weakness (generalized)  Unsteadiness on feet  Repeated falls  Ataxic gait  Rationale for Evaluation and Treatment: Rehabilitation  SUBJECTIVE:                                                                                                                                                                                             SUBJECTIVE STATEMENT: Pretty good fell into the couch yesterday, didn't hip to floor, just lost his blanace  Pt reported having multiple falls over the last six months, around 4 falls. Pt stated he would like to get stronger in PT. Pt reported multiple health issues he has dealt with and recent health issues he is dealing with, including history of CA.  Pt accompanied by: self  PERTINENT HISTORY: See PMH chart above   PAIN:  Are you having pain? No  PRECAUTIONS: None  RED FLAGS: None   WEIGHT BEARING RESTRICTIONS: No  FALLS: Has patient fallen in last 6 months? Yes. Number of falls 4  LIVING ENVIRONMENT: Lives with: lives with their spouse Lives in: House/apartment Stairs: No Has following equipment at home: Single point cane and Walker - 2 wheeled  PLOF: Independent with basic ADLs and Independent with household mobility with device  PATIENT GOALS: To increase strength   OBJECTIVE:  Note: Objective measures were completed at Evaluation unless otherwise noted.  DIAGNOSTIC FINDINGS: Abnormal thyroid  results per Pastor Falling, MD   COGNITION: Overall cognitive status: No family/caregiver present to determine baseline cognitive functioning; remained [facility] with tasks; requires redirection.    SENSATION: WFL   POSTURE: rounded shoulders and anterior pelvic tilt  LOWER EXTREMITY ROM:     WNL  LOWER  EXTREMITY MMT:    MMT Right Eval Left Eval PT/LT 08/12/24  Hip flexion 4/5 3+/5 4/4  Hip extension     Hip  abduction 5/5 5/5 5/5  Hip adduction 5/5 5/5 5/5  Hip internal rotation     Hip external rotation     Knee flexion   5/4+  Knee extension 4/5 4/5 4/4  Ankle dorsiflexion     Ankle plantarflexion     Ankle inversion     Ankle eversion     (Blank rows = not tested)  TRANSFERS: Sit to stand: Modified independence  Assistive device utilized: Environmental Consultant - 2 wheeled     Chair to chair: Modified independence, SBA, and CGA  Assistive device utilized: Environmental Consultant - 2 wheeled        GAIT: Findings: Distance walked: in clinic and Comments: Pt exhibits decreased hip and knee flexion bilaterally resulting in decreased foot clearance. Pt with decreased step length and decreased cadence. Pt foot width decreased resulting in narrow BOS.   FUNCTIONAL TESTS:  5 times sit to stand: 32s UE use  TUG: 27s 4 stage balance: feet together- 2s , half tandem- Unable to do, full tandem- Unable to do, SL stance- Not attempted                                                                                                                               TREATMENT DATE:  08/12/24 NuStep L5 x 6 min Sit to stand x10, holding yellow ball x10 4 inch step ups x5, 6 inch x 10 with 2 rails Leg press 40lb 2x10 TUG 17.79 Alt 6 in box taps x5 mod guard  4in box min guard  HS curls 25lb 2x10 Leg Ext 10lb 2x10   08/06/24 NuStep L 5 x6 min Sit to stand 2x10 Alt box taps 4in w/ SPC 2x10 CGA to min for stability HS curls 25lb 2x10 10lb Leg extensions 2x10 4in step ups 1 rail x10 Side steps and marches in // Standing ball toss  Elevated pus ups   08/03/24- Eval    PATIENT EDUCATION: Education details: HEP Person educated: Patient Education method: Medical Illustrator Education comprehension: verbalized understanding and returned demonstration  HOME EXERCISE PROGRAM: Access Code: [email or web address]  Date: 08/03/2024  Exercises - Standing March with Counter Support  - 1 x daily - 7 x weekly - 3 sets - 10 reps -  Seated Long Arc Quad  - 1 x daily - 7 x weekly - 3 sets - 10 reps - Sit to Stand  - 1 x daily - 7 x weekly - 2 sets - 10 reps   GOALS: Goals reviewed with patient? Yes  SHORT TERM GOALS: Target date: 09/07/24  Patient will be independent with initial HEP. Baseline:  Goal status: Met 08/06/24  2. Patient will demonstrate decreased fall risk by scoring < 20 sec on TUG. Baseline:  Goal status: Met 08/12/24 17.97 sec   3. Patient will  be educated on strategies to  decrease risk of falls.  Baseline:  Goal status: Progressing    LONG TERM GOALS: Target date: 10/13/23  Pt will perform 5xSTS in <25s  Baseline:  Goal status: INITIAL  2.  Patient will be independent with advanced/ongoing HEP to improve outcomes and carryover.  Baseline:  Goal status: INITIAL  3. Patient will demonstrate improved functional LE strength as demonstrated by 5/5 strength in BLEs. Baseline:  Goal status: INITIAL  4.  Pt will perform 4 stage balance as follows: feet together >10s, half tandem >5s, tandem stance with UE support >10s  Baseline:  Goal status: INITIAL  ASSESSMENT:  CLINICAL IMPRESSION: Patient is a 83 y.o. male who was seen today for physical therapy for gait instability and repeated falls.  Pt exhibit abnormalities in gait which has resulted in repeated falls. He has progressed towards goals. Cues needed to complete full ROM with leg curls and extensions. Instability with alt box taps, 6 in was too difficult. No pain during session.  Pt will benefit from PT to increase strength, increase quality of gait, and improve ability to perform functional tasks safely.   OBJECTIVE IMPAIRMENTS: Abnormal gait, decreased activity tolerance, decreased balance, decreased endurance, decreased mobility, difficulty walking, decreased strength, increased muscle spasms, and improper body mechanics.   ACTIVITY LIMITATIONS: carrying, lifting, and bending  PARTICIPATION LIMITATIONS: driving  PERSONAL FACTORS:  Age and Time since onset of injury/illness/exacerbation are also affecting patient's functional outcome.   REHAB POTENTIAL: Good  CLINICAL DECISION MAKING: Stable/uncomplicated  EVALUATION COMPLEXITY: Low  PLAN:  PT FREQUENCY: 1-2x/week  PT DURATION: 10 weeks  PLANNED INTERVENTIONS: 97110-Therapeutic exercises, 97530- Therapeutic activity, 97112- Neuromuscular re-education, 97535- Self Care, 02859- Manual therapy, [phone]- Gait training, and Patient/Family education  PLAN FOR NEXT SESSION: Static and dynamic balance activities, LE strengthening, functional activities   Tanda KANDICE Sorrow, PTA 08/12/2024, 2:31 PM

## 2024-08-17 ENCOUNTER — Ambulatory Visit: Admitting: Physical Therapy

## 2024-08-17 ENCOUNTER — Encounter: Payer: Self-pay | Admitting: Physical Therapy

## 2024-08-17 DIAGNOSIS — R260 Ataxic gait: Secondary | ICD-10-CM | POA: Insufficient documentation

## 2024-08-17 DIAGNOSIS — R2681 Unsteadiness on feet: Secondary | ICD-10-CM | POA: Insufficient documentation

## 2024-08-17 DIAGNOSIS — M6281 Muscle weakness (generalized): Secondary | ICD-10-CM | POA: Insufficient documentation

## 2024-08-17 DIAGNOSIS — R296 Repeated falls: Secondary | ICD-10-CM | POA: Insufficient documentation

## 2024-08-17 DIAGNOSIS — R1312 Dysphagia, oropharyngeal phase: Secondary | ICD-10-CM | POA: Insufficient documentation

## 2024-08-17 NOTE — Therapy (Signed)
 OUTPATIENT PHYSICAL THERAPY NEURO EVALUATION   Patient Name: Jason Noble MRN: [id number] DOB:[birth year], 83 y.o., male Today's Date: 08/17/2024   PCP: Arloa Elsie SAUNDERS, MD REFERRING PROVIDER: Gregg Lek, MD  END OF SESSION:  PT End of Session - 08/17/24 1305     Visit Number 4    Date for Recertification  10/12/24    PT Start Time 1300    PT Stop Time 1345    PT Time Calculation (min) 45 min    Activity Tolerance Patient tolerated treatment well    Behavior During Therapy [facility] for tasks assessed/performed          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    Basal cell carcinoma of skin    Broken jaw (HCC)    right lower    Cholecystitis    Coronary heart disease    has a stent   CVA (cerebral infarction) 2007   mild   Fatigue 07/26/2011   History of cancer chemotherapy    History of radiation therapy 06/18/2011- 08/03/2011   squamous cell tongue/69.96 Gy 97fx   Hypercholesteremia    under control   Hypertension    Myocardial infarction (HCC)    approx 2008 or 2009   Numbness    right lower    Numbness and tingling in left hand    numbess 2 fingers    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   Peripheral vascular disease    PFO (patent foramen ovale)    Prostate cancer (HCC) 2002   Skin cancer    basal cell, squamous cell   Squamous cell carcinoma of skin    Stroke (HCC)    appro in 08 or 09   Past Surgical History:  Procedure Laterality Date   CATARACT EXTRACTION W/ INTRAOCULAR LENS  IMPLANT, BILATERAL     CHOLECYSTECTOMY  04/2009   3 surgeries involved: lap chole x2 laparotomy   CORONARY ARTERY BYPASS GRAFT  1991   triple   DIRECT LARYNGOSCOPY N/A 02/17/2020   Procedure: DIRECT LARYNGOSCOPY WITH BIOPSY;  Surgeon: Jesus Oliphant, MD;  Location: [facility] OR;  Service: ENT;  Laterality: N/A;   ELBOW SURGERY  42 years ago   removal left radial head due to hockey accident    EXPLORATORY LAPAROTOMY  04/2010   Dr. Tanda (3rd surgery for gall bladder)   INGUINAL HERNIA REPAIR Right 09/23/2014   Procedure: RIGHT INGUINAL HERNIA REPAIR WITH MESH;  Surgeon: Krystal Russell, MD;  Location: [facility] ORS;  Service: General;  Laterality: Right;   INGUINAL HERNIA REPAIR Left 09/30/2015   Procedure: HERNIA REPAIR LEFT INGUINAL ADULT;  Surgeon: Krystal Russell, MD;  Location: [facility] ORS;  Service: General;  Laterality: Left;   INSERTION OF MESH Left 09/30/2015   Procedure: INSERTION OF MESH;  Surgeon: Krystal Russell, MD;  Location: [facility] ORS;  Service: General;  Laterality: Left;   MULTIPLE TOOTH EXTRACTIONS     prior to radiation   PEG TUBE PLACEMENT  06/13/11 at [facility]   10/2011 removed   PROSTATECTOMY  2002   TONSILLECTOMY     x2: as child then 1985 for Left lingual tonsil removed   TOTAL HIP ARTHROPLASTY Left 02/08/2016   Procedure: TOTAL HIP ARTHROPLASTY ANTERIOR APPROACH;  Surgeon: Dempsey Sensor, MD;  Location: [facility] OR;  Service: Orthopedics;  Laterality: Left;   ULNAR NERVE TRANSPOSITION Left 10/16/2018   Procedure: ULNAR NERVE DECOMPRESSION/TRANSPOSITION;  Surgeon: Dozier Soulier, MD;  Location: [facility] OR;  Service: Orthopedics;  Laterality: Left;   Patient Active Problem List   Diagnosis Date Noted   Pulmonary embolism (HCC) 05/17/2020   Anticoagulated 05/17/2020   Cancer, 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   Ulnar neuritis, left 10/16/2018   Primary osteoarthritis of left hip 02/08/2016   Pre-operative cardiovascular examination 01/25/2016   Dilated aortic root 01/25/2016   Essential hypertension 01/25/2016   Hearing deficit 04/27/2014   Dry mouth 04/27/2014   Malignant neoplasm of base of tongue (HCC) 07/27/2011   Fatigue 07/26/2011   Aneurysm of thoracic aorta 09/05/2010   PATENT FORAMEN OVALE 11/28/2009   MYOCARDIAL INFARCTION, HX OF 11/24/2009   Hx of CABG 11/24/2009   PROSTATE CANCER, HX OF 11/24/2009   CEREBROVASCULAR ACCIDENT, HX OF 11/24/2009   Dyslipidemia 11/23/2009    MALAISE AND FATIGUE 11/23/2009   CHOLECYSTECTOMY, HX OF 11/23/2009    ONSET DATE: 06/30/24  REFERRING DIAG: R26.9 - Abnormal gait   THERAPY DIAG:  Gait instability  Muscle weakness (generalized)  Unsteadiness on feet  Repeated falls  Ataxic gait  Rationale for Evaluation and Treatment: Rehabilitation  SUBJECTIVE:                                                                                                                                                                                             SUBJECTIVE STATEMENT: I feel pretty good  Pt reported having multiple falls over the last six months, around 4 falls. Pt stated he would like to get stronger in PT. Pt reported multiple health issues he has dealt with and recent health issues he is dealing with, including history of CA.  Pt accompanied by: self  PERTINENT HISTORY: See PMH chart above   PAIN:  Are you having pain? No  PRECAUTIONS: None  RED FLAGS: None   WEIGHT BEARING RESTRICTIONS: No  FALLS: Has patient fallen in last 6 months? Yes. Number of falls 4  LIVING ENVIRONMENT: Lives with: lives with their spouse Lives in: House/apartment Stairs: No Has following equipment at home: Single point cane and Walker - 2 wheeled  PLOF: Independent with basic ADLs and Independent with household mobility with device  PATIENT GOALS: To increase strength   OBJECTIVE:  Note: Objective measures were completed at Evaluation unless otherwise noted.  DIAGNOSTIC FINDINGS: Abnormal thyroid  results per Pastor Falling, MD   COGNITION: Overall cognitive status: No family/caregiver present to determine baseline cognitive functioning; remained [facility] with tasks; requires redirection.    SENSATION: WFL   POSTURE: rounded shoulders and anterior pelvic tilt  LOWER EXTREMITY ROM:     WNL  LOWER EXTREMITY MMT:  MMT Right Eval Left Eval PT/LT 08/12/24  Hip flexion 4/5 3+/5 4/4  Hip extension     Hip abduction 5/5  5/5 5/5  Hip adduction 5/5 5/5 5/5  Hip internal rotation     Hip external rotation     Knee flexion   5/4+  Knee extension 4/5 4/5 4/4  Ankle dorsiflexion     Ankle plantarflexion     Ankle inversion     Ankle eversion     (Blank rows = not tested)  TRANSFERS: Sit to stand: Modified independence  Assistive device utilized: Environmental Consultant - 2 wheeled     Chair to chair: Modified independence, SBA, and CGA  Assistive device utilized: Environmental Consultant - 2 wheeled        GAIT: Findings: Distance walked: in clinic and Comments: Pt exhibits decreased hip and knee flexion bilaterally resulting in decreased foot clearance. Pt with decreased step length and decreased cadence. Pt foot width decreased resulting in narrow BOS.   FUNCTIONAL TESTS:  5 times sit to stand: 32s UE use  TUG: 27s 4 stage balance: feet together- 2s , half tandem- Unable to do, full tandem- Unable to do, SL stance- Not attempted                                                                                                                               TREATMENT DATE:  08/17/24 NuStep L 5 x 6 min HS curls 25lb 2x12 Leg Ext 10lb 2x12  -5x sit to stand 18.63 sec Leg press 60lb 2x12, heel raises 60lb 2x12 Shoulder Ext 10lb 2x10  Tactile cues for posture Sit to stands LE on airex 2x10  08/12/24 NuStep L5 x 6 min Sit to stand x10, holding yellow ball x10 4 inch step ups x5, 6 inch x 10 with 2 rails Leg press 40lb 2x10 TUG 17.79 Alt 6 in box taps x5 mod guard  4in box min guard  HS curls 25lb 2x10 Leg Ext 10lb 2x10   08/06/24 NuStep L 5 x6 min Sit to stand 2x10 Alt box taps 4in w/ SPC 2x10 CGA to min for stability HS curls 25lb 2x10 10lb Leg extensions 2x10 4in step ups 1 rail x10 Side steps and marches in // Standing ball toss  Elevated pus ups   08/03/24- Eval    PATIENT EDUCATION: Education details: HEP Person educated: Patient Education method: Medical Illustrator Education comprehension:  verbalized understanding and returned demonstration  HOME EXERCISE PROGRAM: Access Code: [email or web address]  Date: 08/03/2024  Exercises - Standing March with Counter Support  - 1 x daily - 7 x weekly - 3 sets - 10 reps - Seated Long Arc Quad  - 1 x daily - 7 x weekly - 3 sets - 10 reps - Sit to Stand  - 1 x daily - 7 x weekly - 2 sets - 10 reps   GOALS: Goals reviewed with patient? Yes  SHORT TERM GOALS: Target date: 09/07/24  Patient will be independent with initial HEP. Baseline:  Goal status: Met 08/06/24  2. Patient will demonstrate decreased fall risk by scoring < 20 sec on TUG. Baseline:  Goal status: Met 08/12/24 17.97 sec   3. Patient will be educated on strategies to  decrease risk of falls.  Baseline:  Goal status: Progressing    LONG TERM GOALS: Target date: 10/13/23  Pt will perform 5xSTS in <25s  Baseline:  Goal status: Met 08/17/24  2.  Patient will be independent with advanced/ongoing HEP to improve outcomes and carryover.  Baseline:  Goal status: INITIAL  3. Patient will demonstrate improved functional LE strength as demonstrated by 5/5 strength in BLEs. Baseline:  Goal status: INITIAL  4.  Pt will perform 4 stage balance as follows: feet together >10s, half tandem >5s, tandem stance with UE support >10s  Baseline:  Goal status: INITIAL  ASSESSMENT:  CLINICAL IMPRESSION: Patient is a 83 y.o. male who was seen today for physical therapy for gait instability and repeated falls.  Progressed meeting 5 x sit to stand goals. Increase resistance tolerated on leg press. L side weakness with shoulder Ext.  Pt exhibit abnormalities in gait which has resulted in repeated falls. Cues needed to complete full ROM with leg curls and extensions. I No pain during session.  Pt will benefit from PT to increase strength, increase quality of gait, and improve ability to perform functional tasks safely.   OBJECTIVE IMPAIRMENTS: Abnormal gait, decreased activity tolerance, decreased  balance, decreased endurance, decreased mobility, difficulty walking, decreased strength, increased muscle spasms, and improper body mechanics.   ACTIVITY LIMITATIONS: carrying, lifting, and bending  PARTICIPATION LIMITATIONS: driving  PERSONAL FACTORS: Age and Time since onset of injury/illness/exacerbation are also affecting patient's functional outcome.   REHAB POTENTIAL: Good  CLINICAL DECISION MAKING: Stable/uncomplicated  EVALUATION COMPLEXITY: Low  PLAN:  PT FREQUENCY: 1-2x/week  PT DURATION: 10 weeks  PLANNED INTERVENTIONS: 97110-Therapeutic exercises, 97530- Therapeutic activity, 97112- Neuromuscular re-education, 97535- Self Care, 02859- Manual therapy, [phone]- Gait training, and Patient/Family education  PLAN FOR NEXT SESSION: Static and dynamic balance activities, LE strengthening, functional activities   Tanda KANDICE Sorrow, PTA 08/17/2024, 1:05 PM

## 2024-08-18 ENCOUNTER — Emergency Department (HOSPITAL_COMMUNITY)

## 2024-08-18 ENCOUNTER — Inpatient Hospital Stay (HOSPITAL_COMMUNITY)

## 2024-08-18 ENCOUNTER — Other Ambulatory Visit: Payer: Self-pay

## 2024-08-18 ENCOUNTER — Inpatient Hospital Stay (HOSPITAL_COMMUNITY)
Admission: EM | Admit: 2024-08-18 | Discharge: 2024-08-20 | DRG: 193 | Disposition: A | Attending: Internal Medicine | Admitting: Internal Medicine

## 2024-08-18 ENCOUNTER — Encounter (HOSPITAL_COMMUNITY): Payer: Self-pay

## 2024-08-18 DIAGNOSIS — Z85828 Personal history of other malignant neoplasm of skin: Secondary | ICD-10-CM

## 2024-08-18 DIAGNOSIS — E7800 Pure hypercholesterolemia, unspecified: Secondary | ICD-10-CM | POA: Diagnosis present

## 2024-08-18 DIAGNOSIS — I712 Thoracic aortic aneurysm, without rupture, unspecified: Secondary | ICD-10-CM | POA: Diagnosis present

## 2024-08-18 DIAGNOSIS — I2510 Atherosclerotic heart disease of native coronary artery without angina pectoris: Secondary | ICD-10-CM | POA: Diagnosis present

## 2024-08-18 DIAGNOSIS — K5900 Constipation, unspecified: Secondary | ICD-10-CM | POA: Insufficient documentation

## 2024-08-18 DIAGNOSIS — J189 Pneumonia, unspecified organism: Secondary | ICD-10-CM | POA: Diagnosis present

## 2024-08-18 DIAGNOSIS — I252 Old myocardial infarction: Secondary | ICD-10-CM | POA: Diagnosis not present

## 2024-08-18 DIAGNOSIS — Z85810 Personal history of malignant neoplasm of tongue: Secondary | ICD-10-CM | POA: Insufficient documentation

## 2024-08-18 DIAGNOSIS — I739 Peripheral vascular disease, unspecified: Secondary | ICD-10-CM | POA: Diagnosis present

## 2024-08-18 DIAGNOSIS — Z923 Personal history of irradiation: Secondary | ICD-10-CM

## 2024-08-18 DIAGNOSIS — E559 Vitamin D deficiency, unspecified: Secondary | ICD-10-CM | POA: Insufficient documentation

## 2024-08-18 DIAGNOSIS — E785 Hyperlipidemia, unspecified: Secondary | ICD-10-CM | POA: Diagnosis present

## 2024-08-18 DIAGNOSIS — R609 Edema, unspecified: Secondary | ICD-10-CM | POA: Diagnosis not present

## 2024-08-18 DIAGNOSIS — R296 Repeated falls: Secondary | ICD-10-CM | POA: Diagnosis present

## 2024-08-18 DIAGNOSIS — I129 Hypertensive chronic kidney disease with stage 1 through stage 4 chronic kidney disease, or unspecified chronic kidney disease: Secondary | ICD-10-CM | POA: Diagnosis present

## 2024-08-18 DIAGNOSIS — Z87891 Personal history of nicotine dependence: Secondary | ICD-10-CM | POA: Diagnosis not present

## 2024-08-18 DIAGNOSIS — I672 Cerebral atherosclerosis: Secondary | ICD-10-CM | POA: Diagnosis not present

## 2024-08-18 DIAGNOSIS — Z955 Presence of coronary angioplasty implant and graft: Secondary | ICD-10-CM

## 2024-08-18 DIAGNOSIS — Z8249 Family history of ischemic heart disease and other diseases of the circulatory system: Secondary | ICD-10-CM | POA: Diagnosis not present

## 2024-08-18 DIAGNOSIS — Z85818 Personal history of malignant neoplasm of other sites of lip, oral cavity, and pharynx: Secondary | ICD-10-CM

## 2024-08-18 DIAGNOSIS — Z96642 Presence of left artificial hip joint: Secondary | ICD-10-CM | POA: Diagnosis not present

## 2024-08-18 DIAGNOSIS — M1611 Unilateral primary osteoarthritis, right hip: Secondary | ICD-10-CM | POA: Diagnosis not present

## 2024-08-18 DIAGNOSIS — I959 Hypotension, unspecified: Secondary | ICD-10-CM | POA: Diagnosis not present

## 2024-08-18 DIAGNOSIS — R0989 Other specified symptoms and signs involving the circulatory and respiratory systems: Secondary | ICD-10-CM | POA: Diagnosis not present

## 2024-08-18 DIAGNOSIS — Z9221 Personal history of antineoplastic chemotherapy: Secondary | ICD-10-CM

## 2024-08-18 DIAGNOSIS — I878 Other specified disorders of veins: Secondary | ICD-10-CM | POA: Diagnosis not present

## 2024-08-18 DIAGNOSIS — Q2112 Patent foramen ovale: Secondary | ICD-10-CM

## 2024-08-18 DIAGNOSIS — Z9049 Acquired absence of other specified parts of digestive tract: Secondary | ICD-10-CM | POA: Diagnosis not present

## 2024-08-18 DIAGNOSIS — Z7982 Long term (current) use of aspirin: Secondary | ICD-10-CM | POA: Diagnosis not present

## 2024-08-18 DIAGNOSIS — N1831 Chronic kidney disease, stage 3a: Secondary | ICD-10-CM | POA: Diagnosis present

## 2024-08-18 DIAGNOSIS — Z951 Presence of aortocoronary bypass graft: Secondary | ICD-10-CM

## 2024-08-18 DIAGNOSIS — Z8546 Personal history of malignant neoplasm of prostate: Secondary | ICD-10-CM | POA: Diagnosis not present

## 2024-08-18 DIAGNOSIS — R531 Weakness: Secondary | ICD-10-CM | POA: Diagnosis not present

## 2024-08-18 DIAGNOSIS — I10 Essential (primary) hypertension: Secondary | ICD-10-CM | POA: Diagnosis present

## 2024-08-18 DIAGNOSIS — Z79899 Other long term (current) drug therapy: Secondary | ICD-10-CM

## 2024-08-18 DIAGNOSIS — J9601 Acute respiratory failure with hypoxia: Secondary | ICD-10-CM | POA: Diagnosis present

## 2024-08-18 DIAGNOSIS — D631 Anemia in chronic kidney disease: Secondary | ICD-10-CM | POA: Diagnosis present

## 2024-08-18 DIAGNOSIS — R7303 Prediabetes: Secondary | ICD-10-CM | POA: Insufficient documentation

## 2024-08-18 DIAGNOSIS — R918 Other nonspecific abnormal finding of lung field: Secondary | ICD-10-CM | POA: Diagnosis not present

## 2024-08-18 DIAGNOSIS — Z85820 Personal history of malignant melanoma of skin: Secondary | ICD-10-CM | POA: Insufficient documentation

## 2024-08-18 DIAGNOSIS — E039 Hypothyroidism, unspecified: Secondary | ICD-10-CM | POA: Diagnosis present

## 2024-08-18 DIAGNOSIS — Z823 Family history of stroke: Secondary | ICD-10-CM | POA: Diagnosis not present

## 2024-08-18 DIAGNOSIS — D649 Anemia, unspecified: Secondary | ICD-10-CM | POA: Diagnosis present

## 2024-08-18 DIAGNOSIS — R61 Generalized hyperhidrosis: Secondary | ICD-10-CM | POA: Diagnosis not present

## 2024-08-18 DIAGNOSIS — I6782 Cerebral ischemia: Secondary | ICD-10-CM | POA: Diagnosis not present

## 2024-08-18 DIAGNOSIS — Z8673 Personal history of transient ischemic attack (TIA), and cerebral infarction without residual deficits: Secondary | ICD-10-CM

## 2024-08-18 DIAGNOSIS — W19XXXA Unspecified fall, initial encounter: Principal | ICD-10-CM

## 2024-08-18 DIAGNOSIS — S0990XA Unspecified injury of head, initial encounter: Secondary | ICD-10-CM | POA: Diagnosis not present

## 2024-08-18 DIAGNOSIS — J168 Pneumonia due to other specified infectious organisms: Secondary | ICD-10-CM | POA: Diagnosis not present

## 2024-08-18 DIAGNOSIS — R0902 Hypoxemia: Secondary | ICD-10-CM | POA: Diagnosis not present

## 2024-08-18 LAB — BASIC METABOLIC PANEL WITH GFR
Anion gap: 9 (ref 5–15)
BUN: 35 mg/dL — ABNORMAL HIGH (ref 8–23)
CO2: 24 mmol/L (ref 22–32)
Calcium: 9.4 mg/dL (ref 8.9–10.3)
Chloride: 104 mmol/L (ref 98–111)
Creatinine, Ser: 1.34 mg/dL — ABNORMAL HIGH (ref 0.61–1.24)
GFR, Estimated: 53 mL/min — ABNORMAL LOW (ref >60)
Glucose, Bld: 105 mg/dL — ABNORMAL HIGH (ref 70–99)
Potassium: 3.7 mmol/L (ref 3.5–5.1)
Sodium: 138 mmol/L (ref 135–145)

## 2024-08-18 LAB — CBC WITH DIFFERENTIAL/PLATELET
Abs Immature Granulocytes: 0.05 K/uL (ref 0.00–0.07)
Basophils Absolute: 0 K/uL (ref 0.0–0.1)
Basophils Relative: 0 %
Eosinophils Absolute: 0 K/uL (ref 0.0–0.5)
Eosinophils Relative: 0 %
HCT: 34.6 % — ABNORMAL LOW (ref 39.0–52.0)
Hemoglobin: 11 g/dL — ABNORMAL LOW (ref 13.0–17.0)
Immature Granulocytes: 0 %
Lymphocytes Relative: 2 %
Lymphs Abs: 0.3 K/uL — ABNORMAL LOW (ref 0.7–4.0)
MCH: 28.5 pg (ref 26.0–34.0)
MCHC: 31.8 g/dL (ref 30.0–36.0)
MCV: 89.6 fL (ref 80.0–100.0)
Monocytes Absolute: 1 K/uL (ref 0.1–1.0)
Monocytes Relative: 7 %
Neutro Abs: 12.6 K/uL — ABNORMAL HIGH (ref 1.7–7.7)
Neutrophils Relative %: 91 %
Platelets: 161 K/uL (ref 150–400)
RBC: 3.86 MIL/uL — ABNORMAL LOW (ref 4.22–5.81)
RDW: 15.1 % (ref 11.5–15.5)
WBC: 14 K/uL — ABNORMAL HIGH (ref 4.0–10.5)
nRBC: 0 % (ref 0.0–0.2)

## 2024-08-18 LAB — ECHOCARDIOGRAM COMPLETE
Area-P 1/2: 2.81 cm2
S' Lateral: 3.4 cm

## 2024-08-18 LAB — MRSA NEXT GEN BY PCR, NASAL: MRSA by PCR Next Gen: NOT DETECTED

## 2024-08-18 LAB — PROCALCITONIN: Procalcitonin: 4.53 ng/mL

## 2024-08-18 MED ORDER — ASPIRIN 81 MG PO TBEC
81.0000 mg | DELAYED_RELEASE_TABLET | Freq: Every day | ORAL | Status: DC
  Administered 2024-08-18 – 2024-08-20 (×3): 81 mg via ORAL
  Filled 2024-08-18 (×3): qty 1

## 2024-08-18 MED ORDER — SODIUM CHLORIDE 0.9 % IV SOLN
500.0000 mg | Freq: Once | INTRAVENOUS | Status: AC
  Administered 2024-08-18: 500 mg via INTRAVENOUS
  Filled 2024-08-18: qty 5

## 2024-08-18 MED ORDER — ACETAMINOPHEN 500 MG PO TABS
1000.0000 mg | ORAL_TABLET | Freq: Once | ORAL | Status: AC
  Administered 2024-08-18: 1000 mg via ORAL
  Filled 2024-08-18: qty 2

## 2024-08-18 MED ORDER — ONDANSETRON HCL 4 MG/2ML IJ SOLN: 4.0000 mg | Freq: Four times a day (QID) | INTRAMUSCULAR | Status: DC | PRN

## 2024-08-18 MED ORDER — SODIUM CHLORIDE 0.9 % IV BOLUS
500.0000 mL | Freq: Once | INTRAVENOUS | Status: AC
  Administered 2024-08-18: 500 mL via INTRAVENOUS

## 2024-08-18 MED ORDER — SODIUM CHLORIDE 0.9 % IV SOLN
500.0000 mg | INTRAVENOUS | Status: DC
  Administered 2024-08-19 – 2024-08-20 (×2): 500 mg via INTRAVENOUS
  Filled 2024-08-18 (×2): qty 5

## 2024-08-18 MED ORDER — ACETAMINOPHEN 325 MG PO TABS: 650.0000 mg | ORAL_TABLET | Freq: Four times a day (QID) | ORAL | Status: DC | PRN

## 2024-08-18 MED ORDER — ONDANSETRON HCL 4 MG PO TABS: 4.0000 mg | ORAL_TABLET | Freq: Four times a day (QID) | ORAL | Status: DC | PRN

## 2024-08-18 MED ORDER — LEVOTHYROXINE SODIUM 25 MCG PO TABS
25.0000 ug | ORAL_TABLET | Freq: Every day | ORAL | Status: DC
  Administered 2024-08-19 – 2024-08-20 (×2): 25 ug via ORAL
  Filled 2024-08-18 (×2): qty 1

## 2024-08-18 MED ORDER — SODIUM CHLORIDE 0.9 % IV SOLN
1.0000 g | INTRAVENOUS | Status: DC
  Administered 2024-08-19 – 2024-08-20 (×2): 1 g via INTRAVENOUS
  Filled 2024-08-18 (×2): qty 10

## 2024-08-18 MED ORDER — ATORVASTATIN CALCIUM 40 MG PO TABS
80.0000 mg | ORAL_TABLET | Freq: Every day | ORAL | Status: DC
  Administered 2024-08-18 – 2024-08-20 (×3): 80 mg via ORAL
  Filled 2024-08-18 (×3): qty 2

## 2024-08-18 MED ORDER — SODIUM CHLORIDE 0.9 % IV SOLN
1.0000 g | Freq: Once | INTRAVENOUS | Status: AC
  Administered 2024-08-18: 1 g via INTRAVENOUS
  Filled 2024-08-18: qty 10

## 2024-08-18 MED ORDER — ACETAMINOPHEN 650 MG RE SUPP: 650.0000 mg | Freq: Four times a day (QID) | RECTAL | Status: DC | PRN

## 2024-08-18 NOTE — Progress Notes (Signed)
 Assuming care of patient from off-going RN. Agree w/ previously charted shift assessment and daily documentation. Will continue care until shift change- 1900.

## 2024-08-18 NOTE — Plan of Care (Signed)

## 2024-08-18 NOTE — ED Triage Notes (Signed)
 Pt came in from home via EMS w/ c/o of a fall. Pt woke up to go to bathroom and slid off bed onto the floor landing on butt. Denies hitting head, 0 thinners, no LOC. Hypotensive during triage, O2 between 89-90% on RA. Placed on 2L

## 2024-08-18 NOTE — ED Provider Notes (Signed)
 [place] EMERGENCY DEPARTMENT AT [facility] Provider Note   CSN: [id number] Arrival date & time: 08/18/24  9585     Patient presents with: Jason Noble   ORDEAN FOUTS is a 83 y.o. male.   The history is provided by the patient.  Fall This is a recurrent problem. The current episode started less than 1 hour ago. The problem occurs constantly. The problem has been resolved. Pertinent negatives include no chest pain, no abdominal pain, no headaches and no shortness of breath. Associated symptoms comments: Reports occasional productive cough . Nothing aggravates the symptoms. Nothing relieves the symptoms. He has tried nothing for the symptoms. The treatment provided no relief.      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    Basal cell carcinoma of skin    Broken jaw (HCC)    right lower    Cholecystitis    Coronary heart disease    has a stent   CVA (cerebral infarction) 2007   mild   Fatigue 07/26/2011   History of cancer chemotherapy    History of radiation therapy 06/18/2011- 08/03/2011   squamous cell tongue/69.96 Gy 20fx   Hypercholesteremia    under control   Hypertension    Myocardial infarction (HCC)    approx 2008 or 2009   Numbness    right lower    Numbness and tingling in left hand    numbess 2 fingers    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 Authorizing Provider  atorvastatin  (LIPITOR) 80 MG tablet TAKE ONE TABLET BY MOUTH ONCE A DAY 06/22/24   Pietro Redell RAMAN, MD  Coenzyme Q10 (CO Q 10 PO) Take 200 mg by mouth daily.    [provider]  ezetimibe  (ZETIA ) 10 MG tablet Take 1  tablet (10 mg total) by mouth daily. Patient not taking: Reported on 06/01/2024 01/16/24   Daneen Damien BROCKS, NP  LECITHIN CONCENTRATE PO Take by mouth. 2 times per week    [provider]  Multiple Vitamin (MULTIVITAMIN) capsule Take 1 capsule by mouth daily.    [provider]  Omega-3 Fatty Acids (OMEGA 3 PO) Take by mouth. 1 per day    [provider]  OVER THE COUNTER MEDICATION Take 20 mg by mouth daily. Lutigold    [provider]  OVER THE COUNTER MEDICATION Take 2 capsules by mouth daily.    [provider]  vitamin E 180 MG (400 UNITS) capsule Take 400 Units by mouth daily.    [provider]    Allergies: Patient has no known allergies.    Review of Systems  Constitutional:  Negative for fever.  Respiratory:  Positive for cough. Negative for shortness of breath.   Cardiovascular:  Negative for chest pain.  Gastrointestinal:  Negative for abdominal pain.  Neurological:  Negative for headaches.  All other systems reviewed and are negative.   Updated Vital Signs BP 97/61   Pulse 74   Temp 99.1 F (37.3 C) (Oral)  Resp 17   SpO2 93%   Physical Exam Vitals and nursing note reviewed.  Constitutional:      General: He is not in acute distress.    Appearance: Normal appearance. He is well-developed. He is not diaphoretic.  HENT:     Head: Normocephalic and atraumatic.     Nose: Nose normal.  Eyes:     Conjunctiva/sclera: Conjunctivae normal.     Pupils: Pupils are equal, round, and reactive to light.  Cardiovascular:     Rate and Rhythm: Normal rate and regular rhythm.     Pulses: Normal pulses.     Heart sounds: Normal heart sounds.  Pulmonary:     Effort: Pulmonary effort is normal.     Breath sounds: Rales present. No wheezing.  Abdominal:     General: Bowel sounds are normal.     Palpations: Abdomen is soft.     Tenderness: There is no abdominal tenderness. There is no guarding or rebound.  Musculoskeletal:         General: Normal range of motion.     Cervical back: Normal range of motion and neck supple.  Skin:    General: Skin is warm and dry.     Capillary Refill: Capillary refill takes less than 2 seconds.  Neurological:     General: No focal deficit present.     Mental Status: He is alert and oriented to person, place, and time.     Deep Tendon Reflexes: Reflexes normal.  Psychiatric:        Mood and Affect: Mood normal.     (all labs ordered are listed, but only abnormal results are displayed) Labs Reviewed - No data to display  EKG: None  Radiology: CT Head Wo Contrast Result Date: 08/18/2024 EXAM: CT HEAD WITHOUT CONTRAST 08/18/2024 06:00:44 AM TECHNIQUE: CT of the head was performed without the administration of intravenous contrast. Automated exposure control, iterative reconstruction, and/or weight based adjustment of the mA/kV was utilized to reduce the radiation dose to as low as reasonably achievable. COMPARISON: Brain MRI 04/27/2024. CLINICAL HISTORY: 83 year old male. Fall, Polytrauma, blunt. FINDINGS: BRAIN AND VENTRICLES: No acute hemorrhage. No evidence of acute infarct. No hydrocephalus. No extra-axial collection. No mass effect or midline shift. Chronic encephalomalacia left lateral inferior frontal gyrus again noted. Small area of cortical encephalomalacia at the right hemisphere vertex. Superimposed confluent bilateral periventricular and cerebral white matter hypodensity. Stable small chronic cerebellar infarcts, by CT more apparent in the left cerebellar hemisphere. Stable deep gray nuclei heterogeneity. Small area of cortical encephalomalacia at the right hemisphere vertex. Generalized intracranial artery tortuosity. Middle cerebral artery branch calcified atherosclerosis. ORBITS: No acute abnormality. SINUSES: Visible paranasal sinuses, middle ears and mastoids are well aerated. SOFT TISSUES AND SKULL: Calcified scalp vessel atherosclerosis. Calcified atherosclerosis at the  skull base. No acute soft tissue abnormality. No skull fracture. IMPRESSION: 1. No acute traumatic injury identified. 2. Stable non-contrast CT appearance of chronic ischemic disease. Electronically signed by: Helayne Hurst MD 08/18/2024 06:13 AM EST RP Workstation: [id number]   DG Chest Portable 1 View Result Date: 08/18/2024 EXAM: 1 VIEW(S) XRAY OF THE CHEST 08/18/2024 05:09:00 AM COMPARISON: 06/24/2023 CLINICAL HISTORY: 83 year old male. History of fall etc. FINDINGS: LUNGS AND PLEURA: Low lung volumes. Hazy opacity in left lower lung zone. No overt edema. No pleural effusion. No pneumothorax. HEART AND MEDIASTINUM: Atherosclerotic plaque. Surgical changes in mediastinum. Intact sternotomy wires. No acute abnormality of the cardiac and mediastinal silhouettes. BONES AND SOFT TISSUES: No acute  osseous abnormality. IMPRESSION: 1. Low lung volumes with hazy left lung base opacity, favor atelectasis. Electronically signed by: Helayne Hurst MD 08/18/2024 05:21 AM EST RP Workstation: [id number]   DG Pelvis Portable Result Date: 08/18/2024 EXAM: 1 or 2 VIEW(S) XRAY OF THE PELVIS 08/18/2024 05:08:00 AM COMPARISON: None available. CLINICAL HISTORY: falletc FINDINGS: BONES AND JOINTS: Left total hip arthroplasty noted. Moderate degenerative changes of the right hip. No acute fracture. No focal osseous lesion. No joint dislocation. SOFT TISSUES: Surgical clips in the pelvis noted. Overlying leads noted. Pelvic phleboliths. Vascular calcifications. IMPRESSION: 1. No acute findings. 2. Left total hip arthroplasty. Electronically signed by: Waddell Calk MD 08/18/2024 05:20 AM EST RP Workstation: [id number]     Procedures   Medications Ordered in the ED  acetaminophen  (TYLENOL ) tablet 1,000 mg (has no administration in time range)    Clinical Course as of 08/18/24 0716  Tue Aug 18, 2024  0659 Frequent falls, went to PT yesterday, fell, hypoxic and lower pressure, rales in LLL, HCAP, productive cough port score  129, no injuries from this fall  [LS]    Clinical Course User Index [LS] Rogelia Jerilynn RAMAN, MD                                 Medical Decision Making Patient with frequent falls   Amount and/or Complexity of Data Reviewed Independent Historian: EMS    Details: See above  External Data Reviewed: notes.    Details: Previous notes reviewed  Labs: ordered. Radiology: ordered and independent interpretation performed.    Details: No ICH on head CT, PNA by on CXR  Risk OTC drugs. Risk Details: Placed on O2 due to hypoxia.  Not known to be on O2 at home.  Has rales.  POERT score is 123.  Blood cultures ordered.  No recent hospitalization but is in PT for frequent falls.       Final diagnoses:  Fall, initial encounter   Signed out to Dr. Rogelia, pending  ED Discharge Orders     None          Katalin Colledge, MD 08/18/24 9277

## 2024-08-18 NOTE — ED Provider Notes (Signed)
  [place] EMERGENCY DEPARTMENT AT [facility] Provider Assume Care Note I assumed care of Jason Noble on 08/18/2024 at 7 AM from Dr. Palombo.   Briefly, Jason Noble is a 83 y.o. male who: PMHx: Coronary artery disease, prior stroke P/w fall after going to physical therapy yesterday, has had multiple recent falls Trauma imaging reassuring per prior provider, however patient found to be newly hypoxic and has low normotension as well as rales in the left lower lobe, accompanied by productive cough on exam concerning for pneumonia   Plan at the time of handoff: Labs, anticipate admission for pneumonia with hypoxic respiratory failure   Please refer to the original provider's note for additional information regarding the care of Jason Noble.  Reassessment: I personally reassessed the patient: Patient without any acute complaints or additional questions.  Vital Signs:  ED Triage Vitals  Encounter Vitals Group     BP 08/18/24 0425 94/60     Girls Systolic BP Percentile --      Girls Diastolic BP Percentile --      Boys Systolic BP Percentile --      Boys Diastolic BP Percentile --      Pulse Rate 08/18/24 0425 84     Resp 08/18/24 0430 17     Temp 08/18/24 0432 99.1 F (37.3 C)     Temp Source 08/18/24 0432 Oral     SpO2 08/18/24 0425 (!) 89 %     Weight --      Height --      Head Circumference --      Peak Flow --      Pain Score 08/18/24 0429 0     Pain Loc --      Pain Education --      Exclude from Growth Chart --      Hemodynamics:  The patient is hemodynamically stable. Mental Status:  The patient is alert  Additional MDM: Labs obtained, patient with leukocytosis to 14 consistent with pneumonia, metabolic panel at baseline.  Patient received azithromycin  and ceftriaxone .  Continues to require oxygen , therefore hospitalist consulted for hypoxic respiratory failure in the setting of pneumonia.  Discussed with Dr. Celinda who accepted the  patient to his service.  Disposition: ADMIT: I believe the patient requires admission for further care and management. The patient was admitted to hospitalists. Please see inpatient provider note for additional treatment plan details.    FREDRIK CANDIE Later, MD Emergency Medicine    Later Jerilynn RAMAN, MD 08/18/24 [phone]

## 2024-08-18 NOTE — Evaluation (Signed)
 Physical Therapy Evaluation Patient Details Name: Jason Noble MRN: [id number] DOB: 11/18/1940 Today's Date: 08/18/2024  History of Present Illness  Jason Noble is a 83 y.o. male comes to Ed after slideoff of his Bed. Chest xray favors atelectesis/low lung volumes, negative fractures of pelvis.EFY:rynozrbdupupd and an abscess of the gallbladder, thoracic aortic aneurysm, basal cell carcinoma of the skin, PFO, CAD, history of MI, history of stent placement, mild CVA, Skysona cell carcinoma of the tongue, oropharynx cancer, history of chemotherapy, history of radiation therapy, hyperlipidemia, hypertension, peripheral vascular disease, prostate cancer  Clinical Impression  Pt admitted with above diagnosis.  Pt currently with functional limitations due to the deficits listed below (see PT Problem List). Pt will benefit from acute skilled PT to increase their independence and safety with mobility to allow discharge.     Patient  reports that he had OPPT session and used higher weights. Stated he slid from his bed overnight. Patient has a H/O falls. Patient ambulated  in the room with Rw and CGA.Spo2 100% on RA. RN notified.  Patient  will benefit from continued OPPT if he demonstrates steady gait with RW prior to Dc .       If plan is discharge home, recommend the following: A little help with walking and/or transfers   Can travel by private vehicle        Equipment Recommendations None recommended by PT  Recommendations for Other Services  OT consult    Functional Status Assessment       Precautions / Restrictions Precautions Precautions: Fall Precaution/Restrictions Comments: monitor sats Restrictions Weight Bearing Restrictions Per Provider Order: No      Mobility  Bed Mobility Overal bed mobility: Needs Assistance Bed Mobility: Supine to Sit     Supine to sit: Supervision, HOB elevated     General bed mobility comments: no assistance    Transfers Overall  transfer level: Needs assistance Equipment used: Rolling walker (2 wheels) Transfers: Sit to/from Stand Sit to Stand: Contact guard assist           General transfer comment: cues for  safety    Ambulation/Gait Ambulation/Gait assistance: Contact guard assist Gait Distance (Feet): 40 Feet Assistive device: Rolling walker (2 wheels) Gait Pattern/deviations: Step-to pattern, Step-through pattern Gait velocity: decr     General Gait Details: able to manage around   objects  Stairs            Wheelchair Mobility     Tilt Bed    Modified Rankin (Stroke Patients Only)       Balance Overall balance assessment: Needs assistance, History of Falls Sitting-balance support: No upper extremity supported, Feet supported Sitting balance-Leahy Scale: Good     Standing balance support: During functional activity, Reliant on assistive device for balance, Bilateral upper extremity supported Standing balance-Leahy Scale: Fair                               Pertinent Vitals/Pain Pain Assessment Pain Assessment: No/denies pain    Home Living Family/patient expects to be discharged to:: Private residence Living Arrangements: Spouse/significant other Available Help at Discharge: Family;Available 24 hours/day Type of Home: House Home Access: Level entry       Home Layout: One level Home Equipment: Agricultural Consultant (2 wheels);Cane - single point;Shower seat Additional Comments: has been going to OPPT, wife unable to offer physical assistance    Prior Function Prior Level of Function : Independent/Modified Independent  Mobility Comments: furniture walks in house, uses rollator ADLs Comments: indep in B/D     Extremity/Trunk Assessment   Upper Extremity Assessment Upper Extremity Assessment: Generalized weakness         Cervical / Trunk Assessment Cervical / Trunk Assessment: Kyphotic  Communication   Communication Communication:  Impaired Factors Affecting Communication: Hearing impaired    Cognition Arousal: Alert Behavior During Therapy: WFL for tasks assessed/performed   PT - Cognitive impairments: Difficult to assess                       PT - Cognition Comments: pt attempted to get up without assist after being told to call Following commands: Intact       Cueing       General Comments      Exercises     Assessment/Plan    PT Assessment Patient needs continued PT services  PT Problem List Decreased strength;Decreased balance;Decreased knowledge of use of DME;Decreased safety awareness;Decreased knowledge of precautions       PT Treatment Interventions DME instruction;Gait training;Functional mobility training;Therapeutic activities;Therapeutic exercise;Balance training    PT Goals (Current goals can be found in the Care Plan section)  Acute Rehab PT Goals Patient Stated Goal: to go home PT Goal Formulation: With patient/family Time For Goal Achievement: 09/01/24 Potential to Achieve Goals: Good    Frequency Min 3X/week     Co-evaluation               AM-PAC PT 6 Clicks Mobility  Outcome Measure Help needed turning from your back to your side while in a flat bed without using bedrails?: None Help needed moving from lying on your back to sitting on the side of a flat bed without using bedrails?: None Help needed moving to and from a bed to a chair (including a wheelchair)?: A Little Help needed standing up from a chair using your arms (e.g., wheelchair or bedside chair)?: A Little Help needed to walk in hospital room?: A Little Help needed climbing 3-5 steps with a railing? : A Lot 6 Click Score: 19    End of Session Equipment Utilized During Treatment: Gait belt Activity Tolerance: Patient tolerated treatment well Patient left: in chair;with call bell/phone within reach;with chair alarm set Nurse Communication: Mobility status PT Visit Diagnosis: Unsteadiness on  feet (R26.81)    Time: 8384-8358 PT Time Calculation (min) (ACUTE ONLY): 26 min   Charges:   PT Evaluation $PT Eval Low Complexity: 1 Low PT Treatments $Gait Training: 8-22 mins PT General Charges $$ ACUTE PT VISIT: 1 Visit         Darice Potters PT Acute Rehabilitation Services Office [phone]   Potters Darice Norris 08/18/2024, 4:55 PM

## 2024-08-18 NOTE — H&P (Addendum)
 History and Physical    Patient: Jason Noble FMW:[id number] DOB: 1941-06-28 DOA: 08/18/2024 DOS: the patient was seen and examined on 08/18/2024 PCP: Arloa Elsie SAUNDERS, MD  Patient coming from: Home  Chief Complaint:  Chief Complaint  Patient presents with   Fall   HPI: Jason Noble is a 83 y.o. male with medical history significant of cholecystitis and an abscess of the gallbladder, thoracic aortic aneurysm, basal cell carcinoma of the skin, PFO, CAD, history of MI, history of stent placement, mild CVA, Skysona cell carcinoma of the tongue, oropharynx cancer, history of chemotherapy, history of radiation therapy, hyperlipidemia, hypertension, peripheral vascular disease, prostate cancer who was brought to the emergency department after he slighted off his bed and fell.  The patient stated he would crawl up and was trying to go to the bathroom when he fell and hit his gluteal area.  No prodromal symptoms, no LOC and no other complaints of trauma, including no head trauma.  He has been coughing, having dyspnea and generalized weakness.  His wife stated she recently caught a cold and is not sure if he got the same thing.  However, he seems to have a bacterial infection. He denied wheezing or hemoptysis.  No chest pain, palpitations, diaphoresis, PND, orthopnea or pitting edema of the lower extremities.  No abdominal pain, nausea, emesis, diarrhea, constipation, melena or hematochezia.  No flank pain, dysuria, frequency or hematuria.  No polyuria, polydipsia, polyphagia or blurred vision.   Lab work: CBC showed a white count of 14.0, hemoglobin 11.0 g/dL and platelets 838.  BMP showed normal electrolytes, glucose 105, BUN 35 creatinine 1.34 mg/dL.  Imaging: Pelvis x-ray with no acute findings.  Portable 1 view chest radiograph low lung volumes with hazy left lung base opacity favoring atelectasis.  CT head without contrast no acute traumatic injury identified.  Stable noncontrast CT  appearance of chronic ischemic disease.  There is also calcified atherosclerosis at the skull base with a skull vessel atherosclerosis.   ED course: Initial vital signs were temperature 99.1 F, pulse 84, respirations 17, BP 94/60 mmHg and O2 sat 89% on room air.  The patient received 500 mL of normal saline bolus, acetaminophen  1000 mg p.o. x 1, ceftriaxone  1 g IVPB and azithromycin  500 mg IVPB.  Review of Systems: As mentioned in the history of present illness. All other systems reviewed and are negative. 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    Basal cell carcinoma of skin    Broken jaw (HCC)    right lower    Cholecystitis    Coronary heart disease    has a stent   CVA (cerebral infarction) 2007   mild   Fatigue 07/26/2011   History of cancer chemotherapy    History of radiation therapy 06/18/2011- 08/03/2011   squamous cell tongue/69.96 Gy 65fx   Hypercholesteremia    under control   Hypertension    Myocardial infarction (HCC)    approx 2008 or 2009   Numbness    right lower    Numbness and tingling in left hand    numbess 2 fingers    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   Peripheral vascular disease    PFO (patent foramen ovale)    Prostate cancer (HCC) 2002   Skin cancer    basal cell, squamous cell   Squamous cell carcinoma of skin  Stroke (HCC)    appro in 08 or 09   Past Surgical History:  Procedure Laterality Date   CATARACT EXTRACTION W/ INTRAOCULAR LENS  IMPLANT, BILATERAL     CHOLECYSTECTOMY  04/2009   3 surgeries involved: lap chole x2 laparotomy   CORONARY ARTERY BYPASS GRAFT  1991   triple   DIRECT LARYNGOSCOPY N/A 02/17/2020   Procedure: DIRECT LARYNGOSCOPY WITH BIOPSY;  Surgeon: Jesus Oliphant, MD;  Location: [facility] OR;  Service: ENT;  Laterality: N/A;   ELBOW SURGERY  42 years ago   removal left radial head due to hockey accident   EXPLORATORY LAPAROTOMY  04/2010   Dr. Tanda  (3rd surgery for gall bladder)   INGUINAL HERNIA REPAIR Right 09/23/2014   Procedure: RIGHT INGUINAL HERNIA REPAIR WITH MESH;  Surgeon: Krystal Russell, MD;  Location: [facility] ORS;  Service: General;  Laterality: Right;   INGUINAL HERNIA REPAIR Left 09/30/2015   Procedure: HERNIA REPAIR LEFT INGUINAL ADULT;  Surgeon: Krystal Russell, MD;  Location: [facility] ORS;  Service: General;  Laterality: Left;   INSERTION OF MESH Left 09/30/2015   Procedure: INSERTION OF MESH;  Surgeon: Krystal Russell, MD;  Location: [facility] ORS;  Service: General;  Laterality: Left;   MULTIPLE TOOTH EXTRACTIONS     prior to radiation   PEG TUBE PLACEMENT  06/13/11 [facility]   10/2011 removed   PROSTATECTOMY  2002   TONSILLECTOMY     x2: as child then 1985 for Left lingual tonsil removed   TOTAL HIP ARTHROPLASTY Left 02/08/2016   Procedure: TOTAL HIP ARTHROPLASTY ANTERIOR APPROACH;  Surgeon: Dempsey Sensor, MD;  Location: [facility] OR;  Service: Orthopedics;  Laterality: Left;   ULNAR NERVE TRANSPOSITION Left 10/16/2018   Procedure: ULNAR NERVE DECOMPRESSION/TRANSPOSITION;  Surgeon: Dozier Soulier, MD;  Location: [facility] OR;  Service: Orthopedics;  Laterality: Left;   Social History:  reports that he quit smoking about 58 years ago. His smoking use included cigarettes. He started smoking about 63 years ago. He has a 1.3 pack-year smoking history. He has never used smokeless tobacco. He reports current alcohol use of about 1.0 standard drink of alcohol per week. He reports that he does not use drugs.  No Known Allergies  Family History  Problem Relation Age of Onset   Stroke Mother    Coronary artery disease Brother        had bypass    Prior to Admission medications   Medication Sig Start Date End Date Taking? Authorizing Provider  atorvastatin  (LIPITOR) 80 MG tablet TAKE ONE TABLET BY MOUTH ONCE A DAY 06/22/24   Pietro Redell RAMAN, MD  Coenzyme Q10 (CO Q 10 PO) Take 200 mg by mouth daily.    [provider]  ezetimibe  (ZETIA ) 10 MG tablet  Take 1 tablet (10 mg total) by mouth daily. Patient not taking: Reported on 06/01/2024 01/16/24   Daneen Damien BROCKS, NP  LECITHIN CONCENTRATE PO Take by mouth. 2 times per week    [provider]  Multiple Vitamin (MULTIVITAMIN) capsule Take 1 capsule by mouth daily.    [provider]  Omega-3 Fatty Acids (OMEGA 3 PO) Take by mouth. 1 per day    [provider]  OVER THE COUNTER MEDICATION Take 20 mg by mouth daily. Lutigold    [provider]  OVER THE COUNTER MEDICATION Take 2 capsules by mouth daily.    [provider]  vitamin E 180 MG (400 UNITS) capsule Take 400 Units by mouth daily.    [provider]    Physical Exam: Vitals:   08/18/24 0530 08/18/24 0645 08/18/24 0835 08/18/24 0900  BP: 97/61 102/63 108/65 (!) 95/56  Pulse: 74 73 64 73  Resp: 17 18 18 19   Temp:   98 F (36.7 C)   TempSrc:   Oral   SpO2: 93% 92% 94% 90%   Physical Exam Vitals and nursing note reviewed.  Constitutional:      General: He is awake. He is not in acute distress.    Appearance: Normal appearance. He is ill-appearing.     Interventions: Nasal cannula in place.  HENT:     Head: Normocephalic.     Nose: No rhinorrhea.     Mouth/Throat:     Mouth: Mucous membranes are moist.  Eyes:     General: No scleral icterus.    Pupils: Pupils are equal, round, and reactive to light.  Neck:     Vascular: No JVD.  Cardiovascular:     Rate and Rhythm: Normal rate and regular rhythm.     Heart sounds: S1 normal and S2 normal.  Pulmonary:     Effort: Pulmonary effort is normal.     Breath sounds: Rhonchi and rales present. No wheezing.  Abdominal:     General: Bowel sounds are normal. There is no distension.     Palpations: Abdomen is soft.     Tenderness: There is no abdominal tenderness. There is no right CVA tenderness or left CVA tenderness.  Musculoskeletal:     Cervical back: Neck supple.     Right lower leg: No edema.     Left lower leg: No  edema.  Skin:    General: Skin is warm and dry.  Neurological:     General: No focal deficit present.     Mental Status: He is alert and oriented to person, place, and time.  Psychiatric:        Mood and Affect: Mood normal.        Behavior: Behavior normal. Behavior is cooperative.     Data Reviewed:  Results are pending, will review when available. 04/10/2018 transthoracic echocardiogram report. Study Conclusions   - Left ventricle: The cavity size was normal. Wall thickness was    increased in a pattern of mild LVH. Systolic function was normal.    The estimated ejection fraction was in the range of 55% to 60%.    Wall motion was normal; there were no regional wall motion    abnormalities. Doppler parameters are consistent with abnormal    left ventricular relaxation (grade 1 diastolic dysfunction).  - Aortic root: The aortic root was moderately dilated.  - Mitral valve: Calcified annulus.  - Left atrium: The atrium was mildly dilated.  - Atrial septum: There was an atrial septal aneurysm.   Impressions:   - Normal LV systolic function; mild LVH; mild diastolic    dysfunction; moderately dilated aortic root (4.6 cm); suggest CTA    or MRA to further assess; mild LAE; atrial septal aneurysm.   Assessment and Plan: Principal Problem:   Acute respiratory failure with hypoxia (HCC) In the setting of:   CAP (community acquired pneumonia)  Admit to PCU/inpatient. Continue supplemental oxygen . Scheduled and as needed bronchodilators. Continue ceftriaxone 1 g IVPB daily. Continue azithromycin 500 mg IVPB daily. Check strep pneumoniae urinary antigen. Check sputum Gram stain, culture and sensitivity. Follow-up blood culture and sensitivity. Follow-up CBC and chemistry in the morning.  Active Problems:   Dyslipidemia Continue atorvastatin  80 mg p.o.  daily.    Essential hypertension Holding losartan  today. Will resume in AM. Antihypertensives as needed.    CAD  (coronary artery disease) Continue aspirin  and statin. Follow-up with cardiology as an outpatient.    Chronic kidney disease, stage 3a (HCC) Monitor renal function and electrolytes.    Normocytic anemia Monitor hematocrit hemoglobin. Check anemia panel in AM.    Hypothyroidism Continue levothyroxine 25 mcg p.o. daily.    Advance Care Planning:   Code Status: Full Code   Consults:   Family Communication: His wife was at bedside and his son was on the phone.  Severity of Illness: The appropriate patient status for this patient is INPATIENT. Inpatient status is judged to be reasonable and necessary in order to provide the required intensity of service to ensure the patient's safety. The patient's presenting symptoms, physical exam findings, and initial radiographic and laboratory data in the context of their chronic comorbidities is felt to place them at high risk for further clinical deterioration. Furthermore, it is not anticipated that the patient will be medically stable for discharge from the hospital within 2 midnights of admission.   * I certify that at the point of admission it is my clinical judgment that the patient will require inpatient hospital care spanning beyond 2 midnights from the point of admission due to high intensity of service, high risk for further deterioration and high frequency of surveillance required.*  Author: Alm Dorn Castor, MD 08/18/2024 9:37 AM  For on call review [email or web address].   This document was prepared using Dragon voice recognition software and may contain some unintended transcription errors.

## 2024-08-18 NOTE — Progress Notes (Signed)
  Echocardiogram 2D Echocardiogram has been performed.  Tinnie FORBES Gosling RDCS 08/18/2024, 2:01 PM

## 2024-08-19 DIAGNOSIS — J9601 Acute respiratory failure with hypoxia: Secondary | ICD-10-CM

## 2024-08-19 LAB — FERRITIN: Ferritin: 207 ng/mL (ref 24–336)

## 2024-08-19 LAB — RETICULOCYTES
Immature Retic Fract: 8.3 % (ref 2.3–15.9)
RBC.: 4.11 MIL/uL — ABNORMAL LOW (ref 4.22–5.81)
Retic Count, Absolute: 54.7 K/uL (ref 19.0–186.0)
Retic Ct Pct: 1.3 % (ref 0.4–3.1)

## 2024-08-19 LAB — CBC
HCT: 37.3 % — ABNORMAL LOW (ref 39.0–52.0)
Hemoglobin: 12 g/dL — ABNORMAL LOW (ref 13.0–17.0)
MCH: 29 pg (ref 26.0–34.0)
MCHC: 32.2 g/dL (ref 30.0–36.0)
MCV: 90.1 fL (ref 80.0–100.0)
Platelets: 201 K/uL (ref 150–400)
RBC: 4.14 MIL/uL — ABNORMAL LOW (ref 4.22–5.81)
RDW: 15.4 % (ref 11.5–15.5)
WBC: 18.9 K/uL — ABNORMAL HIGH (ref 4.0–10.5)
nRBC: 0 % (ref 0.0–0.2)

## 2024-08-19 LAB — COMPREHENSIVE METABOLIC PANEL WITH GFR
ALT: 21 U/L (ref 0–44)
AST: 70 U/L — ABNORMAL HIGH (ref 15–41)
Albumin: 3.7 g/dL (ref 3.5–5.0)
Alkaline Phosphatase: 78 U/L (ref 38–126)
Anion gap: 9 (ref 5–15)
BUN: 35 mg/dL — ABNORMAL HIGH (ref 8–23)
CO2: 27 mmol/L (ref 22–32)
Calcium: 9.4 mg/dL (ref 8.9–10.3)
Chloride: 102 mmol/L (ref 98–111)
Creatinine, Ser: 1.16 mg/dL (ref 0.61–1.24)
GFR, Estimated: >60 mL/min (ref >60)
Glucose, Bld: 80 mg/dL (ref 70–99)
Potassium: 4 mmol/L (ref 3.5–5.1)
Sodium: 138 mmol/L (ref 135–145)
Total Bilirubin: 1 mg/dL (ref 0.0–1.2)
Total Protein: 6.9 g/dL (ref 6.5–8.1)

## 2024-08-19 LAB — FOLATE: Folate: 9.7 ng/mL (ref >5.9)

## 2024-08-19 LAB — EXPECTORATED SPUTUM ASSESSMENT W GRAM STAIN, RFLX TO RESP C

## 2024-08-19 LAB — IRON AND TIBC
Iron: 14 ug/dL — ABNORMAL LOW (ref 45–182)
Saturation Ratios: 5 % — ABNORMAL LOW (ref 17.9–39.5)
TIBC: 295 ug/dL (ref 250–450)
UIBC: 282 ug/dL

## 2024-08-19 LAB — VITAMIN B12: Vitamin B-12: 792 pg/mL (ref 180–914)

## 2024-08-19 LAB — STREP PNEUMONIAE URINARY ANTIGEN: Strep Pneumo Urinary Antigen: NEGATIVE

## 2024-08-19 MED ORDER — LOSARTAN POTASSIUM 25 MG PO TABS
25.0000 mg | ORAL_TABLET | Freq: Every day | ORAL | Status: DC
  Administered 2024-08-19 – 2024-08-20 (×2): 25 mg via ORAL
  Filled 2024-08-19 (×2): qty 1

## 2024-08-19 NOTE — TOC Transition Note (Signed)
 Transition of Care [facility]) - Discharge Note   Patient Details  Name: Jason Noble MRN: [id number] Date of Birth: 1940/11/06  [facility] CM/SW Contact:  Bascom Service, RN Phone Number: 08/19/2024, 12:06 PM   Clinical Narrative: d/c home. Otpt PT @ [facility]. Has own transport home.      Final next level of care: OP Rehab Barriers to Discharge: No Barriers Identified   Patient Goals and CMS Choice   CMS Medicare.gov Compare Post Acute Care list provided to:: Patient Choice offered to / list presented to : Patient [place] ownership interest in [facility].provided to:: Patient    Discharge Placement                       Discharge Plan and Services Additional resources added to the After Visit Summary for                                       Social Drivers of Health (SDOH) Interventions SDOH Screenings   Food Insecurity: No Food Insecurity (08/18/2024)  Housing: Low Risk  (08/18/2024)  Transportation Needs: No Transportation Needs (08/18/2024)  Utilities: Not At Risk (08/18/2024)  Social Connections: Socially Integrated (08/18/2024)  Tobacco Use: Medium Risk (08/18/2024)     Readmission Risk Interventions     No data to display

## 2024-08-19 NOTE — Evaluation (Signed)
 Occupational Therapy Evaluation Patient Details Name: Jason Noble MRN: [id number] DOB: 11-11-1940 Today's Date: 08/19/2024   History of Present Illness   Jason Noble is a 83 yr old male admitted to the hospital after a fall out of bed. He was found to have possible CAP. 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 He is limited by the below listed deficits (see OT problem list). During the session today, he required CGA for most tasks, including sit to stand using a RW, teeth brushing in standing at sink level, and for functional ambulation using a RW. He will benefit from further OT services to maximize his independence with self-care tasks and to facilitate his safe return home. OT recommends he resume outpatient therapy once he discharges from the hospital.      If plan is discharge home, recommend the following:   Assist for transportation     Functional Status Assessment   Patient has had a recent decline in their functional status and demonstrates the ability to make significant improvements in function in a reasonable and predictable amount of time.     Equipment Recommendations   None recommended by OT     Recommendations for Other Services         Precautions/Restrictions   Precautions Precautions: Fall Restrictions Weight Bearing Restrictions Per Provider Order: No     Mobility Bed Mobility Overal bed mobility: Needs Assistance Bed Mobility: Supine to Sit     Supine to sit: Supervision, HOB elevated           Transfers Overall transfer level: Needs assistance Equipment used: Rolling walker (2 wheels) Transfers: Sit to/from Stand Sit to Stand: Contact guard assist                  Balance     Sitting balance-Leahy Scale: Good       Standing balance-Leahy Scale: Fair         ADL either performed or assessed with clinical judgement   ADL Overall ADL's : Needs assistance/impaired Eating/Feeding: Independent;Sitting   Grooming: Contact guard assist;Standing Grooming Details (indicate cue type and reason): He performed teeth brushing in standing at sink level. Upper Body Bathing: Set up;Sitting   Lower Body Bathing: Contact guard assist;Sit to/from stand   Upper Body Dressing : Set up;Sitting   Lower Body Dressing: Contact guard assist;Sit to/from stand   Toilet Transfer: Contact guard assist;Ambulation;Rolling walker (2 wheels)   Toileting- Clothing Manipulation and Hygiene: Contact guard assist;Sit to/from stand               Vision   Additional Comments: He correctly read the time depicted on the wall clock.            Pertinent Vitals/Pain Pain Assessment Pain Assessment: No/denies pain     Extremity/Trunk Assessment Upper Extremity Assessment Upper Extremity Assessment: Right hand dominant;RUE deficits/detail;LUE deficits/detail RUE Deficits / Details: AROM WFL. Grip strength 4+/5 LUE Deficits / Details: AROM WFL. Grip strength 4+/5   Lower Extremity Assessment Lower Extremity Assessment: Overall WFL for tasks assessed;RLE deficits/detail;LLE deficits/detail RLE Deficits / Details: AROM WFL LLE Deficits / Details: AROM WFL  Communication Communication Factors Affecting Communication: Hearing impaired   Cognition Arousal: Alert Behavior During Therapy: WFL for tasks assessed/performed               OT - Cognition Comments: Oriented x4            Following commands: Intact       Cueing  General Comments   Cueing  Techniques: Verbal cues              Home Living Family/patient expects to be discharged to:: Private residence Living Arrangements: Spouse/significant other Available Help at Discharge: Family Type of Home: House Home Access: Level entry     Home Layout: One level     Bathroom Shower/Tub: Producer, Television/film/video: Standard     Home Equipment: Shower seat;Cane - single point;Other (comment) (4 wheeled walker)          Prior Functioning/Environment Prior Level of Function : Independent/Modified Independent             Mobility Comments:  (He used a 4 wheeled walker as needed for ambulation.) ADLs Comments:  (He was largely independent with ADLs, except his spouse would occasionally provide min assist for managing buttons/zippers on clothing. His spouse performed the cooking and they shared household cleaning.)    OT Problem List: Decreased strength;Impaired balance (sitting and/or standing);Decreased knowledge of use of DME or AE   OT Treatment/Interventions: Self-care/ADL training;Therapeutic exercise;Energy conservation;DME and/or AE instruction;Therapeutic activities;Balance training;Patient/family education      OT Goals(Current goals can be found in the care plan section)   Acute Rehab OT Goals OT Goal Formulation: With patient Time For Goal Achievement: 09/02/24 Potential to Achieve Goals: Good ADL Goals Pt Will Perform Grooming: with modified independence;standing Pt Will Perform Lower Body Dressing: with modified independence;sit to/from stand Pt Will Transfer to Toilet: with modified independence;ambulating Pt Will Perform Toileting - Clothing Manipulation and hygiene: with modified independence;sit to/from stand   OT Frequency:  Min 2X/week       AM-PAC OT 6 Clicks Daily Activity     Outcome Measure Help from another person eating meals?: None Help from another person taking care of personal grooming?: A Little Help from another  person toileting, which includes using toliet, bedpan, or urinal?: A Little Help from another person bathing (including washing, rinsing, drying)?: A Little Help from another person to put on and taking off regular upper body clothing?: None Help from another person to put on and taking off regular lower body clothing?: A Little 6 Click Score: 20   End of Session Equipment Utilized During Treatment: Rolling walker (2 wheels);Gait belt Nurse Communication: Mobility status  Activity Tolerance: Patient tolerated treatment well Patient left: in chair;with call bell/phone within reach;with chair alarm set;with family/visitor present  OT Visit Diagnosis: Other abnormalities of gait and mobility (R26.89);Muscle weakness (generalized) (M62.81)                Time: 8952-8882 OT Time Calculation (min): 30 min Charges:  OT General Charges $OT Visit: 1 Visit OT Evaluation $OT Eval Low Complexity: 1 Low OT Treatments $Therapeutic Activity: 8-22 mins    Delanna JINNY Lesches, OTR/L 08/19/2024, 2:26 PM

## 2024-08-19 NOTE — Progress Notes (Signed)
 Mobility Specialist - Progress Note   08/19/24 1430  Mobility  Activity Ambulated with assistance  Level of Assistance Contact guard assist, steadying assist  Assistive Device Front wheel walker  Distance Ambulated (ft) 100 ft  Range of Motion/Exercises Active  Activity Response Tolerated well  Mobility visit 1 Mobility  Mobility Specialist Start Time (ACUTE ONLY) 1410  Mobility Specialist Stop Time (ACUTE ONLY) 1430  Mobility Specialist Time Calculation (min) (ACUTE ONLY) 20 min   Pt was found in bathroom and agreeable to mobilize afterwards. No complaints. At EOS returned to bed with all needs met. Call bell in reach.   Jason Noble,  Mobility Specialist Can be reached via Secure Chat

## 2024-08-19 NOTE — Progress Notes (Signed)
 PROGRESS NOTE    Clete Kuch Styles  FMW:[id number] DOB: [birth year] DOA: 08/18/2024 PCP: Arloa Elsie SAUNDERS, MD    Brief Narrative:   Jason Noble is a 83 y.o. 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  Patient reports woke up to go to the bathroom and slid off the bed onto the floor landing on his buttocks.  Denies loss of consciousness or striking his head.  Spouse reports that she recently caught a cold and is not sure if he has the same thing.  Patient denied any wheezing, no hemoptysis, no chest pain, no palpitations, no diaphoresis, no lower extremity edema, no abdominal pain, no nausea/vomiting/diarrhea, no urinary symptoms.  In the ED, temperature 99.1 F, HR 85, RR 17, BP 100/63, SpO2 84% on room air was placed on nasal cannula.  WBC 14.0, hemoglobin 161, platelet count 161.  Sodium 138, potassium 3.7, chloride 104, CO2 24, glucose 105, BUN 35, creat 1.34.  Procalcitonin 4.53.  CT head without contrast with no acute traumatic injury identified, stable noncontrast CT appearance of the chronic ischemic disease.  Pelvis x-ray with no acute findings, noted left total hip arthroplasty.  Chest x-ray with low lung zones with hazy left lung base opacity. The patient received 500 mL of normal saline bolus, acetaminophen  1000 mg p.o. x 1, ceftriaxone 1 g IVPB and azithromycin 500 mg IVPB.  TRH consulted for admission for further evaluation and management of fall, community-acquired pneumonia.  Assessment & Plan:   Community-acquired pneumonia Acute hypoxemic respiratory failure, POA Patient  presenting to ED after fall at home.  Was noted to be hypoxic with SpO2 84% on room air at time of arrival placed on nasal cannula.  WBC elevated 14.0.  Chest x-ray with hazy left lung base opacity consistent with pneumonia. 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 Lauralee at [facility].   HTN -- Losartan  25 mg p.o. daily -- Aspirin  and statin  HLD CAD s/p PCI/CABG -- Aspirin  81 mg p.o. daily -- Atorvastatin  80 mg p.o. daily  Hypothyroidism -- Levothyroxine 25 mcg p.o. daily  Mechanical fall -- PT recommending outpatient PT -- OT evaluation: Pending  DVT prophylaxis: SCDs Start: 08/18/24 1025    Code Status: Full Code Family Communication: No family present at bedside this morning  Disposition Plan:  Level of care: Progressive Status is: Inpatient Remains inpatient appropriate because: IV antibiotics    Consultants:  None  Procedures:  None  Antimicrobials:  Azithromycin 12/2>> Ceftriaxone 12/1>>   Subjective: Patient seen examined bedside, lying in bed.  Eating breakfast.  Reports has some difficulty with swallowing but he  has adapted given his past surgical/oncological history.  Remains on IV antibiotics, on room air with SpO2 95% at rest.  No other specific questions, concerns or complaints at this time.  Denies headache, no dizziness, no chest pain, no palpitations, no abdominal pain, no fever/chills/night sweats,  no nausea/vomiting/diarrhea, no focal weakness, no fatigue, no paresthesias.  No acute events overnight per nursing staff.  Objective: Vitals:   08/18/24 1851 08/18/24 1909 08/18/24 2128 08/19/24 0449  BP:  100/66 126/84 137/84  Pulse:  65 67 66  Resp:  18 19 20   Temp:  98.3 F (36.8 C) 97.9 F (36.6 C) 98.1 F (36.7 C)  TempSrc:  Oral Oral Oral  SpO2:  97% 98% 95%  Weight: 77.5 kg     Height: 5' 9 (1.753 m)       Intake/Output Summary (Last 24 hours) at 08/19/2024 1209 Last data filed at 08/19/2024 0807 Gross per 24 hour  Intake 531.08 ml  Output --  Net 531.08 ml   Filed Weights   08/18/24 1851  Weight: 77.5 kg    Examination:  Physical Exam: GEN: NAD, alert and oriented x 3, chronically ill in appearance HEENT: NCAT, PERRL, EOMI, sclera clear, MMM, left parotid nodule NTTP PULM: Breath sounds slightly diminished lower bases without wheezes/crackles, normal respiratory effort without accessory muscle use, on room air with SpO2 95% at rest CV: RRR w/o M/G/R GI: abd soft, NTND, + BS MSK: no peripheral edema, muscle strength globally intact 5/5 bilateral upper/lower extremities NEURO: CN II-XII intact, no focal deficits, sensation to light touch intact PSYCH: normal mood/affect Integumentary: Left parotid lesion/nodule, nontender to palpation, otherwise no other concerning rashes/lesions/wounds noted on exposed skin surfaces    Data Reviewed: I have personally reviewed following labs and imaging studies  CBC: Recent Labs  Lab 08/18/24 0643 08/19/24 0438  WBC 14.0* 18.9*  NEUTROABS 12.6*  --   HGB 11.0* 12.0*  HCT 34.6* 37.3*  MCV 89.6 90.1  PLT 161 201   Basic Metabolic Panel: Recent Labs  Lab 08/18/24 0643 08/19/24 0438  NA 138 138  K 3.7 4.0  CL 104 102  CO2 24 27  GLUCOSE 105* 80  BUN 35* 35*  CREATININE 1.34* 1.16  CALCIUM  9.4 9.4   GFR: Estimated Creatinine Clearance: 48.3 mL/min (by C-G formula based on SCr of 1.16 mg/dL). Liver Function  Tests: Recent Labs  Lab 08/19/24 0438  AST 70*  ALT 21  ALKPHOS 78  BILITOT 1.0  PROT 6.9  ALBUMIN 3.7   No results for input(s): LIPASE, AMYLASE in the last 168 hours. No results for input(s): AMMONIA in the last 168 hours. Coagulation Profile: No results for input(s): INR, PROTIME in the last 168 hours. Cardiac Enzymes: No results for input(s): CKTOTAL, CKMB, CKMBINDEX, TROPONINI in the last 168 hours. BNP (last 3 results) No results for input(s): PROBNP in the last 8760 hours. HbA1C: No results for input(s): HGBA1C in the last 72 hours. CBG: No results for input(s): GLUCAP in the last 168 hours. Lipid Profile: No results for input(s): CHOL, HDL, LDLCALC, TRIG, CHOLHDL, LDLDIRECT in the last 72 hours. Thyroid  Function Tests: No results for input(s): TSH, T4TOTAL, FREET4, T3FREE, THYROIDAB in the last 72 hours. Anemia Panel: Recent Labs    08/19/24 0438  VITAMINB12 792  FOLATE 9.7  FERRITIN 207  TIBC 295  IRON 14*  RETICCTPCT 1.3   Sepsis Labs: Recent Labs  Lab 08/18/24 0703  PROCALCITON 4.53    Recent Results (from the past 240  hours)  Blood culture (routine x 2)     Status: None (Preliminary result)   Collection Time: 08/18/24  6:45 AM   Specimen: BLOOD  Result Value Ref Range Status   Specimen Description   Final    BLOOD BLOOD RIGHT FOREARM Performed at [facility], [address] [place], [place] [address]    Special Requests   Final    BOTTLES DRAWN AEROBIC AND ANAEROBIC Blood Culture adequate volume Performed at [facility], [address], [place], [place] [address]    Culture   Final    NO GROWTH < 24 HOURS Performed at [facility], [address] [place], [place] [address]    Report Status PENDING  Incomplete  Blood culture (routine x 2)     Status: None (Preliminary result)   Collection Time: 08/18/24  6:52 AM   Specimen: BLOOD  Result Value Ref  Range Status   Specimen Description   Final    BLOOD BLOOD LEFT FOREARM Performed at [facility], [address], [place], [place] [address]    Special Requests   Final    BOTTLES DRAWN AEROBIC AND ANAEROBIC Blood Culture adequate volume Performed at [facility], [address] [place], [place] [address]    Culture   Final    NO GROWTH < 24 HOURS Performed at [facility], [address] [place], [place] [address]    Report Status PENDING  Incomplete  MRSA Next Gen by PCR, Nasal     Status: None   Collection Time: 08/18/24  4:13 PM   Specimen: Nasal Mucosa; Nasal Swab  Result Value Ref Range Status   MRSA by PCR Next Gen NOT DETECTED NOT DETECTED Final    Comment: (NOTE) The GeneXpert MRSA Assay (FDA approved for NASAL specimens only), is one component of a comprehensive MRSA colonization surveillance program. It is not intended to diagnose MRSA infection nor to guide or monitor treatment for MRSA infections. Test performance is not FDA approved in patients less than 19 years old. Performed at [facility], [address] [place], [place] [address]   Expectorated Sputum Assessment w Gram Stain, Rflx to Resp Cult     Status: None   Collection Time: 08/18/24  6:57 PM   Specimen: Expectorated Sputum  Result Value Ref Range Status   Specimen Description EXPECTORATED SPUTUM  Final   Special Requests NONE  Final   Sputum evaluation   Final    THIS SPECIMEN IS ACCEPTABLE FOR SPUTUM CULTURE Performed at [facility], [address], [place], [place] [address]    Report Status 08/19/2024 FINAL  Final  Culture, Respiratory w Gram Stain     Status: None (Preliminary result)   Collection Time: 08/18/24  6:57 PM  Result Value Ref Range Status   Specimen Description   Final    EXPECTORATED SPUTUM Performed at [facility], [address], [place], [place] [address]    Special  Requests   Final    NONE Reflexed from [id number] Performed at [facility], [address] [place], [place] [address]    Gram Stain   Final    RARE WBC PRESENT, PREDOMINANTLY PMN NO ORGANISMS SEEN Performed at [facility], [address], [place], [place] [address]    Culture PENDING  Incomplete   Report Status PENDING  Incomplete         Radiology  Studies: ECHOCARDIOGRAM COMPLETE Result Date: 08/18/2024    ECHOCARDIOGRAM REPORT   Patient Name:   KAYDYN SAYAS Espinola Date of Exam: 08/18/2024 Medical Rec #:  [id number]          Height:       67.0 in Accession #:    [id number]         Weight:       163.0 lb Date of Birth:  14-Aug-1941          BSA:          1.854 m Patient Age:    83 years           BP:           133/80 mmHg Patient Gender: M                  HR:           62 bpm. Exam Location:  Inpatient Procedure: 2D Echo, Color Doppler and Cardiac Doppler (Both Spectral and Color            Flow Doppler were utilized during procedure). Indications:    CAD I25.10  History:        Patient has prior history of Echocardiogram examinations, most                 recent 04/10/2018. CAD and Previous Myocardial Infarction; Risk                 Factors:Hypertension.  Sonographer:    Tinnie Gosling RDCS Referring Phys: [phone] DAVID MANUEL ORTIZ IMPRESSIONS  1. Left ventricular ejection fraction, by estimation, is 50 to 55%. The left ventricle has low normal function. The left ventricle has no regional wall motion abnormalities. Left ventricular diastolic parameters are indeterminate.  2. Right ventricular systolic function is normal. The right ventricular size is normal. Tricuspid regurgitation signal is inadequate for assessing PA pressure.  3. The mitral valve is normal in structure. No evidence of mitral valve regurgitation. No evidence of mitral stenosis.  4. The aortic valve is normal in structure. Aortic valve regurgitation is not visualized. No aortic stenosis is present.  5. The  inferior vena cava is normal in size with greater than 50% respiratory variability, suggesting right atrial pressure of 3 mmHg. FINDINGS  Left Ventricle: Left ventricular ejection fraction, by estimation, is 50 to 55%. The left ventricle has low normal function. The left ventricle has no regional wall motion abnormalities. The left ventricular internal cavity size was normal in size. There is no left ventricular hypertrophy. Left ventricular diastolic parameters are indeterminate. Right Ventricle: The right ventricular size is normal. No increase in right ventricular wall thickness. Right ventricular systolic function is normal. Tricuspid regurgitation signal is inadequate for assessing PA pressure. Left Atrium: Left atrial size was normal in size. Right Atrium: Right atrial size was normal in size. Pericardium: There is no evidence of pericardial effusion. Mitral Valve: The mitral valve is normal in structure. No evidence of mitral valve regurgitation. No evidence of mitral valve stenosis. Tricuspid Valve: The tricuspid valve is normal in structure. Tricuspid valve regurgitation is not demonstrated. No evidence of tricuspid stenosis. Aortic Valve: The aortic valve is normal in structure. Aortic valve regurgitation is not visualized. No aortic stenosis is present. Pulmonic Valve: The pulmonic valve was normal in structure. Pulmonic valve regurgitation is not visualized. No evidence of pulmonic stenosis. Aorta: The aortic root is normal in size and structure. Venous: The inferior vena cava is normal in size  with greater than 50% respiratory variability, suggesting right atrial pressure of 3 mmHg. IAS/Shunts: No atrial level shunt detected by color flow Doppler.  LEFT VENTRICLE PLAX 2D LVIDd:         4.70 cm   Diastology LVIDs:         3.40 cm   LV e' medial:    5.00 cm/s LV PW:         1.30 cm   LV E/e' medial:  8.1 LV IVS:        1.30 cm   LV e' lateral:   4.90 cm/s LVOT diam:     2.20 cm   LV E/e' lateral: 8.2 LV  SV:         60 LV SV Index:   33 LVOT Area:     3.80 cm LV IVRT:       153 msec  RIGHT VENTRICLE TAPSE (M-mode): 1.7 cm  PULMONARY VEINS                         Diastolic Velocity: 22.40 cm/s                         S/D Velocity:       1.50                         Systolic Velocity:  34.10 cm/s LEFT ATRIUM             Index LA diam:        3.50 cm 1.89 cm/m LA Vol (A2C):   63.6 ml 34.31 ml/m LA Vol (A4C):   41.6 ml 22.44 ml/m LA Biplane Vol: 60.1 ml 32.42 ml/m  AORTIC VALVE LVOT Vmax:   73.10 cm/s LVOT Vmean:  47.900 cm/s LVOT VTI:    0.159 m  AORTA Ao Root diam: 4.90 cm Ao Asc diam:  4.20 cm MITRAL VALVE MV Area (PHT): 2.81 cm    SHUNTS MV Decel Time: 270 msec    Systemic VTI:  0.16 m MV E velocity: 40.30 cm/s  Systemic Diam: 2.20 cm MV A velocity: 97.30 cm/s MV E/A ratio:  0.41 Kardie Tobb DO Electronically signed by Dub Huntsman DO Signature Date/Time: 08/18/2024/2:22:49 PM    Final    CT Head Wo Contrast Result Date: 08/18/2024 EXAM: CT HEAD WITHOUT CONTRAST 08/18/2024 06:00:44 AM TECHNIQUE: CT of the head was performed without the administration of intravenous contrast. Automated exposure control, iterative reconstruction, and/or weight based adjustment of the mA/kV was utilized to reduce the radiation dose to as low as reasonably achievable. COMPARISON: Brain MRI 04/27/2024. CLINICAL HISTORY: 83 year old male. Fall, Polytrauma, blunt. FINDINGS: BRAIN AND VENTRICLES: No acute hemorrhage. No evidence of acute infarct. No hydrocephalus. No extra-axial collection. No mass effect or midline shift. Chronic encephalomalacia left lateral inferior frontal gyrus again noted. Small area of cortical encephalomalacia at the right hemisphere vertex. Superimposed confluent bilateral periventricular and cerebral white matter hypodensity. Stable small chronic cerebellar infarcts, by CT more apparent in the left cerebellar hemisphere. Stable deep gray nuclei heterogeneity. Small area of cortical encephalomalacia at the  right hemisphere vertex. Generalized intracranial artery tortuosity. Middle cerebral artery branch calcified atherosclerosis. ORBITS: No acute abnormality. SINUSES: Visible paranasal sinuses, middle ears and mastoids are well aerated. SOFT TISSUES AND SKULL: Calcified scalp vessel atherosclerosis. Calcified atherosclerosis at the skull base. No acute soft tissue abnormality. No skull fracture. IMPRESSION: 1. No  acute traumatic injury identified. 2. Stable non-contrast CT appearance of chronic ischemic disease. Electronically signed by: Helayne Hurst MD 08/18/2024 06:13 AM EST RP Workstation: [id number]   DG Chest Portable 1 View Result Date: 08/18/2024 EXAM: 1 VIEW(S) XRAY OF THE CHEST 08/18/2024 05:09:00 AM COMPARISON: 06/24/2023 CLINICAL HISTORY: 83 year old male. History of fall etc. FINDINGS: LUNGS AND PLEURA: Low lung volumes. Hazy opacity in left lower lung zone. No overt edema. No pleural effusion. No pneumothorax. HEART AND MEDIASTINUM: Atherosclerotic plaque. Surgical changes in mediastinum. Intact sternotomy wires. No acute abnormality of the cardiac and mediastinal silhouettes. BONES AND SOFT TISSUES: No acute osseous abnormality. IMPRESSION: 1. Low lung volumes with hazy left lung base opacity, favor atelectasis. Electronically signed by: Helayne Hurst MD 08/18/2024 05:21 AM EST RP Workstation: [id number]   DG Pelvis Portable Result Date: 08/18/2024 EXAM: 1 or 2 VIEW(S) XRAY OF THE PELVIS 08/18/2024 05:08:00 AM COMPARISON: None available. CLINICAL HISTORY: falletc FINDINGS: BONES AND JOINTS: Left total hip arthroplasty noted. Moderate degenerative changes of the right hip. No acute fracture. No focal osseous lesion. No joint dislocation. SOFT TISSUES: Surgical clips in the pelvis noted. Overlying leads noted. Pelvic phleboliths. Vascular calcifications. IMPRESSION: 1. No acute findings. 2. Left total hip arthroplasty. Electronically signed by: Waddell Calk MD 08/18/2024 05:20 AM EST RP Workstation:  [id number]        Scheduled Meds:  aspirin  EC  81 mg Oral Daily   atorvastatin   80 mg Oral Daily   levothyroxine  25 mcg Oral Q0600   Continuous Infusions:  azithromycin 500 mg (08/19/24 0853)   cefTRIAXone (ROCEPHIN)  IV 1 g (08/19/24 0544)     LOS: 1 day    Time spent: 51 minutes spent on 08/19/2024 caring for this patient face-to-face including chart review, ordering labs/tests, documenting, discussion with nursing staff, consultants, updating family and interview/physical exam    Camellia PARAS Cartel Mauss, DO [facility] Available via Epic secure chat 7am-7pm After these hours, please refer to coverage provider listed on [email or web address] 08/19/2024, 12:09 PM

## 2024-08-19 NOTE — Plan of Care (Signed)
  Problem: Education: Goal: Knowledge of General Education information will improve Description: Including pain rating scale, medication(s)/side effects and non-pharmacologic comfort measures Outcome: Progressing   Problem: Clinical Measurements: Goal: Ability to maintain clinical measurements within normal limits will improve Outcome: Progressing   Problem: Clinical Measurements: Goal: Diagnostic test results will improve Outcome: Progressing   

## 2024-08-20 ENCOUNTER — Other Ambulatory Visit (HOSPITAL_COMMUNITY): Payer: Self-pay

## 2024-08-20 DIAGNOSIS — J9601 Acute respiratory failure with hypoxia: Secondary | ICD-10-CM | POA: Diagnosis not present

## 2024-08-20 LAB — CBC
HCT: 34.5 % — ABNORMAL LOW (ref 39.0–52.0)
Hemoglobin: 10.9 g/dL — ABNORMAL LOW (ref 13.0–17.0)
MCH: 28.2 pg (ref 26.0–34.0)
MCHC: 31.6 g/dL (ref 30.0–36.0)
MCV: 89.1 fL (ref 80.0–100.0)
Platelets: 170 K/uL (ref 150–400)
RBC: 3.87 MIL/uL — ABNORMAL LOW (ref 4.22–5.81)
RDW: 15.4 % (ref 11.5–15.5)
WBC: 9.6 K/uL (ref 4.0–10.5)
nRBC: 0 % (ref 0.0–0.2)

## 2024-08-20 LAB — COMPREHENSIVE METABOLIC PANEL WITH GFR
ALT: 15 U/L (ref 0–44)
AST: 55 U/L — ABNORMAL HIGH (ref 15–41)
Albumin: 3.1 g/dL — ABNORMAL LOW (ref 3.5–5.0)
Alkaline Phosphatase: 72 U/L (ref 38–126)
Anion gap: 9 (ref 5–15)
BUN: 31 mg/dL — ABNORMAL HIGH (ref 8–23)
CO2: 24 mmol/L (ref 22–32)
Calcium: 9 mg/dL (ref 8.9–10.3)
Chloride: 104 mmol/L (ref 98–111)
Creatinine, Ser: 1.05 mg/dL (ref 0.61–1.24)
GFR, Estimated: >60 mL/min (ref >60)
Glucose, Bld: 96 mg/dL (ref 70–99)
Potassium: 4.2 mmol/L (ref 3.5–5.1)
Sodium: 137 mmol/L (ref 135–145)
Total Bilirubin: 0.5 mg/dL (ref 0.0–1.2)
Total Protein: 6 g/dL — ABNORMAL LOW (ref 6.5–8.1)

## 2024-08-20 LAB — PROCALCITONIN: Procalcitonin: 4.73 ng/mL

## 2024-08-20 MED ORDER — LEVOFLOXACIN 750 MG PO TABS
750.0000 mg | ORAL_TABLET | Freq: Every day | ORAL | 0 refills | Status: AC
  Filled 2024-08-20: qty 7, 7d supply, fill #0

## 2024-08-20 NOTE — Plan of Care (Signed)
  Problem: Clinical Measurements: Goal: Will remain free from infection Outcome: Adequate for Discharge   Problem: Clinical Measurements: Goal: Diagnostic test results will improve Outcome: Adequate for Discharge   Problem: Clinical Measurements: Goal: Respiratory complications will improve Outcome: Adequate for Discharge   Problem: Clinical Measurements: Goal: Cardiovascular complication will be avoided Outcome: Adequate for Discharge   Problem: Safety: Goal: Ability to remain free from injury will improve Outcome: Adequate for Discharge

## 2024-08-20 NOTE — Progress Notes (Signed)
 Discharge instructions reviewed with patient and spouse. All questions answered. All belongings accounted for. Patient to follow up with MD in  1 week. Patient medications hand delivered from outpatient pharmacy. PIV removed x2.  Assisted via WC to private vehicle.

## 2024-08-20 NOTE — Evaluation (Signed)
 Clinical/Bedside Swallow Evaluation Patient Details  Name: SELMA RODELO MRN: [id number] Date of Birth: 09/02/1941  Today's Date: 08/20/2024 Time: SLP Start Time (ACUTE ONLY): 9164 SLP Stop Time (ACUTE ONLY): 0910 SLP Time Calculation (min) (ACUTE ONLY): 35 min  Past Medical History:  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    Basal cell carcinoma of skin    Broken jaw (HCC)    right lower    Cholecystitis    Coronary heart disease    has a stent   CVA (cerebral infarction) 2007   mild   Fatigue 07/26/2011   History of cancer chemotherapy    History of radiation therapy 06/18/2011- 08/03/2011   squamous cell tongue/69.96 Gy 104fx   Hypercholesteremia    under control   Hypertension    Myocardial infarction (HCC)    approx 2008 or 2009   Numbness    right lower    Numbness and tingling in left hand    numbess 2 fingers    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   Peripheral vascular disease    PFO (patent foramen ovale)    Prostate cancer (HCC) 2002   Skin cancer    basal cell, squamous cell   Squamous cell carcinoma of skin    Stroke (HCC)    appro in 08 or 09   Past Surgical History:  Past Surgical History:  Procedure Laterality Date   CATARACT EXTRACTION W/ INTRAOCULAR LENS  IMPLANT, BILATERAL     CHOLECYSTECTOMY  04/2009   3 surgeries involved: lap chole x2 laparotomy   CORONARY ARTERY BYPASS GRAFT  1991   triple   DIRECT LARYNGOSCOPY N/A 02/17/2020   Procedure: DIRECT LARYNGOSCOPY WITH BIOPSY;  Surgeon: Jesus Oliphant, MD;  Location: [facility] OR;  Service: ENT;  Laterality: N/A;   ELBOW SURGERY  42 years ago   removal left radial head due to hockey accident   EXPLORATORY LAPAROTOMY  04/2010   Dr. Tanda (3rd surgery for gall bladder)   INGUINAL HERNIA REPAIR Right 09/23/2014   Procedure: RIGHT INGUINAL HERNIA REPAIR WITH MESH;  Surgeon: Krystal Russell, MD;  Location: [facility] ORS;  Service: General;   Laterality: Right;   INGUINAL HERNIA REPAIR Left 09/30/2015   Procedure: HERNIA REPAIR LEFT INGUINAL ADULT;  Surgeon: Krystal Russell, MD;  Location: [facility] ORS;  Service: General;  Laterality: Left;   INSERTION OF MESH Left 09/30/2015   Procedure: INSERTION OF MESH;  Surgeon: Krystal Russell, MD;  Location: [facility] ORS;  Service: General;  Laterality: Left;   MULTIPLE TOOTH EXTRACTIONS     prior to radiation   PEG TUBE PLACEMENT  06/13/11 at [facility]   10/2011 removed   PROSTATECTOMY  2002   TONSILLECTOMY     x2: as child then 1985 for Left lingual tonsil removed   TOTAL HIP ARTHROPLASTY Left 02/08/2016   Procedure: TOTAL HIP ARTHROPLASTY ANTERIOR APPROACH;  Surgeon: Dempsey Sensor, MD;  Location: [facility] OR;  Service: Orthopedics;  Laterality: Left;   ULNAR NERVE TRANSPOSITION Left 10/16/2018   Procedure: ULNAR NERVE DECOMPRESSION/TRANSPOSITION;  Surgeon: Dozier Soulier, MD;  Location: [facility] OR;  Service: Orthopedics;  Laterality: Left;   HPI:  Per MD notes DOYCE SALING is a 83 y.o. 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  Patient reports woke up to go to the bathroom and slid off the bed onto the floor landing on his buttocks.  Denies loss of consciousness or striking his head.  Spouse reports that she recently caught a cold and is not sure if he has the same thing.  Patient denied any wheezing, no hemoptysis, no chest pain, no palpitations, no diaphoresis, no lower extremity edema, no abdominal pain, no nausea/vomiting/diarrhea, no urinary symptoms.  CXR Low lung volumes with hazy left lung base opacity,  favor atelectasis.  Pthas undergone prior FEES study at [facility] that showed some silent aspiration of thin liquids due to iatrogenic effects of XRT.   He also endorses some weight loss -stating his pants are looser than normal.  He denies having had pneumonias - until recently.  FEES study showed silent aspiration of thin during the swallow - and pt has been maintained on reg/thin diet. He denies requiring heimlich manuever and states he can manage his pills easily - several at one time if they are small.  Pt is very good at conducting oral care, using water  pic - and he report he is going to start conducting swallowing exercises provided by his OP SLP.  Pt reports part of his epiglottis was removed.    Assessment / Plan / Recommendation  Clinical Impression  Patient demonstrates clinical indication of dysphagia - due to iatrgenic effects of head and neck cancer treatment. His throat is pliable per palpation fortuantely.  He is observed consuming some of his breakfast and is noted to cough and clear his throat- more so post-liquid swallow- Does not expectorate - but attempts - and advised he has expectorated secretions into cup for testing.  Suspect he has normalized his coughing - as he has decreased awareness to this phenomenon - denying it occuring during session.  This is indication of chronicity of dysphagia - for which he has been managing. Most recent FEES study was conducted at [facility] and pt is seeing OP SLP via [facility]. FEES study at [facility] showed showed reduced propulsion, decreased laryngeal closure and sensation and silent aspiration of thin liquids.  SLP provided pt with Shaker head lift exercise to help maintain PES opening/pliability and advised he consider purchasing Life Vac *antichoking* apparatus for emergent use.  In addition, discussed importance of hydration, mobility and oral care *including at night* to decrease bacteria burden of aspirates. Advised to maintain strength of cough and  expectoration for airway clearance.   Recommend continue diet with precautions established by SLP from [facility] - small bites sips, occasional cough and throat clear.  Defer to OP SLP for indication for repeat instrumental evaluation in the future.  Thanks for this consult. SLP Visit Diagnosis: Dysphagia, oropharyngeal phase (R13.12)    Aspiration Risk  Moderate aspiration risk    Diet Recommendation Regular;Thin liquid  -  prefer water , intermittent throat clear, start intake with liquids Frequent mouth care with toothbrush Follow up with OP SLP Shaker Head Isotonic and Isometric exercies to PES pliability Maintain strength of cough and expectoration  Ambulate!   Liquid Administration via: Cup;Straw Medication Administration: Other (Comment) (as tolerated) Supervision: Patient able to self feed    Other Recommendations Oral Care Recommendations: Oral care BID     Swallow Evaluation Recommendations  N/a  Assistance Recommended at Discharge  N/a  Functional Status Assessment Patient has not had a recent decline in their functional status  Frequency and Duration            Prognosis        Swallow Study   General Date of Onset: 08/20/24 HPI: Per MD notes KEANAN MELANDER is a 83 y.o. 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  Patient reports woke up to go to the bathroom and slid off the bed onto the floor landing on his buttocks.  Denies loss of consciousness or striking his head.  Spouse reports that she recently caught a cold and is not sure if he has the same thing.  Patient  denied any wheezing, no hemoptysis, no chest pain, no palpitations, no diaphoresis, no lower extremity edema, no abdominal pain, no nausea/vomiting/diarrhea, no urinary symptoms.  CXR Low lung volumes with hazy left lung base opacity, favor atelectasis.  Pthas undergone prior FEES study at [facility] that showed some silent aspiration of thin liquids due to iatrogenic effects of XRT.   He also endorses some weight loss -stating his pants are looser than normal.  He denies having had pneumonias - until recently.  FEES study showed silent aspiration of thin during the swallow - and pt has been maintained on reg/thin diet. He denies requiring heimlich manuever and states he can manage his pills easily - several at one time if they are small.  Pt is very good at conducting oral care, using water  pic - and he report he is going to start conducting swallowing exercises provided by his OP SLP.  Pt reports part of his epiglottis was removed. Type of Study: Bedside Swallow Evaluation Diet Prior to this Study: Regular;Thin liquids (Level 0) Temperature Spikes Noted: No Respiratory Status: Room air History of Recent Intubation: No Behavior/Cognition: Alert;Cooperative;Pleasant mood;Other (Comment) (verbose) Oral Cavity Assessment: Dry Oral Care Completed by SLP: No Oral Cavity - Dentition: Adequate natural dentition Vision: Functional for self-feeding Self-Feeding Abilities: Able to feed self Patient Positioning: Upright in bed Baseline Vocal Quality: Other (comment) (at times hoarse) Volitional Cough: Other (Comment) (productive at times) Volitional Swallow: Able to elicit    Oral/Motor/Sensory Function Overall Oral Motor/Sensory Function: Other (comment) (appearance of round protrusion on posterior left face - ? near posterior mandibular area/partoid gland - well documented - pt reports his MD at [facility] is aware and monitoring)   Ice Chips Ice chips: Not tested   Thin Liquid Thin Liquid:  Impaired Presentation: Self Fed;Cup Pharyngeal  Phase Impairments: Cough - Immediate;Cough - Delayed;Throat Clearing - Immediate    Nectar Thick Nectar Thick Liquid: Not tested   Honey Thick Honey Thick Liquid: Not tested   Puree Puree: Within functional limits Presentation: Self Fed   Solid     Solid: Impaired Presentation: Self Fed Oral Phase Functional Implications: Other (comment) (trace oral retention - but pt adequately clears with use of liquid wash)      Nicolas Emmie Caldron 08/20/2024,9:30 AM  Madelin POUR, MS [facility] SLP Acute Rehab Services Office [phone]

## 2024-08-20 NOTE — Discharge Summary (Signed)
 Physician Discharge Summary  Jason Noble FMW:[id number] DOB: 25-Dec-1940 DOA: 08/18/2024  PCP: Arloa Elsie SAUNDERS, MD  Admit date: 08/18/2024 Discharge date: 08/20/2024  Admitted From: Home Disposition: Home  Recommendations for Outpatient Follow-up:  Follow up with PCP in 1-2 weeks Continue Levaquin to complete total course of antibiotics x 10 days Aspiration precautions  Home Health: No Equipment/Devices: None  Discharge Condition: Stable CODE STATUS: Full code Diet recommendation: Regular diet, aspiration precautions  History of present illness:  Jason Noble is a 83 y.o. 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  Patient reports woke up to go to the bathroom and slid off the bed onto the floor landing on his buttocks.  Denies loss of consciousness or striking his head.  Spouse reports that she recently caught a cold and is not sure if he has the same thing.  Patient denied any wheezing, no hemoptysis, no chest pain, no palpitations, no diaphoresis, no lower extremity edema, no abdominal pain, no nausea/vomiting/diarrhea, no urinary symptoms.   In the ED, temperature 99.1 F, HR 85, RR 17, BP 100/63, SpO2 84% on room air was placed on nasal cannula.  WBC 14.0, hemoglobin 161, platelet count 161.  Sodium 138, potassium 3.7, chloride 104, CO2 24, glucose 105, BUN 35, creat 1.34.  Procalcitonin 4.53.  CT head without contrast with no acute traumatic injury identified, stable noncontrast CT appearance of the chronic ischemic disease.  Pelvis x-ray with no acute findings, noted  left total hip arthroplasty.  Chest x-ray with low lung zones with hazy left lung base opacity. The patient received 500 mL of normal saline bolus, acetaminophen  1000 mg p.o. x 1, ceftriaxone 1 g IVPB and azithromycin 500 mg IVPB.  TRH consulted for admission for further evaluation and management of fall, community-acquired pneumonia.  Hospital course:  Community-acquired pneumonia Acute hypoxemic respiratory failure, POA Concern for aspiration given malignancy history with squamous cell carcinoma right epiglottis with resection Patient presenting to ED after fall at home.  Was noted to be hypoxic with SpO2 84% on room air at time of arrival placed on nasal cannula.  WBC elevated 14.0.  Chest x-ray with hazy left lung base opacity consistent with pneumonia.  Patient was started on empiric antibiotics with azithromycin and ceftriaxone.  WBC count improved to 9.6 at time of discharge.  Oxygen  was weaned off.  Strep pneumo urine antigen negative.  Blood cultures with no growth during hospitalization.  Will transition to Levaquin to complete total antibiotic course of 10 days.  Recommend continue incentive spirometry, flutter valve on discharge.  Outpatient follow-up with PCP.   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 Lauralee at [facility].    HTN Continue losartan  25 mg p.o. daily, Aspirin  and statin   HLD CAD s/p PCI/CABG Aspirin  81 mg p.o. daily and Atorvastatin  80 mg p.o. daily   Hypothyroidism Levothyroxine 25 mcg p.o. daily   Mechanical fall PT recommending outpatient PT.  Discharge Diagnoses:  Principal Problem:   Acute respiratory failure  with hypoxia (HCC) Active Problems:   Dyslipidemia   Essential hypertension   Chronic kidney disease, stage 3a (HCC)   CAD (coronary artery disease)   Normocytic  anemia   CAP (community acquired pneumonia)    Discharge Instructions  Discharge Instructions     Ambulatory referral to Physical Therapy   Complete by: As directed    Call MD for:  difficulty breathing, headache or visual disturbances   Complete by: As directed    Call MD for:  extreme fatigue   Complete by: As directed    Call MD for:  persistant dizziness or light-headedness   Complete by: As directed    Call MD for:  persistant nausea and vomiting   Complete by: As directed    Call MD for:  severe uncontrolled pain   Complete by: As directed    Call MD for:  temperature >100.4   Complete by: As directed    Diet - low sodium heart healthy   Complete by: As directed    Increase activity slowly   Complete by: As directed       Allergies as of 08/20/2024   No Known Allergies      Medication List     TAKE these medications    aspirin  EC 81 MG tablet Take 81 mg by mouth daily. Swallow whole.   atorvastatin  80 MG tablet Commonly known as: LIPITOR TAKE ONE TABLET BY MOUTH ONCE A DAY   CO Q 10 PO Take 200 mg by mouth daily.   LECITHIN CONCENTRATE PO Take 1 Dose by mouth 2 (two) times a week.   levofloxacin 750 MG tablet Commonly known as: Levaquin Take 1 tablet (750 mg total) by mouth daily for 7 days. Start taking on: August 21, 2024   levothyroxine 25 MCG tablet Commonly known as: SYNTHROID Take 25 mcg by mouth daily.   losartan  50 MG tablet Commonly known as: COZAAR  Take 25 mg by mouth daily.   multivitamin capsule Take 1 capsule by mouth daily.   OMEGA 3 PO Take 1 capsule by mouth daily. 1 per day   OVER THE COUNTER MEDICATION Take 20 mg by mouth daily. Lutigold   VITAMIN D3 PO Take 1 tablet by mouth daily.   vitamin E 180 MG (400 UNITS) capsule Take 400 Units by mouth daily.        Follow-up Information     [facility] at [facility]. Schedule an appointment as soon as possible for a visit.   Specialty:  Rehabilitation Why: call to set up continued outpatient PT Contact information: [address] [facility]. [place]  [address] [phone]               No Known Allergies  Consultations: None   Procedures/Studies: ECHOCARDIOGRAM COMPLETE Result Date: 08/18/2024    ECHOCARDIOGRAM REPORT   Patient Name:   NICKO DAHER Guice Date of Exam: 08/18/2024 Medical Rec #:  [id number]          Height:       67.0 in Accession #:    [id number]         Weight:       163.0 lb Date of Birth:  01-31-1941          BSA:          1.854 m Patient Age:    83 years           BP:  133/80 mmHg Patient Gender: M                  HR:           62 bpm. Exam Location:  Inpatient Procedure: 2D Echo, Color Doppler and Cardiac Doppler (Both Spectral and Color            Flow Doppler were utilized during procedure). Indications:    CAD I25.10  History:        Patient has prior history of Echocardiogram examinations, most                 recent 04/10/2018. CAD and Previous Myocardial Infarction; Risk                 Factors:Hypertension.  Sonographer:    Tinnie Gosling RDCS Referring Phys: [phone] DAVID MANUEL ORTIZ IMPRESSIONS  1. Left ventricular ejection fraction, by estimation, is 50 to 55%. The left ventricle has low normal function. The left ventricle has no regional wall motion abnormalities. Left ventricular diastolic parameters are indeterminate.  2. Right ventricular systolic function is normal. The right ventricular size is normal. Tricuspid regurgitation signal is inadequate for assessing PA pressure.  3. The mitral valve is normal in structure. No evidence of mitral valve regurgitation. No evidence of mitral stenosis.  4. The aortic valve is normal in structure. Aortic valve regurgitation is not visualized. No aortic stenosis is present.  5. The inferior vena cava is normal in size with greater than 50% respiratory variability, suggesting right atrial pressure of 3 mmHg. FINDINGS  Left Ventricle:  Left ventricular ejection fraction, by estimation, is 50 to 55%. The left ventricle has low normal function. The left ventricle has no regional wall motion abnormalities. The left ventricular internal cavity size was normal in size. There is no left ventricular hypertrophy. Left ventricular diastolic parameters are indeterminate. Right Ventricle: The right ventricular size is normal. No increase in right ventricular wall thickness. Right ventricular systolic function is normal. Tricuspid regurgitation signal is inadequate for assessing PA pressure. Left Atrium: Left atrial size was normal in size. Right Atrium: Right atrial size was normal in size. Pericardium: There is no evidence of pericardial effusion. Mitral Valve: The mitral valve is normal in structure. No evidence of mitral valve regurgitation. No evidence of mitral valve stenosis. Tricuspid Valve: The tricuspid valve is normal in structure. Tricuspid valve regurgitation is not demonstrated. No evidence of tricuspid stenosis. Aortic Valve: The aortic valve is normal in structure. Aortic valve regurgitation is not visualized. No aortic stenosis is present. Pulmonic Valve: The pulmonic valve was normal in structure. Pulmonic valve regurgitation is not visualized. No evidence of pulmonic stenosis. Aorta: The aortic root is normal in size and structure. Venous: The inferior vena cava is normal in size with greater than 50% respiratory variability, suggesting right atrial pressure of 3 mmHg. IAS/Shunts: No atrial level shunt detected by color flow Doppler.  LEFT VENTRICLE PLAX 2D LVIDd:         4.70 cm   Diastology LVIDs:         3.40 cm   LV e' medial:    5.00 cm/s LV PW:         1.30 cm   LV E/e' medial:  8.1 LV IVS:        1.30 cm   LV e' lateral:   4.90 cm/s LVOT diam:     2.20 cm   LV E/e' lateral: 8.2 LV SV:  60 LV SV Index:   33 LVOT Area:     3.80 cm LV IVRT:       153 msec  RIGHT VENTRICLE TAPSE (M-mode): 1.7 cm  PULMONARY VEINS                          Diastolic Velocity: 22.40 cm/s                         S/D Velocity:       1.50                         Systolic Velocity:  34.10 cm/s LEFT ATRIUM             Index LA diam:        3.50 cm 1.89 cm/m LA Vol (A2C):   63.6 ml 34.31 ml/m LA Vol (A4C):   41.6 ml 22.44 ml/m LA Biplane Vol: 60.1 ml 32.42 ml/m  AORTIC VALVE LVOT Vmax:   73.10 cm/s LVOT Vmean:  47.900 cm/s LVOT VTI:    0.159 m  AORTA Ao Root diam: 4.90 cm Ao Asc diam:  4.20 cm MITRAL VALVE MV Area (PHT): 2.81 cm    SHUNTS MV Decel Time: 270 msec    Systemic VTI:  0.16 m MV E velocity: 40.30 cm/s  Systemic Diam: 2.20 cm MV A velocity: 97.30 cm/s MV E/A ratio:  0.41 Kardie Tobb DO Electronically signed by Dub Huntsman DO Signature Date/Time: 08/18/2024/2:22:49 PM    Final    CT Head Wo Contrast Result Date: 08/18/2024 EXAM: CT HEAD WITHOUT CONTRAST 08/18/2024 06:00:44 AM TECHNIQUE: CT of the head was performed without the administration of intravenous contrast. Automated exposure control, iterative reconstruction, and/or weight based adjustment of the mA/kV was utilized to reduce the radiation dose to as low as reasonably achievable. COMPARISON: Brain MRI 04/27/2024. CLINICAL HISTORY: 83 year old male. Fall, Polytrauma, blunt. FINDINGS: BRAIN AND VENTRICLES: No acute hemorrhage. No evidence of acute infarct. No hydrocephalus. No extra-axial collection. No mass effect or midline shift. Chronic encephalomalacia left lateral inferior frontal gyrus again noted. Small area of cortical encephalomalacia at the right hemisphere vertex. Superimposed confluent bilateral periventricular and cerebral white matter hypodensity. Stable small chronic cerebellar infarcts, by CT more apparent in the left cerebellar hemisphere. Stable deep gray nuclei heterogeneity. Small area of cortical encephalomalacia at the right hemisphere vertex. Generalized intracranial artery tortuosity. Middle cerebral artery branch calcified atherosclerosis. ORBITS: No acute abnormality.  SINUSES: Visible paranasal sinuses, middle ears and mastoids are well aerated. SOFT TISSUES AND SKULL: Calcified scalp vessel atherosclerosis. Calcified atherosclerosis at the skull base. No acute soft tissue abnormality. No skull fracture. IMPRESSION: 1. No acute traumatic injury identified. 2. Stable non-contrast CT appearance of chronic ischemic disease. Electronically signed by: Helayne Hurst MD 08/18/2024 06:13 AM EST RP Workstation: [id number]   DG Chest Portable 1 View Result Date: 08/18/2024 EXAM: 1 VIEW(S) XRAY OF THE CHEST 08/18/2024 05:09:00 AM COMPARISON: 06/24/2023 CLINICAL HISTORY: 83 year old male. History of fall etc. FINDINGS: LUNGS AND PLEURA: Low lung volumes. Hazy opacity in left lower lung zone. No overt edema. No pleural effusion. No pneumothorax. HEART AND MEDIASTINUM: Atherosclerotic plaque. Surgical changes in mediastinum. Intact sternotomy wires. No acute abnormality of the cardiac and mediastinal silhouettes. BONES AND SOFT TISSUES: No acute osseous abnormality. IMPRESSION: 1. Low lung volumes with hazy left lung base opacity, favor atelectasis. Electronically signed by: Helayne  Hall MD 08/18/2024 05:21 AM EST RP Workstation: [id number]   DG Pelvis Portable Result Date: 08/18/2024 EXAM: 1 or 2 VIEW(S) XRAY OF THE PELVIS 08/18/2024 05:08:00 AM COMPARISON: None available. CLINICAL HISTORY: falletc FINDINGS: BONES AND JOINTS: Left total hip arthroplasty noted. Moderate degenerative changes of the right hip. No acute fracture. No focal osseous lesion. No joint dislocation. SOFT TISSUES: Surgical clips in the pelvis noted. Overlying leads noted. Pelvic phleboliths. Vascular calcifications. IMPRESSION: 1. No acute findings. 2. Left total hip arthroplasty. Electronically signed by: Waddell Calk MD 08/18/2024 05:20 AM EST RP Workstation: [id number]     Subjective: Patient seen examined bedside, lying in bed.  No specific complaints this morning.  Remains weaned off of oxygen .  White count  now normalized.  Continues to report intermittent cough with mild sputum production.  Discharging home.  Denies headache, no vision change, no chest pain, no palpitations, no current shortness of breath, no fever/chills/night sweats, no nausea/vomiting/diarrhea, no abdominal pain, no focal weakness, no fatigue, no paresthesias.  No acute events overnight per nursing staff.  Discharge Exam: Vitals:   08/19/24 2203 08/20/24 0603  BP: (!) 149/89 (!) 146/104  Pulse: 64 63  Resp:  18  Temp:  98.9 F (37.2 C)  SpO2:  94%   Vitals:   08/19/24 1404 08/19/24 2137 08/19/24 2203 08/20/24 0603  BP: 129/73 (!) 175/91 (!) 149/89 (!) 146/104  Pulse: 87 60 64 63  Resp: 18 18  18   Temp: 98.1 F (36.7 C) 98.6 F (37 C)  98.9 F (37.2 C)  TempSrc: Oral Oral  Oral  SpO2: 98% 97%  94%  Weight:      Height:        Physical Exam: GEN: NAD, alert and oriented x 3, chronically ill in appearance HEENT: NCAT, PERRL, EOMI, sclera clear, MMM, left parotid nodule NTTP PULM: Breath sounds slightly diminished lower bases without wheezes/crackles, normal respiratory effort without accessory muscle use, on room air with SpO2 94% at rest CV: RRR w/o M/G/R GI: abd soft, NTND, + BS MSK: no peripheral edema, muscle strength globally intact 5/5 bilateral upper/lower extremities NEURO: CN II-XII intact, no focal deficits, sensation to light touch intact PSYCH: normal mood/affect Integumentary: Left parotid lesion/nodule, nontender to palpation, otherwise no other concerning rashes/lesions/wounds noted on exposed skin surfaces    The results of significant diagnostics from this hospitalization (including imaging, microbiology, ancillary and laboratory) are listed below for reference.     Microbiology: Recent Results (from the past 240 hours)  Blood culture (routine x 2)     Status: None (Preliminary result)   Collection Time: 08/18/24  6:45 AM   Specimen: BLOOD  Result Value Ref Range Status   Specimen  Description   Final    BLOOD BLOOD RIGHT FOREARM Performed at [facility], [address] [place], [place] [address]    Special Requests   Final    BOTTLES DRAWN AEROBIC AND ANAEROBIC Blood Culture adequate volume Performed at [facility], [address] [place], [place] [address]    Culture   Final    NO GROWTH 2 DAYS Performed at [facility], [address], [place], [place] [address]    Report Status PENDING  Incomplete  Blood culture (routine x 2)     Status: None (Preliminary result)   Collection Time: 08/18/24  6:52 AM   Specimen: BLOOD  Result Value Ref Range Status   Specimen Description   Final    BLOOD BLOOD  LEFT FOREARM Performed at [facility] [address], [place], [place] [address]    Special Requests   Final    BOTTLES DRAWN AEROBIC AND ANAEROBIC Blood Culture adequate volume Performed at [facility], [address], [place], [place] [address]    Culture   Final    NO GROWTH 2 DAYS Performed at [facility], [address] [place], [place] [address]    Report Status PENDING  Incomplete  MRSA Next Gen by PCR, Nasal     Status: None   Collection Time: 08/18/24  4:13 PM   Specimen: Nasal Mucosa; Nasal Swab  Result Value Ref Range Status   MRSA by PCR Next Gen NOT DETECTED NOT DETECTED Final    Comment: (NOTE) The GeneXpert MRSA Assay (FDA approved for NASAL specimens only), is one component of a comprehensive MRSA colonization surveillance program. It is not intended to diagnose MRSA infection nor to guide or monitor treatment for MRSA infections. Test performance is not FDA approved in patients less than 29 years old. Performed at [facility], [address], [place], [place] [address]   Expectorated Sputum Assessment w Gram Stain, Rflx to Resp Cult     Status: None   Collection Time: 08/18/24  6:57 PM   Specimen: Expectorated Sputum   Result Value Ref Range Status   Specimen Description EXPECTORATED SPUTUM  Final   Special Requests NONE  Final   Sputum evaluation   Final    THIS SPECIMEN IS ACCEPTABLE FOR SPUTUM CULTURE Performed at [facility], [address], [place], [place] [address]    Report Status 08/19/2024 FINAL  Final  Culture, Respiratory w Gram Stain     Status: None (Preliminary result)   Collection Time: 08/18/24  6:57 PM  Result Value Ref Range Status   Specimen Description   Final    EXPECTORATED SPUTUM Performed at [facility], [address] [place], [place] [address]    Special Requests   Final    NONE Reflexed from [id number] Performed at [facility], [address], [place], [place] [address]    Gram Stain   Final    RARE WBC PRESENT, PREDOMINANTLY PMN NO ORGANISMS SEEN Performed at [facility], [address] [place], [place] [address]    Culture PENDING  Incomplete   Report Status PENDING  Incomplete     Labs: BNP (last 3 results) No results for input(s): BNP in the last 8760 hours. Basic Metabolic Panel: Recent Labs  Lab 08/18/24 0643 08/19/24 0438 08/20/24 0458  NA 138 138 137  K 3.7 4.0 4.2  CL 104 102 104  CO2 24 27 24   GLUCOSE 105* 80 96  BUN 35* 35* 31*  CREATININE 1.34* 1.16 1.05  CALCIUM  9.4 9.4 9.0   Liver Function Tests: Recent Labs  Lab 08/19/24 0438 08/20/24 0458  AST 70* 55*  ALT 21 15  ALKPHOS 78 72  BILITOT 1.0 0.5  PROT 6.9 6.0*  ALBUMIN 3.7 3.1*   No results for input(s): LIPASE, AMYLASE in the last 168 hours. No results for input(s): AMMONIA in the last 168 hours. CBC: Recent Labs  Lab 08/18/24 0643 08/19/24 0438 08/20/24 0458  WBC 14.0* 18.9* 9.6  NEUTROABS 12.6*  --   --   HGB 11.0* 12.0* 10.9*  HCT 34.6* 37.3* 34.5*  MCV 89.6 90.1 89.1  PLT 161 201 170   Cardiac Enzymes: No results for  input(s): CKTOTAL, CKMB, CKMBINDEX, TROPONINI in the last 168  hours. BNP: Invalid input(s): POCBNP CBG: No results for input(s): GLUCAP in the last 168 hours. D-Dimer No results for input(s): DDIMER in the last 72 hours. Hgb A1c No results for input(s): HGBA1C in the last 72 hours. Lipid Profile No results for input(s): CHOL, HDL, LDLCALC, TRIG, CHOLHDL, LDLDIRECT in the last 72 hours. Thyroid  function studies No results for input(s): TSH, T4TOTAL, T3FREE, THYROIDAB in the last 72 hours.  Invalid input(s): FREET3 Anemia work up Recent Labs    08/19/24 0438  VITAMINB12 792  FOLATE 9.7  FERRITIN 207  TIBC 295  IRON 14*  RETICCTPCT 1.3   Urinalysis    Component Value Date/Time   COLORURINE YELLOW 01/26/2016 0844   APPEARANCEUR CLEAR 01/26/2016 0844   LABSPEC 1.020 01/26/2016 0844   PHURINE 6.0 01/26/2016 0844   GLUCOSEU NEGATIVE 01/26/2016 0844   HGBUR NEGATIVE 01/26/2016 0844   BILIRUBINUR NEGATIVE 01/26/2016 0844   KETONESUR NEGATIVE 01/26/2016 0844   PROTEINUR NEGATIVE 01/26/2016 0844   UROBILINOGEN 4.0 (H) 07/10/2009 1610   NITRITE NEGATIVE 01/26/2016 0844   LEUKOCYTESUR NEGATIVE 01/26/2016 0844   Sepsis Labs Recent Labs  Lab 08/18/24 0643 08/19/24 0438 08/20/24 0458  WBC 14.0* 18.9* 9.6   Microbiology Recent Results (from the past 240 hours)  Blood culture (routine x 2)     Status: None (Preliminary result)   Collection Time: 08/18/24  6:45 AM   Specimen: BLOOD  Result Value Ref Range Status   Specimen Description   Final    BLOOD BLOOD RIGHT FOREARM Performed at [facility] [address] [place], [place] [address]    Special Requests   Final    BOTTLES DRAWN AEROBIC AND ANAEROBIC Blood Culture adequate volume Performed at [facility], [address] [place], [place] [address]    Culture   Final    NO GROWTH 2 DAYS Performed at [facility], [address] [place], [place] [address]    Report Status PENDING  Incomplete  Blood  culture (routine x 2)     Status: None (Preliminary result)   Collection Time: 08/18/24  6:52 AM   Specimen: BLOOD  Result Value Ref Range Status   Specimen Description   Final    BLOOD BLOOD LEFT FOREARM Performed at [facility], [address], [place], [place] [address]    Special Requests   Final    BOTTLES DRAWN AEROBIC AND ANAEROBIC Blood Culture adequate volume Performed at [facility], [address], [place], [place] [address]    Culture   Final    NO GROWTH 2 DAYS Performed at [facility], [address], [place], [place] [address]    Report Status PENDING  Incomplete  MRSA Next Gen by PCR, Nasal     Status: None   Collection Time: 08/18/24  4:13 PM   Specimen: Nasal Mucosa; Nasal Swab  Result Value Ref Range Status   MRSA by PCR Next Gen NOT DETECTED NOT DETECTED Final    Comment: (NOTE) The GeneXpert MRSA Assay (FDA approved for NASAL specimens only), is one component of a comprehensive MRSA colonization surveillance program. It is not intended to diagnose MRSA infection nor to guide or monitor treatment for MRSA infections. Test performance is not FDA approved in patients less than 20 years old. Performed at [facility], [address], [place], [place] [address]   Expectorated Sputum Assessment w  Gram Stain, Rflx to Resp Cult     Status: None   Collection Time: 08/18/24  6:57 PM   Specimen: Expectorated Sputum  Result Value Ref Range Status   Specimen Description EXPECTORATED SPUTUM  Final   Special Requests NONE  Final   Sputum evaluation   Final    THIS SPECIMEN IS ACCEPTABLE FOR SPUTUM CULTURE Performed at [facility], [address], [place], [place] [address]    Report Status 08/19/2024 FINAL  Final  Culture, Respiratory w Gram Stain     Status: None (Preliminary result)   Collection Time: 08/18/24  6:57 PM  Result Value Ref Range Status   Specimen Description   Final     EXPECTORATED SPUTUM Performed at [facility], [address] [place], [place] [address]    Special Requests   Final    NONE Reflexed from [id number] Performed at [facility], [address] [place], [place] [address]    Gram Stain   Final    RARE WBC PRESENT, PREDOMINANTLY PMN NO ORGANISMS SEEN Performed at [facility], [address] [place], [place] [address]    Culture PENDING  Incomplete   Report Status PENDING  Incomplete     Time coordinating discharge: Over 30 minutes  SIGNED:   Camellia PARAS Nuala Chiles, DO  [facility] Hospitalists 08/20/2024, 10:31 AM

## 2024-08-20 NOTE — Progress Notes (Signed)
 Discharge meds in a secure bag delivered to patient in room by this RN

## 2024-08-21 ENCOUNTER — Ambulatory Visit: Admitting: Physical Therapy

## 2024-08-21 LAB — CULTURE, RESPIRATORY W GRAM STAIN: Culture: NORMAL

## 2024-08-23 LAB — CULTURE, BLOOD (ROUTINE X 2)
Culture: NO GROWTH
Culture: NO GROWTH
Special Requests: ADEQUATE
Special Requests: ADEQUATE

## 2024-08-24 ENCOUNTER — Ambulatory Visit: Admitting: Physical Therapy

## 2024-08-27 ENCOUNTER — Ambulatory Visit: Admitting: Physical Therapy

## 2024-08-27 ENCOUNTER — Ambulatory Visit: Admitting: Speech Pathology

## 2024-08-31 ENCOUNTER — Ambulatory Visit: Admitting: Physical Therapy

## 2024-09-01 ENCOUNTER — Ambulatory Visit: Admitting: Physical Therapy

## 2024-09-01 ENCOUNTER — Encounter: Payer: Self-pay | Admitting: Speech Pathology

## 2024-09-01 ENCOUNTER — Ambulatory Visit: Admitting: Speech Pathology

## 2024-09-01 DIAGNOSIS — R2681 Unsteadiness on feet: Secondary | ICD-10-CM | POA: Diagnosis present

## 2024-09-01 DIAGNOSIS — M6281 Muscle weakness (generalized): Secondary | ICD-10-CM | POA: Diagnosis present

## 2024-09-01 DIAGNOSIS — R296 Repeated falls: Secondary | ICD-10-CM | POA: Diagnosis present

## 2024-09-01 DIAGNOSIS — R1312 Dysphagia, oropharyngeal phase: Secondary | ICD-10-CM | POA: Diagnosis present

## 2024-09-01 DIAGNOSIS — R260 Ataxic gait: Secondary | ICD-10-CM | POA: Diagnosis present

## 2024-09-01 NOTE — Therapy (Unsigned)
 OUTPATIENT SPEECH LANGUAGE PATHOLOGY SWALLOW TREATMENT   Patient Name: Jason Noble MRN: [id number] DOB:[birth year], 83 y.o., male Today's Date: 09/01/2024  PCP: Arloa Elsie SAUNDERS, MD REFERRING PROVIDER: Lauralee Chew, MD  END OF SESSION:  End of Session - 09/01/24 1107     Visit Number 2    Number of Visits 7    Date for Recertification  10/05/24    SLP Start Time 1100    SLP Stop Time  1140    SLP Time Calculation (min) 40 min    Activity Tolerance Patient tolerated treatment well          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    Basal cell carcinoma of skin    Broken jaw (HCC)    right lower    Cholecystitis    Coronary heart disease    has a stent   CVA (cerebral infarction) 2007   mild   Fatigue 07/26/2011   History of cancer chemotherapy    History of radiation therapy 06/18/2011- 08/03/2011   squamous cell tongue/69.96 Gy 37fx   Hypercholesteremia    under control   Hypertension    Myocardial infarction (HCC)    approx 2008 or 2009   Numbness    right lower    Numbness and tingling in left hand    numbess 2 fingers    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   Peripheral vascular disease    PFO (patent foramen ovale)    Prostate cancer (HCC) 2002   Skin cancer    basal cell, squamous cell   Squamous cell carcinoma of skin    Stroke (HCC)    appro in 08 or 09   Past Surgical History:  Procedure Laterality Date   CATARACT EXTRACTION W/ INTRAOCULAR LENS  IMPLANT, BILATERAL     CHOLECYSTECTOMY  04/2009   3 surgeries involved: lap chole x2 laparotomy   CORONARY ARTERY BYPASS GRAFT  1991   triple   DIRECT LARYNGOSCOPY N/A 02/17/2020   Procedure: DIRECT LARYNGOSCOPY WITH BIOPSY;  Surgeon: Jesus Oliphant, MD;  Location: [facility] OR;  Service: ENT;  Laterality: N/A;   ELBOW SURGERY  42 years ago   removal left radial head due to hockey accident   EXPLORATORY LAPAROTOMY  04/2010    Dr. Tanda (3rd surgery for gall bladder)   INGUINAL HERNIA REPAIR Right 09/23/2014   Procedure: RIGHT INGUINAL HERNIA REPAIR WITH MESH;  Surgeon: Krystal Russell, MD;  Location: [facility] ORS;  Service: General;  Laterality: Right;   INGUINAL HERNIA REPAIR Left 09/30/2015   Procedure: HERNIA REPAIR LEFT INGUINAL ADULT;  Surgeon: Krystal Russell, MD;  Location: [facility];  Service: General;  Laterality: Left;   INSERTION OF MESH Left 09/30/2015   Procedure: INSERTION OF MESH;  Surgeon: Krystal Russell, MD;  Location: [facility];  Service: General;  Laterality: Left;   MULTIPLE TOOTH EXTRACTIONS     prior to radiation   PEG TUBE PLACEMENT  06/13/11 at [facility]   10/2011 removed   PROSTATECTOMY  2002   TONSILLECTOMY     x2: as child then 1985 for Left lingual tonsil removed   TOTAL HIP ARTHROPLASTY Left 02/08/2016   Procedure: TOTAL HIP ARTHROPLASTY ANTERIOR APPROACH;  Surgeon: Dempsey Sensor, MD;  Location: [facility] OR;  Service: Orthopedics;  Laterality: Left;   ULNAR NERVE TRANSPOSITION Left 10/16/2018   Procedure: ULNAR NERVE DECOMPRESSION/TRANSPOSITION;  Surgeon: Dozier,  Eva, MD;  Location: [facility] OR;  Service: Orthopedics;  Laterality: Left;   Patient Active Problem List   Diagnosis Date Noted   Constipation 08/18/2024   Prediabetes 08/18/2024   Chronic kidney disease, stage 3a (HCC) 08/18/2024   Vitamin D deficiency 08/18/2024   Hypercholesterolemia 08/18/2024   History of malignant neoplasm of tongue 08/18/2024   History of malignant melanoma of skin 08/18/2024   Acute respiratory failure with hypoxia (HCC) 08/18/2024   Normocytic anemia 08/18/2024   CAP (community acquired pneumonia) 08/18/2024   Oropharyngeal dysphagia 06/11/2023   Pulmonary embolism (HCC) 05/17/2020   Anticoagulated 05/17/2020   CAD (coronary artery disease) 03/15/2020   History of CVA (cerebrovascular accident) 03/15/2020   Cancer, 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   History of pharyngeal cancer 12/22/2019   Ulnar neuritis, left 10/16/2018    Primary osteoarthritis of left hip 02/08/2016   Pre-operative cardiovascular examination 01/25/2016   Dilated aortic root 01/25/2016   Essential hypertension 01/25/2016   Hearing deficit 04/27/2014   Dry mouth 04/27/2014   Malignant neoplasm of base of tongue (HCC) 07/27/2011   Fatigue 07/26/2011   Aneurysm of thoracic aorta 09/05/2010   PATENT FORAMEN OVALE 11/28/2009   MYOCARDIAL INFARCTION, HX OF 11/24/2009   Hx of CABG 11/24/2009   PROSTATE CANCER, HX OF 11/24/2009   CEREBROVASCULAR ACCIDENT, HX OF 11/24/2009   Dyslipidemia 11/23/2009   MALAISE AND FATIGUE 11/23/2009   CHOLECYSTECTOMY, HX OF 11/23/2009    ONSET DATE: Referred on 07/02/24   REFERRING DIAG: 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  Pt accompanied by: self  PERTINENT HISTORY: Per chart review: History of BOT scca treated with CRT in 2012 and newer diagnosis of right supraglottic cancer with laser excision on 03/17/2020 with Dr. Lauralee.     PAIN:  Are you having pain? No  FALLS: Has patient fallen in last 6 months?  Yes; 3  LIVING ENVIRONMENT: Lives with: lives with their spouse; [name] Lives in: House/apartment  PLOF:  Level of assistance: Needed assistance with ADLs, Needed assistance with IADLS Employment: Retired; Agricultural Engineer   PATIENT GOALS: swallowing  OBJECTIVE:  Note: Objective measures were completed at Evaluation unless otherwise noted. OBJECTIVE:   DIAGNOSTIC FINDINGS: Per EMR  MR BRAIN WO CONTRAST 04/27/24 IMPRESSION: 1. No evidence of acute intracranial abnormality. 2. Moderate chronic microvascular ischemic disease and remote cerebellar and left frontal infarcts. 3. 4.  Cerebral Atrophy  (ICD10-G31.9).     Electronically Signed   By: Gilmore GORMAN Molt M.D.   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 Dysphagia is characterized by reduced bolus propulsion, incomplete airway protection, and reduced laryngeal sensation. There has been a decline in patient's swallowing function since last seen with increased aspiration events with thin liquids. Patient stopped completing his swallowing exercises several months ago. Patient was educated on risks and adverse outcomes associated with dysphagia and aspiration. The relative risks and benefits of management options were reviewed. Patient denies PNA or respiratory challenges since last seen, although his wife notes he is coughing more often when eating. Patient plans to continue his current diet with awareness  of his aspiration risk and adherence to aspiration precautions. He is interested in outpatient swallowing therapy, with goal of strengthening his swallowing musculature and improving airway protection, if this can be completed closer to home [facility]). We will send a referral through [facility]. Patient was advised to watch for signs of pneumonia including fevers, cough, chest discomfort, etc. Discussed return to clinic/hospital criteria.    Recommendations: Diet: patient plans to continue his current diet - self-selected solids and thin liquids Aspiration precautions:  sit upright during all PO (as close to 90 degrees as possible) take small bites/sips cough/clear throat regularly during the meal Medications: one at a time   Additional Recommendations: Swallowing therapy - outpatient therapy (referral to [facility]) Repeat swallow evaluation - at [facility] in coordination with ENT; interim evaluation per treating theraist Patient  should monitor closely for s/s of PNA and seek medical attention as needed   Prior/Pertinent Swallowing Evaluations: Multiple, most recent- *MBS 09/05/23- Impression: MBS per prior FEES to further assess the esophagus given the intermittent episodes of regurgitation in to the hypopharynx. MBS exam does show a CP bar with trace reflow of bolus residual in the cervical esophagus. An esophageal sweep showed evidence of dysmotility per the Radiologist with reduced clearance and tertiary contractions (see series 11). Given that patient has minimal impact from slight CP bar presence combined with esophageal findings, anticipate UES dilation may not be overly beneficial. Patient in agreement. His oropharyngeal swallow is otherwise stable in appearance. Encourage him to continue with his current diet, adherence to swallow precautions, and home swallow therapy program. We will plan to follow-up with patient on 3/25 in coordination with his Dr. Lauralee appointment. Recommendations: Diet: continue current diet, self-selected solids and thin liquids Aspiration precautions:  sit upright during all PO fully swallow before next bite/sip cough/clear throat intermittently during the meal Medications: one at a time Additional Recommendations:  Swallowing therapy - continue home program Barium esophagram - may consider if esophageal complaints increase SLP follow-up (CSE) 3/25 in coordination with ENT return   *CSE 12/10/23- Impression: Patient seen for follow-up on swallowing in coordination with ENT visit. Patient with stable swallowing function. Reviewed prior swallow evaluation - MBS on 09/05/23. Patient with no additional swallowing concerns at this time. Reviewed rationale for continued swallowing exercises in the post-treatment setting. Encouraged patient to resume home program as recent performance has been limited due to additional health issues. Will plan to repeat patient's swallow evaluation on an annual  basis, certainly sooner should additional concerns arise. Will coordinate with ENT appointments as able.  COGNITION: Overall cognitive status: History of cognitive impairments - at baseline; Seeing Dr. Gregg for Neurology   -Tangential; requires redirection   SUBJECTIVE DYSPHAGIA REPORTS:  Date of onset: ~10 years ago; worsened in 2021 Reported symptoms: coughing with both solids and liquids, xerostomia, and hoarseness  Current diet: regular and thin liquids  Co-morbid voice changes: No  FACTORS WHICH MAY INCREASE RISK OF ADVERSE EVENT IN PRESENCE OF ASPIRATION:  General health: frail or deconditioned  Risk factors: reduced cognitive function, weak cough, and compromised immune system    ORAL MOTOR EXAMINATION: Overall status: WFL Comments: NA  CLINICAL SWALLOW ASSESSMENT:   Dentition: adequate natural dentition; partial bridge Vocal quality at baseline: normal and low vocal intensity Patient directly observed with POs: Yes: thin liquids  Feeding: able to feed self Liquids provided by: bottle [facility] Swallow Protocol: Pt silently aspirating; did not complete as he just had recent FEES Oral phase signs and symptoms: NA  Pharyngeal phase signs and symptoms: multiple swallows and immediate throat clear  PATIENT REPORTED OUTCOME MEASURES (PROM): EAT-10: 31/40 (indicating more severe difficulty)                                                                                                                              TREATMENT DATE:   09/01/24: Pt was seen for skilled ST services targeting dysphagia exercises. He reports he has been trying to do re: effortful swallow, mendelsohn, tongue holds, and masako. Pt reports he had PNA in the hospital that was discovered after his fall. Pt cognition appears to impact his ability to retain information. SLP provided pt with exercise and aspiration PNA handouts. He was instructed to read over each to promote understanding. Pt able to complete  tongue exercises independently. He is having difficulty with effortful swallow and mendelsohn. To cont with education to promote compliance.   PATIENT EDUCATION: Education details: Dysphagia, SLP role Person educated: Patient Education method: Explanation Education comprehension: verbalized understanding   ASSESSMENT:  CLINICAL IMPRESSION: Pt is a 83 yo male who presents to ST OP for evaluation post Flexible Endoscopic Evaluation of Swallowing (FEES). Pt has had speech therapy treatment in the past for pharyngeal strengthening exercises. He has not been completing exercises at home. Pt was unable to recall outcome of FEES until reminded by SLP. Pt is silently aspiration and per report, wife notes he is coughing more often while eating. Pt was tangential and required redirection back to questions. Pt was assessed using EAT-10 and was observed with single sips of water  - see above. SLP observed immediate throat clear ~5 throat clears per sip of liquid. Pt was observed to throat clear throughout evaluation in absence AND presence of liquid intake. Pt was able to recall a few of his swallow exercises he has been educated on by previous therapist at [facility]. SLP rec skilled ST services to address dysphagia. Pt to follow up with ENT/evaluating therapist in a few months.    OBJECTIVE IMPAIRMENTS: include dysphagia. These impairments are limiting patient from safety when swallowing. Factors affecting potential to achieve goals and functional outcome are severity of impairments. Patient will benefit from skilled SLP services to address above impairments and improve overall function.  REHAB POTENTIAL: Fair due to severity    GOALS: Goals reviewed with patient? Yes  SHORT TERM GOALS= LONG TERM GOALS   10/06/23 (due to scheduling)  Pt will demonstrate use of pharyngeal strengthening exercises (tongue holds, effortful swallow, mendelsohn maneuver, CTAR) Baseline: Goal status: INITIAL  2.  Pt will  recall aspiration precautions independently  Baseline:  Goal status: INITIAL  3.  Pt will improve score on PROM Baseline:  Goal status: INITIAL    PLAN:  SLP FREQUENCY: 1x/week  SLP DURATION: 6 weeks  PLANNED INTERVENTIONS: Aspiration precaution training, Pharyngeal strengthening exercises, Diet toleration management , Environmental controls, Trials of upgraded texture/liquids, Cueing hierachy, SLP instruction and feedback, Compensatory strategies, Patient/family education, and [id number] Treatment  of swallowing function    [employer], CCC-SLP 09/01/2024, 11:08 AM

## 2024-09-03 ENCOUNTER — Ambulatory Visit

## 2024-09-07 ENCOUNTER — Other Ambulatory Visit (HOSPITAL_COMMUNITY): Payer: Self-pay

## 2024-09-07 ENCOUNTER — Other Ambulatory Visit: Payer: Self-pay

## 2024-09-11 ENCOUNTER — Ambulatory Visit: Admitting: Speech Pathology

## 2024-09-16 ENCOUNTER — Ambulatory Visit: Admitting: Speech Pathology

## 2024-09-23 ENCOUNTER — Encounter: Payer: Self-pay | Admitting: Speech Pathology

## 2024-09-23 ENCOUNTER — Ambulatory Visit: Attending: Otolaryngology | Admitting: Speech Pathology

## 2024-09-23 DIAGNOSIS — R1312 Dysphagia, oropharyngeal phase: Secondary | ICD-10-CM | POA: Insufficient documentation

## 2024-09-23 NOTE — Therapy (Signed)
 " OUTPATIENT SPEECH LANGUAGE PATHOLOGY SWALLOW TREATMENT   Patient Name: Jason Noble MRN: [id number] DOB:1940-12-16, 84 y.o., male Today's Date: 09/23/2024  PCP: Arloa Elsie SAUNDERS, MD REFERRING PROVIDER: Lauralee Chew, MD  END OF SESSION:  End of Session - 09/23/24 1241     Visit Number 3    Number of Visits 7    Date for Recertification  10/05/24    SLP Start Time 1230    SLP Stop Time  1310    SLP Time Calculation (min) 40 min    Activity Tolerance Patient tolerated treatment well          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    Basal cell carcinoma of skin    Broken jaw (HCC)    right lower    Cholecystitis    Coronary heart disease    has a stent   CVA (cerebral infarction) 2007   mild   Fatigue 07/26/2011   History of cancer chemotherapy    History of radiation therapy 06/18/2011- 08/03/2011   squamous cell tongue/69.96 Gy 44fx   Hypercholesteremia    under control   Hypertension    Myocardial infarction (HCC)    approx 2008 or 2009   Numbness    right lower    Numbness and tingling in left hand    numbess 2 fingers    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   Peripheral vascular disease    PFO (patent foramen ovale)    Prostate cancer (HCC) 2002   Skin cancer    basal cell, squamous cell   Squamous cell carcinoma of skin    Stroke (HCC)    appro in 08 or 09   Past Surgical History:  Procedure Laterality Date   CATARACT EXTRACTION W/ INTRAOCULAR LENS  IMPLANT, BILATERAL     CHOLECYSTECTOMY  04/2009   3 surgeries involved: lap chole x2 laparotomy   CORONARY ARTERY BYPASS GRAFT  1991   triple   DIRECT LARYNGOSCOPY N/A 02/17/2020   Procedure: DIRECT LARYNGOSCOPY WITH BIOPSY;  Surgeon: Jesus Oliphant, MD;  Location: [facility] OR;  Service: ENT;  Laterality: N/A;   ELBOW SURGERY  42 years ago   removal left radial head due to hockey accident   EXPLORATORY LAPAROTOMY  04/2010   Dr.  Tanda (3rd surgery for gall bladder)   INGUINAL HERNIA REPAIR Right 09/23/2014   Procedure: RIGHT INGUINAL HERNIA REPAIR WITH MESH;  Surgeon: Krystal Russell, MD;  Location: [facility] ORS;  Service: General;  Laterality: Right;   INGUINAL HERNIA REPAIR Left 09/30/2015   Procedure: HERNIA REPAIR LEFT INGUINAL ADULT;  Surgeon: Krystal Russell, MD;  Location: [facility];  Service: General;  Laterality: Left;   INSERTION OF MESH Left 09/30/2015   Procedure: INSERTION OF MESH;  Surgeon: Krystal Russell, MD;  Location: [facility] OR[facility];  Service: General;  Laterality: Left;   MULTIPLE TOOTH EXTRACTIONS     prior to radiation   PEG TUBE PLACEMENT  06/13/11 at [facility]   10/2011 removed   PROSTATECTOMY  2002   TONSILLECTOMY     x2: as child then 1985 for Left lingual tonsil removed   TOTAL HIP ARTHROPLASTY Left 02/08/2016   Procedure: TOTAL HIP ARTHROPLASTY ANTERIOR APPROACH;  Surgeon: Dempsey Sensor, MD;  Location: [facility] OR;  Service: Orthopedics;  Laterality: Left;   ULNAR NERVE TRANSPOSITION Left 10/16/2018   Procedure: ULNAR NERVE DECOMPRESSION/TRANSPOSITION;  Surgeon:  Dozier Soulier, MD;  Location: [facility] OR;  Service: Orthopedics;  Laterality: Left;   Patient Active Problem List   Diagnosis Date Noted   Constipation 08/18/2024   Prediabetes 08/18/2024   Chronic kidney disease, stage 3a (HCC) 08/18/2024   Vitamin D deficiency 08/18/2024   Hypercholesterolemia 08/18/2024   History of malignant neoplasm of tongue 08/18/2024   History of malignant melanoma of skin 08/18/2024   Acute respiratory failure with hypoxia (HCC) 08/18/2024   Normocytic anemia 08/18/2024   CAP (community acquired pneumonia) 08/18/2024   Oropharyngeal dysphagia 06/11/2023   Pulmonary embolism (HCC) 05/17/2020   Anticoagulated 05/17/2020   CAD (coronary artery disease) 03/15/2020   History of CVA (cerebrovascular accident) 03/15/2020   Cancer, 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   History of pharyngeal cancer 12/22/2019   Ulnar neuritis, left 10/16/2018    Primary osteoarthritis of left hip 02/08/2016   Pre-operative cardiovascular examination 01/25/2016   Dilated aortic root 01/25/2016   Essential hypertension 01/25/2016   Hearing deficit 04/27/2014   Dry mouth 04/27/2014   Malignant neoplasm of base of tongue (HCC) 07/27/2011   Fatigue 07/26/2011   Aneurysm of thoracic aorta 09/05/2010   PATENT FORAMEN OVALE 11/28/2009   MYOCARDIAL INFARCTION, HX OF 11/24/2009   Hx of CABG 11/24/2009   PROSTATE CANCER, HX OF 11/24/2009   CEREBROVASCULAR ACCIDENT, HX OF 11/24/2009   Dyslipidemia 11/23/2009   MALAISE AND FATIGUE 11/23/2009   CHOLECYSTECTOMY, HX OF 11/23/2009    ONSET DATE: Referred on 07/02/24   REFERRING DIAG: 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   Pt accompanied by: self  PERTINENT HISTORY: Per chart review: History of BOT scca treated with CRT in 2012 and newer diagnosis of right supraglottic cancer with laser excision on 03/17/2020 with Dr. Lauralee.     PAIN:  Are you having pain? No  FALLS: Has patient fallen in last 6 months?  Yes; 3  LIVING ENVIRONMENT: Lives with: lives with their spouse; [name] Lives in: House/apartment  PLOF:  Level of assistance: Needed assistance with ADLs, Needed assistance with IADLS Employment: Retired; Agricultural Engineer   PATIENT GOALS: swallowing  OBJECTIVE:  Note: Objective measures were completed at Evaluation unless otherwise noted. OBJECTIVE:   DIAGNOSTIC FINDINGS: Per EMR  MR BRAIN WO CONTRAST 04/27/24 IMPRESSION: 1. No evidence of acute intracranial abnormality. 2. Moderate chronic microvascular ischemic disease and remote cerebellar and left frontal infarcts. 3. 4.  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     Electronically Signed   By:  Gilmore GORMAN Molt M.D.   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 Dysphagia is characterized by reduced bolus propulsion, incomplete airway protection, and reduced laryngeal sensation. There has been a decline in patient's swallowing function since last seen with increased aspiration events with thin liquids. Patient stopped completing his swallowing exercises several months ago. Patient was educated on risks and adverse outcomes associated with dysphagia and aspiration. The relative risks and benefits of management options were reviewed. Patient denies PNA or respiratory challenges since last seen, although his wife notes he is coughing more often when eating. Patient plans to continue his current diet with awareness of his aspiration risk and adherence to aspiration precautions. He  is interested in outpatient swallowing therapy, with goal of strengthening his swallowing musculature and improving airway protection, if this can be completed closer to home [facility]). We will send a referral through [facility]. Patient was advised to watch for signs of pneumonia including fevers, cough, chest discomfort, etc. Discussed return to clinic/hospital criteria.    Recommendations: Diet: patient plans to continue his current diet - self-selected solids and thin liquids Aspiration precautions:  sit upright during all PO (as close to 90 degrees as possible) take small bites/sips cough/clear throat regularly during the meal Medications: one at a time   Additional Recommendations: Swallowing therapy - outpatient therapy (referral to [facility]) Repeat swallow evaluation - at [facility] in coordination with ENT; interim evaluation per treating theraist Patient should monitor closely for s/s of PNA and seek  medical attention as needed   Prior/Pertinent Swallowing Evaluations: Multiple, most recent- *MBS 09/05/23- Impression: MBS per prior FEES to further assess the esophagus given the intermittent episodes of regurgitation in to the hypopharynx. MBS exam does show a CP bar with trace reflow of bolus residual in the cervical esophagus. An esophageal sweep showed evidence of dysmotility per the Radiologist with reduced clearance and tertiary contractions (see series 11). Given that patient has minimal impact from slight CP bar presence combined with esophageal findings, anticipate UES dilation may not be overly beneficial. Patient in agreement. His oropharyngeal swallow is otherwise stable in appearance. Encourage him to continue with his current diet, adherence to swallow precautions, and home swallow therapy program. We will plan to follow-up with patient on 3/25 in coordination with his Dr. Lauralee appointment. Recommendations: Diet: continue current diet, self-selected solids and thin liquids Aspiration precautions:  sit upright during all PO fully swallow before next bite/sip cough/clear throat intermittently during the meal Medications: one at a time Additional Recommendations:  Swallowing therapy - continue home program Barium esophagram - may consider if esophageal complaints increase SLP follow-up (CSE) 3/25 in coordination with ENT return   *CSE 12/10/23- Impression: Patient seen for follow-up on swallowing in coordination with ENT visit. Patient with stable swallowing function. Reviewed prior swallow evaluation - MBS on 09/05/23. Patient with no additional swallowing concerns at this time. Reviewed rationale for continued swallowing exercises in the post-treatment setting. Encouraged patient to resume home program as recent performance has been limited due to additional health issues. Will plan to repeat patient's swallow evaluation on an annual basis, certainly sooner should additional  concerns arise. Will coordinate with ENT appointments as able.  COGNITION: Overall cognitive status: History of cognitive impairments - at baseline; Seeing Dr. Gregg for Neurology   -Tangential; requires redirection   SUBJECTIVE DYSPHAGIA REPORTS:  Date of onset: ~10 years ago; worsened in 2021 Reported symptoms: coughing with both solids and liquids, xerostomia, and hoarseness  Current diet: regular and thin liquids  Co-morbid voice changes: No  FACTORS WHICH MAY INCREASE RISK OF ADVERSE EVENT IN PRESENCE OF ASPIRATION:  General health: frail or deconditioned  Risk factors: reduced cognitive function, weak cough, and compromised immune system    ORAL MOTOR EXAMINATION: Overall status: WFL Comments: NA  CLINICAL SWALLOW ASSESSMENT:   Dentition: adequate natural dentition; partial bridge Vocal quality at baseline: normal and low vocal intensity Patient directly observed with POs: Yes: thin liquids  Feeding: able to feed self Liquids provided by: bottle [facility] Swallow Protocol: Pt silently aspirating; did not complete as he just had recent FEES Oral phase signs and symptoms: NA Pharyngeal phase signs and symptoms: multiple swallows and immediate throat  clear  PATIENT REPORTED OUTCOME MEASURES (PROM): EAT-10: 31/40 (indicating more severe difficulty)                                                                                                                              TREATMENT DATE:   09/24/23: Pt was seen for skilled ST services targeting dysphagia exercises. Currently, SLP is repeating and reviewing the same information with patient each session due to memory difficulties. Pt reports he has trouble with completing the hard swallow and mendelsohn. Unable to complete mendelsohn with max cueing today.  He reports tongue holds and masako are easy for him to complete. Pt was coughing and throat clearing throughout session (without PO). He reports he is still coughing from PNA in  early December. We reviewed aspiration precautions. Pt will need recertification next session for continued tx to increase understanding and compliance.   *Transferring to [facility], as this SLP will be on maternity leave. SLP assisted pt in making an appointment with this facility.   09/01/24: Pt was seen for skilled ST services targeting dysphagia exercises. He reports he has been trying to do re: effortful swallow, mendelsohn, tongue holds, and masako. Pt reports he had PNA in the hospital that was discovered after his fall. Pt cognition appears to impact his ability to retain information. SLP provided pt with exercise and aspiration PNA handouts. He was instructed to read over each to promote understanding. Pt able to complete tongue exercises independently. He is having difficulty with effortful swallow and mendelsohn. To cont with education to promote compliance.   PATIENT EDUCATION: Education details: Dysphagia, SLP role Person educated: Patient Education method: Explanation Education comprehension: verbalized understanding   ASSESSMENT:  CLINICAL IMPRESSION: Pt is a 84 yo male who presents to ST OP for evaluation post Flexible Endoscopic Evaluation of Swallowing (FEES). Pt has had speech therapy treatment in the past for pharyngeal strengthening exercises. He has not been completing exercises at home. Pt was unable to recall outcome of FEES until reminded by SLP. Pt is silently aspiration and per report, wife notes he is coughing more often while eating. Pt was tangential and required redirection back to questions. Pt was assessed using EAT-10 and was observed with single sips of water  - see above. SLP observed immediate throat clear ~5 throat clears per sip of liquid. Pt was observed to throat clear throughout evaluation in absence AND presence of liquid intake. Pt was able to recall a few of his swallow exercises he has been educated on by previous therapist at [facility]. SLP rec skilled ST  services to address dysphagia. Pt to follow up with ENT/evaluating therapist in a few months.    OBJECTIVE IMPAIRMENTS: include dysphagia. These impairments are limiting patient from safety when swallowing. Factors affecting potential to achieve goals and functional outcome are severity of impairments. Patient will benefit from skilled SLP services to address above impairments and improve overall function.  REHAB POTENTIAL: Fair due to severity  GOALS: Goals reviewed with patient? Yes  SHORT TERM GOALS= LONG TERM GOALS   10/06/23 (due to scheduling)  Pt will demonstrate use of pharyngeal strengthening exercises (tongue holds, effortful swallow, mendelsohn maneuver, CTAR) Baseline: Goal status: PROGRESSING  2.  Pt will recall aspiration precautions independently  Baseline:  Goal status: PROGRESSING  3.  Pt will improve score on PROM Baseline:  Goal status: INITIAL    PLAN:  SLP FREQUENCY: 1x/week  SLP DURATION: 6 weeks  PLANNED INTERVENTIONS: Aspiration precaution training, Pharyngeal strengthening exercises, Diet toleration management , Environmental controls, Trials of upgraded texture/liquids, Cueing hierachy, SLP instruction and feedback, Compensatory strategies, Patient/family education, and [id number] Treatment of swallowing function    [employer], CCC-SLP 09/23/2024, 12:42 PM      "

## 2024-09-30 ENCOUNTER — Ambulatory Visit: Admitting: Speech Pathology

## 2024-09-30 ENCOUNTER — Ambulatory Visit

## 2024-09-30 DIAGNOSIS — R1312 Dysphagia, oropharyngeal phase: Secondary | ICD-10-CM | POA: Diagnosis not present

## 2024-09-30 NOTE — Patient Instructions (Signed)
" ° ° °  Do 30 reps each day, of each exercise  Effortful swallow  Masako (tongue between your teeth)  Tongue hold  Mendelsohn (swallow 1 drop of water  and squeeze tight 3-5 seconds)   "

## 2024-09-30 NOTE — Therapy (Signed)
 " OUTPATIENT SPEECH LANGUAGE PATHOLOGY SWALLOW TREATMENT-RECERTIFICATION   Patient Name: Jason Noble MRN: [id number] DOB:1941/06/17, 84 y.o., male Today's Date: 09/30/2024  PCP: Arloa Elsie SAUNDERS, MD REFERRING PROVIDER: Lauralee Chew, MD  END OF SESSION:  End of Session - 09/30/24 1253     Visit Number 4    Number of Visits 7    Date for Recertification  10/23/24    SLP Start Time 1238   checked in 1235   SLP Stop Time  1316    SLP Time Calculation (min) 38 min    Activity Tolerance Patient tolerated treatment well           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    Basal cell carcinoma of skin    Broken jaw (HCC)    right lower    Cholecystitis    Coronary heart disease    has a stent   CVA (cerebral infarction) 2007   mild   Fatigue 07/26/2011   History of cancer chemotherapy    History of radiation therapy 06/18/2011- 08/03/2011   squamous cell tongue/69.96 Gy 48fx   Hypercholesteremia    under control   Hypertension    Myocardial infarction (HCC)    approx 2008 or 2009   Numbness    right lower    Numbness and tingling in left hand    numbess 2 fingers    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   Peripheral vascular disease    PFO (patent foramen ovale)    Prostate cancer (HCC) 2002   Skin cancer    basal cell, squamous cell   Squamous cell carcinoma of skin    Stroke (HCC)    appro in 08 or 09   Past Surgical History:  Procedure Laterality Date   CATARACT EXTRACTION W/ INTRAOCULAR LENS  IMPLANT, BILATERAL     CHOLECYSTECTOMY  04/2009   3 surgeries involved: lap chole x2 laparotomy   CORONARY ARTERY BYPASS GRAFT  1991   triple   DIRECT LARYNGOSCOPY N/A 02/17/2020   Procedure: DIRECT LARYNGOSCOPY WITH BIOPSY;  Surgeon: Jesus Oliphant, MD;  Location: [facility] OR;  Service: ENT;  Laterality: N/A;   ELBOW SURGERY  42 years ago   removal left radial head due to hockey accident    EXPLORATORY LAPAROTOMY  04/2010   Dr. Tanda (3rd surgery for gall bladder)   INGUINAL HERNIA REPAIR Right 09/23/2014   Procedure: RIGHT INGUINAL HERNIA REPAIR WITH MESH;  Surgeon: Krystal Russell, MD;  Location: [facility] ORS;  Service: General;  Laterality: Right;   INGUINAL HERNIA REPAIR Left 09/30/2015   Procedure: HERNIA REPAIR LEFT INGUINAL ADULT;  Surgeon: Krystal Russell, MD;  Location: [facility] ORS;  Service: General;  Laterality: Left;   INSERTION OF MESH Left 09/30/2015   Procedure: INSERTION OF MESH;  Surgeon: Krystal Russell, MD;  Location: [facility] ORS;  Service: General;  Laterality: Left;   MULTIPLE TOOTH EXTRACTIONS     prior to radiation   PEG TUBE PLACEMENT  06/13/11 at [facility] IR   10/2011 removed   PROSTATECTOMY  2002   TONSILLECTOMY     x2: as child then 1985 for Left lingual tonsil removed   TOTAL HIP ARTHROPLASTY Left 02/08/2016   Procedure: TOTAL HIP ARTHROPLASTY ANTERIOR APPROACH;  Surgeon: Dempsey Sensor, MD;  Location: [facility] OR;  Service: Orthopedics;  Laterality: Left;   ULNAR NERVE TRANSPOSITION Left 10/16/2018   Procedure:  ULNAR NERVE DECOMPRESSION/TRANSPOSITION;  Surgeon: Dozier Soulier, MD;  Location: [facility] OR;  Service: Orthopedics;  Laterality: Left;   Patient Active Problem List   Diagnosis Date Noted   Constipation 08/18/2024   Prediabetes 08/18/2024   Chronic kidney disease, stage 3a (HCC) 08/18/2024   Vitamin D deficiency 08/18/2024   Hypercholesterolemia 08/18/2024   History of malignant neoplasm of tongue 08/18/2024   History of malignant melanoma of skin 08/18/2024   Acute respiratory failure with hypoxia (HCC) 08/18/2024   Normocytic anemia 08/18/2024   CAP (community acquired pneumonia) 08/18/2024   Oropharyngeal dysphagia 06/11/2023   Pulmonary embolism (HCC) 05/17/2020   Anticoagulated 05/17/2020   CAD (coronary artery disease) 03/15/2020   History of CVA (cerebrovascular accident) 03/15/2020   Cancer, 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   History of pharyngeal cancer 12/22/2019    Ulnar neuritis, left 10/16/2018   Primary osteoarthritis of left hip 02/08/2016   Pre-operative cardiovascular examination 01/25/2016   Dilated aortic root 01/25/2016   Essential hypertension 01/25/2016   Hearing deficit 04/27/2014   Dry mouth 04/27/2014   Malignant neoplasm of base of tongue (HCC) 07/27/2011   Fatigue 07/26/2011   Aneurysm of thoracic aorta 09/05/2010   PATENT FORAMEN OVALE 11/28/2009   MYOCARDIAL INFARCTION, HX OF 11/24/2009   Hx of CABG 11/24/2009   PROSTATE CANCER, HX OF 11/24/2009   CEREBROVASCULAR ACCIDENT, HX OF 11/24/2009   Dyslipidemia 11/23/2009   MALAISE AND FATIGUE 11/23/2009   CHOLECYSTECTOMY, HX OF 11/23/2009   Speech Therapy Progress Note  Dates of Reporting Period: 08/05/25 to present  Subjective Statement: Pt has been seen for 4/7 ST sessions focusing on swallowing post ChRT.   Objective: Pt is slowly carrying over HEP procedure but memory challenges have inhibited progress. Wife has been asked to come to therapy sessions to assist pt PRN.  Goal Update: See below.  Plan: See pt for 3-4 more once-weekly sessions to ensure he can particpate in HEP as well as possible. Plan is to also incorporate wife into assisting pt at home with HEP.   Reason Skilled Services are Required: Pt has not yet met full rehab potential.    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 Required SLP redirect back to HEP x3.  Pt accompanied by: self  PERTINENT HISTORY: Per chart review: History of BOT scca treated with CRT in 2012 and newer diagnosis of right supraglottic cancer with laser excision on 03/17/2020 with Dr. Lauralee.     PAIN:  Are you having pain? No  FALLS: Has patient fallen in last 6 months?  Yes;  3   PATIENT GOALS: swallowing  OBJECTIVE:  Note: Objective measures were completed at Evaluation unless otherwise noted. OBJECTIVE:   DIAGNOSTIC FINDINGS: Per EMR  MR BRAIN WO CONTRAST 04/27/24 IMPRESSION: 1. No evidence of acute intracranial abnormality. 2. Moderate chronic microvascular ischemic disease and remote cerebellar and left frontal infarcts. 3. 4.  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     Electronically Signed   By: Gilmore GORMAN Molt M.D.   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 Dysphagia is characterized by reduced bolus propulsion, incomplete airway protection, and reduced laryngeal sensation. There has been a  decline in patient's swallowing function since last seen with increased aspiration events with thin liquids. Patient stopped completing his swallowing exercises several months ago. Patient was educated on risks and adverse outcomes associated with dysphagia and aspiration. The relative risks and benefits of management options were reviewed. Patient denies PNA or respiratory challenges since last seen, although his wife notes he is coughing more often when eating. Patient plans to continue his current diet with awareness of his aspiration risk and adherence to aspiration precautions. He is interested in outpatient swallowing therapy, with goal of strengthening his swallowing musculature and improving airway protection, if this can be completed closer to home [facility]). We will send a referral through [facility]. Patient was advised to watch for signs of pneumonia including fevers, cough, chest discomfort, etc. Discussed return to clinic/hospital criteria.    Recommendations: Diet: patient plans to continue his current diet - self-selected solids and thin  liquids Aspiration precautions:  sit upright during all PO (as close to 90 degrees as possible) take small bites/sips cough/clear throat regularly during the meal Medications: one at a time   Additional Recommendations: Swallowing therapy - outpatient therapy (referral to [facility]) Repeat swallow evaluation - at [facility] in coordination with ENT; interim evaluation per treating theraist Patient should monitor closely for s/s of PNA and seek medical attention as needed   Prior/Pertinent Swallowing Evaluations: Multiple, most recent- *MBS 09/05/23- Impression: MBS per prior FEES to further assess the esophagus given the intermittent episodes of regurgitation in to the hypopharynx. MBS exam does show a CP bar with trace reflow of bolus residual in the cervical esophagus. An esophageal sweep showed evidence of dysmotility per the Radiologist with reduced clearance and tertiary contractions (see series 11). Given that patient has minimal impact from slight CP bar presence combined with esophageal findings, anticipate UES dilation may not be overly beneficial. Patient in agreement. His oropharyngeal swallow is otherwise stable in appearance. Encourage him to continue with his current diet, adherence to swallow precautions, and home swallow therapy program. We will plan to follow-up with patient on 3/25 in coordination with his Dr. Lauralee appointment. Recommendations: Diet: continue current diet, self-selected solids and thin liquids Aspiration precautions:  sit upright during all PO fully swallow before next bite/sip cough/clear throat intermittently during the meal Medications: one at a time Additional Recommendations:  Swallowing therapy - continue home program Barium esophagram - may consider if esophageal complaints increase SLP follow-up (CSE) 3/25 in coordination with ENT return   *CSE 12/10/23- Impression: Patient seen for follow-up on swallowing in coordination with ENT visit. Patient  with stable swallowing function. Reviewed prior swallow evaluation - MBS on 09/05/23. Patient with no additional swallowing concerns at this time. Reviewed rationale for continued swallowing exercises in the post-treatment setting. Encouraged patient to resume home program as recent performance has been limited due to additional health issues. Will plan to repeat patient's swallow evaluation on an annual basis, certainly sooner should additional concerns arise. Will coordinate with ENT appointments as able.  COGNITION: Overall cognitive status: History of cognitive impairments - at baseline; Seeing Dr. Gregg for Neurology   -Tangential; requires redirection   SUBJECTIVE DYSPHAGIA REPORTS:  Date of onset: ~10 years ago; worsened in 2021 Reported symptoms: coughing with both solids and liquids, xerostomia, and hoarseness  Current diet: regular and thin liquids  Co-morbid voice changes: No  FACTORS WHICH MAY INCREASE RISK OF ADVERSE EVENT IN PRESENCE OF ASPIRATION:  General health: frail or deconditioned  Risk factors: reduced cognitive function, weak  cough, and compromised immune system    ORAL MOTOR EXAMINATION: Overall status: WFL Comments: NA  CLINICAL SWALLOW ASSESSMENT:   Dentition: adequate natural dentition; partial bridge Vocal quality at baseline: normal and low vocal intensity Patient directly observed with POs: Yes: thin liquids  Feeding: able to feed self Liquids provided by: bottle [facility] Swallow Protocol: Pt silently aspirating; did not complete as he just had recent FEES Oral phase signs and symptoms: NA Pharyngeal phase signs and symptoms: multiple swallows and immediate throat clear  PATIENT REPORTED OUTCOME MEASURES (PROM): EAT-10: 31/40 (indicating more severe difficulty)                                                                                                                              TREATMENT DATE:   CTAR=Chin tuck against resistance  10/01/23: Pt  tells SLP his current HEP is tongue hold, effortful swallow, Mendelsohn, and Masako. SLP checked with pt's former primary SLP (Hershcel) who stated HEP was Masako, Claudette, effortful swallow, and CTAR. Rich stated he needed another handout telling him what his HEP was as he has misplaced the one provided by SLP-Herschel. Pt was seen for skilled ST services targeting dysphagia exercises. Pt reports he ate chicken cacciatore for lunch recently. SLP reviewed pt's HEP with him - he req'd max A with Mendelsohn and pt appeared to successfully do exercise 3/5 attempts. SLP to monitor. Independent with effortful swallow. Pt performed tongue hold by putting tongue to roof of mouth, and successfully completed Masako. SLP will ensure pt understands tongue hold is not tongue press exercise, and that he has CTAR on his HEP and understands how to complete. SLP told pt that his wife needs to accompany him next session so she can A PRN at home.   09/24/23: Pt was seen for skilled ST services targeting dysphagia exercises. Currently, SLP is repeating and reviewing the same information with patient each session due to memory difficulties. Pt reports he has trouble with completing the hard swallow and mendelsohn. Unable to complete mendelsohn with max cueing today.  He reports tongue holds and masako are easy for him to complete. Pt was coughing and throat clearing throughout session (without PO). He reports he is still coughing from PNA in early December. We reviewed aspiration precautions. Pt will need recertification next session for continued tx to increase understanding and compliance.   *Transferring to [facility], as this SLP will be on maternity leave. SLP assisted pt in making an appointment with this facility.   09/01/24: Pt was seen for skilled ST services targeting dysphagia exercises. He reports he has been trying to do re: effortful swallow, mendelsohn, tongue holds, and masako. Pt reports he had PNA in  the hospital that was discovered after his fall. Pt cognition appears to impact his ability to retain information. SLP provided pt with exercise and aspiration PNA handouts. He was instructed to read over each to promote understanding. Pt able to complete tongue exercises independently.  He is having difficulty with effortful swallow and mendelsohn. To cont with education to promote compliance.   PATIENT EDUCATION: Education details: see treatment date above corresponding to today's date Person educated: Patient Education method: Explanation, Demonstration, Verbal cues, and Handouts Education comprehension: verbalized understanding, returned demonstration, verbal cues required, and needs further education   ASSESSMENT:  CLINICAL IMPRESSION: RECERT TODAY. Modified LTG #2, due to pt's memory deficits. Pt is a 84 yo male who presents to ST OP for treatment post Flexible Endoscopic Evaluation of Swallowing (FEES). See treatment date for details of today's session. Pt was very tangential in conversation today during session. On eval date pt revealed he had speech therapy treatment in the past for pharyngeal strengthening exercises and had not been completing exercises at home. Pt was unable to recall outcome of FEES until reminded by SLP. Pt is silently aspiration and per report, wife notes he is coughing more often while eating. Pt was tangential and required redirection back to questions. Pt was assessed using EAT-10 and was observed with single sips of water  - see above. SLP observed immediate throat clear ~5 throat clears per sip of liquid. Pt was observed to throat clear throughout evaluation in absence AND presence of liquid intake. Pt was able to recall a few of his swallow exercises he has been educated on by previous therapist at [facility].  SLP rec skilled ST services to address dysphagia. Pt to follow up with ENT/evaluating therapist in a few months.    OBJECTIVE IMPAIRMENTS: include dysphagia.  These impairments are limiting patient from safety when swallowing. Factors affecting potential to achieve goals and functional outcome are severity of impairments. Patient will benefit from skilled SLP services to address above impairments and improve overall function.  REHAB POTENTIAL: Fair due to severity    GOALS: Goals reviewed with patient? Yes  SHORT TERM GOALS= LONG TERM GOALS    11/06/24 (Due to scheduling)  Pt will demonstrate use of pharyngeal strengthening exercises (tongue holds, effortful swallow, mendelsohn maneuver, CTAR) Baseline: Goal status: PROGRESSING  2.  Pt will recall aspiration precautions witih mod I Baseline:  Goal status: modified  3.  Pt will improve score on PROM Baseline:  Goal status: INITIAL    PLAN:  SLP FREQUENCY: 1x/week  SLP DURATION: 7 sessions total  PLANNED INTERVENTIONS: Aspiration precaution training, Pharyngeal strengthening exercises, Diet toleration management , Environmental controls, Trials of upgraded texture/liquids, Cueing hierachy, SLP instruction and feedback, Compensatory strategies, Patient/family education, and [id number] Treatment of swallowing function    Addysen Louth, CCC-SLP 09/30/2024, 5:52 PM      "

## 2024-10-09 ENCOUNTER — Other Ambulatory Visit: Payer: Self-pay | Admitting: Family Medicine

## 2024-10-09 DIAGNOSIS — R9389 Abnormal findings on diagnostic imaging of other specified body structures: Secondary | ICD-10-CM

## 2024-10-21 ENCOUNTER — Ambulatory Visit

## 2024-10-21 DIAGNOSIS — R1312 Dysphagia, oropharyngeal phase: Secondary | ICD-10-CM

## 2024-10-21 NOTE — Patient Instructions (Addendum)
" ° °  SWALLOWING EXERCISES Do these at least 5 days/week until March 6th, then 2 days per week afterwards You can use 1-2 drops of liquid to help you swallow, if your mouth gets dry  Effortful Swallows - Swallow as hard as you can - Do at least 30 reps/day, in 3 sets of 10  Swallow with your tongue sticking out (Masako Swallow)  - Stick tongue out past your lips and gently bite tongue with your teeth - Swallow, while holding your tongue with your teeth - Do at least 30 reps/day, in 3 sets of 10    Swallow and squeeze Event Organiser) - Put your finger on your Adam's apple and swallow - Hold that squeeze to keep your Adam's Apple up for 5 seconds - Do at least 30 reps/day, in 3 sets of 10  4   Chin tuck - Place a rolled up towel (4 inches wide) under your chin near your neck - Tuck your chin and push hard on the towel for 5 seconds - Do at least 30 reps/day, in 3 sets of 10   When you eat make SURE to: sit upright during all PO (as close to 90 degrees as possible) take small bites/sips cough/clear throat regularly, and then swallow "

## 2024-10-21 NOTE — Therapy (Signed)
 " OUTPATIENT SPEECH LANGUAGE PATHOLOGY SWALLOW TREATMENT   Patient Name: Jason Noble MRN: [id number] DOB:December 15, 1940, 84 y.o., male Today's Date: 10/21/2024  PCP: Arloa Elsie SAUNDERS, MD REFERRING PROVIDER: Lauralee Chew, MD  END OF SESSION:  End of Session - 10/21/24 1414     Visit Number 5    Number of Visits 7    Date for Recertification  11/06/24   due to scheduling   SLP Start Time 1408   arrived 1406   SLP Stop Time  1445    SLP Time Calculation (min) 37 min    Activity Tolerance Patient tolerated treatment well           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    Basal cell carcinoma of skin    Broken jaw (HCC)    right lower    Cholecystitis    Coronary heart disease    has a stent   CVA (cerebral infarction) 2007   mild   Fatigue 07/26/2011   History of cancer chemotherapy    History of radiation therapy 06/18/2011- 08/03/2011   squamous cell tongue/69.96 Gy 74fx   Hypercholesteremia    under control   Hypertension    Myocardial infarction (HCC)    approx 2008 or 2009   Numbness    right lower    Numbness and tingling in left hand    numbess 2 fingers    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   Peripheral vascular disease    PFO (patent foramen ovale)    Prostate cancer (HCC) 2002   Skin cancer    basal cell, squamous cell   Squamous cell carcinoma of skin    Stroke (HCC)    appro in 08 or 09   Past Surgical History:  Procedure Laterality Date   CATARACT EXTRACTION W/ INTRAOCULAR LENS  IMPLANT, BILATERAL     CHOLECYSTECTOMY  04/2009   3 surgeries involved: lap chole x2 laparotomy   CORONARY ARTERY BYPASS GRAFT  1991   triple   DIRECT LARYNGOSCOPY N/A 02/17/2020   Procedure: DIRECT LARYNGOSCOPY WITH BIOPSY;  Surgeon: Jesus Oliphant, MD;  Location: [facility] OR;  Service: ENT;  Laterality: N/A;   ELBOW SURGERY  42 years ago   removal left radial head due to hockey accident    EXPLORATORY LAPAROTOMY  04/2010   Dr. Tanda (3rd surgery for gall bladder)   INGUINAL HERNIA REPAIR Right 09/23/2014   Procedure: RIGHT INGUINAL HERNIA REPAIR WITH MESH;  Surgeon: Krystal Russell, MD;  Location: [facility] ORS;  Service: General;  Laterality: Right;   INGUINAL HERNIA REPAIR Left 09/30/2015   Procedure: HERNIA REPAIR LEFT INGUINAL ADULT;  Surgeon: Krystal Russell, MD;  Location: [facility] ORS;  Service: General;  Laterality: Left;   INSERTION OF MESH Left 09/30/2015   Procedure: INSERTION OF MESH;  Surgeon: Krystal Russell, MD;  Location: [facility] ORS;  Service: General;  Laterality: Left;   MULTIPLE TOOTH EXTRACTIONS     prior to radiation   PEG TUBE PLACEMENT  06/13/11 at [facility]   10/2011 removed   PROSTATECTOMY  2002   TONSILLECTOMY     x2: as child then 1985 for Left lingual tonsil removed   TOTAL HIP ARTHROPLASTY Left 02/08/2016   Procedure: TOTAL HIP ARTHROPLASTY ANTERIOR APPROACH;  Surgeon: Dempsey Sensor, MD;  Location: [facility] OR;  Service: Orthopedics;  Laterality: Left;   ULNAR NERVE TRANSPOSITION Left 10/16/2018  Procedure: ULNAR NERVE DECOMPRESSION/TRANSPOSITION;  Surgeon: Dozier Soulier, MD;  Location: [facility] OR;  Service: Orthopedics;  Laterality: Left;   Patient Active Problem List   Diagnosis Date Noted   Constipation 08/18/2024   Prediabetes 08/18/2024   Chronic kidney disease, stage 3a (HCC) 08/18/2024   Vitamin D deficiency 08/18/2024   Hypercholesterolemia 08/18/2024   History of malignant neoplasm of tongue 08/18/2024   History of malignant melanoma of skin 08/18/2024   Acute respiratory failure with hypoxia (HCC) 08/18/2024   Normocytic anemia 08/18/2024   CAP (community acquired pneumonia) 08/18/2024   Oropharyngeal dysphagia 06/11/2023   Pulmonary embolism (HCC) 05/17/2020   Anticoagulated 05/17/2020   CAD (coronary artery disease) 03/15/2020   History of CVA (cerebrovascular accident) 03/15/2020   Cancer, 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   History of pharyngeal cancer 12/22/2019    Ulnar neuritis, left 10/16/2018   Primary osteoarthritis of left hip 02/08/2016   Pre-operative cardiovascular examination 01/25/2016   Dilated aortic root 01/25/2016   Essential hypertension 01/25/2016   Hearing deficit 04/27/2014   Dry mouth 04/27/2014   Malignant neoplasm of base of tongue (HCC) 07/27/2011   Fatigue 07/26/2011   Aneurysm of thoracic aorta 09/05/2010   PATENT FORAMEN OVALE 11/28/2009   MYOCARDIAL INFARCTION, HX OF 11/24/2009   Hx of CABG 11/24/2009   PROSTATE CANCER, HX OF 11/24/2009   CEREBROVASCULAR ACCIDENT, HX OF 11/24/2009   Dyslipidemia 11/23/2009   MALAISE AND FATIGUE 11/23/2009   CHOLECYSTECTOMY, HX OF 11/23/2009    ONSET DATE: Referred on 07/02/24   REFERRING DIAG: 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  Pt accompanied by: self  PERTINENT HISTORY: Per chart review: History of BOT scca treated with CRT in 2012 and newer diagnosis of right supraglottic cancer with laser excision on 03/17/2020 with Dr. Lauralee.     PAIN:  Are you having pain? No  FALLS: Has patient fallen in last 6 months?  Yes; 3   PATIENT GOALS: swallowing  OBJECTIVE:  Note: Objective measures were completed at Evaluation unless otherwise noted. OBJECTIVE:   DIAGNOSTIC FINDINGS: Per EMR  MR BRAIN WO CONTRAST 04/27/24 IMPRESSION: 1. No evidence of acute intracranial abnormality. 2. Moderate chronic microvascular ischemic disease and remote cerebellar and left frontal infarcts. 3. 4.  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     Electronically Signed   By: Gilmore GORMAN Molt M.D.   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 Dysphagia is characterized by reduced bolus propulsion, incomplete airway protection, and reduced laryngeal sensation. There has been a decline in patient's swallowing function since last seen with increased aspiration events with thin liquids. Patient stopped completing his swallowing exercises several months ago. Patient was educated on risks and adverse outcomes associated with dysphagia and aspiration. The relative risks and benefits of management options were reviewed. Patient denies PNA or respiratory challenges since last seen, although his wife notes he is coughing more often when eating. Patient plans to continue his current diet with awareness of his aspiration risk and adherence to aspiration precautions. He is interested in outpatient swallowing therapy, with goal of strengthening his swallowing musculature and improving airway protection, if this can be  completed closer to home [facility]). We will send a referral through [facility]. Patient was advised to watch for signs of pneumonia including fevers, cough, chest discomfort, etc. Discussed return to clinic/hospital criteria.    Recommendations: Diet: patient plans to continue his current diet - self-selected solids and thin liquids Aspiration precautions:  sit upright during all PO (as close to 90 degrees as possible) take small bites/sips cough/clear throat regularly during the meal Medications: one at a time   Additional Recommendations: Swallowing therapy - outpatient therapy (referral to [facility]) Repeat swallow evaluation - at [facility] in coordination with ENT; interim evaluation per treating theraist Patient should monitor closely for s/s of PNA and seek medical attention as needed   Prior/Pertinent Swallowing Evaluations: Multiple, most recent- *MBS 09/05/23- Impression: MBS per prior FEES to further  assess the esophagus given the intermittent episodes of regurgitation in to the hypopharynx. MBS exam does show a CP bar with trace reflow of bolus residual in the cervical esophagus. An esophageal sweep showed evidence of dysmotility per the Radiologist with reduced clearance and tertiary contractions (see series 11). Given that patient has minimal impact from slight CP bar presence combined with esophageal findings, anticipate UES dilation may not be overly beneficial. Patient in agreement. His oropharyngeal swallow is otherwise stable in appearance. Encourage him to continue with his current diet, adherence to swallow precautions, and home swallow therapy program. We will plan to follow-up with patient on 3/25 in coordination with his Dr. Lauralee appointment. Recommendations: Diet: continue current diet, self-selected solids and thin liquids Aspiration precautions:  sit upright during all PO fully swallow before next bite/sip cough/clear throat intermittently during the meal Medications: one at a time Additional Recommendations:  Swallowing therapy - continue home program Barium esophagram - may consider if esophageal complaints increase SLP follow-up (CSE) 3/25 in coordination with ENT return   *CSE 12/10/23- Impression: Patient seen for follow-up on swallowing in coordination with ENT visit. Patient with stable swallowing function. Reviewed prior swallow evaluation - MBS on 09/05/23. Patient with no additional swallowing concerns at this time. Reviewed rationale for continued swallowing exercises in the post-treatment setting. Encouraged patient to resume home program as recent performance has been limited due to additional health issues. Will plan to repeat patient's swallow evaluation on an annual basis, certainly sooner should additional concerns arise. Will coordinate with ENT appointments as able.  COGNITION: Overall cognitive status: History of cognitive impairments - at baseline; Seeing  Dr. Gregg for Neurology   -Tangential; requires redirection   SUBJECTIVE DYSPHAGIA REPORTS:  Date of onset: ~10 years ago; worsened in 2021 Reported symptoms: coughing with both solids and liquids, xerostomia, and hoarseness  Current diet: regular and thin liquids  Co-morbid voice changes: No  FACTORS WHICH MAY INCREASE RISK OF ADVERSE EVENT IN PRESENCE OF ASPIRATION:  General health: frail or deconditioned  Risk factors: reduced cognitive function, weak cough, and compromised immune system    ORAL MOTOR EXAMINATION: Overall status: WFL Comments: NA  CLINICAL SWALLOW ASSESSMENT:   Dentition: adequate natural dentition; partial bridge Vocal quality at baseline: normal and low vocal intensity Patient directly observed with POs: Yes: thin liquids  Feeding: able to feed self Liquids provided by: bottle [facility] Swallow Protocol: Pt silently aspirating; did not complete as he just had recent FEES Oral phase signs and symptoms: NA Pharyngeal phase signs and symptoms: multiple swallows and immediate throat clear  PATIENT REPORTED OUTCOME MEASURES (PROM): EAT-10: 31/40 (indicating more severe difficulty)  TREATMENT DATE:   CTAR=Chin tuck against resistance  10/21/24: Pt did not bring wife today as requested. SLP requested she come next session to assist pt PRN, due to his memory deficits. SLP reviewed pt's aspiration precautions with him and provided handout with these (see pt instructions). Pt told SLP these with extra time immediately after reviewing with him.  SLP reviewed pt's HEP with him (see pt instructions). CTAR was re-introduced with a towel rolled 4 inches in diameter as the resistance piece. Pt was independent by task end. Rich req'd max cues initially for [facility] but was independent by task end. Approx 7 minutes was spent on this exercise with SLP  teaching pt the proper procedure. Masako was completed with independence, and effortful swallow was completed with demo and initial min A, faded to independent. SLP reiterated Claudette procedure with pt at end of session and pt req'd mod cues to perform correctly but was again independent after 3 reps. SLP concerned pt will not recall proper procedure once returning home. SLP again requested when walking out of ST room that wife attend with pt next week.  10/01/23: Pt tells SLP his current HEP is tongue hold, effortful swallow, Mendelsohn, and Masako. SLP checked with pt's former primary SLP (Hershcel) who stated HEP was Masako, Claudette, effortful swallow, and CTAR. Rich stated he needed another handout telling him what his HEP was as he has misplaced the one provided by SLP-Herschel. Pt was seen for skilled ST services targeting dysphagia exercises. Pt reports he ate chicken cacciatore for lunch recently. SLP reviewed pt's HEP with him - he req'd max A with Mendelsohn and pt appeared to successfully do exercise 3/5 attempts. SLP to monitor. Independent with effortful swallow. Pt performed tongue hold by putting tongue to roof of mouth, and successfully completed Masako. SLP will ensure pt understands tongue hold is not tongue press exercise, and that he has CTAR on his HEP and understands how to complete. SLP told pt that his wife needs to accompany him next session so she can A PRN at home.   09/24/23: Pt was seen for skilled ST services targeting dysphagia exercises. Currently, SLP is repeating and reviewing the same information with patient each session due to memory difficulties. Pt reports he has trouble with completing the hard swallow and mendelsohn. Unable to complete mendelsohn with max cueing today.  He reports tongue holds and masako are easy for him to complete. Pt was coughing and throat clearing throughout session (without PO). He reports he is still coughing from PNA in early December.  We reviewed aspiration precautions. Pt will need recertification next session for continued tx to increase understanding and compliance.   *Transferring to [facility], as this SLP will be on maternity leave. SLP assisted pt in making an appointment with this facility.   09/01/24: Pt was seen for skilled ST services targeting dysphagia exercises. He reports he has been trying to do re: effortful swallow, mendelsohn, tongue holds, and masako. Pt reports he had PNA in the hospital that was discovered after his fall. Pt cognition appears to impact his ability to retain information. SLP provided pt with exercise and aspiration PNA handouts. He was instructed to read over each to promote understanding. Pt able to complete tongue exercises independently. He is having difficulty with effortful swallow and mendelsohn. To cont with education to promote compliance.   PATIENT EDUCATION: Education details: see treatment date above corresponding to today's date Person educated: Patient Education method: Explanation, Demonstration, Verbal cues, and Handouts Education  comprehension: verbalized understanding, returned demonstration, verbal cues required, and needs further education   ASSESSMENT:  CLINICAL IMPRESSION: SLP modified LTG 1 due to pt's memory deficits. Pt is a 84 yo male who presents to ST OP for treatment post Flexible Endoscopic Evaluation of Swallowing (FEES) in October 2025. See treatment date for details of today's session. Pt was slightly tangential in conversation today during session and was redirected easily back to HEP completion. On eval date pt revealed he had speech therapy treatment in the past for pharyngeal strengthening exercises and had not been completing exercises at home. Pt was unable to recall outcome of FEES until reminded by SLP. Pt is silently aspiration and per report, wife notes he is coughing more often while eating. Pt was tangential and required redirection back to  questions. Pt was assessed using EAT-10 and was observed with single sips of water  - see above. SLP observed immediate throat clear ~5 throat clears per sip of liquid. Pt was observed to throat clear throughout evaluation in absence AND presence of liquid intake. Pt was able to recall a few of his swallow exercises he has been educated on by previous therapist at [facility].  SLP rec skilled ST services to address dysphagia. Pt to follow up with ENT/evaluating therapist in a few months.    OBJECTIVE IMPAIRMENTS: include dysphagia. These impairments are limiting patient from safety when swallowing. Factors affecting potential to achieve goals and functional outcome are severity of impairments. Patient will benefit from skilled SLP services to address above impairments and improve overall function.  REHAB POTENTIAL: Fair due to severity    GOALS: Goals reviewed with patient? Yes  SHORT TERM GOALS= LONG TERM GOALS    11/06/24 (Due to scheduling)  Pt will demonstrate use of pharyngeal strengthening exercises (Masako, effortful swallow, mendelsohn maneuver, CTAR) with mod I Baseline: Goal status: PROGRESSING  2.  Pt will recall aspiration precautions witih mod I Baseline:  Goal status: modified  3.  Pt will improve score on PROM Baseline:  Goal status: INITIAL    PLAN:  SLP FREQUENCY: 1x/week  SLP DURATION: 7 sessions total  PLANNED INTERVENTIONS: Aspiration precaution training, Pharyngeal strengthening exercises, Diet toleration management , Environmental controls, Trials of upgraded texture/liquids, Cueing hierachy, SLP instruction and feedback, Compensatory strategies, Patient/family education, and [id number] Treatment of swallowing function    Soren Lazarz, CCC-SLP 10/21/2024, 2:15 PM      "

## 2024-10-28 ENCOUNTER — Other Ambulatory Visit

## 2024-10-29 ENCOUNTER — Ambulatory Visit

## 2024-11-05 ENCOUNTER — Ambulatory Visit

## 2024-11-12 ENCOUNTER — Ambulatory Visit
